# Patient Record
Sex: Female | Born: 1935 | Race: White | Hispanic: No | Smoking: Never smoker
Health system: Southern US, Community
[De-identification: ages and names within clinical notes are randomized; demographics above are authoritative.]

## PROBLEM LIST (undated history)

## (undated) DIAGNOSIS — E785 Hyperlipidemia, unspecified: Secondary | ICD-10-CM

## (undated) DIAGNOSIS — I739 Peripheral vascular disease, unspecified: Secondary | ICD-10-CM

## (undated) DIAGNOSIS — I2510 Atherosclerotic heart disease of native coronary artery without angina pectoris: Secondary | ICD-10-CM

## (undated) DIAGNOSIS — M1990 Unspecified osteoarthritis, unspecified site: Secondary | ICD-10-CM

## (undated) DIAGNOSIS — IMO0002 Reserved for concepts with insufficient information to code with codable children: Secondary | ICD-10-CM

## (undated) DIAGNOSIS — K7689 Other specified diseases of liver: Secondary | ICD-10-CM

## (undated) DIAGNOSIS — M549 Dorsalgia, unspecified: Secondary | ICD-10-CM

## (undated) DIAGNOSIS — I480 Paroxysmal atrial fibrillation: Secondary | ICD-10-CM

## (undated) DIAGNOSIS — A419 Sepsis, unspecified organism: Secondary | ICD-10-CM

## (undated) DIAGNOSIS — L0390 Cellulitis, unspecified: Secondary | ICD-10-CM

## (undated) DIAGNOSIS — N184 Chronic kidney disease, stage 4 (severe): Secondary | ICD-10-CM

## (undated) DIAGNOSIS — K222 Esophageal obstruction: Secondary | ICD-10-CM

## (undated) DIAGNOSIS — I890 Lymphedema, not elsewhere classified: Secondary | ICD-10-CM

## (undated) DIAGNOSIS — G8929 Other chronic pain: Secondary | ICD-10-CM

## (undated) DIAGNOSIS — D649 Anemia, unspecified: Secondary | ICD-10-CM

## (undated) DIAGNOSIS — I5032 Chronic diastolic (congestive) heart failure: Secondary | ICD-10-CM

## (undated) DIAGNOSIS — I10 Essential (primary) hypertension: Secondary | ICD-10-CM

## (undated) DIAGNOSIS — E1140 Type 2 diabetes mellitus with diabetic neuropathy, unspecified: Secondary | ICD-10-CM

## (undated) DIAGNOSIS — M5430 Sciatica, unspecified side: Secondary | ICD-10-CM

## (undated) DIAGNOSIS — C439 Malignant melanoma of skin, unspecified: Secondary | ICD-10-CM

## (undated) HISTORY — PX: ABSCESS DRAINAGE: SHX1119

## (undated) HISTORY — DX: Sciatica, unspecified side: M54.30

## (undated) HISTORY — DX: Essential (primary) hypertension: I10

## (undated) HISTORY — DX: Paroxysmal atrial fibrillation: I48.0

## (undated) HISTORY — DX: Chronic diastolic (congestive) heart failure: I50.32

## (undated) HISTORY — DX: Chronic kidney disease, stage 4 (severe): N18.4

## (undated) HISTORY — DX: Cellulitis, unspecified: L03.90

## (undated) HISTORY — DX: Sepsis, unspecified organism: A41.9

## (undated) HISTORY — DX: Other specified diseases of liver: K76.89

## (undated) HISTORY — DX: Atherosclerotic heart disease of native coronary artery without angina pectoris: I25.10

## (undated) HISTORY — PX: CARDIAC CATHETERIZATION: SHX172

## (undated) HISTORY — DX: Esophageal obstruction: K22.2

## (undated) HISTORY — DX: Type 2 diabetes mellitus with diabetic neuropathy, unspecified: E11.40

## (undated) HISTORY — DX: Hyperlipidemia, unspecified: E78.5

## (undated) HISTORY — PX: OTHER SURGICAL HISTORY: SHX169

## (undated) HISTORY — DX: Unspecified osteoarthritis, unspecified site: M19.90

## (undated) HISTORY — DX: Malignant melanoma of skin, unspecified: C43.9

## (undated) HISTORY — DX: Reserved for concepts with insufficient information to code with codable children: IMO0002

## (undated) SURGERY — CARDIOVERSION
Anesthesia: Monitor Anesthesia Care

---

## 1980-08-06 HISTORY — PX: TUBAL LIGATION: SHX77

## 1987-08-07 HISTORY — PX: OTHER SURGICAL HISTORY: SHX169

## 1992-08-06 HISTORY — PX: ELBOW SURGERY: SHX618

## 1998-06-22 ENCOUNTER — Ambulatory Visit (HOSPITAL_COMMUNITY): Admission: RE | Admit: 1998-06-22 | Discharge: 1998-06-22 | Payer: Self-pay | Admitting: *Deleted

## 1998-07-12 ENCOUNTER — Ambulatory Visit (HOSPITAL_COMMUNITY): Admission: RE | Admit: 1998-07-12 | Discharge: 1998-07-12 | Payer: Self-pay | Admitting: *Deleted

## 1998-07-21 ENCOUNTER — Encounter: Admission: RE | Admit: 1998-07-21 | Discharge: 1998-10-07 | Payer: Self-pay | Admitting: Anesthesiology

## 1998-10-07 ENCOUNTER — Encounter: Admission: RE | Admit: 1998-10-07 | Discharge: 1999-01-05 | Payer: Self-pay | Admitting: Anesthesiology

## 1999-01-05 ENCOUNTER — Encounter: Admission: RE | Admit: 1999-01-05 | Discharge: 1999-03-30 | Payer: Self-pay | Admitting: Anesthesiology

## 1999-03-30 ENCOUNTER — Encounter: Admission: RE | Admit: 1999-03-30 | Discharge: 1999-06-28 | Payer: Self-pay | Admitting: Anesthesiology

## 1999-07-24 ENCOUNTER — Encounter: Admission: RE | Admit: 1999-07-24 | Discharge: 1999-10-22 | Payer: Self-pay | Admitting: Anesthesiology

## 1999-11-13 ENCOUNTER — Encounter: Admission: RE | Admit: 1999-11-13 | Discharge: 2000-01-15 | Payer: Self-pay | Admitting: Anesthesiology

## 2000-01-15 ENCOUNTER — Encounter: Admission: RE | Admit: 2000-01-15 | Discharge: 2000-04-14 | Payer: Self-pay | Admitting: Anesthesiology

## 2000-05-03 ENCOUNTER — Encounter: Admission: RE | Admit: 2000-05-03 | Discharge: 2000-08-01 | Payer: Self-pay | Admitting: Anesthesiology

## 2000-08-23 ENCOUNTER — Encounter: Admission: RE | Admit: 2000-08-23 | Discharge: 2000-11-21 | Payer: Self-pay | Admitting: Anesthesiology

## 2000-12-13 ENCOUNTER — Encounter: Admission: RE | Admit: 2000-12-13 | Discharge: 2001-03-13 | Payer: Self-pay | Admitting: Anesthesiology

## 2001-02-19 ENCOUNTER — Other Ambulatory Visit: Admission: RE | Admit: 2001-02-19 | Discharge: 2001-02-19 | Payer: Self-pay | Admitting: Obstetrics and Gynecology

## 2005-08-06 HISTORY — PX: CORONARY ANGIOPLASTY: SHX604

## 2005-10-19 ENCOUNTER — Ambulatory Visit (HOSPITAL_COMMUNITY): Admission: RE | Admit: 2005-10-19 | Discharge: 2005-10-19 | Payer: Self-pay | Admitting: Anesthesiology

## 2006-02-01 ENCOUNTER — Inpatient Hospital Stay (HOSPITAL_COMMUNITY): Admission: EM | Admit: 2006-02-01 | Discharge: 2006-02-09 | Payer: Self-pay | Admitting: Internal Medicine

## 2006-02-01 ENCOUNTER — Ambulatory Visit: Payer: Self-pay | Admitting: *Deleted

## 2006-02-04 ENCOUNTER — Encounter: Payer: Self-pay | Admitting: Cardiology

## 2006-02-05 ENCOUNTER — Encounter: Payer: Self-pay | Admitting: Cardiology

## 2006-03-05 ENCOUNTER — Ambulatory Visit: Payer: Self-pay | Admitting: Cardiology

## 2006-03-11 ENCOUNTER — Ambulatory Visit: Payer: Self-pay | Admitting: Cardiology

## 2006-03-11 ENCOUNTER — Encounter: Payer: Self-pay | Admitting: Cardiology

## 2006-03-11 ENCOUNTER — Ambulatory Visit: Payer: Self-pay

## 2006-03-19 ENCOUNTER — Ambulatory Visit: Payer: Self-pay

## 2006-05-08 ENCOUNTER — Ambulatory Visit: Payer: Self-pay | Admitting: Cardiology

## 2006-08-19 ENCOUNTER — Ambulatory Visit: Payer: Self-pay | Admitting: Cardiology

## 2006-12-24 ENCOUNTER — Ambulatory Visit: Payer: Self-pay | Admitting: Cardiology

## 2007-06-23 ENCOUNTER — Ambulatory Visit: Payer: Self-pay | Admitting: Cardiology

## 2007-12-31 ENCOUNTER — Ambulatory Visit: Payer: Self-pay | Admitting: Cardiology

## 2009-01-02 DIAGNOSIS — E785 Hyperlipidemia, unspecified: Secondary | ICD-10-CM

## 2009-01-02 DIAGNOSIS — I872 Venous insufficiency (chronic) (peripheral): Secondary | ICD-10-CM

## 2009-01-04 ENCOUNTER — Ambulatory Visit: Payer: Self-pay | Admitting: Cardiology

## 2009-01-10 ENCOUNTER — Encounter: Payer: Self-pay | Admitting: Cardiology

## 2009-01-18 ENCOUNTER — Ambulatory Visit: Payer: Self-pay

## 2009-01-18 ENCOUNTER — Ambulatory Visit: Payer: Self-pay | Admitting: Cardiology

## 2009-01-20 ENCOUNTER — Ambulatory Visit: Payer: Self-pay | Admitting: Cardiology

## 2009-01-21 LAB — CONVERTED CEMR LAB
BUN: 27 mg/dL — ABNORMAL HIGH (ref 6–23)
Calcium: 9.2 mg/dL (ref 8.4–10.5)
Creatinine, Ser: 1.3 mg/dL — ABNORMAL HIGH (ref 0.4–1.2)
GFR calc non Af Amer: 42.62 mL/min (ref >60)
Glucose, Bld: 81 mg/dL (ref 70–99)
Potassium: 3.3 meq/L — ABNORMAL LOW (ref 3.5–5.1)
Sodium: 141 meq/L (ref 135–145)

## 2009-02-04 ENCOUNTER — Ambulatory Visit: Payer: Self-pay | Admitting: Cardiology

## 2009-02-04 LAB — CONVERTED CEMR LAB
BUN: 32 mg/dL — ABNORMAL HIGH (ref 6–23)
Calcium: 9 mg/dL (ref 8.4–10.5)
Chloride: 104 meq/L (ref 96–112)
Glucose, Bld: 106 mg/dL — ABNORMAL HIGH (ref 70–99)
Sodium: 140 meq/L (ref 135–145)

## 2009-05-17 ENCOUNTER — Emergency Department (HOSPITAL_COMMUNITY): Admission: EM | Admit: 2009-05-17 | Discharge: 2009-05-17 | Payer: Self-pay | Admitting: Emergency Medicine

## 2009-05-17 ENCOUNTER — Ambulatory Visit: Payer: Self-pay | Admitting: Surgery

## 2009-05-17 ENCOUNTER — Encounter (INDEPENDENT_AMBULATORY_CARE_PROVIDER_SITE_OTHER): Payer: Self-pay | Admitting: Emergency Medicine

## 2009-06-15 ENCOUNTER — Encounter: Payer: Self-pay | Admitting: Internal Medicine

## 2009-07-05 ENCOUNTER — Ambulatory Visit: Payer: Self-pay | Admitting: Internal Medicine

## 2009-07-05 DIAGNOSIS — K6812 Psoas muscle abscess: Secondary | ICD-10-CM

## 2009-07-05 LAB — CONVERTED CEMR LAB
Bilirubin Urine: NEGATIVE
Hemoglobin, Urine: NEGATIVE
Protein, ur: >300 mg/dL — AB
WBC, UA: NONE SEEN cells/hpf (ref <3)

## 2009-07-07 DIAGNOSIS — K754 Autoimmune hepatitis: Secondary | ICD-10-CM | POA: Insufficient documentation

## 2009-07-07 DIAGNOSIS — K219 Gastro-esophageal reflux disease without esophagitis: Secondary | ICD-10-CM

## 2009-07-07 DIAGNOSIS — E119 Type 2 diabetes mellitus without complications: Secondary | ICD-10-CM | POA: Insufficient documentation

## 2009-07-07 DIAGNOSIS — I10 Essential (primary) hypertension: Secondary | ICD-10-CM | POA: Insufficient documentation

## 2009-07-07 DIAGNOSIS — D638 Anemia in other chronic diseases classified elsewhere: Secondary | ICD-10-CM | POA: Insufficient documentation

## 2009-07-07 DIAGNOSIS — IMO0002 Reserved for concepts with insufficient information to code with codable children: Secondary | ICD-10-CM

## 2009-07-13 ENCOUNTER — Telehealth: Payer: Self-pay | Admitting: Internal Medicine

## 2009-07-13 ENCOUNTER — Encounter: Payer: Self-pay | Admitting: Internal Medicine

## 2009-07-14 ENCOUNTER — Ambulatory Visit: Payer: Self-pay | Admitting: Internal Medicine

## 2009-07-18 ENCOUNTER — Encounter: Payer: Self-pay | Admitting: Internal Medicine

## 2009-07-19 ENCOUNTER — Encounter: Payer: Self-pay | Admitting: Cardiology

## 2009-07-19 ENCOUNTER — Encounter: Payer: Self-pay | Admitting: Internal Medicine

## 2009-07-21 ENCOUNTER — Telehealth: Payer: Self-pay | Admitting: Internal Medicine

## 2009-07-21 ENCOUNTER — Inpatient Hospital Stay (HOSPITAL_COMMUNITY): Admission: AD | Admit: 2009-07-21 | Discharge: 2009-07-23 | Payer: Self-pay | Admitting: Internal Medicine

## 2009-07-21 ENCOUNTER — Ambulatory Visit: Payer: Self-pay | Admitting: Internal Medicine

## 2009-07-21 ENCOUNTER — Encounter: Payer: Self-pay | Admitting: Internal Medicine

## 2009-07-21 DIAGNOSIS — I898 Other specified noninfective disorders of lymphatic vessels and lymph nodes: Secondary | ICD-10-CM | POA: Insufficient documentation

## 2009-07-22 ENCOUNTER — Ambulatory Visit: Payer: Self-pay | Admitting: Internal Medicine

## 2009-07-23 ENCOUNTER — Encounter: Payer: Self-pay | Admitting: Internal Medicine

## 2009-08-04 ENCOUNTER — Encounter: Payer: Self-pay | Admitting: Cardiology

## 2009-08-10 ENCOUNTER — Encounter: Payer: Self-pay | Admitting: Cardiology

## 2009-09-08 ENCOUNTER — Encounter: Payer: Self-pay | Admitting: Cardiology

## 2010-01-11 ENCOUNTER — Ambulatory Visit: Payer: Self-pay | Admitting: Cardiology

## 2010-01-17 ENCOUNTER — Encounter: Payer: Self-pay | Admitting: Cardiology

## 2010-09-03 LAB — CONVERTED CEMR LAB
ALT: 27 units/L (ref 0–35)
Albumin: 3.6 g/dL (ref 3.5–5.2)
Alkaline Phosphatase: 57 units/L (ref 39–117)
BUN: 40 mg/dL
Basophils Absolute: 0 10*3/uL (ref 0.0–0.1)
Basophils Relative: 0.1 % (ref 0.0–3.0)
CO2: 29 meq/L
Calcium: 9.6 mg/dL
Calcium: 9.6 mg/dL (ref 8.4–10.5)
Chloride: 101 meq/L (ref 96–112)
Cholesterol: 311 mg/dL — ABNORMAL HIGH (ref 0–200)
Direct LDL: 187.4 mg/dL
Eosinophils Absolute: 0 10*3/uL (ref 0.0–0.7)
Eosinophils Relative: 0.3 % (ref 0.0–5.0)
Glucose, Bld: 151 mg/dL — ABNORMAL HIGH (ref 70–99)
HCT: 37.3 %
HDL: 75.2 mg/dL (ref >39.00)
LDL Cholesterol: 192 mg/dL
Lymphs Abs: 1.4 10*3/uL (ref 0.7–4.0)
MCV: 94.7 fL (ref 78.0–100.0)
Monocytes Relative: 7.8 % (ref 3.0–12.0)
Platelets: 161 10*3/uL
Potassium: 4.9 meq/L
Potassium: 5.5 meq/L — ABNORMAL HIGH (ref 3.5–5.1)
RBC: 4.13 M/uL
RDW: 13.9 % (ref 11.5–14.6)
Sodium: 140 meq/L
Total Bilirubin: 1 mg/dL (ref 0.3–1.2)
Total CHOL/HDL Ratio: 4
Triglycerides: 313 mg/dL — ABNORMAL HIGH (ref 0.0–149.0)

## 2010-09-07 NOTE — Consult Note (Signed)
Summary: [facility]  [facility]   Imported By: Florinda Marker 09/06/2009 15:04:54  _____________________________________________________________________  External Attachment:    Type:   Image     Comment:   External Document

## 2010-09-07 NOTE — Assessment & Plan Note (Signed)
Summary: yearly  Medications Added * METHADONE HCL 20MG  take two tabs two times a day HUMALOG 100 UNIT/ML SOLN (INSULIN LISPRO (HUMAN)) take 7-10 units three times a day FISH OIL 1000 MG CAPS (OMEGA-3 FATTY ACIDS) take three tabs by mouth once daily CALCIUM 600 1500 MG TABS (CALCIUM CARBONATE) take one tab by mouth two times a day CHROMIUM PICOLINATE 500 MCG TABS (CHROMIUM PICOLINATE) take one tab by mouth once daily WELCHOL 625 MG TABS (COLESEVELAM HCL) take 3 tabs by mouth three times a day FUROSEMIDE 40 MG TABS (FUROSEMIDE) Take one and a half tablets by mouth once daily * VITAMIN C 2000 UNITS take one tab by mouth once daily SELENIUM 200 MCG TABS (SELENIUM) take one tab by mouth once daily * MAGNESIUM take one tab by mouth once daily MULTIVITAMINS  TABS (MULTIPLE VITAMIN) take one tab by mouth once daily      Allergies Added:   Visit Type:  Follow-up Primary Provider:  Dr. Velna Ochs- [place]  CC:  The pt. is without cardiac complaints. She was hospitalized in December 2010 with an abscess and has lower extremity edema. She states her weight is up about 6-7 pounds since December. Her lower extremities are red.Marland Kitchen  History of Present Illness: Tpatient is 77 years old and return for management of CAD. She had a non-ST elevation MI in 2007 and overlapping drug-eluting stents placed to the right coronary artery. It was a complicated procedure attempted by wire perforation in the perineal bleeding but she recovered and did well. She had a negative Myoview scan in 2009.  She has done well the standpoint of her heart has had no recent chest pain shortness of breath or palpitations.  She was hospitalized in December with an abscess in her right femoral region. She also has autoimmune liver disease and is followed at [facility]. She has chronic venous insufficiency of the lower extremities with chronic cellulitis.  Current Medications (verified): 1)  Methadone Hcl 20mg  .... Take Two  Tabs Two Times A Day 2)  Prednisone 10 Mg Tabs (Prednisone) .Marland Kitchen.. 1 Once Daily 3)  Rapamune 1 Mg Tabs (Sirolimus) .... Take One Tablet By Mouth Every Other Day 4)  Zetia 10 Mg Tabs (Ezetimibe) .... Take One Tablet By Mouth Daily. 5)  Bisoprolol Fumarate 5 Mg Tabs (Bisoprolol Fumarate) .... Take One Tablet By Mouth Daily 6)  Aspirin 81 Mg Tbec (Aspirin) .... Take One Tablet By Mouth Daily 7)  Humalog 100 Unit/ml Soln (Insulin Lispro (Human)) .... Take 7-10 Units Three Times A Day 8)  Fish Oil 1000 Mg Caps (Omega-3 Fatty Acids) .... Take Three Tabs By Mouth Once Daily 9)  Calcium 600 1500 Mg Tabs (Calcium Carbonate) .... Take One Tab By Mouth Two Times A Day 10)  Chromium Picolinate 500 Mcg Tabs (Chromium Picolinate) .... Take One Tab By Mouth Once Daily 11)  Welchol 625 Mg Tabs (Colesevelam Hcl) .... Take 3 Tabs By Mouth Three Times A Day 12)  Furosemide 40 Mg Tabs (Furosemide) .... Take One Tablet By Mouth Daily. 13)  Metanx 3-35-2 Mg Tabs (L-Methylfolate-B6-B12) .... Take 1 Tablet By Mouth Two Times A Day 14)  Vitamin C 2000 Units .... Take One Tab By Mouth Once Daily 15)  Selenium 200 Mcg Tabs (Selenium) .... Take One Tab By Mouth Once Daily 16)  Magnesium .... Take One Tab By Mouth Once Daily 17)  Multivitamins  Tabs (Multiple Vitamin) .... Take One Tab By Mouth Once Daily  Allergies (verified): 1)  !  Cephalosporins  Past History:  Past Medical History: Last updated: 07/05/2009 1. Coronary artery disease status post non-ST-elevation myocardial     infarction June 2007 with percutaneous coronary intervention of the     right coronary artery using overlapping drug-eluting stents. 2. Good left ventricular function. 3. Autoimmune liver disease. 4. Hyperlipidemia with inability to take statins due to her liver     disease. 5. Chronic cellulitis of lower extremities. 6. Venous insufficiency of the lower extremities. 7. Cervical lumbar spine disease.  GERD Hypertension Diabetes  mellitus, type II  Vital Signs:  Patient profile:   77 year old female Menstrual status:  postmenopausal Height:      65 inches Weight:      141 pounds Pulse rate:   58 / minute BP sitting:   136 / 62  (left arm) Cuff size:   large  Vitals Entered By: Sherri Rad, RN, BSN (January 11, 2010 2:15 PM)  Physical Exam  Additional Exam:  Gen. Well-nourished, in no distress   Neck: No JVD, thyroid not enlarged, no carotid bruits Lungs: No tachypnea, clear without rales, rhonchi or wheezes Cardiovascular: Rhythm regular, PMI not displaced,  heart sounds  normal, no murmurs or gallops, 1+ bilateral lower extremity peripheral edema with erythema, pulses normal in all 4 extremities. Abdomen: BS normal, abdomen soft and non-tender without masses or organomegaly, no hepatosplenomegaly. MS: No deformities, no cyanosis or clubbing   Neuro:  No focal sns   Skin:  no lesions    Impression & Recommendations:  Problem # 1:  CAD, NATIVE VESSEL (ICD-414.01) She had a remote PCI procedure in 2007. She's had no recent chest pain is probably stable. The following medications were removed from the medication list:    Fosinopril Sodium 10 Mg Tabs (Fosinopril sodium) .Marland Kitchen... Take 1 tablet by mouth two times a day Her updated medication list for this problem includes:    Bisoprolol Fumarate 5 Mg Tabs (Bisoprolol fumarate) .Marland Kitchen... Take one tablet by mouth daily    Aspirin 81 Mg Tbec (Aspirin) .Marland Kitchen... Take one tablet by mouth daily  Orders: EKG w/ Interpretation (93000)  Problem # 2:  HYPERTENSION (ICD-401.9)  This is well controlled on current medications. The following medications were removed from the medication list:    Fosinopril Sodium 10 Mg Tabs (Fosinopril sodium) .Marland Kitchen... Take 1 tablet by mouth two times a day Her updated medication list for this problem includes:    Bisoprolol Fumarate 5 Mg Tabs (Bisoprolol fumarate) .Marland Kitchen... Take one tablet by mouth daily    Aspirin 81 Mg Tbec (Aspirin) .Marland Kitchen... Take one  tablet by mouth daily    Furosemide 40 Mg Tabs (Furosemide) .Marland Kitchen... Take one and a half tablets by mouth once daily  The following medications were removed from the medication list:    Fosinopril Sodium 10 Mg Tabs (Fosinopril sodium) .Marland Kitchen... Take 1 tablet by mouth two times a day Her updated medication list for this problem includes:    Bisoprolol Fumarate 5 Mg Tabs (Bisoprolol fumarate) .Marland Kitchen... Take one tablet by mouth daily    Aspirin 81 Mg Tbec (Aspirin) .Marland Kitchen... Take one tablet by mouth daily    Furosemide 40 Mg Tabs (Furosemide) .Marland Kitchen... Take one tablet by mouth daily.  Problem # 3:  VENOUS INSUFFICIENCY, LEGS (ICD-459.81) She has venous insufficiency and cellulitis of the lower extremities. We will increase her Lasix from 46 mg daily. We will get her BMP results from her primary care physician's office. We will encourage her to use the support  hose.  Patient Instructions: 1)  Your physician has recommended you make the following change in your medication: 1) Increase lasix (furosemide) to 40mg  one and a half tabs by mouth once daily  2)  Your physician wants you to follow-up in: 1 year with Dr. Shirlee Latch.  You will receive a reminder letter in the mail two months in advance. If you don't receive a letter, please call our office to schedule the follow-up appointment. Prescriptions: FUROSEMIDE 40 MG TABS (FUROSEMIDE) Take one and a half tablets by mouth once daily  #45 x 11   Entered by:   Sherri Rad, RN, BSN   Authorized by:   Lenoria Farrier, MD, [facility]   Signed by:   Sherri Rad, RN, BSN on 01/11/2010   Method used:   Electronically to        [facility]. [phone]* (retail)       [address]       [place], [place]  [address]       Ph: [phone] or [phone]       Fax: [phone]   RxID:   [id number] BISOPROLOL FUMARATE 5 MG TABS (BISOPROLOL FUMARATE) Take one tablet by mouth daily  #30 x 11   Entered by:   Sherri Rad, RN, BSN    Authorized by:   Lenoria Farrier, MD, [facility]   Signed by:   Sherri Rad, RN, BSN on 01/11/2010   Method used:   Electronically to        [facility]. [phone]* (retail)       [address]       [place], [place]  [address]       Ph: [phone] or [phone]       Fax: [phone]   RxID:   [id number]

## 2010-09-07 NOTE — Letter (Signed)
Summary: [facility] Note  [facility] Office Note   Imported By: Roderic Ovens 09/23/2009 12:25:29  _____________________________________________________________________  External Attachment:    Type:   Image     Comment:   External Document

## 2010-09-07 NOTE — Miscellaneous (Signed)
Summary: labs  Clinical Lists Changes  Observations: Added new observation of LDL: 192 mg/dL (16/05/9603 54:09) Added new observation of HDL: 71.06 mg/dL (81/19/1478 29:56) Added new observation of TRIGLYC TOT: 781 mg/dL (21/30/8657 84:69) Added new observation of CHOLESTEROL: 378 mg/dL (62/95/2841 32:44) Added new observation of CALCIUM: 9.6 mg/dL (08/08/7251 66:44) Added new observation of SGPT (ALT): 23 units/L (08/04/2009 14:58) Added new observation of SGOT (AST): 25 units/L (08/04/2009 14:58) Added new observation of ALK PHOS: 59 units/L (08/04/2009 14:58) Added new observation of BILI TOTAL: 0.5 mg/dL (03/47/4259 56:38) Added new observation of CREATININE: 1.6 mg/dL (75/64/3329 51:88) Added new observation of BUN: 40 mg/dL (41/66/0630 16:01) Added new observation of BG RANDOM: 105 mg/dL (09/32/3557 32:20) Added new observation of CO2 PLSM/SER: 29 meq/L (08/04/2009 14:58) Added new observation of CL SERUM: 108 meq/L (08/04/2009 14:58) Added new observation of K SERUM: 4.9 meq/L (08/04/2009 14:58) Added new observation of NA: 140 meq/L (08/04/2009 14:58) Added new observation of PLATELETK/UL: 161 K/uL (08/04/2009 14:58) Added new observation of HCT: 37.3 % (08/04/2009 14:58) Added new observation of HGB: 12.3 g/dL (25/42/7062 37:62) Added new observation of RBC M/UL: 4.13 M/uL (08/04/2009 14:58) Added new observation of WBC COUNT: 11.8 10*3/microliter (08/04/2009 14:58)

## 2010-09-07 NOTE — Letter (Signed)
Summary: [facility]  [facility]   Imported By: Roderic Ovens 09/20/2009 14:24:55  _____________________________________________________________________  External Attachment:    Type:   Image     Comment:   External Document

## 2010-09-07 NOTE — Letter (Signed)
Summary: [facility] F/U Visit Note  [facility] F/U Visit Note   Imported By: Roderic Ovens 02/03/2010 09:49:53  _____________________________________________________________________  External Attachment:    Type:   Image     Comment:   External Document

## 2010-11-06 ENCOUNTER — Telehealth: Payer: Self-pay | Admitting: Cardiology

## 2010-11-06 ENCOUNTER — Encounter: Payer: Self-pay | Admitting: Cardiovascular Disease

## 2010-11-06 ENCOUNTER — Ambulatory Visit (INDEPENDENT_AMBULATORY_CARE_PROVIDER_SITE_OTHER): Payer: Medicare Other | Admitting: Cardiovascular Disease

## 2010-11-06 DIAGNOSIS — I2510 Atherosclerotic heart disease of native coronary artery without angina pectoris: Secondary | ICD-10-CM

## 2010-11-06 DIAGNOSIS — R000 Tachycardia, unspecified: Secondary | ICD-10-CM

## 2010-11-06 DIAGNOSIS — I10 Essential (primary) hypertension: Secondary | ICD-10-CM

## 2010-11-06 DIAGNOSIS — R0609 Other forms of dyspnea: Secondary | ICD-10-CM

## 2010-11-06 DIAGNOSIS — R0600 Dyspnea, unspecified: Secondary | ICD-10-CM

## 2010-11-06 DIAGNOSIS — R079 Chest pain, unspecified: Secondary | ICD-10-CM

## 2010-11-06 DIAGNOSIS — R002 Palpitations: Secondary | ICD-10-CM

## 2010-11-06 LAB — CBC
HCT: 30.7 % — ABNORMAL LOW (ref 36.0–46.0)
Hemoglobin: 10.5 g/dL — ABNORMAL LOW (ref 12.0–15.0)
Hemoglobin: 12.5 g/dL (ref 12.0–15.0)
MCV: 90.1 fL (ref 78.0–100.0)
RBC: 3.39 MIL/uL — ABNORMAL LOW (ref 3.87–5.11)
RBC: 4.07 MIL/uL (ref 3.87–5.11)
RDW: 18.5 % — ABNORMAL HIGH (ref 11.5–15.5)
RDW: 18.7 % — ABNORMAL HIGH (ref 11.5–15.5)
WBC: 7.8 10*3/uL (ref 4.0–10.5)

## 2010-11-06 LAB — BASIC METABOLIC PANEL
CO2: 26 mEq/L (ref 19–32)
Calcium: 9.3 mg/dL (ref 8.4–10.5)
Creatinine, Ser: 1.43 mg/dL — ABNORMAL HIGH (ref 0.4–1.2)
GFR calc Af Amer: 45 mL/min — ABNORMAL LOW (ref >60)
GFR calc non Af Amer: 36 mL/min — ABNORMAL LOW (ref >60)
GFR calc non Af Amer: 37 mL/min — ABNORMAL LOW (ref >60)
Glucose, Bld: 135 mg/dL — ABNORMAL HIGH (ref 70–99)
Potassium: 4.1 mEq/L (ref 3.5–5.1)
Potassium: 5.1 mEq/L (ref 3.5–5.1)
Sodium: 139 mEq/L (ref 135–145)

## 2010-11-06 LAB — HEPATIC FUNCTION PANEL
AST: 36 U/L (ref 0–37)
Albumin: 3.1 g/dL — ABNORMAL LOW (ref 3.5–5.2)
Bilirubin, Direct: <0.1 mg/dL (ref 0.0–0.3)
Total Bilirubin: 0.5 mg/dL (ref 0.3–1.2)
Total Protein: 6.9 g/dL (ref 6.0–8.3)

## 2010-11-06 LAB — GLUCOSE, CAPILLARY
Glucose-Capillary: 157 mg/dL — ABNORMAL HIGH (ref 70–99)
Glucose-Capillary: 216 mg/dL — ABNORMAL HIGH (ref 70–99)
Glucose-Capillary: 216 mg/dL — ABNORMAL HIGH (ref 70–99)
Glucose-Capillary: 85 mg/dL (ref 70–99)

## 2010-11-06 NOTE — Assessment & Plan Note (Signed)
with reported tachycardia at home. HR is fine now. Will check routine labs today including CBC, CMP, TSH, D-dimer and TnI. Will also order a 2 D echo to look for other cardiac abnormalities. Will increase Bisoprolol to 5 mg bid for now. She will monitor her BP/HR.

## 2010-11-06 NOTE — Progress Notes (Signed)
HPI  The patient requested to be seen urgently due to tachycardia. She is a patient of Dr. Juanda Chance. She was last seen in June of 2011. She checked her BP today and found that her heart rate was 110. She became very anxious and HR increased more. She felt palpitations but no chest pain or dyspnea. She did have a brief episode of sharp right sided chest pain last night. The palpitations were no associated with dizziness, syncope or presyncope. She did have similar episodes in the past. She has been doing reasonably well until this event. No other associated symptoms. No signs of infection or bleeding. No thyroid disease.   Allergies  Allergen Reactions  . Cephalosporins      No current outpatient prescriptions on file prior to visit.     Past Medical History  Diagnosis Date  . MI (myocardial infarction) 01/2003  . Chest pain 11/05/2010  . Rapid heart rate 11/04/2010  . Hypertension   . Arthritis   . Diabetes mellitus   . Coronary artery disease 2007    NSTEMI  . Autoimmune Liver Disease   . Hyperlipidemia     inability to stake statins due to liver disease  . Cellulitis     chronic cellulitis of LE  . Chronic kidney disease      Past Surgical History  Procedure Date  . Cardiac catheterization     Normal EF  . Coronary angioplasty 2007    2 DES placement to RCA complicated by a wire perforation     Family History  Problem Relation Age of Onset  . Cancer Mother 64  . Heart failure Father 80     History   Social History  . Marital Status: Married    Spouse Name: N/A    Number of Children: N/A  . Years of Education: N/A   Occupational History  . Not on file.   Social History Main Topics  . Smoking status: Never Smoker   . Smokeless tobacco: Not on file  . Alcohol Use: No  . Drug Use: No  . Sexually Active:    Other Topics Concern  . Not on file   Social History Narrative  . No narrative on file     ROS Negative other than what is mentioned in HPI.    PHYSICAL EXAM   BP 150/80  Pulse 92  Resp 16  Wt 136 lb (61.689 kg)  SpO2 96%  Constitutional:Sheis oriented to person, place, and time. She appears well-developed and well-nourished. No distress.  HENT:  Head: Normocephalic and atraumatic.  Eyes: Pupils are equal, round, and reactive to light. Right eye exhibits no discharge. Left eye exhibits no discharge.  Neck: Normal range of motion. Neck supple. No JVD present. No thyromegaly present.  Cardiovascular: Normal rate, regular rhythm, normal heart sounds and intact distal pulses. Exam reveals no gallop and no friction rub.  No murmur heard.  Pulmonary/Chest: Effort normal and breath sounds normal. No stridor. No respiratory distress. He has no wheezes. He has no rales. He exhibits no tenderness.  Abdominal: Soft. Bowel sounds are normal.  no distension. There is no tenderness. There is no rebound and no guarding.  Musculoskeletal: Normal range of motion.  no edema and no tenderness.  Neurological: She is alert and oriented to person, place, and time. Coordination normal.  Skin: Skin is warm and dry. No rash noted. She is not diaphoretic. No erythema. No pallor.  Psychiatric: She has a normal mood and affect. Her behavior  is normal. Judgment and thought content normal.      EKG: NSR (HR 92 bpm), LAFB. No acute changes.   ASSESSMENT AND PLAN

## 2010-11-06 NOTE — Telephone Encounter (Signed)
Spoke with pt's husband who reports pt had increased heart rate in 80-95 range over weekend with one increase up to 112. This occurs with activity. At rest heart rate is in 70's. Blood pressure is OK.  Pt is feeling more tired than usual but no  shortness of breath, fever or other complaints. Heart rate is regular with occasional skips but per husband this is normal for pt and there has been no increase of skipped beats.   Pt taking prescribed ASA and bisoprolol 5 mg daily.  I told pt I would forward note to Dr. Shirlee Latch to review and see if meds need to be adjusted or pt needs appt. Pt due to be seen in June and would like earlier appt if no med changes made. Will forward to [facility] for appt follow up.  Husband agreeable with this plan.

## 2010-11-06 NOTE — Assessment & Plan Note (Signed)
No clear evidence of angina but she did have 1 episode of atypical chest pain last night. Will check TnI. She will likely need a stress test once other work up is complete.

## 2010-11-06 NOTE — Assessment & Plan Note (Signed)
Her BP is elevated today. Could be due to anxiety. Will increase Bisoprolol to 5 mg bid. Will change it to 10 mg daily upon follow up.

## 2010-11-07 ENCOUNTER — Telehealth: Payer: Self-pay | Admitting: *Deleted

## 2010-11-07 ENCOUNTER — Telehealth: Payer: Self-pay | Admitting: Cardiovascular Disease

## 2010-11-07 NOTE — Telephone Encounter (Signed)
Pt was seen by Dr Kirke Corin  in the [facility] office 11/06/10. See office note by Dr Kary Kos.

## 2010-11-07 NOTE — Telephone Encounter (Signed)
Faxed order for VQ scan,chest X-Ray, and bilateral  venous doppler to [phone] [facility].

## 2010-11-07 NOTE — Telephone Encounter (Signed)
T.C. To patient-notified her of appointments for VQ scan of lungs and Bilateral Venous Doppler today at 2pm-to arrive at 1:30pm.  Voiced understanding.

## 2010-11-08 ENCOUNTER — Encounter: Payer: Self-pay | Admitting: Cardiovascular Disease

## 2010-11-09 ENCOUNTER — Encounter (INDEPENDENT_AMBULATORY_CARE_PROVIDER_SITE_OTHER): Payer: Medicare Other | Admitting: Cardiovascular Disease

## 2010-11-09 ENCOUNTER — Encounter: Payer: Self-pay | Admitting: Cardiovascular Disease

## 2010-11-09 DIAGNOSIS — R002 Palpitations: Secondary | ICD-10-CM

## 2010-11-09 LAB — URINALYSIS, ROUTINE W REFLEX MICROSCOPIC
Glucose, UA: NEGATIVE mg/dL
Hgb urine dipstick: NEGATIVE
Protein, ur: NEGATIVE mg/dL
pH: 6 (ref 5.0–8.0)

## 2010-11-09 LAB — URINE MICROSCOPIC-ADD ON

## 2010-11-09 LAB — GLUCOSE, CAPILLARY: Glucose-Capillary: 126 mg/dL — ABNORMAL HIGH (ref 70–99)

## 2010-11-09 LAB — URINE CULTURE: Colony Count: 35000

## 2010-11-13 ENCOUNTER — Encounter: Payer: Self-pay | Admitting: Cardiovascular Disease

## 2010-11-16 ENCOUNTER — Ambulatory Visit (INDEPENDENT_AMBULATORY_CARE_PROVIDER_SITE_OTHER): Payer: Medicare Other | Admitting: Cardiovascular Disease

## 2010-11-16 ENCOUNTER — Encounter: Payer: Self-pay | Admitting: Cardiovascular Disease

## 2010-11-16 DIAGNOSIS — I2510 Atherosclerotic heart disease of native coronary artery without angina pectoris: Secondary | ICD-10-CM

## 2010-11-16 DIAGNOSIS — R002 Palpitations: Secondary | ICD-10-CM

## 2010-11-16 DIAGNOSIS — R0902 Hypoxemia: Secondary | ICD-10-CM

## 2010-11-16 NOTE — Assessment & Plan Note (Signed)
This is mainly exertional and associated with dyspnea and sinus tachycardia. Initially, her O2 saturation was 84%. However, after 10 min of rest, this went up to 96%. I walked with the patient for about 50 feet at a normal pace. Her initial O2 sat was 96% with a HR of 92 bpm. Her saturation gradually decreased with walking and HR increased. At the end her O2 sat was 88% and HR was 117 bpm.  The etiology of this is not clear. She doesn't have any previous pulmonary disease or smoking history. Her only new medication is Calcitonin spray which is unlikely to cause this. She has been on Rapamune for her liver disease which can lead to interstitial lung disease.  Other etiologies include heart failure (unlikely presentation) or right to left shunting.  At this time, I would like to be seen by pulmonary clinic for consultation with complete pulmonary function tests and possible an ABG.  Further cardiac up will depend on their findings. I will consider a TEE given that quality of TTE was really suboptimal. Also a right and left heart catheterization might be ultimately needed. However, the patient is very concerned about any invasive evaluation due to complications during her previous cardiac cath and angioplasty.

## 2010-11-16 NOTE — Progress Notes (Signed)
Patient scheduled for appointment with [facility] on Friday 11/17/10 at 10:15am Dx: dyspnea/hypoxia.  Patient notified.

## 2010-11-16 NOTE — Assessment & Plan Note (Signed)
This seems to be due to exertional sinus tachycardia. No evidence of arrhythmia so far. Holter monitor results are still pending.

## 2010-11-16 NOTE — Progress Notes (Signed)
HPI  Cynthia Hicks is a 77 y/o female who is here for a follow up visit. She was recently seen for symptoms of exertional tachycardia, dyspnea and fatigue. No chest pain. Her heart rate goes up to 120-130 with regular activities. This has been going on for only the last 2 weeks. She was not hypoxic during last visit. I ordered labs on her which overall were unremarkable except for elevated D-Dimer. There was no anemia or elevated cardiac enzymes. Her Creatinine was mildly elevated but close to her baseline as she does have chronic kidney disease. Due to that I elected not to perform a CTA but proceeded with a V-Q scan which was done at [facility] and came back low probability for pulmonary embolus. A bilateral LE venous doppler showed no evidence of DVT. An echocardiogram was performed which overall was sub optimal. This was reviewed by me. It showed normal LVSF, mild diastolic dysfunction with only trace valvular regurgitation. There was no evidence of pulmonary hypertension. However, the right side was not well visualized.  She returns today with no improvement in her symptoms although she does not very terribly bad. She does seem to get dyspnea with minimal activities. That was noted when she entered the exam room and was found to be hypoxic. This improved quickly after a short rest.   Allergies  Allergen Reactions  . Cephalosporins      Current Outpatient Prescriptions on File Prior to Visit  Medication Sig Dispense Refill  . Ascorbic Acid (VITAMIN C) 1000 MG tablet Take 1,000 mg by mouth 2 (two) times daily.        Marland Kitchen aspirin 81 MG tablet Take 81 mg by mouth every other day.       . bisoprolol (ZEBETA) 5 MG tablet Take 5 mg by mouth 2 (two) times daily.       . calcium carbonate 200 MG capsule Take 1,000 mg by mouth 2 (two) times daily with a meal.        . Cholecalciferol (VITAMIN D PO) Take 5,000 Units by mouth daily.        . colesevelam (WELCHOL) 625 MG tablet Take 1,875 mg by mouth 2  (two) times daily with a meal. To take 3 tablets twice a day.       . ezetimibe (ZETIA) 10 MG tablet Take 10 mg by mouth daily.        . fish oil-omega-3 fatty acids 1000 MG capsule Take 2 g by mouth 2 (two) times daily.       . furosemide (LASIX) 40 MG tablet Take 40 mg by mouth daily.        Marland Kitchen L-Methylfolate-B6-B12 (METANX) 3-35-2 MG TABS Take 1 tablet by mouth 2 (two) times daily.        . methadone (DOLOPHINE) 10 MG tablet Take 10 mg by mouth 2 (two) times daily.        . predniSONE (DELTASONE) 10 MG tablet Take 10 mg by mouth daily.        . sirolimus (RAPAMUNE) 1 MG tablet Take 1 mg by mouth every other day.           Past Medical History  Diagnosis Date  . MI (myocardial infarction) 01/2003  . Rapid heart rate 11/04/2010  . Hypertension   . Arthritis   . Diabetes mellitus   . Coronary artery disease 2007    NSTEMI  . Autoimmune Liver Disease   . Hyperlipidemia     inability to stake statins  due to liver disease  . Cellulitis     chronic cellulitis of LE  . Chronic kidney disease      Past Surgical History  Procedure Date  . Cardiac catheterization     Normal EF  . Coronary angioplasty 2007    2 DES placement to RCA complicated by a wire perforation     Family History  Problem Relation Age of Onset  . Cancer Mother 57  . Heart failure Father 54     History   Social History  . Marital Status: Married    Spouse Name: N/A    Number of Children: N/A  . Years of Education: N/A   Occupational History  . Not on file.   Social History Main Topics  . Smoking status: Never Smoker   . Smokeless tobacco: Not on file  . Alcohol Use: No  . Drug Use: No  . Sexually Active:    Other Topics Concern  . Not on file   Social History Narrative  . No narrative on file       PHYSICAL EXAM   BP 163/81  Pulse 92  Wt 135 lb (61.236 kg)  SpO2 84%  Constitutional: She is oriented to person, place, and time. She appears well-developed and well-nourished. No  distress.  HENT: No nasal discharge.  Head: Normocephalic and atraumatic.  Eyes: Pupils are equal, round, and reactive to light. Right eye exhibits no discharge. Left eye exhibits no discharge.  Neck: Normal range of motion. Neck supple. No JVD present. No thyromegaly present.  Cardiovascular: Normal rate, regular rhythm, normal heart sounds and intact distal pulses. Exam reveals no gallop and no friction rub.  No murmur heard.  Pulmonary/Chest: Effort normal and breath sounds normal. No stridor. No respiratory distress. She has no wheezes. She has no rales. She exhibits no tenderness.  Abdominal: Soft. Bowel sounds are normal. She exhibits no distension. There is no tenderness. There is no rebound and no guarding.  Musculoskeletal: Normal range of motion. She exhibits no edema and no tenderness.  Neurological: She is alert and oriented to person, place, and time. Coordination normal.  Skin: Skin is warm and dry. No rash noted. She is not diaphoretic. No erythema. No pallor.  Psychiatric: She has a normal mood and affect. Her behavior is normal. Judgment and thought content normal.      ASSESSMENT AND PLAN

## 2010-11-16 NOTE — Assessment & Plan Note (Signed)
No clear evidence of angina at this time. Will decide of further work up after her pulmonary evaluation.

## 2010-11-17 ENCOUNTER — Encounter: Payer: Self-pay | Admitting: Pulmonary Disease

## 2010-11-17 ENCOUNTER — Other Ambulatory Visit: Payer: Self-pay | Admitting: Cardiovascular Disease

## 2010-11-17 ENCOUNTER — Ambulatory Visit (INDEPENDENT_AMBULATORY_CARE_PROVIDER_SITE_OTHER): Payer: Medicare Other | Admitting: Pulmonary Disease

## 2010-11-17 VITALS — BP 126/64 | HR 85 | Temp 98.1°F | Ht 63.5 in | Wt 133.6 lb

## 2010-11-17 DIAGNOSIS — R0902 Hypoxemia: Secondary | ICD-10-CM

## 2010-11-17 MED ORDER — BISOPROLOL FUMARATE 5 MG PO TABS: 5.0000 mg | ORAL_TABLET | Freq: Two times a day (BID) | ORAL | Status: DC

## 2010-11-17 NOTE — Patient Instructions (Signed)
Please call your cardiologist today, and let them know you are not taking bystolic. Will hold off on any further pulmonary workup until we see how you do back on your usual medications. Would be happy to see you again for further evaluation if you continue to have a problem.

## 2010-11-17 NOTE — Progress Notes (Signed)
  Subjective:    Patient ID: Cynthia Hicks, female    DOB: 04-21-1933, 77 y.o.   MRN: [id number]  HPI The pt is a 77y/o female who I have been asked to see for possible exertional hypoxemia.  She was recently noted to have desats to 88% with ambulation and significant exertional tachy at OV with Dr. Kirke Corin.  She has not noticed worsening sob, and leads a fairly active lifestyle.  She denies sob with ADL's, bringing groceries in from the car.  She does note elevated heart rate at times.  She denies any cough, congestion, mucus, and has never smoked.  She does have issues with mild LE edema.  She has had a cardiac and some pulmonary w/u with nothing of significance being found.  See overview for w/u that I have reviewed personally.   Review of Systems  Constitutional: Negative for fever and unexpected weight change.  HENT: Positive for trouble swallowing and dental problem. Negative for ear pain, nosebleeds, congestion, sore throat, rhinorrhea, sneezing, postnasal drip and sinus pressure.   Eyes: Negative for redness and itching.  Respiratory: Negative for cough, chest tightness, shortness of breath and wheezing.   Cardiovascular: Positive for palpitations and leg swelling.  Gastrointestinal: Negative for nausea and vomiting.  Genitourinary: Negative for dysuria.  Musculoskeletal: Positive for joint swelling.  Skin: Negative for rash.  Neurological: Negative for headaches.  Hematological: Does not bruise/bleed easily.  Psychiatric/Behavioral: Negative for dysphoric mood. The patient is not nervous/anxious.        Objective:   Physical Exam Constitutional:  Well developed, no acute distress  HENT:  Nares patent without discharge  Oropharynx without exudate, palate and uvula are normal  Eyes:  Perrla, eomi, no scleral icterus  Neck:  No JVD, no TMG  Cardiovascular:  Normal rate, regular rhythm, no rubs or gallops.  No murmurs        Intact distal pulses  Pulmonary :  Normal breath  sounds, no stridor or respiratory distress   No rales, rhonchi, or wheezing  Abdominal:  Soft, nondistended, bowel sounds present.  No tenderness noted.   Musculoskeletal: + extremity edema noted, but erythema/warmth RLE.  Lymph Nodes:  No cervical lymphadenopathy noted  Skin:  No cyanosis noted  Neurologic:  Alert, appropriate, moves all 4 extremities without obvious deficit.         Assessment & Plan:

## 2010-11-17 NOTE — Assessment & Plan Note (Addendum)
The pt had oxygen desat yesterday with ambulation, but today maintains great saturations even with very brisk walking.  She did have significant increase in her heart rate though.  I wonder if value yesterday spurious?, since she denies any dyspnea with exertional activity.  At the end of the visit, she tells me she stopped bystolic 3 weeks ago suddenly, and started back on monopril.  I think she needs to get with her cardiologist to let him know, then see how she does.  I would be happy to evaluate further if it is found she has a desaturation problem.  I suspect she does not have a lung issue, but I wonder if she could have a shunt, especially considering her underlying liver disease? If she has continued issues, would do full pfts and consider TCD bubble study to evaluate for shunt.  She or Dr. Kirke Corin is to let me know if we need to proceed with further pulmonary evaluation.

## 2010-11-23 ENCOUNTER — Encounter: Payer: Self-pay | Admitting: Cardiovascular Disease

## 2010-11-23 ENCOUNTER — Ambulatory Visit (INDEPENDENT_AMBULATORY_CARE_PROVIDER_SITE_OTHER): Payer: Medicare Other | Admitting: Cardiovascular Disease

## 2010-11-23 DIAGNOSIS — I2510 Atherosclerotic heart disease of native coronary artery without angina pectoris: Secondary | ICD-10-CM

## 2010-11-23 DIAGNOSIS — R002 Palpitations: Secondary | ICD-10-CM

## 2010-11-23 NOTE — Progress Notes (Signed)
HPI  Cynthia Hicks returns for a follow up visit. Her tachycardiac resolved after resuming Bioprolol. It turned out that she ran out of Bisoprolol. She started taking Prinivil and thought it was the same. No dyspnea or chest pain.   Allergies  Allergen Reactions  . Cephalosporins      Current Outpatient Prescriptions on File Prior to Visit  Medication Sig Dispense Refill  . Ascorbic Acid (VITAMIN C) 1000 MG tablet Take 1,000 mg by mouth 2 (two) times daily.        Marland Kitchen aspirin 81 MG tablet Take 81 mg by mouth every other day.       . bisoprolol (ZEBETA) 5 MG tablet Take 1 tablet (5 mg total) by mouth 2 (two) times daily.  60 tablet  6  . calcitonin, salmon, (MIACALCIN/FORTICAL) 200 UNIT/ACT nasal spray Take 1 spray by mouth every other day. Alternating nostrils      . calcium carbonate 200 MG capsule Take 1,000 mg by mouth 2 (two) times daily with a meal.        . Cholecalciferol (VITAMIN D PO) Take 5,000 Units by mouth daily.        . Chromium 500 MCG TABS Take 1 tablet by mouth daily.        . colesevelam (WELCHOL) 625 MG tablet Take 1,875 mg by mouth 2 (two) times daily with a meal. (take a total of 3 tabs by mouth twice a day)      . ezetimibe (ZETIA) 10 MG tablet Take 10 mg by mouth daily.        . finasteride (PROSCAR) 5 MG tablet Take 1/2 tab by mouth daily      . fish oil-omega-3 fatty acids 1000 MG capsule Take 2 g by mouth 2 (two) times daily.       . furosemide (LASIX) 40 MG tablet Take 40 mg by mouth daily.        Marland Kitchen L-Methylfolate-B6-B12 (METANX) 3-35-2 MG TABS Take 1 tablet by mouth 2 (two) times daily.        . methadone (DOLOPHINE) 10 MG tablet Take 10 mg by mouth 2 (two) times daily.        . Multiple Vitamin (MULTIVITAMIN) capsule Take 1 capsule by mouth daily.        . predniSONE (DELTASONE) 10 MG tablet Take 10 mg by mouth daily.        . Selenium 200 MCG CAPS Take 1 capsule by mouth daily.        . sirolimus (RAPAMUNE) 1 MG tablet Take 1 mg by mouth every other day.            Past Medical History  Diagnosis Date  . MI (myocardial infarction) 01/2003  . Rapid heart rate 11/04/2010  . Hypertension   . Arthritis   . Diabetes mellitus   . Coronary artery disease 2007    NSTEMI  . Autoimmune Liver Disease   . Hyperlipidemia     inability to stake statins due to liver disease  . Cellulitis     chronic cellulitis of LE  . Chronic kidney disease   . Bulging disc   . Sciatica      Past Surgical History  Procedure Date  . Cardiac catheterization     Normal EF  . Coronary angioplasty 2007    2 DES placement to RCA complicated by a wire perforation  . Back surgery 1992  . Tubal ligation 1982     Family History  Problem Relation  Age of Onset  . Cancer Mother 6    lung and pancreatic  . Heart failure Father 64    (m.i.)  . Heart disease Maternal Grandfather      History   Social History  . Marital Status: Married    Spouse Name: John    Number of Children: N/A  . Years of Education: N/A   Occupational History  . real Therapist, occupational     retired   Social History Main Topics  . Smoking status: Never Smoker   . Smokeless tobacco: Not on file  . Alcohol Use: No  . Drug Use: No  . Sexually Active:    Other Topics Concern  . Not on file   Social History Narrative  . No narrative on file     ROS Constitutional: Negative for fever, chills, diaphoresis, activity change, appetite change and fatigue.  HENT: Negative for hearing loss, nosebleeds, congestion, sore throat, facial swelling, drooling, trouble swallowing, neck pain, voice change, sinus pressure and tinnitus.  Eyes: Negative for photophobia, pain, discharge and visual disturbance.  Respiratory: Negative for apnea, cough, chest tightness, shortness of breath and wheezing.  Cardiovascular: Negative for chest pain, palpitations and leg swelling.  Gastrointestinal: Negative for nausea, vomiting, abdominal pain, diarrhea, constipation, blood in stool and abdominal distention.   Genitourinary: Negative for dysuria, urgency, frequency, hematuria and decreased urine volume.  Musculoskeletal: Negative for myalgias, back pain, joint swelling, arthralgias and gait problem.  Skin: Negative for color change, pallor, rash and wound.  Neurological: Negative for dizziness, tremors, seizures, syncope, speech difficulty, weakness, light-headedness, numbness and headaches.  Psychiatric/Behavioral: Negative for suicidal ideas, hallucinations, behavioral problems and agitation. The patient is not nervous/anxious.     PHYSICAL EXAM   BP 145/68  Pulse 66  Ht 5\' 3"  (1.6 m)  Wt 135 lb (61.236 kg)  BMI 23.91 kg/m2  SpO2 95%  Constitutional: She is oriented to person, place, and time. She appears well-developed and well-nourished. No distress.  HENT: No nasal discharge.  Head: Normocephalic and atraumatic.  Eyes: Pupils are equal, round, and reactive to light. Right eye exhibits no discharge. Left eye exhibits no discharge.  Neck: Normal range of motion. Neck supple. No JVD present. No thyromegaly present.  Cardiovascular: Normal rate, regular rhythm, normal heart sounds and intact distal pulses. Exam reveals no gallop and no friction rub.  No murmur heard.  Pulmonary/Chest: Effort normal and breath sounds normal. No stridor. No respiratory distress. She has no wheezes. She has no rales. She exhibits no tenderness.  Abdominal: Soft. Bowel sounds are normal. She exhibits no distension. There is no tenderness. There is no rebound and no guarding.  Musculoskeletal: Normal range of motion. She exhibits no edema and no tenderness.  Neurological: She is alert and oriented to person, place, and time. Coordination normal.  Skin: Skin is warm and dry. No rash noted. She is not diaphoretic. No erythema. No pallor.  Psychiatric: She has a normal mood and affect. Her behavior is normal. Judgment and thought content normal.      ASSESSMENT AND PLAN

## 2010-11-23 NOTE — Assessment & Plan Note (Signed)
She is not having any symptoms suggestive of angina at this time. Will continue medical therapy.

## 2010-11-23 NOTE — Assessment & Plan Note (Signed)
Due to sinus tachycardia and short runs of SVT likely due to withdrawal from a beta blocker. Her symptoms resolved after resuming Bisoprolol. This can be switched to 10 mg daily instead of 5 mg bid.

## 2010-11-27 ENCOUNTER — Encounter: Payer: Self-pay | Admitting: Cardiovascular Disease

## 2010-12-04 ENCOUNTER — Encounter: Payer: Self-pay | Admitting: Pulmonary Disease

## 2010-12-19 NOTE — Assessment & Plan Note (Signed)
[facility]                            CARDIOLOGY OFFICE NOTE   NAME:ELLISTiffaney, Heimann                       MRN:          [id number]  DATE:06/23/2007                            DOB:          August 02, 1933    PRIMARY CARE PHYSICIAN:  Dr. Sharyne Peach in [place].   CLINICAL HISTORY:  Ms. Spearing is 77 years old and returns for management  of her coronary heart disease.  In July 2007, she had tandem overlapping  TAXUS stents placed in the mid right coronary artery.  She had residual  80% stenosis in the LAD which has been managed medically.  Her initial  procedure was complicated by wire perforation and then a retroperitoneal  hematoma but she recovered and she has been stable from the standpoint  of her heart since that time with no recent chest pain, shortness of  breath, or palpitations.  Her biggest problem is related to edema of her  lower extremities felt to be related to venous insufficiency and  associated cellulitis.  Dr. Steva Ready has finished treating her with 3  weeks of Cipro about a week or two ago.   PAST MEDICAL HISTORY:  Significant for:  1. Autoimmune liver disease for which she is followed at [facility] and for      which she takes prednisone and Rapamune.  2. She also has insulin-dependent diabetes.  3. 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  4. Chronic cellulitis of the lower extremities.   CURRENT MEDICATIONS:  Bisoprolol, insulin, Rapamune, Zetia, Welchol,  Metanx for neuropathy, methadone, Lasix, aspirin, prednisone, Lovaza  which is fish oil.   PHYSICAL EXAMINATION:  VITAL SIGNS:  Blood pressure 140/55 and the pulse  70 and regular.  NECK:  There was no venous distention.  The carotid pulses were full  without bruits.  CHEST:  Clear.  CARDIAC:  Rhythm  was regular.  I could hear no murmurs or gallops.  ABDOMEN:  Soft with normal bowel sounds.  EXTREMITIES:  Peripheral pulses were full.  There was 1 plus edema of  the lower extremities.  There was erythema in the pretibial area  bilaterally.   Electrocardiogram showed left axis deviation and lateral ST-T changes  and had not changed.   IMPRESSION:  1. Coronary artery disease, status post non-ST elevation myocardial      infarction, June 2007, with treatment of a right coronary artery      lesion with overlapping TAXUS stents and residual 80% stenosis in      the left anterior descending artery, now stable.  2. Good left ventricular function.  3. Autoimmune liver disease.  4. Cervical spine disease.  5. Hyperlipidemia with inability to take statins.  6. Chronic cellulitis of the lower extremities.  7. Venous insufficiency of the lower extremities.   RECOMMENDATIONS:  I think Ms. Boehlke is doing fairly well from the  standpoint of her heart.  Her edema in the legs is really not too bad  today.  Dr. Steva Ready discussed the possibility of referral for a Unna boot  but her situation is a little bit  better right now.  Unfortunately, we  will not be able to get her anywhere close to target on her cholesterol  values because of her intolerance to Statins.  We will plan to continue  her current medications and I will see her back in 6 months.     Bruce Elvera Lennox Juanda Chance, MD, [facility]  Electronically Signed    BRB/MedQ  DD: 06/23/2007  DT: 06/23/2007  Job #: [id number]

## 2010-12-19 NOTE — Assessment & Plan Note (Signed)
[facility]                            CARDIOLOGY OFFICE NOTE   NAME:ELLISWoodrow, Dulski                       MRN:          [id number]  DATE:12/31/2007                            DOB:          [birth year]    PRIMARY CARE PHYSICIAN:  Sharyne Peach, MD in [facility]   CLINICAL HISTORY:  Ms. Hellums is 77 years old and returned for follow up  management of coronary heart disease.  In July 2007, she had tandem  overlapping Taxus stents placed in the mid right coronary artery with  residual 80% LAD disease.  Her procedure was complicated by wire  perforation and retroperitoneal hemorrhage, but she recovered well and  has done well since then from the standpoint of her heart.   She has been able to lose about 20 pounds and has been feeling much  better.  She has had no chest pain, shortness breath or palpitations.   PAST MEDICAL HISTORY:  1. Significant for autoimmune liver disease which is followed by Dr.      Katrinka Blazing at [facility].  2. She also has independent diabetes.  3. She has cervical spine disease and lumbar spine disease for which      she takes methadone.  4. She also has chronic cellulitis of the lower extremities.   CURRENT MEDICATIONS:  1. Bisoprolol 5 mg daily.  2. Insulin.  3. Zetia.  4. Welchol.  5. Methadone.  6. Lasix 40 mg p.r.n.  7. Aspirin.  8. Prednisone.  9. Fish oil.  10.Rapamune.   PHYSICAL EXAMINATION:  VITAL SIGNS:  Blood pressure 120/61, pulse and 61  regular.  NECK:  There was no venous distension.  The carotid pulses were full  without bruits.  CHEST:  Clear.  CARDIAC:  Rhythm was regular.  I could hear no murmurs or gallops.  ABDOMEN:  Soft without organomegaly.  EXTREMITIES:  Peripheral pulses were full and equal.  There was 1+ edema  in the lower extremity with some erythema.   IMPRESSION:  1. Coronary artery disease status post non-ST-elevation myocardial      infarction June 2007 with percutaneous  coronary intervention of the      right coronary artery using overlapping drug-eluting stents.  2. Good left ventricular function.  3. Autoimmune liver disease.  4. Hyperlipidemia with inability to take statins due to her liver      disease.  5. Chronic cellulitis of lower extremities.  6. Venous insufficiency of the lower extremities.  7. Cervical lumbar spine disease.   RECOMMENDATIONS:  I think Ms. Mohrmann is doing amazingly well.  She has  lost a lot of weight and this seems to have made a big difference.  Her  cholesterols have not been good partially because she cannot take  statins and partially because her drugs for her liver disease interfere  with her cholesterol.  Her heart situation is stable.  I plan to see her  back in followup in a year.     Bruce Elvera Lennox Juanda Chance, MD, [facility]  Electronically Signed    BRB/MedQ  DD: 12/31/2007  DT: 12/31/2007  Job #:  [id number] 

## 2010-12-19 NOTE — Assessment & Plan Note (Signed)
[name]                            CARDIOLOGY OFFICE NOTE   NAME:Cynthia Hicks, Cynthia Hicks                       MRN:          [id number]  DATE:12/24/2006                            DOB:          12-22-1932    PRIMARY CARE PHYSICIAN:  Sharyne Peach, in [place].   CLINICAL HISTORY:  [name] is 77 years old and has coronary heart  disease.  She was hospitalized with unstable angina last June and  underwent placement of tandem overlapping stents in the right coronary  artery.  Procedure was complicated by distal wire perforation requiring  distal embolization.  She also developed retroperitoneal hemorrhage.  Despite these problems she recovered and has done well from her heart.  She does have residual disease in the LAD but she has had a followup  scan since then which showed no evidence of ischemia.  She did have one  hospitalization earlier this year in [facility] with chest pain  and had a low risk Myoview scan at that time.  We did not do any further  evaluation.   She says she has been doing fairly well, has had no chest pain,  shortness of breath or palpitations.   PAST MEDICAL HISTORY:  Significant for:  1. Autoimmune liver disease for which she is followed at [facility] and for      which she takes prednisone and Rapamune.  2. She also has insulin-dependent diabetes.  3. She has cervical spine disease and pain for which she takes      methadone.  4. She has chronic cellulitis of the lower extremities.   CURRENT MEDICATIONS:  Bisoprolol which we instituted because of hair  loss instead of metoprolol.  She is on insulin, she is on Rapamune,  Zetia, WelChol, Metanx, methadone, Lasix, aspirin and prednisone.   EXAMINATION:  The blood pressure is 145/70 and the pulse 73 and regular.  There was no venous distention.  Carotid pulses were full without  bruits.  CHEST:  Clear.  Heart rhythm was regular.  I could hear no murmurs or   gallops.  ABDOMEN:  Soft, normal bowel sounds.  There is no hepatosplenomegaly.  She had trace to 1+ edema in the lower extremities, although the  extremities were somewhat tight; there was erythema on the anterior  aspect.  I had difficulty feeling the pedal pulses.   Electrocardiogram showed sinus rhythm with left axis deviation and  nonspecific ST-T changes.   IMPRESSION:  1. Coronary artery disease status post non-ST elevation myocardial      infarction in June, 2007, with subsequent placement of overlapping      drug-eluting stents in the right coronary artery with residual      disease in left anterior descending artery, complicated by wire      perforation and retroperitoneal hemorrhage, now stable, doing well.  2. Autoimmune liver disease.  3. Cervical spine disease.  4. Hyperlipidemia with intolerance to statins.  5. Chronic cellulitis to the lower extremities.   RECOMMENDATIONS:  [name] appears to be doing fairly well from the  standpoint of her heart.  We will not make any changes in her  medications.  I will plan to see her back in followup in 6 months.     Bruce Elvera Lennox Juanda Chance, MD, [facility]  Electronically Signed    BRB/MedQ  DD: 12/24/2006  DT: 12/24/2006  Job #: [id number]   cc:   Sharyne Peach

## 2010-12-22 NOTE — Procedures (Signed)
[facility]  Patient:    Cynthia Hicks, Cynthia Hicks                       MRN: [id number] Proc. Date: 01/13/01 Adm. Date:  [id number] Attending:  Thyra Breed CC:         Dr. Murtis Sink, M.D.  Dr. Greig Right, [facility]   Procedure Report  PROCEDURE:  Piriformis trigger point injection on the left side.  DIAGNOSES:  Piriformis syndrome, sciatica, and lumbar spondylosis.  INTERVAL HISTORY:  The patient has had a rash break out on her lower extremities.  She thinks it may be a poison ivy type syndrome, but she is not really certain.  It is more on her right lower extremity than on her left. She continues to have pain but overall states that it is down to a level of about 3/10.  She is interested in a piriformis injection into the left side.  PHYSICAL EXAMINATION:  Blood pressure 158/72.  Heart rate is 111.  Respiratory rate 16.  O2 saturations 98%.  Pain level is 3/10.  Deep tendon reflexes are symmetric in the lower extremities.  She is tender over the left piriformis muscle.  DESCRIPTION OF PROCEDURE:  After informed consent was obtained, the patient was placed in the right lateral decubitus position with the right leg extended and her left flexed at the hip and knee.  I identified the greater trochanter and drew a line from the greater trochanter to the posterior superior iliac spine and a second one from the greater trochanter to the sacral hiatus.  At the mid point of the first line, a perpendicular line was drawn down to intersect the second line.  Pressure over this area elicited her pain.  The area was marked and prepped with Betadine x 3.  A 27 gauge needle was used to raise a skin wheal with 1% lidocaine.  A 25 gauge spinal needle was introduced down to approximate the piriformis muscle with negative aspiration for blood. I injected 3 cc of a local anesthetic which consisted of 1% lidocaine mixed with 0.5% levobupivacaine in a 1:1 ratio.   There was 20 mg in 6 mL, so she got a total of 10 mg of Medrol.  The needle was removed intact.  POSTPROCEDURE CONDITION:  The patient noted that her hip pain markedly improved after the injection.  DISPOSITION: 1. Continue on current diet. 2. Limitations of activities per instruction sheet, as outlined by my    assistant today. 3. Continue on current medications.  Prescription written for methadone 5 mg 1    p.o. q.8h. #90 with no refill. 4. Follow up with me in four weeks, as previously scheduled.  She was advised    that she should let us know how she is doing in a couple of weeks so we can    determine if she needs another injection when she comes in to see [place]. DD:  01/13/01 TD:  01/13/01 Job: [id number]

## 2010-12-22 NOTE — H&P (Signed)
[facility]  Patient:    Cynthia Hicks, Cynthia Hicks                       MRN: [id number] Adm. Date:  [id number] Attending:  Thyra Breed CC:         J.S. Steva Ready, M.D., [place], [place]  Anthony Sar, M.D., [facility], [facility]   History and Physical  FOLLOWUP EVALUATION:  The patient comes in for followup evaluation of her sciatica.  Since her last evaluation, she has had a lot of her medications adjusted.  She has had an increase in the Rapamune and her prednisone has remained stable at the current dose.  When they tried to bring it down to 15 mg, apparently some of her liver enzymes went out of whack.  She does feel weaker overall and apparently one of the liver enzymes is up.  I asked her if it could possibly be muscular and she does not know.  She sees Dr. Greig Right at [facility] and Dr. Steva Ready in [place].  She noted that her stamina is not what it once was; she also notes that her pain is worse in her buttock than previously but attributes this to less exercise.  CURRENT MEDICATIONS 1. Prednisone 20 mg per day. 2. Rapamune 3 mg a day. 3. Provera. 4. Premarin. 5. Methadone 5 mg b.i.d. 6. Neurontin 100 mg per day. 7. Insulin.  EXAMINATION  VITAL SIGNS:  Blood pressure 132/67, heart rate 75, respiratory rate is 14, O2 saturation is 97%, pain level is 3/10 and temperature is 97.6.  EXTREMITIES:  Straight leg raise signs are negative.  Deep tendon reflexes were symmetric in the lower extremities.  IMPRESSION 1. Sciatica -- unchanged. 2. Autoimmune hepatitis, per Dr. Greig Right. 3. Diabetes mellitus, per Dr. Steva Ready. 4. Decreased stamina with abnormal liver enzymes, etiology unclear, may be    steroid-induced myopathy versus underlying myopathy from her other    medications versus just exacerbations of her diabetes.  DISPOSITION 1. Continue on the methadone 5 mg 1 p.o. b.i.d., #60 with no refill. 2. Continue other medications per Dr.  Steva Ready. 3. She asked about a handicap sticker and I advised her that I would be happy    to fill this out, or she could have Dr. Steva Ready fill this out for her. 4. I plan to see her back in followup in eight weeks. DD:  07/01/00 TD:  07/01/00 Job: [id number]

## 2010-12-22 NOTE — H&P (Signed)
[facility]  Patient:    Cynthia Hicks, Cynthia Hicks                       MRN: [id number] Adm. Date:  [id number] Attending:  Thyra Breed CC:         Dr. Steva Ready, [place] [facility]  Dr. Greig Right, [facility]   History and Physical  FOLLOW-UP EVALUATION:  Isha comes in for follow-up evaluation of her sciatica and foot pain.  Since her previous evaluation, her sciatica has improved somewhat but she continues to have the foot discomfort over the outer aspect of her foot.  She saw Dr. Greig Right at [facility], and she has been taken off of Neoral and placed on Rapamune, and she plans to have this checked this week to find out whether her blood counts are tolerating this.  She does state that her pain level is 2 out of 10.  She is down to one tablet of Neurontin per day and continues on the methadone twice a day.  She is having problems with sweats periodically, but she has had some difficulties with hypoglycemia also.  MEDICATIONS: Ortho-Est, Provera, prednisone 20 mg, methadone 5 mg twice a day, Neurontin 100 mg a day, Rapamune, and insulin.  PHYSICAL EXAMINATION:  VITAL SIGNS:  Blood pressure 157/70, heart rate 62, respiratory rate 16, O2 saturation 97%, pain level is 2 out of 10, and temperature is 97.2.  GENERAL:  The patient has cushingoid appearance to her today.  NEUROLOGIC:  Her neurologic exam shows negative straight leg raise signs with intact pinprick and vibratory sense.  IMPRESSION: 1. Sciatica, improved on current dose of methadone. 2. Foot pain, which I suspect is a sensory neuropathy. 3. Other medical problems per Dr. Steva Ready in [place] and Dr. Greig Right at [facility].  DISPOSITION: 1. Continue on the methadone at 5 mg one p.o. b.i.d. 2. Stop the Neurontin. 3. Follow up with me in eight weeks. DD:  05/06/00 TD:  05/06/00 Job: [id number]

## 2010-12-22 NOTE — Discharge Summary (Signed)
Cynthia Hicks, Cynthia Hicks                ACCOUNT NO.:  [id number]   MEDICAL RECORD NO.:  [id number]          PATIENT TYPE:  INP   LOCATION:  3733                         FACILITY:  [facility]   PHYSICIAN:  Olga Millers, M.D. LHCDATE OF BIRTH:  Apr 22, 1933   DATE OF ADMISSION:  02/01/2006  DATE OF DISCHARGE:  02/09/2006                                 DISCHARGE SUMMARY   PROCEDURES:  1.  Cardiac catheterization.  2.  Coronary arteriogram.  3.  Left ventriculogram  4.  PTCA and Taxus stent to the RCA.  5.  2-D echocardiogram  6.  Blood transfusions.   PRIMARY DIAGNOSIS:  Non-ST segment elevation myocardial infarction.   SECONDARY DIAGNOSES:  1.  Wire perforation of the distal PLV treated with coil occlusion.  2.  Pericardial effusion.  3.  Blood loss anemia.  4.  Retroperitoneal bleed.  5.  Insulin-dependent diabetes mellitus.  6.  Acute renal insufficiency.  7.  Some degenerative joint disease.  8.  Thrombocytopenia with a platelet count of 87,000, HIT panel negative.  9.  Paroxysmal atrial fibrillation.  10. Hyperlipidemia.  11. Hypothyroidism.  12. Autoimmune hepatitis.   TIME OF DISCHARGE:  42 minutes.   HOSPITAL COURSE:  Cynthia Hicks is a 77 year old female with no previous history  of coronary artery disease.  She developed chest pain over the weekend and  was seen by her [facility] physicians.  She went to [facility] and was  transferred to [facility] for further evaluation and treatment.   Her troponins were elevated, and she was placed on Lovenox and Integrilin.  She was taken to the cath lab on February 04, 2006 for further evaluation.   The culprit lesion was felt to be a 95% RCA, which was treated with PTCA and  Taxus stent.  There was 80-90% LAD for which medical therapy was  recommended, and her EF was 60%.  The procedure was complicated by distal  wire perforation of the PLV.  It caused a small pericardial effusion and was  treated with coil occlusion.   An  echocardiogram was performed, and it showed an EF of 40-50%, but only a  small pericardial effusion.  A CVTS consult was called, and she was seen by  Dr. Laneta Simmers.  Because she was hemodynamically stable, no surgical  intervention was indicated.   On February 05, 2006, she became hypotensive with a systolic blood pressure in  the 70s.  She had had no blood pressure-lowering medications, only pain  medications.  A stat hemoglobin and hematocrit were performed which showed a  hemoglobin of 7.8 with hematocrit of 22.6.  Transfusions were ordered, and a  CT of the abdomen was ordered, as well.   CT of the abdomen and pelvis showed a moderate-sized hematoma in the left  iliac fossa, likely representing a retroperitoneal bleed from heart cath.  Anticoagulation was put on hold.   Her blood counts stabled over the next 48 hours with a hemoglobin of 10.1  and hematocrit of 29.9 at discharge.  After the initial transfusion, she  stabilized and did not require any further units  of blood.  Her platelets  decreased to a low of 83,000, and it was felt that this was partly  delusional.  A HIT panel was performed and was negative.  Her platelet count  slowly improved and at discharge was 119,000.   Cynthia Hicks developed atrial fibrillation with rapid ventricular response.  She was started on a beta blocker and amiodarone.  When she stabilized, the  amiodarone was transitioned from IV to p.o.  She converted spontaneously to  sinus rhythm.  She was to be on aspirin and Plavix, and because she was in  atrial fibrillation for less than 24 hours, did not require Coumadin.  This  was to be avoided anyway with her retroperitoneal bleed.  An iron profile  was performed and showed a total iron of 24 with TIB of 263 and 9%  saturation.  She will be started on iron supplementation and follow up with  her family physician.  A lipid profile was performed that showed a total  cholesterol of 321, triglycerides 548, HDL 53  and LDL not calculated.  She  had been on Welchol and Zetia prior to admission, and this has continued.  Because of her medical problems this admission, no changes in her lipid-  lowering medications were made at this time.  They could be done as an  outpatient.  She had a TSH checked, which was slightly low at 0.310.  Free  T4 was within normal limits at 1.17.  No further workup is indicated at this  time.   Because of her arthritis, Cynthia Hicks has significant problems with joint  problems.  She was seen by cardiac rehab and is to increase ambulation at  home.  If she wishes do outpatient cardiac rehabilitation, she can be  referred as an outpatient when she recovers.   On February 09, 2006, Cynthia Hicks was seen by Dr. Jens Som.  He felt that she  should have a TSH rechecked in 4 weeks by her family physician.  He felt  that she could decrease her amiodarone to 400 mg a day for 2 weeks and then  200 mg a day.  This could most likely be discontinued after 3 months.  She  is to continue on the Welchol and the Zetia, given her history of autoimmune  hepatitis.  She was transitioned from metoprolol to Toprol XL.  She is to  continue aspirin and Plavix.  An iron supplement was added to her  medication, as well.  She had some burning with urination after a Foley  catheter was discontinued.  A urinalysis is pending at the time of  dictation.  Once the results are back and we can determine whether or not  she needs antibiotics, she will be discharged with outpatient follow-up  arranged.   DISCHARGE INSTRUCTIONS:  1.  Her activity level is to be increased very slowly.  She is not to do any      weight lifting for 2 weeks, and no driving for 2 days.  2.  She is to call for any problems with her cath site.  3.  She is to stick to a low-fat diabetic diet.   FOLLOWUP:  1.  She is to follow up with Dr. Gala Romney or Dr. Juanda Chance, and a message has      been left with the office to call her. 2.  She is to  follow up with her family physician, as well.   DISCHARGE MEDICATIONS:  1.  Toprol XL 50  mg a day.  2.  Amiodarone 200 mg 2 tablets q. day for 2 weeks, then 1 tablet daily.  3.  70/30 insulin 15 units q.a.m.  4.  Prednisone 10 mg daily.  5.  Rapamune 2 mg a day.  6.  Zetia 10 mg a day.  7.  Welchol 625 mg 2 tablets b.i.d.  8.  Methadone 10 mg a day.  9.  Plavix 75 mg a day.  10. Nitroglycerin sublingual p.r.n.  11. Coated aspirin 325 mg a day.  12. Protonix 40 mg a day.  13. Nu-Iron 150 mg b.i.d.      Theodore Demark, P.A. [facility]    ______________________________  Olga Millers, M.D. [facility]    RB/MEDQ  D:  02/09/2006  T:  02/09/2006  Job:  [id number]   cc:   Arvilla Meres, M.D. [facility]  [employer]  [address]  [place]  [place] [address]   Sharyne Peach  Fax: [phone]

## 2010-12-22 NOTE — Assessment & Plan Note (Signed)
[facility]                              CARDIOLOGY OFFICE NOTE   NAME:Cynthia Hicks, Cynthia Hicks                       MRN:          [id number]  DATE:03/05/2006                            DOB:          [birth year]    This is a patient of Bruce R. Juanda Chance, MD.  Patient seen in the [facility]  clinic on March 05, 2006.  This is a pleasant 77 year old married white  female patient who presented to the hospital with non-ST segment elevation  MI.  She underwent successful stenting of the lesion in the mid-RCA with two  tandem overlapping Taxus stents with improvement in narrowing from 95% to 0.  She did develop a wire perforation in the PLA of the RCA artery due to  hydrophilic wire in the presence of Integrilin, which was treated with coil  embolization of the PLA branch, causing occlusion of this branch.  She also  has a residual 80-90% LAD after the septal perforator to the diagonal branch  in the mid-LAD, normal circumflex, and normal LV function, ejection fraction  60%.  The patient then developed small pericardial effusion.  An echo showed  EF of 40-50% but only a small effusion.  CVTS was consulted but she was  hemodynamically stable and no surgical intervention was indicated.  On July  3 she became hypotensive and hemoglobin dropped to 7.8.  CT of her abdomen  showed a moderate-sized hematoma of the left iliac fossa likely representing  a retroperitoneal bleed from her cardiac catheterization.  Anticoagulation  was put on hold and she was transfused one unit of blood.  She then  developed atrial fibrillation with a rapid ventricular response.  She was  treated with beta blocker and amiodarone and converted spontaneously to  sinus rhythm.  When she was discharged, her pharmacy would not give her  amiodarone because of methadone interaction and she has not had any  recurrence of the atrial fibrillation.  Because she was in it for less than  24 hours they did not  feel a need to anticoagulate her, especially with her  recent retroperitoneal bleed.   Since the patient went home, she is doing quite well.  She is still a bit  weak from her anemia but denies any chest pain, shortness of breath or  palpitations.  Her biggest complaint is constipation from the methadone and  iron combination.   CURRENT MEDICATIONS:  1.  Toprol XL 50 mg daily.  2.  Insulin 70/30 10 units in the morning.  3.  Prednisone 10 mg daily.  4.  Rapamune 2 mg daily.  5.  Zetia 10 mg daily.  6.  Welchol 625 mg two b.i.d.  7.  Methadone 10 mg daily.  8.  Plavix 75 mg daily.  9.  Aspirin 325 mg daily.  10. Protonix 40 mg daily.  11. Nu-Iron 150 mg b.i.d.   PHYSICAL EXAMINATION:  GENERAL:  This is a pleasant 77 year old white female  in no acute distress.  VITAL SIGNS:  Blood pressure 128/66, pulse 88, weight 155.08.  NECK:  Without JVD, __________, bruit,  or thyroid enlargement.  LUNGS:  Clear to anterior and posterolateral.  CARDIAC:  Regular rate and rhythm at 88 beats per minute.  Normal S1 and S2.  No significant murmur heard.  ABDOMEN:  Soft without organomegaly, masses, lesions or abnormal tenderness.  There is a small knot in her left abdominal area.  Groins are stable without  hematoma or hemorrhage.  EXTREMITIES:  She has +1-2 chronic lower extremity edema.  Positive distal  pulses.   EKG:  Normal sinus rhythm except poor R wave progression, no acute change.   IMPRESSION:  1.  Coronary artery disease, status post non-ST segment elevation myocardial      infarction, treated with two tandem overlapping Taxus stents to the mid-      right coronary artery with residual 80-90% mid-left anterior descending      artery after the septal perforator and diagonal branch, normal left      ventricular function, complicated by wire perforation in the      posterolateral artery branch of the right coronary artery due to      hydrophilic wire in the presence of Integrilin,  treated with coil      embolization of the posterolateral artery branch, causing occlusion of      this branch.  2.  Pericardial effusion secondary to wire perforation.  3.  Retroperitoneal bleed requiring transfusion.  4.  Paroxysmal atrial fibrillation converted to normal sinus rhythm, cannot      take amiodarone.  5.  Autoimmune hepatitis.  6.  Hyperlipidemia.  7.  Hypothyroidism.  8.  Thrombocytopenia with a platelet count of 87,000, HIT panel negative.  9.  Acute renal insufficiency, resolving.   PLAN:  The patient is stable off the amiodarone, maintaining a normal sinus  rhythm.  She just had some blood work on July 23, and her hemoglobin was  10.6, hematocrit 31, platelets were normalized to 204 and creatinine  normalized to 1.2.  We will have her have a repeat 2-D echo and see Dr.  Juanda Chance back in 2 weeks, and she should have repeat blood work in 2 weeks as  well.                                  Jacolyn Reedy, PA-C    ML/MedQ  DD:  03/05/2006  DT:  03/05/2006  Job #:  [id number]

## 2010-12-22 NOTE — H&P (Signed)
[facility]  Patient:    Cynthia Hicks, Cynthia Hicks                       MRN: [id number] Adm. Date:  [id number] Attending:  Thyra Breed CC:         J.S. Steva Ready, M.D., [place], [place]  Anthony Sar, M.D., [facility]   History and Physical  FOLLOWUP EVALUATION:  The patient comes in for followup evaluation today. Since her last evaluation, she has gone off the Neurontin.  For the past couple of months, she has felt as though her pain is getting worse and she feels as though it has doubled in intensity.  She rates her pain at a level of 2-3/10, which is identical to her last visit, but she feels much more limited at times.  Most of her pain is in her left buttock, down to the left calf, and radiates out.  It is made worse by walking and standing and improved by lying or sitting.  She continues on the methadone at 5 mg twice a day.  She is also on Rapamune, prednisone, Premarin, Provera and insulin.  EXAMINATION  VITAL SIGNS:  Blood pressure 171/74, heart rate is 91, respiratory rate is 18, O2 saturation is 99%, pain level is 2/10 and temperature is 99 degrees.  NEUROLOGIC:  She exhibits negative straight leg raise signs.  Deep tendon reflexes were symmetric in the lower extremities.  EXTREMITIES:  Additional physical findings reveal that she does have pitting edema which is at least 0.5 cm in depth.  IMPRESSION 1. Sciatica with increased level of pain. 2. Autoimmune hepatitis per Dr. Anthony Sar at [facility]. 3. Diabetes mellitus per Dr. Ronnette Hila. Sandhu. 4. Decreased stamina with history of low potassium.  DISPOSITION 1. The patient was encouraged to follow up with Dr. Steva Ready about the    possibility of proteinuria from her diabetes and with the peripheral edema. 2. Increase methadone to 5 mg 1 p.o. q.8h., #90 with no refill. 3. Follow up with me in eight weeks. 4. She is to let us know if she is not getting a good response to her    increased dose of methadone.   She feels as though the sedation she suffered    from previously was related to her fluctuations in blood sugar levels. DD:  08/26/00 TD:  08/26/00 Job: [id number]

## 2010-12-22 NOTE — H&P (Signed)
[facility]  Patient:    Cynthia Hicks, Cynthia Hicks                       MRN: [id number] Adm. Date:  [id number] Attending:  Thyra Breed CC:         Roe Rutherford, M.D., [place], [place]  Anthony Sar, M.D., [facility]   History and Physical  FOLLOWUP EVALUATION:  The patient comes in for followup evaluation of her chronic sciatica.  Since her last evaluation, she notes that increasing the methadone has helped her pain to an extent; unfortunately, she still has significant discomfort if she overexerts herself with prolonged standing or walking, which is improved by sitting or lying.  She has a discomfort that radiates out into the leg along the posterior aspect of her buttock to her calf.  She continues on prednisone at 12.5 mg a day as well as Rapamune, Premarin, Provera and insulin.  She has been started on Zestril and Lipitor since her last visit.  PHYSICAL EXAMINATION:  VITAL SIGNS:  Blood pressure 127/66, heart rate is 95, respiratory rate is 20, O2 saturation is 97% and pain level is 3/10.  NEUROLOGIC:  Straight leg raise signs are negative.  Deep tendon reflexes are symmetric in the lower extremities.  Motor was 5/5.  EXTREMITIES:  She has pitting edema of the lower extremities with shiny skin bilaterally.  IMPRESSION: 1. Sciatica -- mildly improved with increased dose of methadone. 2. Autoimmune hepatitis per Dr. Anthony Sar at [facility]. 3. Diabetes mellitus per Dr. Steva Ready.  DISPOSITION: 1. Continue on current dose of methadone at 5 mg q.8h., #90 with no refill. 2. Follow up with me in eight weeks. 3. I encouraged her to follow up closely with Dr. Greig Right and    Dr. Ronnette Hila. Sandhu with regard to her edema.  She is apparently scheduled to    have some ultrasounds of her lower extremities to assess this in more    detail. DD:  10/21/00 TD:  10/22/00 Job: [id number] EP/[id number]

## 2010-12-22 NOTE — Consult Note (Signed)
[facility]  Patient:    Cynthia Hicks, Cynthia Hicks                       MRN: [id number] Proc. Date: 01/15/00 Adm. Date:  [id number] Attending:  Thyra Breed CC:         Dr. Bobette Mo, [place]             Alanson Aly. Roxan Hockey, M.D.                          Consultation Report  FOLLOW-UP EVALUATION  HISTORY OF PRESENT ILLNESS:  Cynthia Hicks comes in for follow-up evaluation of her sciatica into the left side. Since her previous evaluation, the patient has been back to see Dr. Steva Ready and was noted to have an elevated creatinine of 2. Her urine apparently showed that she had an infection and she is currently being treated for this. She plans to see Dr. Margie Billet in about 3-4 weeks with regard to elevations in her creatinine. There is a concern that this may be reflective of a Neoral toxicity.  She has had a bone scan performed which apparently was very positive.  Her biggest complaints are fatigue and sedation. She will go to sleep very easily. She complains of achiness in the calves of both lower extremities as well as in the hamstrings. She is limited in her exercise due to the discomfort in her calves and thighs.  She has seen Dr. Hal Neer with regard to her feet and he does not feel as though she is suffering from plantar fasciitis but he has prescribed orthotics.  CURRENT MEDICATIONS:  Premarin, Provera, Neurontin 100 mg in the evening and 200 in the morning. Methadone 5 mg b.i.d., Zestril, prednisone and Neoral.  PHYSICAL EXAMINATION:  VITAL SIGNS:  Blood pressure 154/91, heart rate 77, respiratory rate 12, O2 saturations 97% and pain level is 1/10 and temperature is 97.2.  EXTREMITIES:  The patient demonstrated no tenderness to palpation over the Achilles tendon as it inserts into the plantar aponeurosis nor any tenderness over the plantar aponeurosis of the foot. Compression of the MTPs did not elicit discomfort. Sensation was intact to vibratory sense  and pinprick as well as light touch. Dorsalis pedis pulse were 2+ and symmetric. Deep tendon reflexes were unchanged from previously.  IMPRESSION: 1. Sciatic predominantly on the left. 2. Suspect component of sensory neuropathy into the lower extremities    although this is minimal on exam today. 3. Other medical problems per Dr. Steva Ready in [place].  DISPOSITION: 1. Continue on methadone 5 mg 1 p.o. b.i.d. 2. Reduce Neurontin to 100 mg twice a day. 3. Followup with me in 8 weeks. 4. The patient plans to go ahead and see Dr. Margie Billet. The patient was    advised that I would like to go ahead and proceed with another series    of epidurals to see if we can get her pain under better control but I    wanted to do these after she has seen Dr. Margie Billet. DD:  01/15/00 TD:  01/15/00 Job: [id number]

## 2010-12-22 NOTE — Assessment & Plan Note (Signed)
[facility] HEALTHCARE                              CARDIOLOGY OFFICE NOTE   NAME:Cynthia Hicks, Cynthia Hicks                       MRN:          [id number]  DATE:03/11/2006                            DOB:          [birth year]    PRIMARY CARE PHYSICIAN:  Dr. Sharyne Peach.   CLINICAL HISTORY:  Cynthia Hicks is 77 years old and was admitted to the  hospital on July 2nd with chest pains and enzyme changes consistent with non-  ST infarction.  She was transferred from [facility] emergency room to [place] and  underwent angiography and was found to have tight lesions in the mid-right  coronary artery in the 80-90% stenosis in the LAD.  Her catheterization was  complicated by a wire perforation with a hydrophilic wire while she was on  Integrilin requiring embolization of the small posterolateral branch to seal  the leak.  This appeared to take care of the problem, but the next day she  developed hypotension and a dropping hemoglobin, was found to have a  retroperitoneal hemorrhage.  This appeared to take care of the problem, but  the next day she developed hypotension and a dropping hemoglobin, was found  to have a retroperitoneal hemorrhage.  This resolved with conservative  therapy, and she was finally able to be discharged.   She has done quite well since that time.  She has gradually re-gained her  strength and gotten back into her activities.  She had no symptoms of chest  pain, shortness of breath or palpitations.   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   CURRENT MEDICATIONS:  Toprol, insulin, prednisone, Rapamune, Zetia, WelChol,  methadone, Plavix, aspirin, Protonix, iron.   ALLERGIES:  SHE IS INTOLERANCE TO STATINS DUE TO LEG PAIN.   EXAMINATION:  GENERAL:  The blood pressure is 148/71, the pulse 75 and  regular.  Venous pulsation visible 1 cm above the neck.  The carotid pulses  were full without bruits.  CHEST:  Clear.  CARDIAC:  Regular.  I could hear no murmurs or gallops.  ABDOMEN:  Soft with normal bowel sounds.  There is no hepatosplenomegaly.  Peripheral pulses were full.  There was 2+ peripheral edema with some slight  stasis changes.   IMPRESSION:  1.  Coronary artery disease status post recent non-ST elevation myocardial      infarction, treated with two drug-eluting stents of the right coronary      artery with residual 80% narrowing in the LAD.  2.  Wire perforation of the posterolateral branches of the circumflex artery      complicating PCI procedure, treated with distal embolization of the      small posterolateral branch.  3.  Retro-peritoneal hemorrhage complicating the PCI procedure in the      presence of Integrilin.  4.  Auto-immune liver disease.  5.  Cervical spine disease.  6.  Hyperlipidemia with intolerance to Statins.   RECOMMENDATIONS:  I think Cynthia Hicks is doing much better.  She does appear  to have some evidence of volume overload, and we will start her on Lasix 40  mg a day and then 20 mg a day and get a BNP when she comes in the next  couple of weeks.  She had an echocardiogram today which does not mention any  pericardial effusion and shows good LV function.  I need specifically to get  an assessment of the mitral valve and the pericardial effusion from the  reader today.  We will arrange for her to come back in two to three weeks  for a exercise rest stress Myoview scan to evaluate her residual LAD  disease.  I will see her back in two months or sooner if her skin is  abnormal.                               Bruce R. Juanda Chance, MD, [facility]    BRB/MedQ  DD:  03/11/2006  DT:  03/11/2006  Job #:  [id number]   cc:   Sharyne Peach

## 2010-12-22 NOTE — Assessment & Plan Note (Signed)
[facility] HEALTHCARE                            CARDIOLOGY OFFICE NOTE   NAME:Hackworth, LEISEL PINETTE                       MRN:          [id number]  DATE:08/19/2006                            DOB:          1933/06/08    PRIMARY CARE PHYSICIAN:  Jagjit Sandhu.   CLINICAL HISTORY:  Ms. Fanton is 77 years old and returned for a followup  visit after her recent hospitalization at [facility] for chest  pain.  She had some right sided chest pain, hospitalized over night and  ruled out for a myocardial infarction.  She had a Myoview scan that was  interpreted as showing a scar in the lateral wall of the left ventricle  with some mild periinfarction ischemia.  There also was a fixed defect  in the inferior wall.  Her ejection fraction was 49%.   Ms. Bourget has had a previous percutaneous coronary intervention with a  drug-eluting stent in the right coronary artery.  Her procedure was  complicated by wire perforation of a posterolateral branch treated with  coil embolization, and the procedure was subsequently complicated by a  retroperitoneal hemorrhage.  The patient had been treated with  Integrilin at the time of her intervention.  Despite these complications  she recovered and has done fairly well since that time.  She had  residual 80% narrowing in the LAD and we had done a Myoview scan which  showed no ischemia in this distribution.  The ischemia in the  distribution of the scan from [place] does not appear to be an LAD  distribution.   Ms. Simkin also indicates she has had trouble with her fair falling out  and wonders if this could be related to Toprol.  She has also had a  great difficulty swallowing the WelChol tablets.   PAST MEDICAL HISTORY:  Significant for:  1. 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  2. She also has auto-immune liver disease and is followed at [facility] and      takes prednisone and Rapamune for this.  3. History of insulin  dependent diabetes.   CURRENT MEDICATIONS:  Toprol, insulin, Rapamune, Zetia, WelChol,  methadone, Lasix, aspirin and prednisone.   EXAMINATION:  The blood pressure is 143/66 and the pulse 75 and regular.  (The blood pressure normally runs in the range of 135 systolic).  There  was no venous distention.  The carotid pulses were full and I could hear  no bruits.  CHEST:  Clear.  The cardiac rhythm was regular.  I could hear no murmurs  or gallops.  ABDOMEN:  Soft with normal bowel sounds.  There is no  hepatosplenomegaly.  There was trace peripheral edema.  The pedal pulses  were equal.   An electrocardiogram today showed left axis deviation and nonspecific ST-  T changes.  There was some artifact in the baseline and I could not tell  for sure  if the rhythm was sinus rhythm or not.   IMPRESSION:  1. Coronary artery disease status post non ST elevation myocardial      infarction in June, 2007,  treated with a drug eluting stent to the      right coronary artery with residual disease in the left anterior      descending.  2. Recent episode of chest pain with borderline abnormal Myoview scan.  3. Auto-immune liver disease.  4. Cervical spine disease.  5. Hyperlipidemia intolerant to Statins.  6. Hair loss possibly related to Toprol.   RECOMMENDATIONS:  Ms. Pickeral' recent symptoms sound somewhat atypical for  ischemia.  Her scan is low risk but is still abnormal.  I told her that  I did not think we needed to do any further evaluation at the present  time but that if she had increased symptoms of chest pain that she  should let us or Dr. Steva Ready know.  I told her that symptoms of ischemia  were sometimes different in women than in men.  She is having a great  deal of difficulty swallowing the WelChol and I asked her to ask Dr.  Steva Ready if he thinks cholestyramine might be considered, although this  sometimes is not as well tolerated.  Because of her hair loss we stopped  the Toprol and  we have substituted bisoprolol 5 mg daily.  This is a  more specific beta-blocker and hope it will not have the same side  effects as Toprol.  I will plan to see her back in 4 months, but I will  see her back sooner if she develops recurrent symptoms of chest pain.  I  recognize that the rhythm on the ECG was possibly abnormal, though I  cannot tell for certain.  However, I do not think this will change our  therapy since her rate is well controlled and I think she is a high risk  Coumadin candidate given her prednisone and previous history of  bleeding.     Bruce Elvera Lennox Juanda Chance, MD, [facility]  Electronically Signed    BRB/MedQ  DD: 08/19/2006  DT: 08/20/2006  Job #: [id number]

## 2010-12-22 NOTE — Assessment & Plan Note (Signed)
[facility] HEALTHCARE                              CARDIOLOGY OFFICE NOTE   NAME:Cynthia Hicks, Cynthia Hicks                       MRN:          [id number]  DATE:05/08/2006                            DOB:          Sep 16, 1932    PRIMARY CARE PHYSICIAN:  Sharyne Peach, M.D.   CLINICAL HISTORY:  Ms. Bove is 77 years old and on June 28 was hospitalized  with a non-ST elevation infarction.  She was transferred from [facility]  Emergency Room to [place] and was found to have severe disease in the right  coronary artery for which she underwent placement of a 2.75 by 32 Taxus  stent.  The procedure was complicated initially by no flow which responded  to Verapamil and then by a wire perforation due to a hydrophilic on  Integrilin.  We treated this with coil embolization in the posterolateral  branch.  This appeared to take care of the problem and the occlusion of the  posterolateral branch and did not seem to have any major consequences since  it was a very small vessel.  However, she subsequently developed a  retroperitoneal hematoma which was treated conservatively.  I saw her back  on August 6 and she was doing much better.  She had residual 80-90% stenosis  in the LAD and we did a Myoview scan and surprisingly to me, this showed no  evidence of ischemia with good LV function.   She says she has done well since that time, has had no chest pain, shortness  of breath, or palpitations.  However, she has had severe bruising on the  combination of Plavix and aspirin.  She stopped the aspirin a few days ago  because of this.  She also developed a pain in her left medial thigh just  above the knee with some ecchymoses.   PAST MEDICAL HISTORY:  Significant for cervical spine disease for which she  takes Methadone.  She also has auto-immune liver disease which is followed  at [facility] and for which she takes prednisone and Rapamine.   CURRENT MEDICATIONS:  Toprol, insulin, prednisone,  Rapamine, Zetia, Welchol,  Plavix, aspirin, Methadone, and Lasix.   PHYSICAL EXAMINATION:  VITAL SIGNS:  Blood pressure 161/77, pulse 67 and regular.  NECK:  There was no venous distention.  The carotid pulses were full without  bruits.  CHEST:  Clear.  HEART:  Rhythm is regular.  No murmurs or gallops.  ABDOMEN:  Soft, normal bowel sounds.  EXTREMITIES:  There is no peripheral edema.  There were marked ecchymoses  over both upper and lower extremities.  There was an indurated area in the  medial left thigh with some early ecchymoses which appeared to represent a  hematoma.   IMPRESSION:  1. Coronary artery disease status post non-ST elevation myocardial      infarction June 2007 treated with a drug eluting stent to the right      coronary artery with residual 80% narrowing in the left anterior      descending with a non-ischemic Myoview scan, now stable.  2. Wire perforation in the posterolateral branch  of the circumflex artery      complicating the PCI procedure treated with distal embolization.  3. Retroperitoneal hemorrhage complicating the PCI procedure.  4. Auto-immune liver disease.  5. Cervical spine disease.  6. Hyperlipidemia intolerant to statins.   RECOMMENDATIONS:  I think Ms. Yeater is stable from a cardiac standpoint.  She indicated that she had to increase her steroids temporarily because of  increased liver function tests and this was done by Dr. Katrinka Blazing at [facility].  She  has a tremendous amount of ecchymoses which is quite disabling to her and  she also appears to have a hematoma in her left thigh.  She has been on  three months of Plavix and I think, at this point, we will be forced to  switch her from Plavix to aspirin.  She has actually been off the aspirin  now, as well.  I told her to start 325 mg of aspirin daily and after one  week, cut back to 81 mg.  I told her she could go ahead and stop the Plavix  tomorrow.  I will plan to see her back in follow up in three  months.            ______________________________  Everardo Beals. Juanda Chance, MD, [facility]     BRB/MedQ  DD:  05/08/2006  DT:  05/09/2006  Job #:  [id number]

## 2010-12-22 NOTE — H&P (Signed)
[facility]  Patient:    Cynthia Hicks, Cynthia Hicks                       MRN: [id number] Adm. Date:  [id number] Attending:  Thyra Breed                         History and Physical  Shahad comes in for followup evaluation today.  She continues to have pain whenever she tries to exercise, predominantly in the sciatic distribution of the left lower extremity.  She describes it as a level 3/10.  She continues to have recurrent swelling of her lower extremities and has had blood studies which show normal BUN of 25, creatinine 1.7, glucose 58, cholesterol 347, triglycerides 566.  Dr. ______ feels like it is probably the Rapamune is causing some of the edema.  Patient notes her pain is made worse by walking and standing, improved by sitting or laying.  CURRENT MEDICATIONS: 1. Premarin. 2. Provera. 3. Prednisone 10 mg q.d. 4. Rapamune. 5. Prinivil. 6. Lipitor. 7. Insulin. 8. Methadone.  PHYSICAL EXAMINATION  VITAL SIGNS:  Blood pressure 110/43, heart rate 83, respiratory rate 16, O2 saturation 96%, pain level 4/10.  EXTREMITIES:  Patient demonstrates positive straight leg raise sign on the left side.  Deep tendon reflexes were symmetric.  IMPRESSION: 1. Sciatica with possible underlying piriformis syndrome with previous history    of lumbar spondylosis with neuroforaminal stenosis. 2. Other medical problems per Dr. ______.  DISPOSITION: 1. Continue on current dose of methadone 5 mg one p.o. q.8h. #90 with no    refill. 2. Follow-up for a piriformis injection.  We discussed the possibility of    trying a piriformis injection again and the patient is quite open to this    at this time.  Previously we had tried it when she was in severe pain and    she has somewhat settled down over the past couple of years.  She is quite    open to it and we will go ahead and set her up for this. 3. She is to continue on her other medications. DD:  12/16/00 TD:   12/16/00 Job: [id number]

## 2010-12-22 NOTE — H&P (Signed)
[facility]  Patient:    Cynthia Hicks, Cynthia Hicks                       MRN: [id number] Adm. Date:  [id number] Attending:  Thyra Breed CC:         Dr. Steva Ready, [place]             Dr. Denzil Hughes, [facility]                         History and Physical  FOLLOW-UP EVALUATION:  [name] comes in for follow-up evaluation of her sciatica into the left lower extremity with associated bilateral foot pain. Her foot pain seems to be increasing rather than decreasing.  It affects the dorsum and soles of her foot and is not localized to the joints.  It is more pronounced when she weightbears.  Her sciatica, as she states it, seems to be very tolerable and well-controlled.  She has no desire to change the medications with regard to this.  She has been back to see Dr. Denzil Hughes, who has recommended that she see Dr. _____.  She was taken off of her Neoral in the event that it is causing some of her problems.  Her creatinine is down to 1.8.  MEDICATIONS:  Current medications include prednisone 10 mg per day, Neoral, Premarin, Provera, methadone 5 mg b.i.d., Neurontin 100 mg b.i.d., and Prinivil, as well as her insulin.  PHYSICAL EXAMINATION:  VITAL SIGNS:  Blood pressure 139/66, heart rate 67, respiratory rate 20, O2 saturation is 99%, pain level is 3 out of 10.  EXTREMITIES/NEUROLOGIC:  She demonstrates intact pinprick and vibratory sense over the feet with dorsalis pedis pulses 2+ and symmetric.  She has bony enlargement of the first MTPs but no tenderness of the plantar aponeurosis. Straight leg raise signs are negative.  IMPRESSION: 1. Sciatica, stable. 2. Foot pain, which I suspect is a sensory neuropathy, although she has an    intact pinprick and vibratory sense. 3. Other medical problems per Dr. Steva Ready.  DISPOSITION: 1. Continue on methadone at current dose. 2. Reduce Neurontin to 100 mg at night. 3. Trial of Capsaicin to her feet. 4.  Follow up with me in eight weeks. DD:  03/11/00 TD:  03/11/00 Job: [id number]

## 2010-12-22 NOTE — Cardiovascular Report (Signed)
NAMEALANTIS, Cynthia Hicks NO.:  [id number]   MEDICAL RECORD NO.:  [id number]          PATIENT TYPE:  INP   LOCATION:  2910                         FACILITY:  [facility]   PHYSICIAN:  Charlies Constable, M.D. [facility] DATE OF BIRTH:  06-13-1933   DATE OF PROCEDURE:  02/04/2006  DATE OF DISCHARGE:                              CARDIAC CATHETERIZATION   PROCEDURE:  Cardiac catheterization and percutaneous intervention   CLINICAL HISTORY:  Ms. Chretien is a 77 years old and has no known prior heart  disease.  She was admitted to the hospital.  She developed chest pain this  weekend and was seen in the physician office in [place] and then  transferred to [place] for further evaluation.  Her enzymes were positive  consistent with a non-ST-elevation infarction and she was treated with  Lovenox and Integrilin.  She is brought to the lab today for angiography and  possible intervention.   PROCEDURE:  The procedure was initially attempted via right femoral artery,  but we had difficulty with access and had to access the left femoral artery.  After completion of the diagnostic study we decided to proceed with  intervention on the right coronary artery.  The patient was given Angiomax  bolus and infusion, but the IV in the left arm infiltrated and she did not  get the drug and had a great deal of pain in the left arm with the infusion.  We had to start a femoral artery line for access since it was very difficult  to get a venous access.  We then gave her a second bolus of Angiomax plus  infusion.  She was also given 300 mg of Plavix and Pepcid.  We then  proceeded with intervention on the right coronary artery.   We used a 6-French JR-4 guiding catheter with side holes.  We decide to use  a PT2 light support wire because the lesion there is long, tight, and  complex and I thought it would be very difficult to cross.  We did this  despite the use of Integrilin.  We crossed the lesion without too  much  difficulty.  We went in with a 2.5 x 20-mm balloon and after performing two  inflations up to 8 atmospheres for 30 seconds we obtained no flow.  I could  not tell whether this was a spiral dissection or no flow due to distal  vascular problems.  We elected to stent the vessel and we placed a 2.75 x 32-  mm Taxus and deployed this with one inflation of 12 atmospheres for 30  seconds.  We still had no flow down the vessel.  We then passed an Export  and subsequently an  infusion catheter distal to the lesion and gave distal  verapamil which reestablished flow.  We then postdilated the stent with a  3.0 x 20-mm Quantum Maverick performing two inflations up to 15 atmospheres  for 30 seconds.  We thought we were done, then we recognized contrast in the  pericardium, and on the RAO injection identified a small wire perforation on  the posterolateral  branch communicating with the pericardium.  We passed a  Prowater wire down the posterolateral branch and a 1.5 x 12-mm balloon.  We  positioned this just past the posterior descending branch and posterolateral  branch and inflated the balloon to interrupt flow to this area.  We also  passed a Swan-Ganz catheter via the left femoral vein to the pulmonary  artery and documented a right atrial pressure of proximally 10.  We stopped  the Integrilin and the Angiomax immediately when we recognized the wire  perforation.  We obtained a bedside echo which showed a moderate pericardial  effusion but no signs of tamponade.  Her blood pressure remained stable  throughout.  We then let the balloon down, but this did not result in  occlusion of the wire perforation and fistula.  Dr. Samule Ohm came in to assist  me and we decided to place a coil to try and occlude the vessel.  We passed  a Renegade micro catheter over the wire, passed the posterior descending  branch into the posterolateral branch, and then up placed a 2.0 x 10-mm  fiber coil through the micro  catheter into the posterolateral branch.  We  had some difficulty advancing the coil into position, but finally were able  to accomplish this.  However, this did not occlude the vessel.  We did  prolong the balloon inflation's proximal coil with the hope that this would  help occlude the vessel, but this still did not result in occlusion.  We  then passed a small amount of fat down the catheter, first with a syringe,  and then with a wire, and we think we positioned this distal to the balloon.  We then did another prolonged balloon inflation of 15 minutes and this  resulted in occlusion of the posterolateral branch and occlusion of the  fistula.  Final diagnostic studies were then performed through the guiding  catheter   The procedure was extremely long and complicated, and lasted about 4 hours.  The patient was stable of when she left the laboratory.   RESULTS:  The left main coronary artery is free of significant disease.   The left anterior descending artery gave rise to a large septal perforator  and a moderate sized diagonal branch.  There was 80% to 90% stenosis after  the septal perforator and diagonal branch in the mid LAD.   The circumflex artery gave rise to a small marginal branch, a large marginal  branch, and a small posterolateral branch.  These vessels were free of  disease.   The right coronary artery is a moderately large vessel, gave rise to a right  ventricular branch, posterior descending branch, and small posterolateral  branch.  There was a long 95% stenosis in the midportion of the vessel.   The left ventriculogram performed in the RAO projection showed good wall  motion, with no areas of hypokinesis. The estimated fraction of 60%.   Following stenting of the lesion in the mid right coronary, stenosis  improved from 95% to 0%.     The wire perforation and fistula from the posterolateral branch into the pericardium was sealed with a coil and at the end of  the procedure the  posterolateral branch was completely occluded about halfway down at the site  of the coil.   CONCLUSION:  1.  Coronary artery disease with recent non-ST of elevation myocardial      infarction with 80% to 90% stenosis in the mid LAD, no significant  obstruction of the circumflex artery, and a long 95% stenosis in the mid      right coronary with normal left ventricular function.  2.  Successful stenting of the lesion in the mid-right coronary artery with      two tandem overlapping Taxus stents, with improvement in sentinel      narrowing from 95% to 0% (the stenting procedure was complicated by      transient no flow).  3.  Wire perforation in the posterolateral branch of the right coronary      artery due to a hydrophilic wire in the presence of Integrilin, treated      with coil embolization of the posterolateral branch causing occlusion of      this branch.   DISPOSITION AND PLAN:  The patient is for further observation.  Will keep  her under close observation.  Repeat echo in two hours and obtain surgical  consultation for standby.   ADDENDUM:  The anticoagulation regimen used was a bivalirudin and the  patient was on Integrilin when she came to the laboratory.  She had not  received Lovenox since yesterday.           ______________________________  Charlies Constable, M.D. [facility]     BB/MEDQ  D:  02/04/2006  T:  02/04/2006  Job:  [id number]   cc:   Harl Bowie, M.D.  Fax: [phone]   Sharyne Peach  Fax: [phone]

## 2010-12-22 NOTE — H&P (Signed)
NAMEARIYANNAH, PAULING NO.:  [id number]   MEDICAL RECORD NO.:  [id number]          PATIENT TYPE:  INP   LOCATION:  3702                         FACILITY:  [facility]   PHYSICIAN:  Arvilla Meres, M.D. LHCDATE OF BIRTH:  1932-08-27   DATE OF ADMISSION:  02/01/2006  DATE OF DISCHARGE:                                HISTORY & PHYSICAL   CHIEF COMPLAINT:  Chest pain.   HISTORY OF PRESENT ILLNESS:  Ms. Markert is a 77 year old female with history  of diabetes mellitus, hypertension, hyperlipidemia, who presented from an  outside hospital emergency department.  Approximately at 11 a.m. today,  while walking, the patient had the onset of substernal chest discomfort and  pressure, radiation to the jaw, relieved by sublingual nitroglycerin at the  outside emergency department.  She had associated nausea and diaphoresis,  emesis, no shortness of breath.  The patient had no episodes of syncope or  presyncope, no palpitations.  Currently, the patient is chest-pain-free.  At  the outside hospital, the patient was given Lovenox, Integrilin, Lopressor,  aspirin and nitroglycerin.   PAST MEDICAL HISTORY:  1.  Diabetes mellitus.  2.  Autoimmune hepatitis.  3.  Hyperlipidemia.  4.  Hypertension.  5.  Osteoporosis.  6.  Cellulitis.  7.  Sciatica.   MEDICINES:  1.  Prednisone 10 mg p.o. daily.  2.  Welchol two tabs p.o. b.i.d.  3.  Zetia 10 mg p.o. daily.  4.  Insulin 70/30 -- 15 units subcu q.a.m.  5.  Methadone 10 mg p.o. b.i.d.  6.  Rapamune 2 mg p.o. daily.  7.  Lisinopril 5 mg p.o. daily.  8.  Lasix 40 mg p.o. p.r.n.   ALLERGIES:  CYCLOSPORIN.   SOCIAL HISTORY:  The patient lives in [place], works as a Veterinary surgeon, does not  smoke.   FAMILY HISTORY:  Father, with history of CHF, died at age 63.   REVIEW OF SYSTEMS:  All systems reviewed in detail and are negative except  as noted in the history of present illness.   PHYSICAL EXAMINATION:  VITAL SIGNS:  Blood pressure  110/70, heart rate 70,  oxygen saturation 97% on room air.  GENERAL:  A well-developed, well-nourished female, alert and oriented x3, in  no apparent distress.  HEENT:  Atraumatic, normocephalic.  Pupils equal, round and reactive to  light.  Extraocular movements intact.  Oropharynx clear.  Sclerae anicteric.  NECK:  Supple with no adenopathy, no JVD, no carotid bruit, no thyromegaly.  CHEST:  Lungs clear to auscultation bilaterally with equal bilateral breath  sounds.  CORONARY:  Regular rhythm, normal rate, normal S1 and S2, no murmurs, rubs,  or gallops, 2+ peripheral pulses, no femoral bruits.  ABDOMEN:  Soft, nontender and non-distended.  Active bowel sounds.  No  hepatosplenomegaly.  EXTREMITIES:  No clubbing, cyanosis, or edema.  NEUROLOGIC:  No focal deficits.  SKIN:  No rashes.  PSYCHIATRIC:  Normal affect.   LABORATORY DATA:  Labs from the outside hospital:  Hemoglobin A1c 6.8.  CK  122, CK-MB is 6.4; troponin 0.01, next set 0.33.  Sodium 140, potassium  3.7,  chloride 104, bicarb 22, BUN 29, creatinine 1.4, glucose 165.  BNP of 67.  INR of 1.  White blood cell count 13.2, hematocrit 36.4, platelet count  194,000.   IMPRESSION AND PLAN:  This 77 year old female with multiple cardiovascular  risk factors, presented with chest pain concerning for acute coronary  syndrome, troponin mildly elevated at the outside hospital.   PLAN:  1.  Cardiovascular:  Get an EKG, aspirin daily, Zetia, Welchol, ACE      inhibitor and beta blocker.  We will continue her on Lovenox and      Integrilin.  Serial cardiac enzymes q.8 h. x3 total.  Admit the patient      to a telemetry bed.  Lipid panel.  2.  Pulmonary:  Get a PA and lateral chest x-ray.  3.  Endocrine:  Hemoglobin A1c, home insulin dose, sliding-scale insulin,      thyroid function tests.  4.  Neurologic:  Continue the patient on her home dose of methadone.  5.  Renal:  Check a creatinine.  6.  Fluids, electrolytes and nutrition:   CMP, diabetic diet.  7.  GI:  Continue the patient on her home dose of predinsone and Rapamune.  8.  Hematologic:  Check CBC, coagulation panel.      Rod Holler, MD   Electronically Signed     ______________________________  Arvilla Meres, M.D. [facility]    TRK/MEDQ  D:  02/01/2006  T:  02/02/2006  Job:  [id number]

## 2011-01-25 ENCOUNTER — Other Ambulatory Visit: Payer: Self-pay | Admitting: *Deleted

## 2011-01-25 MED ORDER — FUROSEMIDE 40 MG PO TABS: ORAL_TABLET | ORAL | Status: DC

## 2011-01-29 ENCOUNTER — Other Ambulatory Visit: Payer: Self-pay | Admitting: Neurosurgery

## 2011-01-29 DIAGNOSIS — M5410 Radiculopathy, site unspecified: Secondary | ICD-10-CM

## 2011-01-29 DIAGNOSIS — M549 Dorsalgia, unspecified: Secondary | ICD-10-CM

## 2011-02-01 ENCOUNTER — Ambulatory Visit
Admission: RE | Admit: 2011-02-01 | Discharge: 2011-02-01 | Disposition: A | Discharge status: Home / Self Care | Payer: Medicare Other | Source: Ambulatory Visit | Attending: Neurosurgery | Admitting: Neurosurgery

## 2011-02-01 ENCOUNTER — Other Ambulatory Visit: Payer: Self-pay | Admitting: Neurosurgery

## 2011-02-01 DIAGNOSIS — M549 Dorsalgia, unspecified: Secondary | ICD-10-CM

## 2011-02-01 DIAGNOSIS — M5410 Radiculopathy, site unspecified: Secondary | ICD-10-CM

## 2011-07-02 ENCOUNTER — Encounter: Payer: Self-pay | Admitting: Cardiology

## 2011-07-02 ENCOUNTER — Ambulatory Visit (INDEPENDENT_AMBULATORY_CARE_PROVIDER_SITE_OTHER): Payer: Medicare Other | Admitting: Cardiology

## 2011-07-02 DIAGNOSIS — R0989 Other specified symptoms and signs involving the circulatory and respiratory systems: Secondary | ICD-10-CM | POA: Insufficient documentation

## 2011-07-02 DIAGNOSIS — R002 Palpitations: Secondary | ICD-10-CM

## 2011-07-02 DIAGNOSIS — E785 Hyperlipidemia, unspecified: Secondary | ICD-10-CM

## 2011-07-02 DIAGNOSIS — I2510 Atherosclerotic heart disease of native coronary artery without angina pectoris: Secondary | ICD-10-CM

## 2011-07-02 NOTE — Assessment & Plan Note (Signed)
No ischemic symptoms though not very active due to back pain.  Continue ASA 81 and bisoprolol.  Has not been able to tolerate statins due to leg weakness and liver disease.  I would like her to look into joining the [facility] to try to get some exercise.  She could try pool walking as this should be easy on her back. Would also try riding a stationary bike.

## 2011-07-02 NOTE — Progress Notes (Signed)
PCP: Dr. Donnel Saxon  77 yo with history of CAD s/p NSTEMI in 1/07 and autoimmune hepatitis presents for cardiology followup.  She has been followed by Drs. Juanda Chance and Kirke Corin in the past and is seen by me for the first time today.  She has been doing well lately.  She was having problems with palpitations earlier this year but had been off her bisoprolol.  She is back on bisoprolol now and is having no further palpitations.  Her main problem currently is back pain.  She has lumbar disc disease and sciatica.  The pain has significantly limited her walking, and she is now on methadone.  No chest pain, no exertional dyspnea (though she is not very active).  She gained some weight at Thanksgiving but has lost most of it already.  She has chronic right > left lower leg swelling due to venous insufficiency.  The lower legs are chronically discolored, probably from venous stasis dermatitis.   ECG: NSR with PACs, LAFB, poor anterior R wave progression.   PMH: 1. CAD: NSTEMI 1/07.  LHC showed 80-9i0% mid LAD stenosis and 95% mid RCA stenosis.  She had Taxus DES x 2 (overlapping) to the RCA.  Procedure was complicated by wire perforation of PLV, PLV was coil embolized.  Myoview 2009 showed no ischemia or infarction.   2. Hyperlipidemia: Unable to tolerate statins due to leg weakness and liver disease.   3. Autoimmune hepatitis: Followed at [facility].  On prednisone and sirolimus.   4. Lumbar disc disease/sciatica.  5. CKD 6. Chronic venous insufficiency with chronic R>L lower leg swelling.  7. Paroxysmal atrial fibrillation: Only documented episode was in 7/07.  8. SVT: suppressed by beta blocker.  9. Diastolic CHF: Echo (8/07) with EF 55%, mild MR  SH: Lives in [place], married, retired Scientist, research (physical sciences).  Never smoked.    FH: Noncontributory.   ROS: All systems reviewed and negative except as per HPI.   Current Outpatient Prescriptions  Medication Sig Dispense Refill  . Ascorbic Acid (VITAMIN C) 1000 MG  tablet Take 1,000 mg by mouth 2 (two) times daily.        Marland Kitchen aspirin 81 MG tablet Take 81 mg by mouth every other day.       . bisoprolol (ZEBETA) 5 MG tablet Take 5 mg by mouth daily.        . calcitonin, salmon, (MIACALCIN/FORTICAL) 200 UNIT/ACT nasal spray Take 1 spray by mouth every other day. Alternating nostrils      . calcium carbonate 200 MG capsule Take 1,000 mg by mouth 2 (two) times daily with a meal.        . Cholecalciferol (VITAMIN D PO) Take 5,000 Units by mouth daily.        . Chromium 500 MCG TABS Take 1 tablet by mouth daily.        . colesevelam (WELCHOL) 625 MG tablet Take 1,875 mg by mouth 2 (two) times daily with a meal. (take a total of 3 tabs by mouth twice a day)      . ezetimibe (ZETIA) 10 MG tablet Take 10 mg by mouth daily.        . finasteride (PROSCAR) 5 MG tablet Take 1/2 tab by mouth daily      . fish oil-omega-3 fatty acids 1000 MG capsule Take 2 g by mouth 2 (two) times daily.       . furosemide (LASIX) 40 MG tablet Take 1 1/2 tablet by mouth daily  45 tablet  6  . L-Methylfolate-B6-B12 (METANX) 3-35-2 MG TABS Take 1 tablet by mouth 2 (two) times daily.        . methadone (DOLOPHINE) 10 MG tablet Take 10 mg by mouth 2 (two) times daily.        . Multiple Vitamin (MULTIVITAMIN) capsule Take 1 capsule by mouth daily.        . predniSONE (DELTASONE) 10 MG tablet Take 10 mg by mouth daily.        . Selenium 200 MCG CAPS Take 1 capsule by mouth daily.        . sirolimus (RAPAMUNE) 1 MG tablet Take 1 mg by mouth every other day.         BP 130/70  Pulse 75  Ht 5\' 4"  (1.626 m)  Wt 62.143 kg (137 lb)  BMI 23.52 kg/m2 General: NAD Neck: No JVD, no thyromegaly or thyroid nodule.  Lungs: Clear to auscultation bilaterally with normal respiratory effort. CV: Nondisplaced PMI.  Heart regular S1/S2, no S3/S4, no murmur.  1+ edema to knees, right > left.  Soft left carotid bruit.  Normal pedal pulses.  Abdomen: Soft, nontender, no hepatosplenomegaly, no distention.    Skin: Erythema bilateral lower legs, chronic. Possible venous stasis dermatitis.   Neurologic: Alert and oriented x 3.  Psych: Normal affect. Extremities: No clubbing or cyanosis.

## 2011-07-02 NOTE — Assessment & Plan Note (Signed)
Resolved on bisoprolol.  She has been found to have PACs and short runs of SVT.  She has not had documented atrial fibrillation since 2007.

## 2011-07-02 NOTE — Assessment & Plan Note (Signed)
Unable to tolerate statins due to leg weakness and liver disease.  She is on Zetia and [employer].  Goal LDL < 70.  Will call PCP's office to get results from most recent lipids draw.

## 2011-07-02 NOTE — Patient Instructions (Signed)
Your physician has requested that you have a carotid duplex. This test is an ultrasound of the carotid arteries in your neck. It looks at blood flow through these arteries that supply the brain with blood. Allow one hour for this exam. There are no restrictions or special instructions.  Your physician wants you to follow-up in: 1 year with Dr Shirlee Latch. (November 2013). You will receive a reminder letter in the mail two months in advance. If you don't receive a letter, please call our office to schedule the follow-up appointment.

## 2011-07-02 NOTE — Assessment & Plan Note (Signed)
Soft left carotid bruit.  She hears "whooshing" in her left ear.  I will get carotid dopplers to evaluate for significant stenosis.

## 2011-07-03 ENCOUNTER — Encounter: Payer: Self-pay | Admitting: Cardiology

## 2011-07-11 ENCOUNTER — Encounter: Payer: Self-pay | Admitting: Cardiology

## 2011-07-17 NOTE — Progress Notes (Signed)
Patient ID: Cynthia Hicks, female   DOB: 10-Jul-1933, 77 y.o.   MRN: [id number] Holter monitor hook up

## 2011-07-23 ENCOUNTER — Encounter (INDEPENDENT_AMBULATORY_CARE_PROVIDER_SITE_OTHER): Payer: Medicare Other | Admitting: *Deleted

## 2011-07-23 DIAGNOSIS — R0989 Other specified symptoms and signs involving the circulatory and respiratory systems: Secondary | ICD-10-CM

## 2011-07-23 DIAGNOSIS — I6529 Occlusion and stenosis of unspecified carotid artery: Secondary | ICD-10-CM

## 2011-08-29 ENCOUNTER — Inpatient Hospital Stay (HOSPITAL_COMMUNITY)
Admission: RE | Admit: 2011-08-29 | Discharge: 2011-09-01 | DRG: 246 | Disposition: A | Discharge status: Home / Self Care | Payer: Medicare Other | Source: Other Acute Inpatient Hospital | Attending: Internal Medicine | Admitting: Internal Medicine

## 2011-08-29 ENCOUNTER — Encounter (HOSPITAL_COMMUNITY): Payer: Self-pay | Admitting: Nurse Practitioner

## 2011-08-29 ENCOUNTER — Encounter (HOSPITAL_COMMUNITY)
Admission: RE | Disposition: A | Discharge status: Home / Self Care | Payer: Self-pay | Source: Other Acute Inpatient Hospital | Attending: Internal Medicine

## 2011-08-29 ENCOUNTER — Telehealth: Payer: Self-pay | Admitting: Cardiology

## 2011-08-29 DIAGNOSIS — E119 Type 2 diabetes mellitus without complications: Secondary | ICD-10-CM | POA: Insufficient documentation

## 2011-08-29 DIAGNOSIS — I10 Essential (primary) hypertension: Secondary | ICD-10-CM | POA: Insufficient documentation

## 2011-08-29 DIAGNOSIS — IMO0002 Reserved for concepts with insufficient information to code with codable children: Secondary | ICD-10-CM

## 2011-08-29 DIAGNOSIS — I5021 Acute systolic (congestive) heart failure: Secondary | ICD-10-CM

## 2011-08-29 DIAGNOSIS — I2119 ST elevation (STEMI) myocardial infarction involving other coronary artery of inferior wall: Secondary | ICD-10-CM

## 2011-08-29 DIAGNOSIS — E782 Mixed hyperlipidemia: Secondary | ICD-10-CM | POA: Diagnosis present

## 2011-08-29 DIAGNOSIS — K7689 Other specified diseases of liver: Secondary | ICD-10-CM | POA: Insufficient documentation

## 2011-08-29 DIAGNOSIS — G8929 Other chronic pain: Secondary | ICD-10-CM | POA: Insufficient documentation

## 2011-08-29 DIAGNOSIS — I214 Non-ST elevation (NSTEMI) myocardial infarction: Secondary | ICD-10-CM

## 2011-08-29 DIAGNOSIS — I252 Old myocardial infarction: Secondary | ICD-10-CM

## 2011-08-29 DIAGNOSIS — K219 Gastro-esophageal reflux disease without esophagitis: Secondary | ICD-10-CM | POA: Insufficient documentation

## 2011-08-29 DIAGNOSIS — Z79899 Other long term (current) drug therapy: Secondary | ICD-10-CM

## 2011-08-29 DIAGNOSIS — M549 Dorsalgia, unspecified: Secondary | ICD-10-CM | POA: Diagnosis present

## 2011-08-29 DIAGNOSIS — I249 Acute ischemic heart disease, unspecified: Secondary | ICD-10-CM

## 2011-08-29 DIAGNOSIS — I509 Heart failure, unspecified: Secondary | ICD-10-CM | POA: Diagnosis present

## 2011-08-29 DIAGNOSIS — I129 Hypertensive chronic kidney disease with stage 1 through stage 4 chronic kidney disease, or unspecified chronic kidney disease: Secondary | ICD-10-CM | POA: Diagnosis present

## 2011-08-29 DIAGNOSIS — I2510 Atherosclerotic heart disease of native coronary artery without angina pectoris: Secondary | ICD-10-CM

## 2011-08-29 DIAGNOSIS — D638 Anemia in other chronic diseases classified elsewhere: Secondary | ICD-10-CM | POA: Insufficient documentation

## 2011-08-29 DIAGNOSIS — Z7902 Long term (current) use of antithrombotics/antiplatelets: Secondary | ICD-10-CM

## 2011-08-29 DIAGNOSIS — K754 Autoimmune hepatitis: Secondary | ICD-10-CM | POA: Diagnosis present

## 2011-08-29 DIAGNOSIS — I200 Unstable angina: Secondary | ICD-10-CM

## 2011-08-29 DIAGNOSIS — N189 Chronic kidney disease, unspecified: Secondary | ICD-10-CM | POA: Insufficient documentation

## 2011-08-29 DIAGNOSIS — E785 Hyperlipidemia, unspecified: Secondary | ICD-10-CM | POA: Insufficient documentation

## 2011-08-29 DIAGNOSIS — Z7982 Long term (current) use of aspirin: Secondary | ICD-10-CM

## 2011-08-29 HISTORY — PX: PERCUTANEOUS CORONARY STENT INTERVENTION (PCI-S): SHX5485

## 2011-08-29 HISTORY — PX: LEFT HEART CATHETERIZATION WITH CORONARY ANGIOGRAM: SHX5451

## 2011-08-29 HISTORY — DX: Dorsalgia, unspecified: M54.9

## 2011-08-29 HISTORY — DX: Other chronic pain: G89.29

## 2011-08-29 LAB — CARDIAC PANEL(CRET KIN+CKTOT+MB+TROPI)
CK, MB: 4.5 ng/mL — ABNORMAL HIGH (ref 0.3–4.0)
Relative Index: INVALID (ref 0.0–2.5)
Total CK: 47 U/L (ref 7–177)
Troponin I: 0.41 ng/mL (ref <0.30)

## 2011-08-29 LAB — MRSA PCR SCREENING: MRSA by PCR: NEGATIVE

## 2011-08-29 LAB — GLUCOSE, CAPILLARY: Glucose-Capillary: 155 mg/dL — ABNORMAL HIGH (ref 70–99)

## 2011-08-29 SURGERY — LEFT HEART CATHETERIZATION WITH CORONARY ANGIOGRAM
Anesthesia: LOCAL

## 2011-08-29 MED ORDER — NITROGLYCERIN IN D5W 200-5 MCG/ML-% IV SOLN
INTRAVENOUS | Status: AC
  Administered 2011-08-29: 14:00:00
  Filled 2011-08-29: qty 250

## 2011-08-29 MED ORDER — EZETIMIBE 10 MG PO TABS
10.0000 mg | ORAL_TABLET | Freq: Every day | ORAL | Status: DC
  Administered 2011-08-30 – 2011-09-01 (×3): 10 mg via ORAL
  Filled 2011-08-29 (×4): qty 1

## 2011-08-29 MED ORDER — ASPIRIN EC 81 MG PO TBEC
81.0000 mg | DELAYED_RELEASE_TABLET | Freq: Every day | ORAL | Status: DC
  Administered 2011-08-30 – 2011-09-01 (×3): 81 mg via ORAL
  Filled 2011-08-29 (×4): qty 1

## 2011-08-29 MED ORDER — SODIUM CHLORIDE 0.9 % IV SOLN
INTRAVENOUS | Status: AC
  Administered 2011-08-29: 17:00:00 via INTRAVENOUS

## 2011-08-29 MED ORDER — ONDANSETRON HCL 4 MG/2ML IJ SOLN: 4.0000 mg | Freq: Four times a day (QID) | INTRAMUSCULAR | Status: DC | PRN

## 2011-08-29 MED ORDER — FUROSEMIDE 40 MG PO TABS
60.0000 mg | ORAL_TABLET | Freq: Every day | ORAL | Status: DC
  Filled 2011-08-29: qty 1

## 2011-08-29 MED ORDER — MULTIVITAMINS PO CAPS
1.0000 | ORAL_CAPSULE | Freq: Every day | ORAL | Status: DC
Start: 2011-08-29 — End: 2011-08-29

## 2011-08-29 MED ORDER — HEPARIN (PORCINE) IN NACL 2-0.9 UNIT/ML-% IJ SOLN
INTRAMUSCULAR | Status: AC
  Filled 2011-08-29: qty 2000

## 2011-08-29 MED ORDER — PANTOPRAZOLE SODIUM 40 MG PO TBEC
40.0000 mg | DELAYED_RELEASE_TABLET | Freq: Every day | ORAL | Status: DC
  Administered 2011-08-30 – 2011-09-01 (×3): 40 mg via ORAL
  Filled 2011-08-29 (×3): qty 1

## 2011-08-29 MED ORDER — ADULT MULTIVITAMIN W/MINERALS CH
1.0000 | ORAL_TABLET | Freq: Every day | ORAL | Status: DC
  Administered 2011-08-30 – 2011-09-01 (×3): 1 via ORAL
  Filled 2011-08-29 (×3): qty 1

## 2011-08-29 MED ORDER — HEPARIN SOD (PORCINE) IN D5W 100 UNIT/ML IV SOLN
950.0000 [IU]/h | INTRAVENOUS | Status: DC
  Administered 2011-08-29: 950 [IU]/h via INTRAVENOUS
  Filled 2011-08-29: qty 250

## 2011-08-29 MED ORDER — CLOPIDOGREL BISULFATE 300 MG PO TABS
ORAL_TABLET | ORAL | Status: AC
  Administered 2011-08-30: 75 mg via ORAL
  Filled 2011-08-29: qty 2

## 2011-08-29 MED ORDER — MIDAZOLAM HCL 2 MG/2ML IJ SOLN
INTRAMUSCULAR | Status: AC
  Filled 2011-08-29: qty 2

## 2011-08-29 MED ORDER — INSULIN ASPART 100 UNIT/ML ~~LOC~~ SOLN
0.0000 [IU] | Freq: Three times a day (TID) | SUBCUTANEOUS | Status: DC
  Administered 2011-08-29: 8 [IU] via SUBCUTANEOUS
  Administered 2011-08-30: 5 [IU] via SUBCUTANEOUS
  Administered 2011-08-30: 8 [IU] via SUBCUTANEOUS
  Administered 2011-08-30: 3 [IU] via SUBCUTANEOUS
  Administered 2011-08-31: 5 [IU] via SUBCUTANEOUS
  Filled 2011-08-29: qty 3

## 2011-08-29 MED ORDER — VITAMIN C 500 MG PO TABS
1000.0000 mg | ORAL_TABLET | Freq: Two times a day (BID) | ORAL | Status: DC
  Administered 2011-08-29 – 2011-09-01 (×6): 1000 mg via ORAL
  Filled 2011-08-29 (×7): qty 2

## 2011-08-29 MED ORDER — METHADONE HCL 10 MG PO TABS
10.0000 mg | ORAL_TABLET | Freq: Two times a day (BID) | ORAL | Status: DC
  Administered 2011-08-29 – 2011-09-01 (×7): 10 mg via ORAL
  Filled 2011-08-29 (×7): qty 1

## 2011-08-29 MED ORDER — COLESEVELAM HCL 625 MG PO TABS
1875.0000 mg | ORAL_TABLET | Freq: Two times a day (BID) | ORAL | Status: DC
  Administered 2011-08-29 – 2011-09-01 (×7): 1875 mg via ORAL
  Filled 2011-08-29 (×8): qty 3

## 2011-08-29 MED ORDER — NITROGLYCERIN 0.4 MG SL SUBL: 0.4000 mg | SUBLINGUAL_TABLET | SUBLINGUAL | Status: DC | PRN

## 2011-08-29 MED ORDER — SODIUM CHLORIDE 0.9 % IV SOLN: 250.0000 mL | INTRAVENOUS | Status: DC | PRN

## 2011-08-29 MED ORDER — OMEGA-3-ACID ETHYL ESTERS 1 G PO CAPS
2.0000 g | ORAL_CAPSULE | Freq: Two times a day (BID) | ORAL | Status: DC
  Administered 2011-08-29 – 2011-09-01 (×6): 2 g via ORAL
  Filled 2011-08-29 (×9): qty 2

## 2011-08-29 MED ORDER — SIROLIMUS 1 MG PO TABS
1.0000 mg | ORAL_TABLET | ORAL | Status: DC
  Filled 2011-08-29: qty 1

## 2011-08-29 MED ORDER — ZOLPIDEM TARTRATE 5 MG PO TABS: 5.0000 mg | ORAL_TABLET | Freq: Every evening | ORAL | Status: DC | PRN

## 2011-08-29 MED ORDER — HYDROMORPHONE HCL PF 2 MG/ML IJ SOLN
INTRAMUSCULAR | Status: AC
  Filled 2011-08-29: qty 1

## 2011-08-29 MED ORDER — ASPIRIN 81 MG PO TABS: 81.0000 mg | ORAL_TABLET | ORAL | Status: DC

## 2011-08-29 MED ORDER — GI COCKTAIL ~~LOC~~
30.0000 mL | Freq: Once | ORAL | Status: DC
  Filled 2011-08-29: qty 30

## 2011-08-29 MED ORDER — SODIUM CHLORIDE 0.9 % IV SOLN
INTRAVENOUS | Status: DC
  Administered 2011-08-29: 15:00:00 via INTRAVENOUS

## 2011-08-29 MED ORDER — NITROGLYCERIN 0.2 MG/ML ON CALL CATH LAB
INTRAVENOUS | Status: AC
  Filled 2011-08-29: qty 1

## 2011-08-29 MED ORDER — CALCITONIN (SALMON) 200 UNIT/ACT NA SOLN
1.0000 | NASAL | Status: DC
  Administered 2011-08-31: 1 via NASAL
  Filled 2011-08-29: qty 3.7

## 2011-08-29 MED ORDER — ACETAMINOPHEN 325 MG PO TABS
650.0000 mg | ORAL_TABLET | ORAL | Status: DC | PRN
  Administered 2011-08-30: 650 mg via ORAL
  Filled 2011-08-29: qty 2

## 2011-08-29 MED ORDER — OMEGA-3 FATTY ACIDS 1000 MG PO CAPS: 2.0000 g | ORAL_CAPSULE | Freq: Two times a day (BID) | ORAL | Status: DC

## 2011-08-29 MED ORDER — FAMOTIDINE IN NACL 20-0.9 MG/50ML-% IV SOLN
INTRAVENOUS | Status: AC
  Filled 2011-08-29: qty 50

## 2011-08-29 MED ORDER — ALPRAZOLAM 0.25 MG PO TABS
0.2500 mg | ORAL_TABLET | Freq: Two times a day (BID) | ORAL | Status: DC | PRN
  Administered 2011-08-29 – 2011-08-30 (×2): 0.25 mg via ORAL
  Filled 2011-08-29 (×2): qty 1

## 2011-08-29 MED ORDER — CALCIUM CARBONATE ANTACID 500 MG PO CHEW
400.0000 mg | CHEWABLE_TABLET | Freq: Two times a day (BID) | ORAL | Status: DC
  Administered 2011-08-29 – 2011-09-01 (×7): 400 mg via ORAL
  Filled 2011-08-29 (×8): qty 2

## 2011-08-29 MED ORDER — METANX 3-35-2 MG PO TABS
1.0000 | ORAL_TABLET | Freq: Two times a day (BID) | ORAL | Status: DC
  Administered 2011-08-29 – 2011-09-01 (×6): 1 via ORAL
  Filled 2011-08-29 (×8): qty 1

## 2011-08-29 MED ORDER — LABETALOL HCL 5 MG/ML IV SOLN
INTRAVENOUS | Status: AC
  Filled 2011-08-29: qty 4

## 2011-08-29 MED ORDER — PREDNISONE 10 MG PO TABS
10.0000 mg | ORAL_TABLET | Freq: Every day | ORAL | Status: DC
  Administered 2011-08-30 – 2011-09-01 (×3): 10 mg via ORAL
  Filled 2011-08-29 (×5): qty 1

## 2011-08-29 MED ORDER — BIVALIRUDIN 250 MG IV SOLR
INTRAVENOUS | Status: AC
  Filled 2011-08-29: qty 250

## 2011-08-29 MED ORDER — NITROGLYCERIN IN D5W 200-5 MCG/ML-% IV SOLN
20.0000 ug/min | INTRAVENOUS | Status: DC
  Administered 2011-08-29: 20 ug/min via INTRAVENOUS

## 2011-08-29 MED ORDER — SODIUM CHLORIDE 0.9 % IJ SOLN: 3.0000 mL | Freq: Two times a day (BID) | INTRAMUSCULAR | Status: DC

## 2011-08-29 MED ORDER — ASPIRIN 81 MG PO CHEW: 324.0000 mg | CHEWABLE_TABLET | ORAL | Status: DC

## 2011-08-29 MED ORDER — INSULIN ASPART 100 UNIT/ML ~~LOC~~ SOLN: 0.0000 [IU] | Freq: Every day | SUBCUTANEOUS | Status: DC

## 2011-08-29 MED ORDER — CALCIUM CARBONATE 200 MG PO CAPS: 1000.0000 mg | ORAL_CAPSULE | Freq: Two times a day (BID) | ORAL | Status: DC

## 2011-08-29 MED ORDER — SODIUM CHLORIDE 0.9 % IV SOLN: INTRAVENOUS | Status: DC

## 2011-08-29 MED ORDER — ONDANSETRON HCL 4 MG/2ML IJ SOLN
INTRAMUSCULAR | Status: AC
  Filled 2011-08-29: qty 2

## 2011-08-29 MED ORDER — BISOPROLOL FUMARATE 5 MG PO TABS
5.0000 mg | ORAL_TABLET | Freq: Every day | ORAL | Status: DC
  Administered 2011-08-30 – 2011-09-01 (×3): 5 mg via ORAL
  Filled 2011-08-29 (×3): qty 1

## 2011-08-29 MED ORDER — LIDOCAINE HCL (PF) 1 % IJ SOLN
INTRAMUSCULAR | Status: AC
  Filled 2011-08-29: qty 30

## 2011-08-29 MED ORDER — SODIUM CHLORIDE 0.9 % IJ SOLN: 3.0000 mL | INTRAMUSCULAR | Status: DC | PRN

## 2011-08-29 MED ORDER — CLOPIDOGREL BISULFATE 75 MG PO TABS
75.0000 mg | ORAL_TABLET | Freq: Every day | ORAL | Status: DC
  Administered 2011-08-30 – 2011-08-31 (×2): 75 mg via ORAL
  Filled 2011-08-29 (×2): qty 1

## 2011-08-29 MED FILL — Heparin Sodium (Porcine) 100 Unt/ML in Sodium Chloride 0.45%: INTRAMUSCULAR | Qty: 250 | Status: AC

## 2011-08-29 NOTE — H&P (Signed)
Patient ID: Cynthia Hicks MRN: [id number], DOB/AGE: [birth year]   Admit date: 08/29/2011   Primary Physician: Galvin Proffer, MD, MD Primary Cardiologist: [facility]  Pt. Profile:   78 y/o female with prior h/o cad presents on transfer from [facility] 2/2 on going c/p and abnl stress test.  Problem List: Past Medical History  Diagnosis Date  . Rapid heart rate 11/04/2010  . Hypertension   . Arthritis   . Diabetes mellitus   . Coronary artery disease 2007    A.  MI 01/2003;  B.  02/2006 NSTEMI - LAD 80-65m, LCX nl, RCA 36m - RCA stented w/ Taxus DES;   C.  2009 Neg MV;   D. 11/2010 Echo - EF 65%;  E. 08/2011 - nstemi, MV @ [facility] showed inflat isch and EF 37%  . Autoimmune Liver Disease     A.  Followed @ [facility] - on chronic prednisone and  sirolimus  . Hyperlipidemia     inability to stake statins due to liver disease  . Cellulitis     chronic cellulitis of LE  . Chronic kidney disease   . Bulging disc   . Sciatica   . Chronic back pain     A.  On Methadone    Past Surgical History  Procedure Date  . Cardiac catheterization     Normal EF  . Coronary angioplasty 2007    2 DES placement to RCA complicated by a wire perforation  . Back surgery 1992  . Tubal ligation 1982     Allergies:  Allergies  Allergen Reactions  . Cephalosporins     Blasted bone marrow    HPI:   78 y/o female w/ prior h/o cad s/p 2 des to RCA in 2007.  Pt had residual LAD dzs which has been medically managed.  Her PCI in 2007 was complicated by perf and subsequently RP bleed.  She had a non-ischemic myoview in 2009.  On Sunday 1/20, while resting @ home, pt had sudden onset of sscp radiating up into her teeth. This persisted about and resolved spont.  She had recurrent chest pain on 1/21, prompting her to present to [facility].  There, her ECG was non-acute and her troponin's remained mildly elevated with a flat trend in the "gray zone" (defined as being between 0.04 and  0.40).  She was seen by cardiology and decision was made to pursue myoview stress testing.  This was performed yesterday and showed inflat ischemia with and EF of 37%.  She has cont to have intermittent chest discomfort and decision was made to transfer her to [facility] for further eval and cath.  Currently she reports mild right sided chest discomfort.    Home Medications  Medications Prior to Admission  Medication Sig Dispense Refill  . Ascorbic Acid (VITAMIN C) 1000 MG tablet Take 1,000 mg by mouth 2 (two) times daily.       Marland Kitchen aspirin 81 MG tablet Take 81 mg by mouth every other day.       . bisoprolol (ZEBETA) 5 MG tablet Take 5 mg by mouth daily.        . calcitonin, salmon, (MIACALCIN/FORTICAL) 200 UNIT/ACT nasal spray Take 1 spray by mouth every other day. Alternating nostrils      . calcium carbonate 200 MG capsule Take 1,000 mg by mouth 2 (two) times daily with a meal.       . Cholecalciferol (VITAMIN D PO) Take 5,000 Units by  mouth daily.        . Chromium 500 MCG TABS Take 1 tablet by mouth daily.        . colesevelam (WELCHOL) 625 MG tablet Take 1,875 mg by mouth 2 (two) times daily with a meal. (take a total of 3 tabs by mouth twice a day)      . ezetimibe (ZETIA) 10 MG tablet Take 10 mg by mouth daily.        . finasteride (PROSCAR) 5 MG tablet Take 1/2 tab by mouth daily      . fish oil-omega-3 fatty acids 1000 MG capsule Take 2 g by mouth 2 (two) times daily.       . furosemide (LASIX) 40 MG tablet Take 1 1/2 tablet by mouth daily  45 tablet  6  . L-Methylfolate-B6-B12 (METANX) 3-35-2 MG TABS Take 1 tablet by mouth 2 (two) times daily.        . methadone (DOLOPHINE) 10 MG tablet Take 10 mg by mouth 2 (two) times daily.        . Multiple Vitamin (MULTIVITAMIN) capsule Take 1 capsule by mouth daily.        . predniSONE (DELTASONE) 10 MG tablet Take 10 mg by mouth daily.        . Selenium 200 MCG CAPS Take 1 capsule by mouth daily.        . sirolimus (RAPAMUNE) 1 MG tablet Take 1 mg  by mouth every other day.           Family History  Problem Relation Age of Onset  . Cancer Mother 66    lung and pancreatic  . Heart failure Father 57    (m.i.)  . Heart disease Maternal Grandfather      History   Social History  . Marital Status: Married    Spouse Name: John    Number of Children: N/A  . Years of Education: N/A   Occupational History  . real Therapist, occupational     retired   Social History Main Topics  . Smoking status: Never Smoker   . Smokeless tobacco: Not on file  . Alcohol Use: No  . Drug Use: No  . Sexually Active:    Other Topics Concern  . Not on file   Social History Narrative   Pt lives in [place] with husband.  She is retired.  She is not very active @ home.     Review of Systems: General: occas chills.  No fever, night sweats or weight changes.  Cardiovascular: ++ for chest pain.  No dyspnea on exertion, edema, orthopnea, palpitations, paroxysmal nocturnal dyspnea. Dermatological: negative for rash Respiratory: negative for cough or wheezing Urologic: negative for hematuria Abdominal: negative for nausea, vomiting, diarrhea, bright red blood per rectum, melena, or hematemesis Neurologic: negative for visual changes, syncope, or dizziness MSK:  Chronic back pain All other systems reviewed and are otherwise negative except as noted above.  Physical Exam: Blood pressure 93/56, pulse 72, temperature 98.2 F (36.8 C), temperature source Oral, resp. rate 16, height 5\' 3"  (1.6 m), weight 140 lb 6.9 oz (63.7 kg), SpO2 94.00%.  General: Well developed, well nourished, in no acute distress. Head: Normocephalic, atraumatic, sclera non-icteric, no xanthomas, nares are without discharge.  Neck: JVP 8-9 cm Lungs:  Resp regular and unlabored, CTA. Heart: RRR no s3, s4, or murmurs. Abdomen: Soft, non-tender, non-distended, BS + x 4.  Msk:  Strength and tone appears normal for age. Extremities: No clubbing, cyanosis or  edema. DP/PT/Radials 2+ and  equal bilaterally. Neuro: Alert and oriented X 3. Moves all extremities spontaneously. Psych: Normal affect.   Labs:   Results for orders placed during the hospital encounter of 08/29/11 (from the past 72 hour(s))  GLUCOSE, CAPILLARY     Status: Abnormal   Collection Time   08/29/11  1:54 PM      Component Value Range Comment   Glucose-Capillary 254 (*) 70 - 99 (mg/dL)    Comment 1 Documented in Chart      Comment 2 Notify RN        Radiology/Studies: No results found.  EKG:  RSR poor r prog, twi I, aVL.  ASSESSMENT AND PLAN:   1.  ACS/CAD:  Pt presents on transfer from [place] with ongoing intermittent chest discomfort.  Troponins have been borderline and flat.  We will plan to perform diagnostic cath today.  She has known residual LAD dzs and is s/p 2 des to RCA in 2007.  Cont asa, bb, heparin, ntg.  Pt is intolerant to statins - cont welchol/zetia.  2.  HTN:  Stable.  3.  HL:  Intol to statins.  Cont home meds.  4.  Autoimmune Hepatitis:  Cont home meds.  5.  ? ICM:  Ef down on myoview.  Check echo.  Volume looks good.  Cont home dose of lasix.  6.  Gerd/esophagitis:  Pt has had some indigestion Ss recently.  Will give GI cocktail and add PPI.   Signed, Nicolasa Ducking, NP 08/29/2011, 2:48 PM   Patient seen with NP, agree with note. Patient has had on and off chest pain since Sunday.  She has chest pain 5/10 currently.  Pain is similar to prior ischemic pain.  Troponin level at [facility] falls into the "gray zone" for their hospital.  She had a Lexiscan myoview yesterday with inferolateral ischemia and EF 37%.  Prior EF by echo here was 65%.  Neck veins mildly elevated on exam.  Of note, on last cath in 2007, unable to access right femoral and had RP hematoma after left femoral access.  Also had PLV perforation.  At that time, she had 99% RCA stenosis and 80-90% LAD stenosis.  The RCA was treated but the LAD was medically managed.  2009 stress test showed no  ischemia or infarction.   1. Chest pain: ACS (?NSTEMI, need to check enzymes here) most likely, less likely GI-related (has been having dysphagia/food sticking and GERD).  - Given ongoing CP, will need cath today.  Would use radial access given difficulty with femoral access on prior cath.  - Continue heparin gtt, add NTG gtt, continue ASA and bisoprolol.  - She has not been able to take statin due to myalgias and liver dysfunction (autoimmune hepatitis).  Continue Zetia and Welchol.  2. CHF: EF 37% by myoview, neck veins may be mildly elevated.  Will get echocardiogram.   3. Autoimmune hepatitis: Continue prednisone and sirolimus at home doses.  4. Back pain: Methadone at home dose.  5. GI: Reports food sticking in throat, some GERD symptoms.  Will try GI cocktail for pain.  Will start Protonix and will eventually need GI evaluation with EGD.   [name] 08/29/2011 3:09 PM

## 2011-08-29 NOTE — Op Note (Signed)
Cardiac Catheterization Procedure Note  Name: Cynthia Hicks MRN: [id number] DOB: 26-Feb-1933  Procedure: Left Heart Cath, Selective Coronary Angiography, LV angiography,  PTCA/Stent of mid  left circumflex with a drug-eluting stent. Perclose closure device.  Indication: This is a 78 year old female with known history of coronary artery disease status post angioplasty in 2 drug-eluting stent placement to the right coronary artery in 2007. That procedure was complicated by wire dissection and perforation. She also had retroperitoneal bleed. She presented with severe chest pain and went to [facility]. Her cardiac enzymes were borderline elevated. She underwent a nuclear stress test which showed a large inferolateral defect with reduced LV systolic function. The patient was transferred for cardiac catheterization.   Medications:  Sedation:  1 mg IV Versed, 2 mg of Dilaudid IV  Contrast:  155 ML Omnipaque  Diagnostic Procedure Details: The right groin was prepped, draped, and anesthetized with 1% lidocaine. Using the modified Seldinger technique, a 5 French sheath was introduced into the right femoral artery. Standard Judkins catheters were used for selective coronary angiography and left ventriculography. Catheter exchanges were performed over a wire.  The diagnostic procedure was well-tolerated without immediate complications.  Procedural Findings:  Hemodynamics: AO:  180/92   mmHg LV:  175/25    mmHg LVEDP: 29  mmHg  Coronary angiography: Coronary dominance: Codominant   Left Main:  Normal in size and free of significant disease.  Left Anterior Descending (LAD):  Normal in size. There is a 20% ostial stenosis. After the second diagonal there is diffuse 80% stenosis in the LAD. The distal LAD has 30% tubular stenosis as well. The vessel wraps around the apex.  1st diagonal (D1):  Medium in size with 30% ostial stenosis.  2nd diagonal (D2):  Large in size with 40% ostial stenosis.  There are 2 septal perforator branches at the second diagonal which are medium in size with mild ostial disease.  3rd diagonal (D3):  Very small in size.  Circumflex (LCx):  The vessel is normal in size. There is minor irregularities proximally. In the midsegment after OM1, there is a 99% hazy stenosis which appears to be the culprit for recent anginal symptoms. The rest of the vessel has minor irregularities. The AV groove artery originate just distal to the stenosis and has 50% ostial disease.  1st obtuse marginal:  Small in size with 70% ostial stenosis.  2nd obtuse marginal:  Normal in size with minor irregularities.  3rd obtuse marginal:  Normal in size with minor irregularities.   Right Coronary Artery: The vessel is normal in size. There is diffuse 50% disease proximally. 2 overlapped stents are noted in the mid segment with diffuse 40-50% stenosis. Distally, the vessel has diffuse 70% disease before giving the PDA. There is what seems to be a coil likely from previous intervention on a perforation.  posterior descending artery: Normal in size with mild atherosclerosis but no obstructive disease.  Left ventriculography: Left ventricular systolic function is mildly reduced , LVEF is estimated at 40 %, there is no significant mitral regurgitation   PCI Procedure Note:  Following the diagnostic procedure, the decision was made to proceed with PCI. The sheath was upsized to a 6 [place]. Weight-based bivalirudin was given for anticoagulation. 600 mg Plavix was given. Once a therapeutic ACT was achieved, a 6 [place] XB 3.5 guide catheter was inserted.  A intuition coronary guidewire was used to cross the lesion.  The lesion was predilated with a 2.5 x 12 mm balloon.  The  lesion was then stented with a 2.5 x 16 mm Promus element drug-eluting stent stent.  The stent was postdilated with a 2.5 noncompliant balloon.  Following PCI, there was 0% residual stenosis and TIMI-3 flow. The AV groove artery was  sealed with a stent in the stenosis ostially worsened to 80% but with TIMI 3 flow. Thus, no intervention on the branch was performed. Final angiography confirmed an excellent result. Femoral hemostasis was achieved with Perclose device.  The patient tolerated the PCI procedure well. There were no immediate procedural complications.  The patient was transferred to the post catheterization recovery area for further monitoring.  PCI Data: Vessel - LCx/Segment - mid Percent Stenosis (pre)  99 TIMI-flow 3 Stent 2.5 x 16 Promus drug-eluting stent Percent Stenosis (post) 0% TIMI-flow (post) 3  Final Conclusions:  1. Significant three-vessel coronary artery disease. The culprit is a 99% stenosis in the mid left circumflex. 2. Mildly reduced LV systolic function. 3. Severe systemic hypertension and moderately elevated left ventricular end-diastolic pressure. 4. Successful angioplasty and drug-eluting stent placement to the mid left circumflex.   Recommendations:  This was a very difficult case as the patient was having significant chest and back discomfort even before starting the case. Her pain was not controlled with nitroglycerin or Dilaudid. She had severe chest tightness. Initially, I considered referring the patient for coronary artery bypass graft surgery given that she had three-vessel coronary artery disease. However, there was urgency and intervening on the left circumflex due to her symptoms and also the patient has autoimmune liver disease and has been on immunosuppression and prednisone for a long time. Thus, I felt that she would be a poor candidate for surgery. The plan was to proceed with PCI on both left circumflex and LAD. However, the patient continued to have severe back pain which worsened and this is a chronic problem for her. Thus, I decided to intervene on the left circumflex and bring her back for an LAD PCI based on her symptoms. In the meantime, I recommend continuing  nitroglycerin drip to control her blood pressure. Continue dual antiplatelet therapy for at least 12 months.  Lorine Bears MD, [facility] 08/29/2011, 4:41 PM

## 2011-08-29 NOTE — Telephone Encounter (Signed)
Spoke with Dr. Shirlee Latch and he will cal  Pt's husband.

## 2011-08-29 NOTE — Progress Notes (Signed)
ANTICOAGULATION CONSULT NOTE - Initial Consult  Pharmacy Consult for heparin Indication: chest pain/ACS  Allergies  Allergen Reactions  . Cephalosporins     Blasted bone marrow    Patient Measurements: Height: 5\' 3"  (160 cm) Weight: 140 lb 6.9 oz (63.7 kg) IBW/kg (Calculated) : 52.4    Vital Signs: Temp: 98.2 F (36.8 C) (01/23 1400) Temp src: Oral (01/23 1400) BP: 93/56 mmHg (01/23 1430) Pulse Rate: 72  (01/23 1430)  Labs: No results found for this basename: HGB:2,HCT:3,PLT:3,APTT:3,LABPROT:3,INR:3,HEPARINUNFRC:3,CREATININE:3,CKTOTAL:3,CKMB:3,TROPONINI:3 in the last 72 hours Estimated Creatinine Clearance: 26.9 ml/min (by C-G formula based on Cr of 1.6).  Medical History: Past Medical History  Diagnosis Date  . Rapid heart rate 11/04/2010  . Hypertension   . Arthritis   . Diabetes mellitus   . Coronary artery disease 2007    A.  MI 01/2003;  B.  02/2006 NSTEMI - LAD 80-10m, LCX nl, RCA 73m - RCA stented w/ Taxus DES;   C.  2009 Neg MV;   D. 11/2010 Echo - EF 65%;  E. 08/2011 - nstemi, MV @ [facility] showed inflat isch and EF 37%  . Autoimmune Liver Disease     A.  Followed @ [facility] - on chronic prednisone and  sirolimus  . Hyperlipidemia     inability to stake statins due to liver disease  . Cellulitis     chronic cellulitis of LE  . Chronic kidney disease   . Bulging disc   . Sciatica   . Chronic back pain     A.  On Methadone    Medications:  Infusions:    . sodium chloride 62 mL/hr at 08/29/11 1444  . heparin 950 Units/hr (08/29/11 1444)  . nitroGLYCERIN 20 mcg/min (08/29/11 1445)  . DISCONTD: sodium chloride      Assessment: 78 yof being transferred from [facility] for cardiac cath procedure. Patient transferred to [facility] on IV heparin at 950 units/hr. It appears aptt was drawn this am with a result of 53. Cath lab has called for patient so no levels are indicated. No abnormalities were noted in cbc.  Goal of Therapy:  Heparin level 0.3-0.7 units/ml     Plan:  Continue IV heparin at 950/hr Heparin to be turned off on call to cath. F/u after cath.  Severiano Gilbert 08/29/2011,2:52 PM

## 2011-08-29 NOTE — Telephone Encounter (Signed)
New Problem   Patient Husband Cynthia Hicks called to report patient is [facility] in [place] and requires Cardiac Cath.  Please return call ASAP on hm#

## 2011-08-29 NOTE — Interval H&P Note (Signed)
History and Physical Interval Note:  08/29/2011 3:24 PM  Cynthia Hicks  has presented today for surgery, with the diagnosis of Chest pain  The various methods of treatment have been discussed with the patient. After consideration of risks, benefits and other options for treatment, the patient has consented to  Procedure(s): LEFT HEART CATHETERIZATION WITH CORONARY ANGIOGRAM as a surgical intervention .  The patients' history has been reviewed, patient examined, no change in status, stable for surgery.  I have reviewed the patients' chart and labs.  Questions were answered to the patient's satisfaction.     Lorine Bears

## 2011-08-30 ENCOUNTER — Inpatient Hospital Stay (HOSPITAL_COMMUNITY): Payer: Medicare Other

## 2011-08-30 DIAGNOSIS — I214 Non-ST elevation (NSTEMI) myocardial infarction: Secondary | ICD-10-CM

## 2011-08-30 DIAGNOSIS — I517 Cardiomegaly: Secondary | ICD-10-CM

## 2011-08-30 DIAGNOSIS — I509 Heart failure, unspecified: Secondary | ICD-10-CM

## 2011-08-30 DIAGNOSIS — I5021 Acute systolic (congestive) heart failure: Secondary | ICD-10-CM

## 2011-08-30 LAB — BASIC METABOLIC PANEL
BUN: 14 mg/dL (ref 6–23)
Calcium: 8.9 mg/dL (ref 8.4–10.5)
Creatinine, Ser: 1.08 mg/dL (ref 0.50–1.10)
GFR calc Af Amer: 56 mL/min — ABNORMAL LOW (ref >90)
GFR calc non Af Amer: 49 mL/min — ABNORMAL LOW (ref >90)
Potassium: 4.2 mEq/L (ref 3.5–5.1)

## 2011-08-30 LAB — URINALYSIS, ROUTINE W REFLEX MICROSCOPIC
Bilirubin Urine: NEGATIVE
Hgb urine dipstick: NEGATIVE
Protein, ur: NEGATIVE mg/dL
Specific Gravity, Urine: 1.014 (ref 1.005–1.030)
Urobilinogen, UA: 0.2 mg/dL (ref 0.0–1.0)

## 2011-08-30 LAB — URINE MICROSCOPIC-ADD ON

## 2011-08-30 LAB — CBC
HCT: 33.7 % — ABNORMAL LOW (ref 36.0–46.0)
MCHC: 32.3 g/dL (ref 30.0–36.0)
MCV: 97.4 fL (ref 78.0–100.0)
RDW: 15.9 % — ABNORMAL HIGH (ref 11.5–15.5)

## 2011-08-30 LAB — GLUCOSE, CAPILLARY: Glucose-Capillary: 146 mg/dL — ABNORMAL HIGH (ref 70–99)

## 2011-08-30 MED ORDER — OXYCODONE-ACETAMINOPHEN 5-325 MG PO TABS
2.0000 | ORAL_TABLET | ORAL | Status: DC | PRN
  Administered 2011-08-30 – 2011-08-31 (×2): 2 via ORAL
  Filled 2011-08-30 (×2): qty 2

## 2011-08-30 MED ORDER — POTASSIUM CHLORIDE CRYS ER 20 MEQ PO TBCR
20.0000 meq | EXTENDED_RELEASE_TABLET | Freq: Every day | ORAL | Status: DC
  Administered 2011-08-30 – 2011-09-01 (×3): 20 meq via ORAL
  Filled 2011-08-30 (×3): qty 1

## 2011-08-30 MED ORDER — FUROSEMIDE 10 MG/ML IJ SOLN
40.0000 mg | Freq: Two times a day (BID) | INTRAMUSCULAR | Status: DC
  Administered 2011-08-30 (×2): 40 mg via INTRAVENOUS
  Filled 2011-08-30 (×5): qty 4

## 2011-08-30 MED ORDER — LISINOPRIL 5 MG PO TABS
5.0000 mg | ORAL_TABLET | Freq: Every day | ORAL | Status: DC
  Administered 2011-08-30: 5 mg via ORAL
  Filled 2011-08-30 (×2): qty 1

## 2011-08-30 MED ORDER — SODIUM CHLORIDE 0.9 % IV SOLN: INTRAVENOUS | Status: DC

## 2011-08-30 MED ORDER — SIROLIMUS 1 MG PO TABS
1.0000 mg | ORAL_TABLET | ORAL | Status: DC
  Administered 2011-08-31: 1 mg via ORAL
  Filled 2011-08-30: qty 1

## 2011-08-30 MED ORDER — SIMVASTATIN 5 MG PO TABS
5.0000 mg | ORAL_TABLET | Freq: Every day | ORAL | Status: DC
  Administered 2011-08-30 – 2011-09-01 (×3): 5 mg via ORAL
  Filled 2011-08-30 (×3): qty 1

## 2011-08-30 MED FILL — Dextrose Inj 5%: INTRAVENOUS | Qty: 50 | Status: AC

## 2011-08-30 NOTE — Progress Notes (Signed)
CARDIAC REHAB PHASE I   PRE:  Rate/Rhythm: 77SR  BP:  Supine:   Sitting: 97/48  Standing:    SaO2: 91%RA  MODE:  Ambulation: 170 ft   POST:  Rate/Rhythem: 106ST  BP:  Supine:   Sitting:   Standing: 128/47   SaO2: 90%RA  Put on o2 at 2L for chest pressure 1340- 1430 Began MI ed with pt. Walked 170 ft on RA with rolling walker and asst x1. Tired by end of walk. To recliner with call bell.Pt.c/o chest pressure after walking but denied any pain. Put pt on O2 at 2L and it was relieved. Talking with visitor when I left.  Duanne Limerick

## 2011-08-30 NOTE — Progress Notes (Signed)
  Echocardiogram 2D Echocardiogram has been performed.  Cynthia Hicks [name] 08/30/2011, 10:17 AM

## 2011-08-30 NOTE — Progress Notes (Signed)
Patient ID: Cynthia Hicks, female   DOB: 09/01/1932, 78 y.o.   MRN: [id number]    SUBJECTIVE:  No further chest pain.  Still having back and shoulder pain.  Denies dyspnea at rest though sats a bit low on room air (90%).      Marland Kitchen aspirin EC  81 mg Oral Daily  . bisoprolol  5 mg Oral Daily  . bivalirudin      . calcitonin (salmon)  1 spray Alternating Nares QODAY  . calcium carbonate  400 mg of elemental calcium Oral BID WC  . clopidogrel      . clopidogrel  75 mg Oral Q breakfast  . colesevelam  1,875 mg Oral BID WC  . ezetimibe  10 mg Oral Daily  . famotidine      . furosemide  40 mg Intravenous BID  . gi cocktail  30 mL Oral Once  . heparin      . HYDROmorphone      . insulin aspart  0-15 Units Subcutaneous TID WC  . insulin aspart  0-5 Units Subcutaneous QHS  . lidocaine      . lisinopril  5 mg Oral Daily  . METANX  1 tablet Oral BID  . methadone  10 mg Oral BID  . midazolam      . mulitivitamin with minerals  1 tablet Oral Daily  . nitroGLYCERIN      . nitroGLYCERIN      . omega-3 acid ethyl esters  2 g Oral BID  . ondansetron      . pantoprazole  40 mg Oral Q0600  . potassium chloride  20 mEq Oral Daily  . predniSONE  10 mg Oral Daily  . simvastatin  5 mg Oral q1800  . sirolimus  1 mg Oral QODAY  . vitamin C  1,000 mg Oral BID  . DISCONTD: aspirin  324 mg Oral Pre-Cath  . DISCONTD: aspirin  81 mg Oral QODAY  . DISCONTD: calcium carbonate  1,000 mg Oral BID WC  . DISCONTD: fish oil-omega-3 fatty acids  2 g Oral BID  . DISCONTD: furosemide  60 mg Oral Daily  . DISCONTD: multivitamin  1 capsule Oral Daily  . DISCONTD: sodium chloride  3 mL Intravenous Q12H     Filed Vitals:   08/30/11 0403 08/30/11 0500 08/30/11 0725 08/30/11 0735  BP: 138/59  111/47   Pulse:   79 83  Temp: 100.8 F (38.2 C)   98.3 F (36.8 C)  TempSrc: Oral   Oral  Resp: 18  20 17   Height:      Weight:  62.1 kg (136 lb 14.5 oz)    SpO2: 97%  93% 90%    Intake/Output Summary (Last 24  hours) at 08/30/11 0813 Last data filed at 08/30/11 0700  Gross per 24 hour  Intake 1496.12 ml  Output   2400 ml  Net -903.88 ml    LABS: Basic Metabolic Panel:  Basename 08/30/11 0525  NA 137  K 4.2  CL 105  CO2 25  GLUCOSE 165*  BUN 14  CREATININE 1.08  CALCIUM 8.9  MG --  PHOS --   Liver Function Tests: No results found for this basename: AST:2,ALT:2,ALKPHOS:2,BILITOT:2,PROT:2,ALBUMIN:2 in the last 72 hours No results found for this basename: LIPASE:2,AMYLASE:2 in the last 72 hours CBC:  Basename 08/30/11 0525  WBC 10.8*  NEUTROABS --  HGB 10.9*  HCT 33.7*  MCV 97.4  PLT 166   Cardiac Enzymes:  Basename 08/29/11 2355 08/29/11  1744  CKTOTAL 57 47  CKMB 6.2* 4.5*  CKMBINDEX -- --  TROPONINI 2.07* 0.41*   RADIOLOGY: No results found.  PHYSICAL EXAM General: NAD Neck: JVP 8-9 cm, no thyromegaly or thyroid nodule.  Lungs: Slight crackles at bases bilaterally.  CV: Nondisplaced PMI.  Heart regular S1/S2, no S3/S4, no murmur.  1+ chronic edema lower legs bilaterally.   Abdomen: Soft, nontender, no hepatosplenomegaly, no distention.  Neurologic: Alert and oriented x 3.  Psych: Normal affect. Extremities: No clubbing or cyanosis.   TELEMETRY: Reviewed telemetry pt in NSR  ASSESSMENT AND PLAN: 78 yo with history of CAD, chronic back pain, and autoimmune hepatitis presented with NSTEMI.  1. CAD: Status post PCI to 99% mid CFX (culprit) with DES.  Chest pain resolved.  - Continue ASA and Plavix, bisoprolol, add ACEI.   - Will try her on low dose statin (myalgias with Lipitor and Crestor).  Will plan pravastatin 20 with coenzyme Q10 at home (Zocor 5 here).   - Has residual chronic 80% mid LAD stenosis (was present on 2007 cath).  Will follow this symptomatically.  - Out of bed with rehab.  2. CHF: Acute systolic CHF.  EF 40% by LV-gram.  She is mildly volume overloaded on exam with decreased oxygen saturation.  - Gentle diuresis with Lasix 40 mg IV bid.  -  Continue bisoprolol, add lisinopril 5 mg daily.  - Echo 3. Autoimmune hepatitis: Check LFTs, continue home meds for this.  4. Fever: Low grade.  CXR and UA.    [name] 08/30/2011 8:18 AM

## 2011-08-31 LAB — CBC
HCT: 31.5 % — ABNORMAL LOW (ref 36.0–46.0)
MCH: 31.4 pg (ref 26.0–34.0)
MCHC: 32.4 g/dL (ref 30.0–36.0)
MCV: 96.9 fL (ref 78.0–100.0)
RDW: 16 % — ABNORMAL HIGH (ref 11.5–15.5)

## 2011-08-31 LAB — COMPREHENSIVE METABOLIC PANEL
ALT: 23 U/L (ref 0–35)
AST: 22 U/L (ref 0–37)
Alkaline Phosphatase: 49 U/L (ref 39–117)
GFR calc Af Amer: 32 mL/min — ABNORMAL LOW (ref >90)
Glucose, Bld: 121 mg/dL — ABNORMAL HIGH (ref 70–99)
Potassium: 4.3 mEq/L (ref 3.5–5.1)
Sodium: 137 mEq/L (ref 135–145)
Total Protein: 6.5 g/dL (ref 6.0–8.3)

## 2011-08-31 LAB — GLUCOSE, CAPILLARY
Glucose-Capillary: 107 mg/dL — ABNORMAL HIGH (ref 70–99)
Glucose-Capillary: 166 mg/dL — ABNORMAL HIGH (ref 70–99)

## 2011-08-31 MED ORDER — LISINOPRIL 2.5 MG PO TABS
2.5000 mg | ORAL_TABLET | Freq: Every day | ORAL | Status: DC
  Administered 2011-08-31: 10:00:00 via ORAL
  Administered 2011-09-01: 2.5 mg via ORAL
  Filled 2011-08-31 (×2): qty 1

## 2011-08-31 MED ORDER — BISACODYL 10 MG RE SUPP
10.0000 mg | Freq: Every day | RECTAL | Status: DC | PRN
Start: 2011-08-31 — End: 2011-09-01

## 2011-08-31 MED ORDER — DOCUSATE SODIUM 100 MG PO CAPS
100.0000 mg | ORAL_CAPSULE | Freq: Two times a day (BID) | ORAL | Status: DC
  Administered 2011-08-31 – 2011-09-01 (×3): 100 mg via ORAL
  Filled 2011-08-31 (×4): qty 1

## 2011-08-31 NOTE — Progress Notes (Addendum)
[id number] Cardiac Rehab Completed Mi education with pt. She has many limitation with exercise  due to her back problems. Pt is interested in trying Rolator to see if it makes her walking easier.We will try this with her tomorrow. She is willing to try Outpt. [facility] in [place], will send referral. We did discuss other options for a cardiovascular workout such as water exercising.

## 2011-08-31 NOTE — Progress Notes (Signed)
.  CARDIAC REHAB PHASE I   PRE:  Rate/Rhythm: 78 SR with PVC  BP:  Supine:   Sitting: 116/52  Standing:    SaO2: 91 RA  MODE:  Ambulation: 100 ft   POST:  Rate/Rhythem: 103 ST with PVC  BP:  Supine:   Sitting: 133/48  Standing:    SaO2: 93% RA  Patient ambulated 100 ft x1 assist. Patient complaints of back pain and shoulder pain. Vital signs stable with ambulation. Patient being transferred to another unit. Will follow up with patient education tomorrow 08/31/2010. Thank you for the referral.   BROWN, Liridona Mashaw L

## 2011-08-31 NOTE — Progress Notes (Signed)
Patient ID: Cynthia Hicks, female   DOB: [birth year], 78 y.o.   MRN: [id number]     SUBJECTIVE:  No further chest pain.  Still having back and shoulder pain.  No shortness of breath but got tired with walk.  Oxygen saturation within normal limits now on room air after diuresis yesterday but creatinine is up.      Marland Kitchen aspirin EC  81 mg Oral Daily  . bisoprolol  5 mg Oral Daily  . calcitonin (salmon)  1 spray Alternating Nares QODAY  . calcium carbonate  400 mg of elemental calcium Oral BID WC  . clopidogrel  75 mg Oral Q breakfast  . colesevelam  1,875 mg Oral BID WC  . ezetimibe  10 mg Oral Daily  . gi cocktail  30 mL Oral Once  . insulin aspart  0-15 Units Subcutaneous TID WC  . insulin aspart  0-5 Units Subcutaneous QHS  . lisinopril  2.5 mg Oral Daily  . multivitamin  1 tablet Oral BID  . methadone  10 mg Oral BID  . mulitivitamin with minerals  1 tablet Oral Daily  . omega-3 acid ethyl esters  2 g Oral BID  . pantoprazole  40 mg Oral Q0600  . potassium chloride  20 mEq Oral Daily  . predniSONE  10 mg Oral Daily  . simvastatin  5 mg Oral q1800  . sirolimus  1 mg Oral QODAY  . vitamin C  1,000 mg Oral BID  . DISCONTD: furosemide  40 mg Intravenous BID  . DISCONTD: furosemide  60 mg Oral Daily  . DISCONTD: lisinopril  5 mg Oral Daily  . DISCONTD: sirolimus  1 mg Oral QODAY     Filed Vitals:   08/30/11 2000 08/31/11 0000 08/31/11 0400 08/31/11 0500  BP: 93/44 93/42 106/45   Pulse: 64 66 67   Temp: 97.3 F (36.3 C) 97.9 F (36.6 C) 98.2 F (36.8 C)   TempSrc: Oral Oral Oral   Resp: 12  14   Height:      Weight:   62 kg (136 lb 11 oz) 62.6 kg (138 lb 0.1 oz)  SpO2: 94% 93% 97%     Intake/Output Summary (Last 24 hours) at 08/31/11 0754 Last data filed at 08/31/11 0100  Gross per 24 hour  Intake   1040 ml  Output   1275 ml  Net   -235 ml    LABS: Basic Metabolic Panel:  Basename 08/31/11 0515 08/30/11 0525  NA 137 137  K 4.3 4.2  CL 104 105  CO2 26 25    GLUCOSE 121* 165*  BUN 25* 14  CREATININE 1.74* 1.08  CALCIUM 9.1 8.9  MG -- --  PHOS -- --   Liver Function Tests:  [facility] 08/31/11 0515  AST 22  ALT 23  ALKPHOS 49  BILITOT 0.4  PROT 6.5  ALBUMIN 2.3*   No results found for this basename: LIPASE:2,AMYLASE:2 in the last 72 hours CBC:  Basename 08/31/11 0515 08/30/11 0525  WBC 10.9* 10.8*  NEUTROABS -- --  HGB 10.2* 10.9*  HCT 31.5* 33.7*  MCV 96.9 97.4  PLT 149* 166   Cardiac Enzymes:  Basename 08/29/11 2355 08/29/11 1744  CKTOTAL 57 47  CKMB 6.2* 4.5*  CKMBINDEX -- --  TROPONINI 2.07* 0.41*   RADIOLOGY: No results found.  PHYSICAL EXAM General: NAD Neck: JVP 8 cm, no thyromegaly or thyroid nodule.  Lungs: Slight crackles at bases bilaterally.  CV: Nondisplaced PMI.  Heart regular S1/S2,  no S3/S4, no murmur.  1+ ankle edema (improved)   Abdomen: Soft, nontender, no hepatosplenomegaly, no distention.  Neurologic: Alert and oriented x 3.  Psych: Normal affect. Extremities: No clubbing or cyanosis.   TELEMETRY: Reviewed telemetry pt in NSR  ASSESSMENT AND PLAN: 78 yo with history of CAD, chronic back pain, and autoimmune hepatitis presented with NSTEMI.  1. CAD: Status post PCI to 99% mid CFX (culprit) with DES.  Chest pain resolved.  - Continue ASA and Plavix, bisoprolol, ACEI (but will decrease lisinopril to 2.5 with increased creatinine.   - Will try her on low dose statin (myalgias with Lipitor and Crestor).  Will plan pravastatin 20 with coenzyme Q10 at home (Zocor 5 here).   - Has residual chronic 80% mid LAD stenosis (was present on 2007 cath).  Will follow this symptomatically.  - Out of bed with rehab.  2. CHF: Acute systolic CHF.  EF 40% by LV-gram, 40-45% by echo.  She diuresed well on IV Lasix yesterday with improved oxygen saturation and breathing. - Hold Lasix today with increased creatinine, resume po Lasix tomorrow if creatinine stable or improved. - Continue bisoprolol, decrease  lisinopril to 2.5 mg daily with increased creatinine.  3. Autoimmune hepatitis: LFTs normal, continue home meds for this.  4. Fever: Resolved.  CXR with no PNA and UA negative.  5. To telemetry today.  If doing well tomorrow can go home.    [name] 08/31/2011 7:54 AM

## 2011-09-01 LAB — GLUCOSE, CAPILLARY
Glucose-Capillary: 113 mg/dL — ABNORMAL HIGH (ref 70–99)
Glucose-Capillary: 191 mg/dL — ABNORMAL HIGH (ref 70–99)

## 2011-09-01 LAB — BASIC METABOLIC PANEL
BUN: 41 mg/dL — ABNORMAL HIGH (ref 6–23)
CO2: 23 mEq/L (ref 19–32)
Chloride: 105 mEq/L (ref 96–112)
Creatinine, Ser: 1.95 mg/dL — ABNORMAL HIGH (ref 0.50–1.10)
Glucose, Bld: 111 mg/dL — ABNORMAL HIGH (ref 70–99)

## 2011-09-01 MED ORDER — CLOPIDOGREL BISULFATE 75 MG PO TABS
75.0000 mg | ORAL_TABLET | Freq: Every day | ORAL | Status: DC
  Administered 2011-09-01: 75 mg via ORAL
  Filled 2011-09-01 (×2): qty 1

## 2011-09-01 MED ORDER — INSULIN ASPART 100 UNIT/ML ~~LOC~~ SOLN
0.0000 [IU] | Freq: Three times a day (TID) | SUBCUTANEOUS | Status: DC
  Administered 2011-09-01: 8 [IU] via SUBCUTANEOUS
  Administered 2011-09-01: 3 [IU] via SUBCUTANEOUS

## 2011-09-01 MED ORDER — NITROGLYCERIN 0.4 MG SL SUBL: 0.4000 mg | SUBLINGUAL_TABLET | SUBLINGUAL | Status: DC | PRN

## 2011-09-01 MED ORDER — CLOPIDOGREL BISULFATE 75 MG PO TABS: 75.0000 mg | ORAL_TABLET | Freq: Every day | ORAL | Status: DC

## 2011-09-01 MED ORDER — PRAVASTATIN SODIUM 20 MG PO TABS: 20.0000 mg | ORAL_TABLET | Freq: Every day | ORAL | Status: DC

## 2011-09-01 MED ORDER — COENZYME Q-10 100 MG PO CAPS: 200.0000 mg | ORAL_CAPSULE | Freq: Every day | ORAL | Status: AC

## 2011-09-01 NOTE — Progress Notes (Signed)
Patient ID: Cynthia Hicks, female   DOB: November 08, 1932, 78 y.o.   MRN: [id number]     SUBJECTIVE:  No further chest pain.  Still having chronic back and shoulder pain.  Walked today, less fatigued.  Creatinine up more, she is now off Lasix and lisinopril.     Marland Kitchen aspirin EC  81 mg Oral Daily  . bisoprolol  5 mg Oral Daily  . calcitonin (salmon)  1 spray Alternating Nares QODAY  . calcium carbonate  400 mg of elemental calcium Oral BID WC  . colesevelam  1,875 mg Oral BID WC  . docusate sodium  100 mg Oral BID  . ezetimibe  10 mg Oral Daily  . gi cocktail  30 mL Oral Once  . insulin aspart  0-15 Units Subcutaneous TID WC  . insulin aspart  0-5 Units Subcutaneous QHS  . multivitamin  1 tablet Oral BID  . methadone  10 mg Oral BID  . mulitivitamin with minerals  1 tablet Oral Daily  . omega-3 acid ethyl esters  2 g Oral BID  . pantoprazole  40 mg Oral Q0600  . potassium chloride  20 mEq Oral Daily  . predniSONE  10 mg Oral Daily  . simvastatin  5 mg Oral q1800  . sirolimus  1 mg Oral QODAY  . vitamin C  1,000 mg Oral BID  . DISCONTD: insulin aspart  0-15 Units Subcutaneous TID WC  . DISCONTD: lisinopril  2.5 mg Oral Daily  clopidogrel 75 mg daily   Filed Vitals:   08/31/11 1400 08/31/11 2208 09/01/11 0603 09/01/11 1006  BP: 114/58 111/51 93/57 108/52  Pulse: 97 58 59   Temp: 98.2 F (36.8 C) 97.7 F (36.5 C) 98.1 F (36.7 C)   TempSrc: Oral Oral Oral   Resp: 20 20 18    Height:      Weight:   62.4 kg (137 lb 9.1 oz)   SpO2: 94% 93% 92%     Intake/Output Summary (Last 24 hours) at 09/01/11 1348 Last data filed at 09/01/11 1000  Gross per 24 hour  Intake    480 ml  Output   1250 ml  Net   -770 ml    LABS: Basic Metabolic Panel:  Basename 09/01/11 0630 08/31/11 0515  NA 136 137  K 4.4 4.3  CL 105 104  CO2 23 26  GLUCOSE 111* 121*  BUN 41* 25*  CREATININE 1.95* 1.74*  CALCIUM 9.7 9.1  MG -- --  PHOS -- --   Liver Function Tests:  [facility] 08/31/11 0515    AST 22  ALT 23  ALKPHOS 49  BILITOT 0.4  PROT 6.5  ALBUMIN 2.3*   No results found for this basename: LIPASE:2,AMYLASE:2 in the last 72 hours CBC:  Basename 08/31/11 0515 08/30/11 0525  WBC 10.9* 10.8*  NEUTROABS -- --  HGB 10.2* 10.9*  HCT 31.5* 33.7*  MCV 96.9 97.4  PLT 149* 166   Cardiac Enzymes:  Basename 08/29/11 2355 08/29/11 1744  CKTOTAL 57 47  CKMB 6.2* 4.5*  CKMBINDEX -- --  TROPONINI 2.07* 0.41*   RADIOLOGY: No results found.  PHYSICAL EXAM General: NAD Neck: JVP 7 cm, no thyromegaly or thyroid nodule.  Lungs: Slight crackles at bases bilaterally.  CV: Nondisplaced PMI.  Heart regular S1/S2, no S3/S4, no murmur.  1+ ankle edema (improved)   Abdomen: Soft, nontender, no hepatosplenomegaly, no distention.  Neurologic: Alert and oriented x 3.  Psych: Normal affect. Extremities: No clubbing or cyanosis.  TELEMETRY: Reviewed telemetry pt in NSR  ASSESSMENT AND PLAN: 78 yo with history of CAD, chronic back pain, and autoimmune hepatitis presented with NSTEMI.  1. CAD: Status post PCI to 99% mid CFX (culprit) with DES.  Chest pain resolved.  - Continue ASA and Plavix, bisoprolol - Off ACEI with increased creatinine - Will try her on low dose statin (myalgias with Lipitor and Crestor).  Will plan pravastatin 20 with coenzyme Q10 at home (Zocor 5 here).   - Has residual chronic 80% mid LAD stenosis (was present on 2007 cath).  Will follow this symptomatically.  - cardiac rehab 2. CHF: Acute systolic CHF.  EF 40% by LV-gram, 40-45% by echo.  She diuresed well on IV Lasix  with improved oxygen saturation and breathing. - Lasix and lisinopril on hold with increased creatinine. - Continue bisoprolol 3. Autoimmune hepatitis: LFTs normal, continue home meds for this.  4. Fever: Resolved.  CXR with no PNA and UA negative.  5. Dispo: Home today.  Needs BMET at PCP's office in [place] next week to be faxed to me. Followup with me in about 1 week.  Cardiac meds:  bisoprolol 5 mg daily, ASA 81, Plavix 75, pravastatin 20 daily with coenzyme Q10 200 mg daily.   [name] 09/01/2011 1:48 PM

## 2011-09-01 NOTE — Discharge Summary (Signed)
Patient ID: Cynthia Hicks,  MRN: [id number], DOB/AGE: 1932-11-20 78 y.o.  Admit date: 08/29/2011 Discharge date: 09/01/2011  Primary Care Provider: Carlisle Beers Primary Cardiologist: [facility]  Discharge Diagnoses Principal Problem:  *ACS (acute coronary syndrome) Active Problems:  DIABETES MELLITUS, TYPE II  HYPERLIPIDEMIA-MIXED  ANEMIA OF CHRONIC DISEASE  HYPERTENSION  GERD  Coronary artery disease  Chronic back pain  Chronic kidney disease  Autoimmune Liver Disease  NSTEMI (non-ST elevated myocardial infarction)  Systolic CHF, acute   Allergies Allergies  Allergen Reactions  . Cephalosporins     Blasted bone marrow    Procedures  08/29/2011 Cardiac Cath and PCI/DES to LCX Coronary dominance: Codominant     Left Main:  Normal in size and free of significant disease.   Left Anterior Descending (LAD):  Normal in size. There is a 20% ostial stenosis. After the second diagonal there is diffuse 80% stenosis in the LAD. The distal LAD has 30% tubular stenosis as well. The vessel wraps around the apex.   1st diagonal (D1):  Medium in size with 30% ostial stenosis.   2nd diagonal (D2):  Large in size with 40% ostial stenosis. There are 2 septal perforator branches at the second diagonal which are medium in size with mild ostial disease.   3rd diagonal (D3):  Very small in size.   Circumflex (LCx):  The vessel is normal in size. There is minor irregularities proximally. In the midsegment after OM1, there is a 99% hazy stenosis which appears to be the culprit for recent anginal symptoms. The rest of the vessel has minor irregularities. The AV groove artery originate just distal to the stenosis and has 50% ostial disease.   1st obtuse marginal:  Small in size with 70% ostial stenosis.   2nd obtuse marginal:  Normal in size with minor irregularities.  3rd obtuse marginal:  Normal in size with minor irregularities.       Right Coronary Artery: The vessel is normal in size. There  is diffuse 50% disease proximally. 2 overlapped stents are noted in the mid segment with diffuse 40-50% stenosis. Distally, the vessel has diffuse 70% disease before giving the PDA. There is what seems to be a coil likely from previous intervention on a perforation.   posterior descending artery: Normal in size with mild atherosclerosis but no obstructive disease.  Left ventriculography: Left ventricular systolic function is mildly reduced , LVEF is estimated at 40 %, there is no significant mitral regurgitation        ** The LCX was successfully stented using a 2.5 x 16 mm Promus element drug-eluting stent ** ___________________________________________________________________________________________  08/30/2011 2D Echo Study Conclusions  - Left ventricle: Technically limitted study. There is severe hypokinesis of the inferolateral wall. There is some hypokinesis of the lateral wall. The EF is in the 40-45% range, but difficult to be sure. The cavity size was normal. Wall thickness was increased in a pattern of mild LVH. - Aortic valve: Sclerosis without stenosis. - Right atrium: The atrium was mildly dilated. ____________________________________________________________________________________________  History of Present Illness  78 year old female with the above complex problem list who was in her usual state of health January 20 she began to experience substernal chest discomfort with radiation to her teeth. This initially lasted 10 minutes and resolve spontaneously. On January 21st she had recurrent chest pain prompting her present [facility] where she was subsequently admitted and noted to have mild elevation of prominence in the 0.3-0.4 range which is considered in the gray zone  I [facility] standards. Patient was seen by cardiology underwent Myoview stress testing which showed inferolateral ischemia with an EF of 37%. She was transferred to [facility] for further  evaluation.  Hospital Course   Upon arrival to [facility] patient continued to complain of intermittent chest discomfort. In light of her abnormal stress test it was felt that she would require urgent catheterization. This took place on January 23 revealing a new 99% stenosis in the mid left circumflex with otherwise moderate nonobstructive coronary artery disease. It should be noted that patient has diffuse 80% stenosis in the LAD which was unchanged from prior angiography. The left circumflex was felt to be the culprit vessel and this was successfully stented using a 2.5 x 16 mm Promus element drug-eluting stent. Patient tolerated procedure well however post procedure did complain of dyspnea and was noted to have room air saturations in the low 90s. She was felt to have mild volume overload on exam and she was placed on intravenous Lasix and also low-dose lisinopril therapy. Patient diuresed well however she bumped her creatinine 1.74 resulting in discontinuation of Lasix and reduction of dose of lisinopril. Unfortunately her creatinine has bumped further this morning to 1.95 and her lisinopril has been discontinued also.  Following diuresis, patient's dyspnea improved. She has not had any recurrence of chest pain but she does have chronic back and shoulder pain for which he is managed with methadone. She has been seen by physical therapy as well as cardiac rehabilitation and they have recommended a rolling walker at the time of discharge. Plan to discharge patient home today in good condition. Because of her elevated creatinine, she will require a followup basic metabolic panel primary care provider mid week. We will arrange followup with Dr. Shirlee Latch in approximately one week.   Discharge Vitals:  Blood pressure 108/52, pulse 59, temperature 98.1 F (36.7 C), temperature source Oral, resp. rate 18, height 5\' 3"  (1.6 m), weight 137 lb 9.1 oz (62.4 kg), SpO2 92.00%.   Labs: CBC:  Basename 08/31/11  0515 08/30/11 0525  WBC 10.9* 10.8*  NEUTROABS -- --  HGB 10.2* 10.9*  HCT 31.5* 33.7*  MCV 96.9 97.4  PLT 149* 166   Basic Metabolic Panel:  Basename 09/01/11 0630 08/31/11 0515  NA 136 137  K 4.4 4.3  CL 105 104  CO2 23 26  GLUCOSE 111* 121*  BUN 41* 25*  CREATININE 1.95* 1.74*  CALCIUM 9.7 9.1  MG -- --  PHOS -- --   Liver Function Tests:  [facility] 08/31/11 0515  AST 22  ALT 23  ALKPHOS 49  BILITOT 0.4  PROT 6.5  ALBUMIN 2.3*   Cardiac Enzymes:  Basename 08/29/11 2355 08/29/11 1744  CKTOTAL 57 47  CKMB 6.2* 4.5*  CKMBINDEX -- --  TROPONINI 2.07* 0.41*   Disposition:  Follow-up Information    Follow up with HAGUE, Myrene Galas, MD. (need blood chemistry in 1 week)    Contact information:   [address] [place] [address] [phone]       Follow up with [name], MD. (approx 1 week, we will arrange)    Contact information:   [address] [place] [address] [phone]          Discharge Medications:  Medication List  As of 09/01/2011  2:48 PM   STOP taking these medications         colesevelam 625 MG tablet  furosemide 40 MG tablet         TAKE these medications         aspirin 81 MG tablet   Take 81 mg by mouth every other day.      bisoprolol 5 MG tablet   Commonly known as: ZEBETA   Take 5 mg by mouth daily.      calcitonin (salmon) 200 UNIT/ACT nasal spray   Commonly known as: MIACALCIN/FORTICAL   Take 1 spray by mouth every other day. Alternating nostrils      calcium carbonate 200 MG capsule   Take 1,000 mg by mouth 2 (two) times daily with a meal.      Chromium 500 MCG Tabs   Take 1 tablet by mouth daily.      clopidogrel 75 MG tablet   Commonly known as: PLAVIX   Take 1 tablet (75 mg total) by mouth daily with breakfast.      Coenzyme Q-10 100 MG capsule   Take 2 capsules (200 mg total) by mouth daily.      ezetimibe 10 MG tablet     Commonly known as: ZETIA   Take 10 mg by mouth daily.      finasteride 5 MG tablet   Commonly known as: PROSCAR   Take 1/2 tab by mouth daily      fish oil-omega-3 fatty acids 1000 MG capsule   Take 2 g by mouth 2 (two) times daily.      insulin lispro 100 UNIT/ML injection   Commonly known as: HUMALOG   Inject 10 Units into the skin 3 (three) times daily before meals.      methadone 10 MG tablet   Commonly known as: DOLOPHINE   Take 10 mg by mouth 2 (two) times daily.      multivitamin 3-35-2 MG Tabs tablet   Take 1 tablet by mouth 2 (two) times daily.      multivitamin capsule   Take 1 capsule by mouth daily.      nitroGLYCERIN 0.4 MG SL tablet   Commonly known as: NITROSTAT   Place 1 tablet (0.4 mg total) under the tongue every 5 (five) minutes as needed for chest pain.      pravastatin 20 MG tablet   Commonly known as: PRAVACHOL   Take 1 tablet (20 mg total) by mouth daily.      predniSONE 10 MG tablet   Commonly known as: DELTASONE   Take 10 mg by mouth daily.      RAPAMUNE 1 MG tablet   Generic drug: sirolimus   Take 1 mg by mouth every other day.      Selenium 200 MCG Caps   Take 1 capsule by mouth daily.      vitamin C 1000 MG tablet   Take 1,000 mg by mouth 2 (two) times daily.      VITAMIN D PO   Take 5,000 Units by mouth daily.            Outstanding Labs/Studies  F/u bmet on 1/29.  Duration of Discharge Encounter: Greater than 30 minutes including physician time.  Signed, Nicolasa Ducking NP 09/01/2011, 2:48 PM

## 2011-09-01 NOTE — Progress Notes (Signed)
CARDIAC REHAB PHASE I   PRE:  Rate/Rhythm: 80 SR (in BR)    BP: sitting in BR    SaO2: 94 RA  MODE:  Ambulation: 300 ft   POST:  Rate/Rhythm: 76    BP: sitting 119/62     SaO2: 94 RA  Pt used rollator RW and ambulated a long distance for her (per pt). Sts she felt stronger with it and is interested in receiving a rollator RW for home use. Please order if agree. No CP/SOB.  [id number]  Cynthia Hicks CES, ACSM

## 2011-09-01 NOTE — Progress Notes (Signed)
   CARE MANAGEMENT NOTE 09/01/2011  Patient:  Cynthia Hicks, Cynthia Hicks   Account Number:  [id number]  Date Initiated:  09/01/2011  Documentation initiated by:  [facility]  Subjective/Objective Assessment:   NSTEMI, chest pain, Hepatitis     Action/Plan:   Anticipated DC Date:  09/01/2011   Anticipated DC Plan:  HOME/SELF CARE      DC Planning Services  CM consult      Choice offered to / List presented to:             Status of service:  In process, will continue to follow Medicare Important Message given?   (If response is "NO", the following Medicare IM given date fields will be blank) Date Medicare IM given:   Date Additional Medicare IM given:    Discharge Disposition:    Per UR Regulation:    Comments:  09/01/2011 1500 Contacted [facility] for Rollator for scheduled d/c today. Isidoro Donning RN CCM Case Mmgt phone [phone]

## 2011-09-06 ENCOUNTER — Encounter: Payer: Self-pay | Admitting: Cardiology

## 2011-09-13 ENCOUNTER — Ambulatory Visit (INDEPENDENT_AMBULATORY_CARE_PROVIDER_SITE_OTHER): Payer: Medicare Other | Admitting: Cardiology

## 2011-09-13 ENCOUNTER — Encounter: Payer: Self-pay | Admitting: Cardiology

## 2011-09-13 DIAGNOSIS — I509 Heart failure, unspecified: Secondary | ICD-10-CM

## 2011-09-13 DIAGNOSIS — I2510 Atherosclerotic heart disease of native coronary artery without angina pectoris: Secondary | ICD-10-CM

## 2011-09-13 DIAGNOSIS — E785 Hyperlipidemia, unspecified: Secondary | ICD-10-CM

## 2011-09-13 DIAGNOSIS — I5022 Chronic systolic (congestive) heart failure: Secondary | ICD-10-CM

## 2011-09-13 MED ORDER — CLOPIDOGREL BISULFATE 75 MG PO TABS: 75.0000 mg | ORAL_TABLET | Freq: Every day | ORAL | Status: DC

## 2011-09-13 MED ORDER — PRAVASTATIN SODIUM 40 MG PO TABS: 40.0000 mg | ORAL_TABLET | Freq: Every evening | ORAL | Status: DC

## 2011-09-13 MED ORDER — LOSARTAN POTASSIUM 25 MG PO TABS: ORAL_TABLET | ORAL | Status: DC

## 2011-09-13 NOTE — Patient Instructions (Signed)
Start losartan 12.5mg  daily--this will be one-half 25mg  tablet daily.  Increase pravachol to 40mg  daily in the evening.  Call Cardiac Rehab at [facility] to begin Cardiac Rehab. [phone]  Follow a low potassium diet.  Your physician has requested that you have a lexiscan myoview. For further information please visit [email or web address] Please follow instruction sheet, as given. IN 1 WEEK  Your physician recommends that you return for lab work in: 1 week--BMET/Liver profile  Ask Dr Ardelle Park to fax your cholesterol results to Dr Shirlee Latch when you have this done in March 2013.  Your physician recommends that you schedule a follow-up appointment in: 2 months with Dr Shirlee Latch.   Your physician has requested that you have an echocardiogram. Echocardiography is a painless test that uses sound waves to create images of your heart. It provides your doctor with information about the size and shape of your heart and how well your heart's chambers and valves are working. This procedure takes approximately one hour. There are no restrictions for this procedure. END OF April 2013

## 2011-09-15 DIAGNOSIS — I5022 Chronic systolic (congestive) heart failure: Secondary | ICD-10-CM | POA: Insufficient documentation

## 2011-09-15 NOTE — Assessment & Plan Note (Signed)
As he has tolerated pravastatin 20, I will increase it to 40 mg daily.  Lipids/LFTs with PCP in 3/13.

## 2011-09-15 NOTE — Assessment & Plan Note (Signed)
EF 40-45% post-MI. No significant dyspnea though she is not very active.  Continue bisoprolol at 5 mg daily.  Creatinine down to 1.6.  I will start losartan 12.5 mg daily.  BMET in 1 week.  Will repeat echo about 3 months post-revascularization.

## 2011-09-15 NOTE — Assessment & Plan Note (Addendum)
NSTEMI with recent PROMUS DES to CFX.  Has residual 80% mid LAD stenosis.  This was also seen on 2007 cath.   Cynthia Hicks to assess functional significance of LAD stenosis.  - ASA, Plavix, bisoprolol.  Tolerating pravastatin 20 mg daily.  - Start cardiac rehab at [facility].

## 2011-09-15 NOTE — Progress Notes (Signed)
PCP: Dr. Donnel Saxon  78 yo with history of CAD and autoimmune hepatitis presents for cardiology followup.  She was admitted in 1/13 with NSTEMI.  Left heart cath showed 80% mid LAD stenosis and 99% mid CFX stenosis (likely culprit).  EF was 40-45% by echo and LV-gram.  She had a PROMUS DES to the CFX.  No intervention to the LAD.    Since discharge, she has been stable.  She continues to have back pain from lumbar disc disease and sciatica.  The pain has significantly limited her walking, and she is now on methadone.   She walks for 10 minutes 3 times a day.  She is using a walker because of the back pain.  No chest pain, no dyspnea.  She has some mild ankle edema.    ECG: NSR, LAFB, poor anterior R wave progression, LVH, anterolateral T wave inversions.   PMH: 1. CAD: NSTEMI 1/07.  LHC showed 80-90% mid LAD stenosis and 95% mid RCA stenosis.  She had Taxus DES x 2 (overlapping) to the RCA.  Procedure was complicated by wire perforation of PLV, PLV was coil embolized.  Myoview 2009 showed no ischemia or infarction.  NSTEMI 1/13.  LHC with 80% mid LAD (same as 2007), 99% mid CFX, 70% distal RCA.  PROMUS DES to CFX.   2. Hyperlipidemia: Unable to tolerate statins (Crestor, Lipitor) due to leg weakness and liver disease.   3. Autoimmune hepatitis: Followed at [facility].  On prednisone and sirolimus.   4. Lumbar disc disease/sciatica.  5. CKD 6. Chronic venous insufficiency with chronic R>L lower leg swelling.  7. Paroxysmal atrial fibrillation: Only documented episode was in 7/07.  8. SVT: suppressed by beta blocker.  9. Ischemic cardiomyopathy: Echo (1/13) with EF 40-45%.  EF 40% by LV-gram (1/13).   SH: Lives in [place], married, retired Scientist, research (physical sciences).  Never smoked.    FH: Noncontributory.   ROS: All systems reviewed and negative except as per HPI.   Current Outpatient Prescriptions  Medication Sig Dispense Refill  . Ascorbic Acid (VITAMIN C) 1000 MG tablet Take 1,000 mg by mouth 2 (two)  times daily.       Marland Kitchen aspirin 81 MG tablet Take 81 mg by mouth every other day.       . bisoprolol (ZEBETA) 5 MG tablet Take 5 mg by mouth daily.        . calcitonin, salmon, (MIACALCIN/FORTICAL) 200 UNIT/ACT nasal spray Take 1 spray by mouth every other day. Alternating nostrils      . calcium carbonate 200 MG capsule Take 1,000 mg by mouth 2 (two) times daily with a meal.       . Cholecalciferol (VITAMIN D PO) Take 5,000 Units by mouth daily.        . Chromium 500 MCG TABS Take 1 tablet by mouth daily.        . Coenzyme Q-10 100 MG capsule Take 2 capsules (200 mg total) by mouth daily.  60 capsule  6  . ezetimibe (ZETIA) 10 MG tablet Take 10 mg by mouth daily.        . fish oil-omega-3 fatty acids 1000 MG capsule Take 2 g by mouth 2 (two) times daily.       . insulin lispro (HUMALOG) 100 UNIT/ML injection Inject 7-10 Units into the skin 3 (three) times daily before meals.       Marland Kitchen L-Methylfolate-B6-B12 (METANX) 3-35-2 MG TABS Take 1 tablet by mouth 2 (two) times daily.        Marland Kitchen  methadone (DOLOPHINE) 10 MG tablet Take 10 mg by mouth 2 (two) times daily.        . Multiple Vitamin (MULTIVITAMIN) capsule Take 1 capsule by mouth daily.        . nitroGLYCERIN (NITROSTAT) 0.4 MG SL tablet Place 1 tablet (0.4 mg total) under the tongue every 5 (five) minutes as needed for chest pain.  25 tablet  3  . predniSONE (DELTASONE) 10 MG tablet Take 10 mg by mouth daily.        . Selenium 200 MCG CAPS Take 1 capsule by mouth daily.        . sirolimus (RAPAMUNE) 1 MG tablet Take 1 mg by mouth every other day.        . clopidogrel (PLAVIX) 75 MG tablet Take 1 tablet (75 mg total) by mouth daily.  90 tablet  3  . losartan (COZAAR) 25 MG tablet 1/2 tablet daily  30 tablet  6  . pravastatin (PRAVACHOL) 40 MG tablet Take 1 tablet (40 mg total) by mouth every evening.  90 tablet  3   BP 130/82  Pulse 56  Ht 5' 3.5" (1.613 m)  Wt 60.056 kg (132 lb 6.4 oz)  BMI 23.09 kg/m2 General: NAD Neck: No JVD, no thyromegaly  or thyroid nodule.  Lungs: Clear to auscultation bilaterally with normal respiratory effort. CV: Nondisplaced PMI.  Heart regular S1/S2, no S3/S4, no murmur.  1+ ankle edema, right > left.  Soft left carotid bruit.  Normal pedal pulses.  Abdomen: Soft, nontender, no hepatosplenomegaly, no distention.  Skin: Erythema bilateral lower legs, chronic. Possible venous stasis dermatitis.   Neurologic: Alert and oriented x 3.  Psych: Normal affect. Extremities: No clubbing or cyanosis.

## 2011-09-20 ENCOUNTER — Other Ambulatory Visit (INDEPENDENT_AMBULATORY_CARE_PROVIDER_SITE_OTHER): Payer: Medicare Other | Admitting: *Deleted

## 2011-09-20 ENCOUNTER — Ambulatory Visit (HOSPITAL_COMMUNITY): Payer: Medicare Other | Attending: Cardiology | Admitting: Radiology

## 2011-09-20 DIAGNOSIS — I779 Disorder of arteries and arterioles, unspecified: Secondary | ICD-10-CM | POA: Insufficient documentation

## 2011-09-20 DIAGNOSIS — I5022 Chronic systolic (congestive) heart failure: Secondary | ICD-10-CM

## 2011-09-20 DIAGNOSIS — I2510 Atherosclerotic heart disease of native coronary artery without angina pectoris: Secondary | ICD-10-CM | POA: Insufficient documentation

## 2011-09-20 DIAGNOSIS — I509 Heart failure, unspecified: Secondary | ICD-10-CM | POA: Insufficient documentation

## 2011-09-20 DIAGNOSIS — R002 Palpitations: Secondary | ICD-10-CM | POA: Insufficient documentation

## 2011-09-20 DIAGNOSIS — E785 Hyperlipidemia, unspecified: Secondary | ICD-10-CM | POA: Insufficient documentation

## 2011-09-20 DIAGNOSIS — R079 Chest pain, unspecified: Secondary | ICD-10-CM

## 2011-09-20 DIAGNOSIS — I10 Essential (primary) hypertension: Secondary | ICD-10-CM | POA: Insufficient documentation

## 2011-09-20 DIAGNOSIS — E119 Type 2 diabetes mellitus without complications: Secondary | ICD-10-CM | POA: Insufficient documentation

## 2011-09-20 DIAGNOSIS — Z794 Long term (current) use of insulin: Secondary | ICD-10-CM | POA: Insufficient documentation

## 2011-09-20 DIAGNOSIS — Z8249 Family history of ischemic heart disease and other diseases of the circulatory system: Secondary | ICD-10-CM | POA: Insufficient documentation

## 2011-09-20 LAB — BASIC METABOLIC PANEL
BUN: 27 mg/dL — ABNORMAL HIGH (ref 6–23)
Chloride: 103 mEq/L (ref 96–112)
Glucose, Bld: 198 mg/dL — ABNORMAL HIGH (ref 70–99)
Potassium: 4.5 mEq/L (ref 3.5–5.1)

## 2011-09-20 MED ORDER — REGADENOSON 0.4 MG/5ML IV SOLN
0.4000 mg | Freq: Once | INTRAVENOUS | Status: AC
  Administered 2011-09-20: 0.4 mg via INTRAVENOUS

## 2011-09-20 MED ORDER — TECHNETIUM TC 99M TETROFOSMIN IV KIT
11.0000 | PACK | Freq: Once | INTRAVENOUS | Status: AC | PRN
  Administered 2011-09-20: 11 via INTRAVENOUS

## 2011-09-20 MED ORDER — TECHNETIUM TC 99M TETROFOSMIN IV KIT
33.0000 | PACK | Freq: Once | INTRAVENOUS | Status: AC | PRN
  Administered 2011-09-20: 33 via INTRAVENOUS

## 2011-09-20 NOTE — Progress Notes (Addendum)
[facility] SITE 3 NUCLEAR MED [address] [place] [address] [phone]  Cardiology Nuclear Med Study  Cynthia Hicks is a 78 y.o. female [id number] 08/14/1932   Nuclear Med Background Indication for Stress Test:  Evaluation for Ischemia, Stent Patency and 08/29/11 Post Hospital: NSTEMI > Stent History: 7/07 Stents:RCA, 8/07 MPS , CHF, 01/13 ECHO:EF; 40-45% , mild LVH, 01/13 MI, 01/13 Heart Cath: EF: 40%, 01/13 LCX  Cardiac Risk Factors: Carotid Disease, Family History - CAD, Hypertension, IDDM Type 2 and Lipids  Symptoms:  Chest Pain and Palpitations   Nuclear Pre-Procedure Caffeine/Decaff Intake:  None NPO After: 7:30am   Lungs:  clear IV 0.9% NS with Angio Cath:  20g  IV Site: R Antecubital  IV Started by:  Stanton Kidney, EMT-P  Chest Size (in):  36 Cup Size: C  Height: 5\' 3"  (1.6 m)  Weight:  131 lb (59.421 kg)  BMI:  Body mass index is 23.21 kg/(m^2). Tech Comments:  Bisoprolol held > 24 hours, per patient. CBG= 188 @ 10:30 am, per patient.    Nuclear Med Study 1 or 2 day study: 1 day  Stress Test Type:  Lexiscan  Reading MD: Willa Rough, MD  Order Authorizing Provider:  [facility]  Resting Radionuclide: Technetium 9m Tetrofosmin  Resting Radionuclide Dose: 11.0 mCi   Stress Radionuclide:  Technetium 15m Tetrofosmin  Stress Radionuclide Dose: 33.0 mCi           Stress Protocol Rest HR: 81 Stress HR: 101  Rest BP: 156/79 Stress BP: 174/75  Exercise Time (min): n/a METS: n/a   Predicted Max HR: 144 bpm % Max HR: 70.14 bpm Rate Pressure Product: 21308   Dose of Adenosine (mg):  n/a Dose of Lexiscan: n/a mg  Dose of Atropine (mg): n/a Dose of Dobutamine: n/a mcg/kg/min (at max HR)  Stress Test Technologist: Milana Na, EMT-P  Nuclear Technologist:  Doyne Keel, CNMT     Rest Procedure:  Myocardial perfusion imaging was performed at rest 45 minutes following the intravenous administration of Technetium 60m Tetrofosmin. Rest ECG:  NSR  Stress Procedure:  The patient received IV Lexiscan 0.4 mg over 15-seconds.  Technetium 90m Tetrofosmin injected at 30-seconds.  There were non specific changes, sob, and chest pressure with Lexiscan.  Quantitative spect images were obtained after a 45 minute delay. Stress ECG: No significant change from baseline ECG  QPS Raw Data Images:  Patient motion noted; appropriate software correction applied. Stress Images:  There is interference from nuclear activity from structures below the diaphragm. This affects the ability to see the inferior wall with stress. No marked areas of decreased activity are seen. Rest Images:  No significant abnormalities are noted. Subtraction (SDS):  No evidence of ischemia. Transient Ischemic Dilatation (Normal <1.22):  0.73 Lung/Heart Ratio (Normal <0.45):  0.32  Quantitative Gated Spect Images QGS EDV:  83 ml QGS ESV:  43 ml QGS cine images:  The data available for the wall motion assessment may not be reliable. There is question of decreased motion at the base of the inferior wall. This is probably real. However there is an unusual motion at the apex which is probably an artifact. QGS EF: 49%  Impression Exercise Capacity:  Lexisscan with no exercise BP Response:  Normal blood pressure response. Clinical Symptoms:  Shortness of breath ECG Impression:  No significant ST segment change suggestive of ischemia. Comparison with Prior Nuclear Study: No images to compare  Overall Impression:  There is interference from nuclear  activity from structures below the diaphragm. This affects the ability to fully assess the inferior wall. However I am not convinced of any significant ischemia in any distribution. There may be slight decreased motion at the base of the inferior wall. This seems similar to the prior report of 2009. The current overall wall motion assessment may not be accurate. Overall I believe there is no significant ischemia. I doubt there is any  significant change from 2009.   Jerral Bonito, MD   No finding to suggest ischemia in the LAD distribution.  [employer]

## 2011-09-24 LAB — HEPATIC FUNCTION PANEL
ALT: 35 U/L (ref 0–35)
AST: 34 U/L (ref 0–37)
Total Bilirubin: 0.5 mg/dL (ref 0.3–1.2)

## 2011-09-24 NOTE — Progress Notes (Signed)
Pt.notified

## 2011-09-24 NOTE — Progress Notes (Signed)
LMTCB

## 2011-11-09 ENCOUNTER — Encounter: Payer: Self-pay | Admitting: Cardiology

## 2011-12-03 ENCOUNTER — Ambulatory Visit (INDEPENDENT_AMBULATORY_CARE_PROVIDER_SITE_OTHER): Payer: Medicare Other | Admitting: Cardiology

## 2011-12-03 ENCOUNTER — Ambulatory Visit (HOSPITAL_COMMUNITY): Payer: Medicare Other | Attending: Cardiology

## 2011-12-03 ENCOUNTER — Encounter: Payer: Self-pay | Admitting: Cardiology

## 2011-12-03 ENCOUNTER — Other Ambulatory Visit: Payer: Self-pay

## 2011-12-03 VITALS — BP 148/72 | HR 57 | Ht 63.5 in | Wt 135.4 lb

## 2011-12-03 DIAGNOSIS — I252 Old myocardial infarction: Secondary | ICD-10-CM | POA: Insufficient documentation

## 2011-12-03 DIAGNOSIS — I509 Heart failure, unspecified: Secondary | ICD-10-CM

## 2011-12-03 DIAGNOSIS — E785 Hyperlipidemia, unspecified: Secondary | ICD-10-CM | POA: Insufficient documentation

## 2011-12-03 DIAGNOSIS — I5022 Chronic systolic (congestive) heart failure: Secondary | ICD-10-CM

## 2011-12-03 DIAGNOSIS — I498 Other specified cardiac arrhythmias: Secondary | ICD-10-CM | POA: Insufficient documentation

## 2011-12-03 DIAGNOSIS — I2510 Atherosclerotic heart disease of native coronary artery without angina pectoris: Secondary | ICD-10-CM | POA: Insufficient documentation

## 2011-12-03 DIAGNOSIS — I2589 Other forms of chronic ischemic heart disease: Secondary | ICD-10-CM | POA: Insufficient documentation

## 2011-12-03 DIAGNOSIS — N289 Disorder of kidney and ureter, unspecified: Secondary | ICD-10-CM | POA: Insufficient documentation

## 2011-12-03 DIAGNOSIS — I4891 Unspecified atrial fibrillation: Secondary | ICD-10-CM | POA: Insufficient documentation

## 2011-12-03 DIAGNOSIS — I059 Rheumatic mitral valve disease, unspecified: Secondary | ICD-10-CM | POA: Insufficient documentation

## 2011-12-03 DIAGNOSIS — R0989 Other specified symptoms and signs involving the circulatory and respiratory systems: Secondary | ICD-10-CM

## 2011-12-03 DIAGNOSIS — R109 Unspecified abdominal pain: Secondary | ICD-10-CM

## 2011-12-03 MED ORDER — LOSARTAN POTASSIUM 25 MG PO TABS: 25.0000 mg | ORAL_TABLET | Freq: Every day | ORAL | Status: DC

## 2011-12-03 NOTE — Patient Instructions (Signed)
Increase losartan to 25mg  daily. This will be one 25mg  tablet daily.  Your physician recommends that you return for lab work in: 2 weeks--BMET you have the order. Please fax the results to Dr Shirlee Latch [phone].   Dr Shirlee Latch has referred you to Dr Rhea Belton in the [facility] GI department.  Your physician recommends that you schedule a follow-up appointment in: 4 months with Dr Shirlee Latch.

## 2011-12-04 DIAGNOSIS — R109 Unspecified abdominal pain: Secondary | ICD-10-CM | POA: Insufficient documentation

## 2011-12-04 NOTE — Progress Notes (Signed)
PCP: Dr. Donnel Saxon  78 yo with history of CAD and autoimmune hepatitis presents for cardiology followup.  She was admitted in 1/13 with NSTEMI.  Left heart cath showed 80% mid LAD stenosis and 99% mid CFX stenosis (likely culprit).  EF was 40-45% by echo and LV-gram.  She had a PROMUS DES to the CFX.  No intervention to the LAD.   Lexiscan myoview done subsequently in 2/13 showed EF 49% with no ischemia.  Echo was done today, which I reviewed and shows EF around 45-50% with basal to mid inferior and mid-apical posterior akinesis along with mild MR.   She continues to have back pain from lumbar disc disease and sciatica.  The pain has significantly limited her walking, and she is now on methadone.   She walks for about 1/2 hour twice a day, adding up to about a mile.  She is using a walker because of the back pain.  No chest pain, no dyspnea.  Main complaint besides back pain has been abdominal pain.  She has had LLQ and RLQ pain for about a month.  It has been fairly constant.  She has noted some rectal bleeding (on Plavix and ASA).  She wants to see GI.      ECG: NSR, LAFB, lateral T wave inversions  Labs (4/13): LDL 65, HDL 101, K 3.7, creatinine 1.46, AST 26, ALT 38  PMH: 1. CAD: NSTEMI 1/07.  LHC showed 80-90% mid LAD stenosis and 95% mid RCA stenosis.  She had Taxus DES x 2 (overlapping) to the RCA.  Procedure was complicated by wire perforation of PLV, PLV was coil embolized.  Myoview 2009 showed no ischemia or infarction.  NSTEMI 1/13.  LHC with 80% mid LAD (same as 2007), 99% mid CFX, 70% distal RCA.  PROMUS DES to CFX.  Lexiscan myoview (2/13, to assess LAD territory): EF 49%, no ischemia.  2. Hyperlipidemia: Unable to tolerate statins (Crestor, Lipitor) due to leg weakness and liver disease.   3. Autoimmune hepatitis: Followed at [facility].  On prednisone and sirolimus.   4. Lumbar disc disease/sciatica.  5. CKD 6. Chronic venous insufficiency with chronic R>L lower leg swelling.  7.  Paroxysmal atrial fibrillation: Only documented episode was in 7/07.  8. SVT: suppressed by beta blocker.  9. Ischemic cardiomyopathy: Echo (1/13) with EF 40-45%.  EF 40% by LV-gram (1/13).  Echo (4/13): EF 45-50% with basal to mid inferior and mid to apical posterior akinesis and moderate MR.  10. Carotid dopplers (12/12) with minimal disease.   SH: Lives in [place], married, retired Scientist, research (physical sciences).  Never smoked.    FH: Noncontributory.   ROS: All systems reviewed and negative except as per HPI.   Current Outpatient Prescriptions  Medication Sig Dispense Refill  . Ascorbic Acid (VITAMIN C) 1000 MG tablet Take 1,000 mg by mouth 2 (two) times daily.       Marland Kitchen aspirin 81 MG tablet Take 81 mg by mouth every other day.       . bisoprolol (ZEBETA) 5 MG tablet Take 5 mg by mouth daily.        . calcitonin, salmon, (MIACALCIN/FORTICAL) 200 UNIT/ACT nasal spray Take 1 spray by mouth every other day. Alternating nostrils      . calcium carbonate 200 MG capsule Take 1,000 mg by mouth 2 (two) times daily with a meal.       . Cholecalciferol (VITAMIN D PO) Take 5,000 Units by mouth daily.        Marland Kitchen  Chromium 500 MCG TABS Take 1 tablet by mouth daily.        . clopidogrel (PLAVIX) 75 MG tablet Take 1 tablet (75 mg total) by mouth daily.  90 tablet  3  . Coenzyme Q-10 100 MG capsule Take 2 capsules (200 mg total) by mouth daily.  60 capsule  6  . ezetimibe (ZETIA) 10 MG tablet Take 10 mg by mouth daily.        . fish oil-omega-3 fatty acids 1000 MG capsule Take 2 g by mouth 2 (two) times daily.       . insulin lispro (HUMALOG) 100 UNIT/ML injection Inject 7-10 Units into the skin 3 (three) times daily before meals.       Marland Kitchen L-Methylfolate-B6-B12 (METANX) 3-35-2 MG TABS Take 1 tablet by mouth 2 (two) times daily.        . methadone (DOLOPHINE) 10 MG tablet Take 10 mg by mouth 2 (two) times daily.        . Multiple Vitamin (MULTIVITAMIN) capsule Take 1 capsule by mouth daily.        . nitroGLYCERIN  (NITROSTAT) 0.4 MG SL tablet Place 1 tablet (0.4 mg total) under the tongue every 5 (five) minutes as needed for chest pain.  25 tablet  3  . pravastatin (PRAVACHOL) 40 MG tablet Take 1 tablet (40 mg total) by mouth every evening.  90 tablet  3  . predniSONE (DELTASONE) 10 MG tablet Take 10 mg by mouth daily.        . Selenium 200 MCG CAPS Take 1 capsule by mouth daily.        . sirolimus (RAPAMUNE) 1 MG tablet Take 1 mg by mouth every other day.        . losartan (COZAAR) 25 MG tablet Take 1 tablet (25 mg total) by mouth daily.  90 tablet  3   BP 148/72  Pulse 57  Ht 5' 3.5" (1.613 m)  Wt 135 lb 6.4 oz (61.417 kg)  BMI 23.61 kg/m2 General: NAD Neck: No JVD, no thyromegaly or thyroid nodule.  Lungs: Clear to auscultation bilaterally with normal respiratory effort. CV: Nondisplaced PMI.  Heart regular S1/S2, no S3/S4, no murmur.  1+ edema to knee on right, 1+ edema 1/2 to knee on left.  She has chronic R>L lower extremity edema.  Soft left carotid bruit.  Normal pedal pulses.  Abdomen: Soft, nontender, no hepatosplenomegaly, no distention.  Skin: Erythema bilateral lower legs, chronic. Possible venous stasis dermatitis.   Neurologic: Alert and oriented x 3.  Psych: Normal affect. Extremities: No clubbing or cyanosis.

## 2011-12-04 NOTE — Assessment & Plan Note (Signed)
Minimal disease on carotids in 12/12.  

## 2011-12-04 NOTE — Assessment & Plan Note (Signed)
NSTEMI with recent PROMUS DES to CFX.  Has residual 80% mid LAD stenosis.  This was also seen on 2007 cath.   - Lexiscan myoview showed no ischemia in LAD territory.  - ASA, Plavix, bisoprolol.  Tolerating pravastatin 40 mg daily.  - Continue walking for exercise.  Has been scared to do cardiac rehab due to concern about triggering back pain.

## 2011-12-04 NOTE — Assessment & Plan Note (Signed)
Excellent lipids in 4/13.  LFTs ok on statin.

## 2011-12-04 NOTE — Assessment & Plan Note (Signed)
Cynthia Hicks has had abdominal pain for about 1 month.  No significant tenderness.  She reports rectal bleeding but thinks this may be due to constipation.  She does need to stay on Plavix for a year after PCI before holding it, so not a good candidate for colonoscopy at this point.  She would like to see GI for evaluation of the abdominal pain, so I will refer her over there.

## 2011-12-04 NOTE — Assessment & Plan Note (Signed)
Ischemic cardiomyopathy, EF 45-50% to my read on echo done today.  Continue bisoprolol.  Increase losartan to 25 mg daily and will get BMET to follow creatinine (baseline CKD).

## 2011-12-07 ENCOUNTER — Encounter: Payer: Self-pay | Admitting: Gastroenterology

## 2011-12-07 ENCOUNTER — Ambulatory Visit (INDEPENDENT_AMBULATORY_CARE_PROVIDER_SITE_OTHER): Payer: Medicare Other | Admitting: Gastroenterology

## 2011-12-07 VITALS — BP 120/60 | HR 60 | Ht 63.5 in | Wt 135.5 lb

## 2011-12-07 DIAGNOSIS — R1031 Right lower quadrant pain: Secondary | ICD-10-CM

## 2011-12-07 NOTE — Progress Notes (Signed)
HPI: This is a   very pleasant 78 year old woman whom I am meeting for the first time today.  She is always constipated, has to push and strain a lot.    Has tried miralax for short period of time.  Has AIH, cared [facility] at [facility]  Has been for 21 years, presented with jaundice originally. She is on sirolimous and chronic steroids (10mg  a day for many).  Dr. Iva Boop.  Has had right lower quadrant, right flank pain.  Intermittent pains.  It is a grabbing pain, will remain for 30 seconds to a few minutes. Nothing gets it better.  No associated nausea or vomiting, no radiation of the pain.  Nothing reliably brings it on.  No overt urinary bleeding.  Has seen minor blood in underpants at times.  The pain will occur 15 to 20 times.    She had right groin abscess, treated with drainage and IV abx for 6 week, drain for six weeks.  Care was through [facility] [place] [facility].  1/13 drug eluting coronary stent placed, per Dr. Ronelle Nigh office note last month she should not be holding plavix for a year from then (08/2012).  CBC, complete metabolic profile last month ago were essentially normal except for a slightly elevated AST.  Review of systems: Pertinent positive and negative review of systems were noted in the above HPI section. Complete review of systems was performed and was otherwise normal.    Past Medical History  Diagnosis Date  . Rapid heart rate 11/04/2010  . Hypertension   . Arthritis   . Diabetes mellitus   . Coronary artery disease 2007    A.  MI 01/2003;  B.  02/2006 NSTEMI - LAD 80-72m, LCX nl, RCA 83m - RCA stented w/ Taxus DES;   C.  2009 Neg MV;   D. 11/2010 Echo - EF 65%;  E. 08/2011 - nstemi, MV @ [facility] showed inflat isch and EF 37%  . Autoimmune Liver Disease     A.  Followed @ [facility] - on chronic prednisone and  sirolimus  . Hyperlipidemia     inability to stake statins due to liver disease  . Cellulitis     chronic cellulitis of LE  . Chronic kidney disease   .  Bulging disc   . Sciatica   . Chronic back pain     A.  On Methadone  . Skin cancer (melanoma)   . Esophageal stricture     narrowing    Past Surgical History  Procedure Date  . Cardiac catheterization     Normal EF  . Coronary angioplasty 2007    2 DES placement to RCA complicated by a wire perforation  . Back surgery 1992  . Tubal ligation 1982  . Elbow surgery     neuroma removed left  . Abscess drainage     Right side    Current Outpatient Prescriptions  Medication Sig Dispense Refill  . Ascorbic Acid (VITAMIN C) 1000 MG tablet Take 1,000 mg by mouth 2 (two) times daily.       Marland Kitchen aspirin 81 MG tablet Take 81 mg by mouth every other day.       . bisoprolol (ZEBETA) 5 MG tablet Take 5 mg by mouth daily.        . calcitonin, salmon, (MIACALCIN/FORTICAL) 200 UNIT/ACT nasal spray Take 1 spray by mouth every other day. Alternating nostrils      . calcium carbonate 200 MG capsule Take 1,000 mg by mouth  2 (two) times daily with a meal.       . Cholecalciferol (VITAMIN D PO) Take 5,000 Units by mouth daily.        . Chromium 500 MCG TABS Take 1 tablet by mouth daily.        . clopidogrel (PLAVIX) 75 MG tablet Take 1 tablet (75 mg total) by mouth daily.  90 tablet  3  . Coenzyme Q-10 100 MG capsule Take 2 capsules (200 mg total) by mouth daily.  60 capsule  6  . ezetimibe (ZETIA) 10 MG tablet Take 10 mg by mouth daily.        . fish oil-omega-3 fatty acids 1000 MG capsule Take 2 g by mouth 2 (two) times daily.       . insulin lispro (HUMALOG) 100 UNIT/ML injection Inject 7-10 Units into the skin 3 (three) times daily before meals.       Marland Kitchen L-Methylfolate-B6-B12 (METANX) 3-35-2 MG TABS Take 1 tablet by mouth 2 (two) times daily.        Marland Kitchen losartan (COZAAR) 25 MG tablet Take 1 tablet (25 mg total) by mouth daily.  90 tablet  3  . MAGNESIUM PO Take 125 mg by mouth daily.      . methadone (DOLOPHINE) 10 MG tablet Take 10 mg by mouth 2 (two) times daily.        . Multiple Vitamin  (MULTIVITAMIN) capsule Take 1 capsule by mouth daily.        . nitroGLYCERIN (NITROSTAT) 0.4 MG SL tablet Place 1 tablet (0.4 mg total) under the tongue every 5 (five) minutes as needed for chest pain.  25 tablet  3  . pravastatin (PRAVACHOL) 40 MG tablet Take 1 tablet (40 mg total) by mouth every evening.  90 tablet  3  . predniSONE (DELTASONE) 10 MG tablet Take 10 mg by mouth daily.        . Selenium 200 MCG CAPS Take 1 capsule by mouth daily.        . sirolimus (RAPAMUNE) 1 MG tablet Take 1 mg by mouth every other day.          Allergies as of 12/07/2011 - Review Complete 12/07/2011  Allergen Reaction Noted  . Cephalosporins      Family History  Problem Relation Age of Onset  . Cancer Mother 30    lung and pancreatic  . Heart failure Father 19    (m.i.)  . Heart disease Maternal Grandfather     History   Social History  . Marital Status: Married    Spouse Name: John    Number of Children: 2  . Years of Education: N/A   Occupational History  . real Therapist, occupational     retired   Social History Main Topics  . Smoking status: Never Smoker   . Smokeless tobacco: Never Used  . Alcohol Use: No  . Drug Use: No  . Sexually Active: Not on file   Other Topics Concern  . Not on file   Social History Narrative   Pt lives in [place] with husband.  She is retired.  She is not very active @ home.       Physical Exam: Ht 5' 3.5" (1.613 m)  Wt 135 lb 8 oz (61.462 kg)  BMI 23.63 kg/m2 Constitutional: generally well-appearing Psychiatric: alert and oriented x3 Eyes: extraocular movements intact Mouth: oral pharynx moist, no lesions Neck: supple no lymphadenopathy Cardiovascular: heart regular rate and rhythm Lungs: clear to auscultation bilaterally  Abdomen: soft, mildly tender right lower quadrant nondistended, no obvious ascites, no peritoneal signs, normal bowel sounds Extremities: no lower extremity edema bilaterally Skin: no lesions on visible  extremities    Assessment and plan: 78 y.o. female with  right lower quadrant abdominal pain, mild right lower quadrant tenderness, history of right groin abscess 2-3 years ago; minor rectal bleeding  We will get records sent over regarding her right groin abscess that was diagnosed and treated 2-3 years ago. Her symptoms today could be related to scar tissue from the abscess, could be a new process. I would like to image her with MRI of the abdomen and pelvis, she told me this was the only way that the abscess was finally diagnosed 2-3 years ago. CT scans were not helpful apparently. She also has mild intermittent rectal bleeding and I recommended a colonoscopy however she is not interested in that at this point. I explained that it could be done safely while she remains on Plavix knowing that we could not remove any big polyps but for diagnostic purposes it would be safe. She is still not interested.

## 2011-12-07 NOTE — Patient Instructions (Addendum)
We will get records from Dr. Harvin Hazel in [place], regarding your right groin abscess. MRI abdomen, pelvis (open MRI for claustrophobia) for RLQ pain and history of right sided groin abscess.  You have been scheduled at [facility] at [address] On 12/14/11 at 2:45 pm arrival time.  Please have nothing to eat or drink after 10:45 am.  There phone number is [phone]. Given your rectal bleeding you will need a colonoscopy, please reconsider.  We could do this while you stay on plavix.

## 2011-12-11 ENCOUNTER — Telehealth: Payer: Self-pay | Admitting: Gastroenterology

## 2011-12-11 NOTE — Telephone Encounter (Signed)
The pt was concerned that the MRI is not open but I did call and confirm that the MRI is open.  I did advise the pt and she is going to try and have the test done as scheduled.  I have also asked that she try and contact Dr Harvin Hazel regarding her past CT and MRI,  Her current insurance will not approve until we receive those records.  I have sent release and called the office myself.  The pt agreed to call and try to have those sent today

## 2011-12-11 NOTE — Telephone Encounter (Signed)
I received a call back from the pt's husband and he says that the pt can not have the MRI because the machine is not open enough and wants to have her put to sleep for the exam.  I asked about the pt having valium before the test in the past and if that may help but he insist that she can not have it done unless sedated completely.  Please advise

## 2011-12-13 NOTE — Telephone Encounter (Signed)
The pt's husband called and advised me that he has taken his wife to [facility] and the MRI has been cx.  He will call back after she has been evaluated.

## 2011-12-13 NOTE — Telephone Encounter (Signed)
Ok, that is where she had abscess treated 1-2 years ago.  Thanks

## 2011-12-13 NOTE — Telephone Encounter (Signed)
Left message on machine to call back  

## 2011-12-13 NOTE — Telephone Encounter (Signed)
Please let them know we cannot 'sedate someone completely' for an xray test.  Let's change the order to CT scan abd and pelvis instead.  It is a MUCH quicker exam and much less claustrophobic.

## 2011-12-14 ENCOUNTER — Other Ambulatory Visit: Payer: PRIVATE HEALTH INSURANCE

## 2011-12-24 ENCOUNTER — Telehealth: Payer: Self-pay | Admitting: Cardiology

## 2011-12-24 NOTE — Telephone Encounter (Signed)
New msg Pt needs an order for blood work faxed to [phone] in [place].

## 2011-12-24 NOTE — Telephone Encounter (Signed)
I spoke with the patient. She was supposed to go last week for labs, but has been sick. She would like to go tomorrow. Order faxed to [facility] at [phone] for a bmet to be done.

## 2011-12-26 ENCOUNTER — Encounter: Payer: Self-pay | Admitting: Cardiology

## 2012-01-11 ENCOUNTER — Other Ambulatory Visit: Payer: Self-pay | Admitting: Cardiology

## 2012-01-11 MED ORDER — BISOPROLOL FUMARATE 5 MG PO TABS: 5.0000 mg | ORAL_TABLET | Freq: Every day | ORAL | Status: DC

## 2012-01-11 NOTE — Telephone Encounter (Signed)
Has only one pill left

## 2012-03-03 ENCOUNTER — Telehealth: Payer: Self-pay | Admitting: Cardiology

## 2012-03-03 DIAGNOSIS — R935 Abnormal findings on diagnostic imaging of other abdominal regions, including retroperitoneum: Secondary | ICD-10-CM | POA: Insufficient documentation

## 2012-03-03 NOTE — Telephone Encounter (Signed)
New Problem:    Would like to know when they would be able to stop her Plavix for a sphincterotomy for her ERCP.  Ideally they would like to stop 7 days prior and 5 days post procedure. Please call back.

## 2012-03-03 NOTE — Telephone Encounter (Signed)
They would also like to know what type of stent she had placed.

## 2012-03-03 NOTE — Telephone Encounter (Signed)
Will forward to [facility] and Dr. Shirlee Latch.

## 2012-03-13 NOTE — Telephone Encounter (Signed)
Dr Shirlee Latch spoke with [name], [place] for Dr Ardelle Park and pt's husband.

## 2012-03-13 NOTE — Telephone Encounter (Signed)
Needs ERCP to rule out cancer.  She has a Promus DES from 1/13.  Ideally, should be on Plavix for a year prior to holding it.  However, with Promus, 6 months is probably adequate.  Given the urgency of the ERCP, I told her that she could hold Plavix for 5 days prior to ERCP then resume afterwards.

## 2012-03-13 NOTE — Telephone Encounter (Signed)
She had PCI with DES in 1/13.  Unless very urgent, needs to stay on Plavix until 1/14.

## 2012-03-13 NOTE — Telephone Encounter (Signed)
F/U  Patient husband calling please contact [facility] Dr. Bing Neighbors office [phone].  Upcoming surgery. Patient on plavix please advise.

## 2012-03-13 NOTE — Telephone Encounter (Signed)
Pt's husband is aware of Dr Alford Highland recommendations.

## 2012-03-13 NOTE — Telephone Encounter (Signed)
Dr Donia Ast she hold Plavix 7 days before and 5 days after sphinctertomy for ERCP?

## 2012-03-31 ENCOUNTER — Encounter: Payer: Self-pay | Admitting: Cardiology

## 2012-03-31 ENCOUNTER — Ambulatory Visit (INDEPENDENT_AMBULATORY_CARE_PROVIDER_SITE_OTHER): Payer: Medicare Other | Admitting: Cardiology

## 2012-03-31 VITALS — BP 141/62 | HR 76 | Ht 63.0 in | Wt 141.0 lb

## 2012-03-31 DIAGNOSIS — R0989 Other specified symptoms and signs involving the circulatory and respiratory systems: Secondary | ICD-10-CM

## 2012-03-31 DIAGNOSIS — I5022 Chronic systolic (congestive) heart failure: Secondary | ICD-10-CM

## 2012-03-31 DIAGNOSIS — N189 Chronic kidney disease, unspecified: Secondary | ICD-10-CM

## 2012-03-31 DIAGNOSIS — I2510 Atherosclerotic heart disease of native coronary artery without angina pectoris: Secondary | ICD-10-CM

## 2012-03-31 DIAGNOSIS — E785 Hyperlipidemia, unspecified: Secondary | ICD-10-CM

## 2012-03-31 DIAGNOSIS — I509 Heart failure, unspecified: Secondary | ICD-10-CM

## 2012-03-31 NOTE — Assessment & Plan Note (Signed)
NSTEMI with Promus DES to CFX in 1/13.  Has residual 80% mid LAD stenosis.  This was also seen on 2007 cath, and [facility] after PCI showed no ischemia in LAD territory.  - Mrs Elmore has been on Plavix for > 6 months with Promus DES.  Stent thrombosis risk is lower with the second generation stents and 6 months of Plavix likely gets her through the most risky period with this particular stent.  She needs an ERCP relatively urgently to rule out a tumor in the area of her common bile duct.  I will let her stop Plavix x 5 days prior to ERCP.  She should restart Plavix after procedure.  I would like her to continue ASA 81 mg daily through the procedure if at all possible.  - Continue ARB and bisoprolol.  She has tolerated pravastatin.  - Continue walking for exercise.  Has been scared to do cardiac rehab due to concern about triggering back pain.

## 2012-03-31 NOTE — Assessment & Plan Note (Signed)
LDL at goal (< 70) when last checked.  Repeat lipids/LFTs in 10/13.

## 2012-03-31 NOTE — Patient Instructions (Addendum)
Use thigh high compression stockings during the day and take them off at night. You have the prescription.  Your physician recommends that you return for a FASTING lipid profile /liver profile/BMET in October 2013.  Your physician wants you to follow-up in: 6 months with Dr Shirlee Latch. (February 2014). You will receive a reminder letter in the mail two months in advance. If you don't receive a letter, please call our office to schedule the follow-up appointment.

## 2012-03-31 NOTE — Assessment & Plan Note (Signed)
Ischemic cardiomyopathy, EF 45-50% on last echo.  Continue bisoprolol and losartan at current doses.  No significant dyspnea though she is limited in her activity level by back pain.  She has chronic lower extremity edema but no JVD.  I think that chronic venous insufficiency plays a role here.  I will have her wear thigh high compression stockings during the day.  I will not increase her Lasix.

## 2012-03-31 NOTE — Assessment & Plan Note (Signed)
Minimal disease on carotids in 12/12.

## 2012-03-31 NOTE — Assessment & Plan Note (Signed)
Stable CKD, last creatinine was 1.54.

## 2012-03-31 NOTE — Progress Notes (Signed)
Patient ID: Cynthia Hicks, female   DOB: Sep 17, 1932, 78 y.o.   MRN: [id number] PCP: Dr. Donnel Saxon  78 yo with history of CAD and autoimmune hepatitis presents for cardiology followup.  She was admitted in 1/13 with NSTEMI.  Left heart cath showed 80% mid LAD stenosis and 99% mid CFX stenosis (likely culprit).  EF was 40-45% by echo and LV-gram.  She had a PROMUS DES to the CFX.  No intervention to the LAD.   Lexiscan myoview done subsequently in 2/13 showed EF 49% with no ischemia.  Echo 4/13 showed EF around 45-50% with basal to mid inferior and mid-apical posterior akinesis along with mild MR.   She continues to have back pain from lumbar disc disease and sciatica.  The pain has significantly limited her walking, and she is now on methadone.   She walks for about 1/2 hour twice a day, adding up to about a mile.  She is using a walker or a cane because of the back pain.  No chest pain, no dyspnea.  She has been evaluated by GI at [facility] and was noted on imaging to have a mass versus stone in her common bile duct.  She needs an ERCP.  To do the ERCP, she has to come off Plavix.   Labs (4/13): LDL 65, HDL 101, K 3.7, creatinine 1.46, AST 26, ALT 38 Labs (5/13): K 4.4, creatinine 1.54  PMH: 1. CAD: NSTEMI 1/07.  LHC showed 80-90% mid LAD stenosis and 95% mid RCA stenosis.  She had Taxus DES x 2 (overlapping) to the RCA.  Procedure was complicated by wire perforation of PLV, PLV was coil embolized.  Myoview 2009 showed no ischemia or infarction.  NSTEMI 1/13.  LHC with 80% mid LAD (same as 2007), 99% mid CFX, 70% distal RCA.  PROMUS DES to CFX.  Lexiscan myoview (2/13, to assess LAD territory): EF 49%, no ischemia.  2. Hyperlipidemia: Unable to take Crestor or Lipitor due to leg weakness.  3. Autoimmune hepatitis: Followed at [facility].  On prednisone and sirolimus.   4. Lumbar disc disease/sciatica.  5. CKD 6. Chronic venous insufficiency with chronic R>L lower leg swelling.  7. Paroxysmal atrial  fibrillation: Only documented episode was in 7/07.  8. SVT: suppressed by beta blocker.  9. Ischemic cardiomyopathy: Echo (1/13) with EF 40-45%.  EF 40% by LV-gram (1/13).  Echo (4/13): EF 45-50% with basal to mid inferior and mid to apical posterior akinesis and moderate MR.  10. Carotid dopplers (12/12) with minimal disease.   SH: Lives in [place], married, retired Scientist, research (physical sciences).  Never smoked.    FH: Noncontributory.   ROS: All systems reviewed and negative except as per HPI.   Current Outpatient Prescriptions  Medication Sig Dispense Refill  . Ascorbic Acid (VITAMIN C) 1000 MG tablet Take 1,000 mg by mouth 2 (two) times daily.       Marland Kitchen aspirin 81 MG tablet Take 81 mg by mouth every other day.       . bisoprolol (ZEBETA) 5 MG tablet Take 1 tablet (5 mg total) by mouth daily.  90 tablet  3  . calcitonin, salmon, (MIACALCIN/FORTICAL) 200 UNIT/ACT nasal spray Take 1 spray by mouth every other day. Alternating nostrils      . calcium carbonate 200 MG capsule Take 1,000 mg by mouth 2 (two) times daily with a meal.       . Cholecalciferol (VITAMIN D PO) Take 5,000 Units by mouth daily.        Marland Kitchen  Chromium 500 MCG TABS Take 1 tablet by mouth daily.        . clopidogrel (PLAVIX) 75 MG tablet Take 1 tablet (75 mg total) by mouth daily.  90 tablet  3  . Coenzyme Q-10 100 MG capsule Take 2 capsules (200 mg total) by mouth daily.  60 capsule  6  . ezetimibe (ZETIA) 10 MG tablet Take 10 mg by mouth daily.        . fish oil-omega-3 fatty acids 1000 MG capsule Take 1 g by mouth 2 (two) times daily.       . insulin lispro (HUMALOG) 100 UNIT/ML injection Inject 12-15 Units into the skin 3 (three) times daily before meals. 12 units bid and 15 units once daily      . L-Methylfolate-B6-B12 (METANX) 3-35-2 MG TABS Take 1 tablet by mouth 2 (two) times daily.        Marland Kitchen losartan (COZAAR) 25 MG tablet Take 1 tablet (25 mg total) by mouth daily.  90 tablet  3  . MAGNESIUM PO Take 125 mg by mouth daily.      .  methadone (DOLOPHINE) 10 MG tablet Take 10 mg by mouth 2 (two) times daily.        . Multiple Vitamin (MULTIVITAMIN) capsule Take 1 capsule by mouth daily.        . nitroGLYCERIN (NITROSTAT) 0.4 MG SL tablet Place 1 tablet (0.4 mg total) under the tongue every 5 (five) minutes as needed for chest pain.  25 tablet  3  . pravastatin (PRAVACHOL) 40 MG tablet Take 1 tablet (40 mg total) by mouth every evening.  90 tablet  3  . predniSONE (DELTASONE) 10 MG tablet Take 10 mg by mouth daily.        . Selenium 200 MCG CAPS Take 1 capsule by mouth daily.        . sirolimus (RAPAMUNE) 1 MG tablet Take 1 mg by mouth every other day.         BP 141/62  Pulse 76  Ht 5\' 3"  (1.6 m)  Wt 141 lb (63.957 kg)  BMI 24.98 kg/m2 General: NAD Neck: No JVD, no thyromegaly or thyroid nodule.  Lungs: Clear to auscultation bilaterally with normal respiratory effort. CV: Nondisplaced PMI.  Heart regular S1/S2, no S3/S4, no murmur.  1+ edema to knee on right, 1+ edema 1/2 to knee on left.  She has chronic R>L lower extremity edema.  Soft left carotid bruit.  Normal pedal pulses.  Abdomen: Soft, nontender, no hepatosplenomegaly, no distention.  Skin: Erythema bilateral lower legs, chronic. Possible venous stasis dermatitis.   Neurologic: Alert and oriented x 3.  Psych: Normal affect. Extremities: No clubbing or cyanosis.

## 2012-04-04 DIAGNOSIS — E785 Hyperlipidemia, unspecified: Secondary | ICD-10-CM | POA: Insufficient documentation

## 2012-04-04 DIAGNOSIS — K769 Liver disease, unspecified: Secondary | ICD-10-CM | POA: Insufficient documentation

## 2012-04-04 DIAGNOSIS — I2510 Atherosclerotic heart disease of native coronary artery without angina pectoris: Secondary | ICD-10-CM | POA: Insufficient documentation

## 2012-04-04 DIAGNOSIS — I219 Acute myocardial infarction, unspecified: Secondary | ICD-10-CM | POA: Insufficient documentation

## 2012-04-04 DIAGNOSIS — IMO0002 Reserved for concepts with insufficient information to code with codable children: Secondary | ICD-10-CM | POA: Insufficient documentation

## 2012-04-04 DIAGNOSIS — Z7952 Long term (current) use of systemic steroids: Secondary | ICD-10-CM | POA: Insufficient documentation

## 2012-04-04 DIAGNOSIS — E119 Type 2 diabetes mellitus without complications: Secondary | ICD-10-CM | POA: Insufficient documentation

## 2012-05-19 ENCOUNTER — Other Ambulatory Visit: Payer: PRIVATE HEALTH INSURANCE

## 2012-05-27 ENCOUNTER — Other Ambulatory Visit (INDEPENDENT_AMBULATORY_CARE_PROVIDER_SITE_OTHER): Payer: Medicare Other

## 2012-05-27 DIAGNOSIS — I2510 Atherosclerotic heart disease of native coronary artery without angina pectoris: Secondary | ICD-10-CM

## 2012-05-27 LAB — BASIC METABOLIC PANEL
CO2: 32 mEq/L (ref 19–32)
Calcium: 9.1 mg/dL (ref 8.4–10.5)
Creatinine, Ser: 1.9 mg/dL — ABNORMAL HIGH (ref 0.4–1.2)
Glucose, Bld: 72 mg/dL (ref 70–99)

## 2012-05-27 LAB — LIPID PANEL
HDL: 87.1 mg/dL (ref >39.00)
Total CHOL/HDL Ratio: 2

## 2012-05-27 LAB — LDL CHOLESTEROL, DIRECT: Direct LDL: 99.8 mg/dL

## 2012-05-27 LAB — HEPATIC FUNCTION PANEL
ALT: 27 U/L (ref 0–35)
AST: 22 U/L (ref 0–37)
Albumin: 3.3 g/dL — ABNORMAL LOW (ref 3.5–5.2)
Total Protein: 6.7 g/dL (ref 6.0–8.3)

## 2012-05-28 ENCOUNTER — Other Ambulatory Visit: Payer: Self-pay | Admitting: *Deleted

## 2012-05-28 DIAGNOSIS — E785 Hyperlipidemia, unspecified: Secondary | ICD-10-CM

## 2012-05-28 MED ORDER — PRAVASTATIN SODIUM 80 MG PO TABS: 80.0000 mg | ORAL_TABLET | Freq: Every evening | ORAL | Status: DC

## 2012-07-10 DIAGNOSIS — L0390 Cellulitis, unspecified: Secondary | ICD-10-CM | POA: Insufficient documentation

## 2012-07-18 ENCOUNTER — Encounter: Payer: Self-pay | Admitting: Cardiology

## 2012-07-21 ENCOUNTER — Telehealth: Payer: Self-pay | Admitting: Cardiology

## 2012-07-21 NOTE — Telephone Encounter (Signed)
Pt given appt with Lawson Fiscal 07/25/12. Pt unable to schedule sooner appt

## 2012-07-21 NOTE — Telephone Encounter (Signed)
I need to see her in the office or be seen by Scott/Lori to examine her and see what has been going on. (needs appt soon)

## 2012-07-21 NOTE — Telephone Encounter (Signed)
Spoke with pt. Pt recently hospitalized at [facility] for 4-5 days  for a bacterial infection--she thinks cellulitis. Pt states losartan  was stopped during that hospitalization due to low BP and because her kidney function was abnormal. She saw the PA at her PCP 07/17/12 -her BP was 124/62. She also was advised to take lasix 20mg  daily for 5 days due to increased weight. Today is the last day to take lasix. Pt had labs done 07/18/12 at PCP that are scanned in to Epic. Pt is asking for Dr Alford Highland recommendation for losartan and lasix. I will forward to Dr Shirlee Latch for review.

## 2012-07-21 NOTE — Telephone Encounter (Signed)
New problem:   Was acute pain went to er in [place] transferred to [facility] for acute viral bacteria infection.  Was taken off lostarn . Did meet with PCP on last Thursday  Lab was drawn . A copy of lab will be faxing over. As well as copy from [facility].  Please advise on lab & medication

## 2012-07-22 ENCOUNTER — Other Ambulatory Visit: Payer: Medicare Other

## 2012-07-25 ENCOUNTER — Encounter: Payer: Self-pay | Admitting: Nurse Practitioner

## 2012-07-25 ENCOUNTER — Ambulatory Visit (INDEPENDENT_AMBULATORY_CARE_PROVIDER_SITE_OTHER): Payer: Medicare Other | Admitting: Nurse Practitioner

## 2012-07-25 VITALS — BP 120/62 | HR 64 | Ht 63.5 in | Wt 135.0 lb

## 2012-07-25 DIAGNOSIS — I499 Cardiac arrhythmia, unspecified: Secondary | ICD-10-CM

## 2012-07-25 DIAGNOSIS — N189 Chronic kidney disease, unspecified: Secondary | ICD-10-CM

## 2012-07-25 DIAGNOSIS — I4891 Unspecified atrial fibrillation: Secondary | ICD-10-CM

## 2012-07-25 LAB — BASIC METABOLIC PANEL
BUN: 37 mg/dL — ABNORMAL HIGH (ref 6–23)
CO2: 30 mEq/L (ref 19–32)
Calcium: 10.1 mg/dL (ref 8.4–10.5)
Chloride: 100 mEq/L (ref 96–112)
Creatinine, Ser: 2 mg/dL — ABNORMAL HIGH (ref 0.4–1.2)
GFR: 25.98 mL/min — ABNORMAL LOW (ref >60.00)
Glucose, Bld: 274 mg/dL — ABNORMAL HIGH (ref 70–99)
Potassium: 4.9 mEq/L (ref 3.5–5.1)
Sodium: 137 mEq/L (ref 135–145)

## 2012-07-25 LAB — CBC WITH DIFFERENTIAL/PLATELET
Basophils Absolute: 0 10*3/uL (ref 0.0–0.1)
Basophils Relative: 0.5 % (ref 0.0–3.0)
Eosinophils Absolute: 0 10*3/uL (ref 0.0–0.7)
Eosinophils Relative: 0.3 % (ref 0.0–5.0)
HCT: 32.5 % — ABNORMAL LOW (ref 36.0–46.0)
Hemoglobin: 10.8 g/dL — ABNORMAL LOW (ref 12.0–15.0)
Lymphocytes Relative: 14.9 % (ref 12.0–46.0)
Lymphs Abs: 1.1 10*3/uL (ref 0.7–4.0)
MCHC: 33.1 g/dL (ref 30.0–36.0)
MCV: 96.3 fl (ref 78.0–100.0)
Monocytes Absolute: 0.5 10*3/uL (ref 0.1–1.0)
Monocytes Relative: 7.3 % (ref 3.0–12.0)
Neutro Abs: 5.5 10*3/uL (ref 1.4–7.7)
Neutrophils Relative %: 77 % (ref 43.0–77.0)
Platelets: 210 10*3/uL (ref 150.0–400.0)
RBC: 3.38 Mil/uL — ABNORMAL LOW (ref 3.87–5.11)
RDW: 17.1 % — ABNORMAL HIGH (ref 11.5–14.6)
WBC: 7.2 10*3/uL (ref 4.5–10.5)

## 2012-07-25 LAB — TSH: TSH: 0.74 u[IU]/mL (ref 0.35–5.50)

## 2012-07-25 MED ORDER — BISOPROLOL FUMARATE 5 MG PO TABS: 5.0000 mg | ORAL_TABLET | Freq: Every day | ORAL | Status: DC

## 2012-07-25 MED ORDER — WARFARIN SODIUM 5 MG PO TABS: ORAL_TABLET | ORAL | Status: DC

## 2012-07-25 NOTE — Patient Instructions (Addendum)
I would like to check labs today to follow up your kidney function  We will restart your Losartan at 25 mg a day  You are out of rhythm - atrial fibrillation  We need to stop your Plavix.  We are going to start Coumadin. I have sent a prescription to the drug store for 5 mg tablets. I only want you to take 1/2 tablet each evening. Once your level is good (between a 2 and 3) stop aspirin.  See Dr. Shirlee Latch in one month to discuss possible cardioversion.  Have your coumadin checks (protimes) faxed to [place].   Call the [facility] office at [phone] if you have any questions, problems or concerns.

## 2012-07-25 NOTE — Progress Notes (Signed)
Cynthia Hicks Date of Birth: July 22, 1933 Medical Record #[id number]  History of Present Illness: Cynthia Hicks is seen back today for a post hospital visit. She is seen for Dr. Shirlee Latch. She has multiple medical issues which are as noted below. These include known CAD and autoimmune hepatitis. She had a NSTEMI back in January treated with Promus DES to the LCX. EF 40 to 45% by echo and LV gram. Echo in April showed EF around 55% but felt to be a little lower (45 to 50% per Dr. Alford Highland personal review). She has chronic back pain, on metadone. Has chronic venous insufficiency with past cellulitis.   She was most recently at [facility] after being transferred from the ER at [place]. She had worsening edema of the right leg with significant redness and warmth. She got more lethargic. She was felt to have had a bacterial infection. Treated with clindamycin. Had some volume overload after she got home. Weight was up considerably. She took some Lasix with improvement. She was taken off her ARB due to soft BP and her renal function.   She comes in today. She is here alone. She is still a little weak. Her legs are back to their baseline. She is tired. No palpitations. No chest pain. Says her breathing is ok. She has difficulty putting on support stockings. Has finished her antibiotics. Except for the weakness, she feels back to her baseline.   Current Outpatient Prescriptions on File Prior to Visit  Medication Sig Dispense Refill  . Ascorbic Acid (VITAMIN C) 1000 MG tablet Take 1,000 mg by mouth 2 (two) times daily.       Marland Kitchen aspirin 81 MG tablet Take 81 mg by mouth every other day.       . bisoprolol (ZEBETA) 5 MG tablet Take 1 tablet (5 mg total) by mouth daily.  90 tablet  3  . calcitonin, salmon, (MIACALCIN/FORTICAL) 200 UNIT/ACT nasal spray Take 1 spray by mouth every other day. Alternating nostrils      . calcium carbonate 200 MG capsule Take 1,000 mg by mouth 2 (two) times daily with a meal.       .  Cholecalciferol (VITAMIN D PO) Take 5,000 Units by mouth daily.        . Chromium 500 MCG TABS Take 1 tablet by mouth daily.        . Coenzyme Q-10 100 MG capsule Take 2 capsules (200 mg total) by mouth daily.  60 capsule  6  . ezetimibe (ZETIA) 10 MG tablet Take 10 mg by mouth daily.        . fish oil-omega-3 fatty acids 1000 MG capsule Take 1 g by mouth 2 (two) times daily.       . insulin lispro (HUMALOG) 100 UNIT/ML injection Inject 12-15 Units into the skin 3 (three) times daily before meals. 12 units bid and 15 units once daily      . L-Methylfolate-B6-B12 (METANX) 3-35-2 MG TABS Take 1 tablet by mouth 2 (two) times daily.        Marland Kitchen MAGNESIUM PO Take 125 mg by mouth daily.      . methadone (DOLOPHINE) 10 MG tablet Take 10 mg by mouth 2 (two) times daily.        . Multiple Vitamin (MULTIVITAMIN) capsule Take 1 capsule by mouth daily.        . nitroGLYCERIN (NITROSTAT) 0.4 MG SL tablet Place 1 tablet (0.4 mg total) under the tongue every 5 (five) minutes as needed for  chest pain.  25 tablet  3  . pravastatin (PRAVACHOL) 80 MG tablet Take 1 tablet (80 mg total) by mouth every evening.  90 tablet  0  . predniSONE (DELTASONE) 10 MG tablet Take 10 mg by mouth daily.        . Selenium 200 MCG CAPS Take 1 capsule by mouth daily.        . sirolimus (RAPAMUNE) 1 MG tablet Take 1 mg by mouth every other day.        . losartan (COZAAR) 25 MG tablet Take 1 tablet (25 mg total) by mouth daily.  90 tablet  3  . warfarin (COUMADIN) 5 MG tablet Start at 1/2 tablet each evening and then as directed  90 tablet  3    Allergies  Allergen Reactions  . Cephalosporins     Blasted bone marrow    Past Medical History  Diagnosis Date  . Rapid heart rate 11/04/2010  . Hypertension   . Arthritis   . Diabetes mellitus   . Coronary artery disease 2007    A.  MI 01/2003;  B.  02/2006 NSTEMI - LAD 80-9m, LCX nl, RCA 39m - RCA stented w/ Taxus DES;   C.  2009 Neg MV;   D. 11/2010 Echo - EF 65%;  E. 08/2011 - nstemi,  MV @ [facility] showed inflat isch and EF 37%  . Autoimmune liver disease     A.  Followed @ [facility] - on chronic prednisone and  sirolimus  . Hyperlipidemia     inability to stake statins due to liver disease  . Cellulitis     chronic cellulitis of LE  . Chronic kidney disease   . Bulging disc   . Sciatica   . Chronic back pain     A.  On Methadone  . Skin cancer (melanoma)   . Esophageal stricture     narrowing    Past Surgical History  Procedure Date  . Cardiac catheterization     Normal EF  . Coronary angioplasty 2007    2 DES placement to RCA complicated by a wire perforation  . Back surgery 1992  . Tubal ligation 1982  . Elbow surgery     neuroma removed left  . Abscess drainage     Right side    History  Smoking status  . Never Smoker   Smokeless tobacco  . Never Used    History  Alcohol Use No    Family History  Problem Relation Age of Onset  . Cancer Mother 55    lung and pancreatic  . Heart failure Father 30    (m.i.)  . Heart disease Maternal Grandfather     Review of Systems: The review of systems is per the HPI.  All other systems were reviewed and are negative.  Physical Exam: BP 120/62  Pulse 64  Ht 5' 3.5" (1.613 m)  Wt 135 lb (61.236 kg)  BMI 23.54 kg/m2 Patient is very pleasant and in no acute distress. She looks chronically ill. Weight is down 6 pounds. Skin is warm and dry. Color is normal.  HEENT is unremarkable. Normocephalic/atraumatic. PERRL. Sclera are nonicteric. Neck is supple. No masses. No JVD. Lungs are clear. Cardiac exam shows an irregular rhythm. Rate is controlled. Abdomen is soft. Extremities are with 2+ edema on the right and 1+ on the left. Gait and ROM are intact. She is using a cane. No gross neurologic deficits noted.   LABORATORY DATA: EKG confirms atrial fib  with a controlled ventricular response. She has lateral ST and T wave changes. Tracing is reviewed with Dr. Shirlee Latch.   Labs from her PCP on the 13th shows her  BUN 22 with a creatinine of 1.51.   Labs for today are pending.  Lab Results  Component Value Date   WBC 10.9* 08/31/2011   HGB 10.2* 08/31/2011   HCT 31.5* 08/31/2011   PLT 149* 08/31/2011   GLUCOSE 72 05/27/2012   CHOL 202* 05/27/2012   TRIG 134.0 05/27/2012   HDL 87.10 05/27/2012   LDLDIRECT 99.8 05/27/2012   LDLCALC 192 08/04/2009   ALT 27 05/27/2012   AST 22 05/27/2012   NA 142 05/27/2012   K 3.9 05/27/2012   CL 104 05/27/2012   CREATININE 1.9* 05/27/2012   BUN 36* 05/27/2012   CO2 32 05/27/2012   INR 0.94 07/21/2009   HGBA1C  Value: 6.9 (NOTE) The ADA recommends the following therapeutic goal for glycemic control related to Hgb A1c measurement: Goal of therapy: <6.5 Hgb A1c  Reference: American Diabetes Association: Clinical Practice Recommendations 2010, Diabetes Care, 2010, 33: (Suppl  1).* 07/21/2009   MICROALBUR >600.00 mg/dL* 09/81/1914   Echo Study Conclusions from April 2013  - Left ventricle: The cavity size was normal. Wall thickness was increased in a pattern of mild LVH. Systolic function was normal. The estimated ejection fraction was in the range of 55% to 60%. There is hypokinesis of the basal-midinferior myocardium. Left ventricular diastolic function parameters were normal. - Mitral valve: Mild regurgitation.    Assessment / Plan:  1. Recent admission for bacterial cellulitis - received Clindamycin. She says her legs are back to her baseline.   2. Atrial fib - she is unaware. Looks like this is her first spell in many years. She is just on Plavix. Will need to discuss with Dr. Shirlee Latch.   3. CAD - past NSTEMI with DES to LCX in January of 2013. On Plavix. Has residual 80% mid LAD stenosis. Lexiscan after PCI showed no ischemia  In LAD territory.   4. Systolic CHF - EF of 55 to 60% on last echo in April but felt to be more like 45% per Dr. Shirlee Latch. On bisoprolol. Would like to restart her ARB.   5. Autoimmune hepatitis - followed at [facility]  6. CKD  I  have talked with Dr. Shirlee Latch.He is in agreement that we need to start anticoagulation. Her CHADs is at least a 57 (age, vascular disease, CHF). Will use coumadin in light of her renal function. She does not wish to come here for her protimes. Her husband is on coumadin and gets his checked locally. I have given her a prescription to get her coumadin checked on Monday. I suspect she will be quite sensitive to coumadin. She has just finished antibiotics. I have started 5 mg to take only 1/2 tab each evening. Check protime on Monday. She will see Dr. Shirlee Latch in a month for consideration of cardioversion. Plavix is stopped today. She may stop her aspirin when her INR is above 2.    Patient is agreeable to this plan and will call if any problems develop in the interim.

## 2012-07-28 ENCOUNTER — Telehealth: Payer: Self-pay | Admitting: *Deleted

## 2012-07-28 NOTE — Telephone Encounter (Signed)
Advised patient of lab results  Will get recheck at Dr Sebastian Ache office, sent labs to him

## 2012-07-28 NOTE — Telephone Encounter (Signed)
Message copied by Burnell Blanks on Mon Jul 28, 2012  5:32 PM ------      Message from: Rosalio Macadamia      Created: Fri Jul 25, 2012  6:23 PM       Ok to report. She needs to get a repeat BMET in a week. Her ARB was restarted. She can do this with her primary doctor if she prefers.

## 2012-07-31 ENCOUNTER — Telehealth: Payer: Self-pay | Admitting: Cardiology

## 2012-07-31 ENCOUNTER — Encounter: Payer: Self-pay | Admitting: Cardiology

## 2012-08-01 ENCOUNTER — Encounter: Payer: Self-pay | Admitting: Cardiology

## 2012-08-04 ENCOUNTER — Ambulatory Visit: Payer: Medicare Other | Admitting: Nurse Practitioner

## 2012-08-07 ENCOUNTER — Encounter: Payer: Self-pay | Admitting: Cardiology

## 2012-09-04 ENCOUNTER — Ambulatory Visit (INDEPENDENT_AMBULATORY_CARE_PROVIDER_SITE_OTHER): Payer: Medicare Other | Admitting: Cardiology

## 2012-09-04 ENCOUNTER — Encounter: Payer: Self-pay | Admitting: Cardiology

## 2012-09-04 VITALS — BP 112/66 | HR 81 | Ht 63.5 in | Wt 138.0 lb

## 2012-09-04 DIAGNOSIS — I2510 Atherosclerotic heart disease of native coronary artery without angina pectoris: Secondary | ICD-10-CM

## 2012-09-04 DIAGNOSIS — N189 Chronic kidney disease, unspecified: Secondary | ICD-10-CM

## 2012-09-04 DIAGNOSIS — I5022 Chronic systolic (congestive) heart failure: Secondary | ICD-10-CM

## 2012-09-04 DIAGNOSIS — E785 Hyperlipidemia, unspecified: Secondary | ICD-10-CM

## 2012-09-04 DIAGNOSIS — I509 Heart failure, unspecified: Secondary | ICD-10-CM

## 2012-09-04 DIAGNOSIS — I4891 Unspecified atrial fibrillation: Secondary | ICD-10-CM

## 2012-09-04 MED ORDER — FUROSEMIDE 40 MG PO TABS: 40.0000 mg | ORAL_TABLET | Freq: Every day | ORAL | Status: DC

## 2012-09-04 NOTE — Progress Notes (Signed)
Patient ID: Cynthia Hicks, female   DOB: 1933-02-11, 79 y.o.   MRN: [id number] PCP: Dr. Donnel Saxon  79 yo with history of CAD and autoimmune hepatitis presents for cardiology followup.  She was admitted in 1/13 with NSTEMI.  Left heart cath showed 80% mid LAD stenosis and 99% mid CFX stenosis (likely culprit).  EF was 40-45% by echo and LV-gram.  She had a PROMUS DES to the CFX.  No intervention to the LAD.   Lexiscan myoview done subsequently in 2/13 showed EF 49% with no ischemia.  Echo 4/13 showed EF around 45-50% with basal to mid inferior and mid-apical posterior akinesis along with mild MR.  She was admitted in 12/13 at [facility] with severe cellulitis and sepsis syndrome.  She was seen here by Norma Fredrickson later in 12/13 and found to be in atrial fibrillation.  Coumadin was started and is followed by her PCP.   She continues to have back pain from lumbar disc disease and sciatica.  The pain has significantly limited her walking, and she is now on methadone.   She walks for about 10 minutes twice a day.  She is using a walker or a cane because of the back pain.  No chest pain, no dyspnea.  She is no longer taking Lasix.  Her right leg is swollen to the thigh and her left leg to the knee.  She has had lower extremity edema chronically but this is worse than in the past.  She brings her home BP readings, SBP ranges 90s-120s at home.   ECG: atrial fibrillation, right superior axis, Qs in V1 and V2.   Labs (4/13): LDL 65, HDL 101, K 3.7, creatinine 1.46, AST 26, ALT 38 Labs (5/13): K 4.4, creatinine 1.54 Labs (10/13): LDL 100, HDL 87 Labs (1/14): K 4.3, creatinine 1.76  PMH: 1. CAD: NSTEMI 1/07.  LHC showed 80-90% mid LAD stenosis and 95% mid RCA stenosis.  She had Taxus DES x 2 (overlapping) to the RCA.  Procedure was complicated by wire perforation of PLV, PLV was coil embolized.  Myoview 2009 showed no ischemia or infarction.  NSTEMI 1/13.  LHC with 80% mid LAD (same as 2007), 99% mid CFX, 70%  distal RCA.  PROMUS DES to CFX.  Lexiscan myoview (2/13, to assess LAD territory): EF 49%, no ischemia.  2. Hyperlipidemia: Unable to take Crestor or Lipitor due to leg weakness.  3. Autoimmune hepatitis: Followed at [facility].  On prednisone and sirolimus.   4. Lumbar disc disease/sciatica.  5. CKD 6. Chronic venous insufficiency with chronic R>L lower leg swelling.  7. Paroxysmal atrial fibrillation: Only documented episode was in 7/07.  8. SVT: suppressed by beta blocker.  9. Ischemic cardiomyopathy: Echo (1/13) with EF 40-45%.  EF 40% by LV-gram (1/13).  Echo (4/13): EF 45-50% with basal to mid inferior and mid to apical posterior akinesis and moderate MR.  10. Carotid dopplers (12/12) with minimal disease.  11. Cholelithiasis/choledocholithiasis s/p ERCP and sphincterotomy in 2013.  12. Atrial fibrillation: First noted in 12/13.   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    FH: Noncontributory.   ROS: All systems reviewed and negative except as per HPI.   Current Outpatient Prescriptions  Medication Sig Dispense Refill  . Ascorbic Acid (VITAMIN C) 1000 MG tablet Take 1,000 mg by mouth 2 (two) times daily.       . bisoprolol (ZEBETA) 5 MG tablet Take 1 tablet (5 mg total) by mouth  daily.  90 tablet  3  . calcitonin, salmon, (MIACALCIN/FORTICAL) 200 UNIT/ACT nasal spray Take 1 spray by mouth every other day. Alternating nostrils      . calcium carbonate 200 MG capsule Take 1,000 mg by mouth 2 (two) times daily with a meal.       . Cholecalciferol (VITAMIN D PO) Take 5,000 Units by mouth daily.        . Chromium 500 MCG TABS Take 1 tablet by mouth daily.        Marland Kitchen doxycycline (VIBRAMYCIN) 100 MG capsule Take 1 tablet by mouth Twice daily.      Marland Kitchen ezetimibe (ZETIA) 10 MG tablet Take 10 mg by mouth daily.        . fish oil-omega-3 fatty acids 1000 MG capsule Take 1 g by mouth 2 (two) times daily.       . insulin lispro (HUMALOG) 100 UNIT/ML injection Inject  12-15 Units into the skin 3 (three) times daily before meals. 12 units bid and 15 units once daily      . L-Methylfolate-B6-B12 (METANX) 3-35-2 MG TABS Take 1 tablet by mouth 2 (two) times daily.        Marland Kitchen LANTUS SOLOSTAR 100 UNIT/ML injection 5 units nightly      . losartan (COZAAR) 25 MG tablet Take 1 tablet (25 mg total) by mouth daily.  90 tablet  3  . MAGNESIUM PO Take 125 mg by mouth daily.      . methadone (DOLOPHINE) 10 MG tablet Take 10 mg by mouth 2 (two) times daily.        . Multiple Vitamin (MULTIVITAMIN) capsule Take 1 capsule by mouth daily.        . nitroGLYCERIN (NITROSTAT) 0.4 MG SL tablet Place 1 tablet (0.4 mg total) under the tongue every 5 (five) minutes as needed for chest pain.  25 tablet  3  . pravastatin (PRAVACHOL) 80 MG tablet Take 1 tablet (80 mg total) by mouth every evening.  90 tablet  0  . predniSONE (DELTASONE) 10 MG tablet Take 10 mg by mouth daily.        . Selenium 200 MCG CAPS Take 1 capsule by mouth daily.        . sirolimus (RAPAMUNE) 1 MG tablet Take 1 mg by mouth every other day.        . warfarin (COUMADIN) 5 MG tablet Start at 1/2 tablet each evening and then as directed  90 tablet  3   BP 112/66  Pulse 81  Ht 5' 3.5" (1.613 m)  Wt 138 lb (62.596 kg)  BMI 24.06 kg/m2 General: NAD Neck: JVP 8-9 cm, no thyromegaly or thyroid nodule.  Lungs: Clear to auscultation bilaterally with normal respiratory effort. CV: Nondisplaced PMI.  Heart irregular S1/S2, no S3/S4, no murmur.  2+ edema to thigh on right, 1+ edema to knee on left.  She has chronic R>L lower extremity edema but this is worse.   Abdomen: Soft, nontender, no hepatosplenomegaly, no distention.  Skin: Erythema bilateral lower legs, chronic. Possible venous stasis dermatitis.   Neurologic: Alert and oriented x 3.  Psych: Normal affect. Extremities: No clubbing or cyanosis.   Assessment/Plan:  Chronic kidney disease  Last creatinine was 1.76.  I plan to restart Lasix today at 40 mg daily.   Will need to follow creatinine closely, will get BMET in 1 week.  Coronary artery disease  NSTEMI with Promus DES to CFX in 1/13. Has residual 80% mid LAD stenosis. This  was also seen on 2007 cath, and [facility] after PCI showed no ischemia in LAD territory.  - Mrs Kulak is on coumadin now so Plavix has been stopped . - She will continue ARB, beta blocker, and statin.  HYPERLIPIDEMIA Repeat lipids/LFTs after increase in pravastatin to 80 mg daily.  Systolic CHF, chronic Ischemic cardiomyopathy, EF 45-50% on last echo. Continue bisoprolol and losartan at current doses. No significant dyspnea though she is limited in her activity level by back pain. She is no longer taking Lasix and does appear mildly volume overloaded today (suspect though that venous insufficiency also plays a role in her lower extremity edema).  I am going to start her on lasix 40 mg daily.  As above, follow renal fxn closely with BMET and BNP to be drawn at PCP's office in 1 week.  She also should wear her graded compression stockings.   Atrial fibrillation Persistent with good rate control.  She is on warfarin and bisoprolol.  As this is her first persistent episode, I think it is reasonable to attempt cardioversion.  The atrial fibrillation may be playing a role in her current volume overload/CHF.  When she has had INR >/= 2 x 4 weeks, will plan on DCCV attempt.   Followup in office in 6 wks.   [name] 09/04/2012 2:29 PM

## 2012-09-04 NOTE — Patient Instructions (Addendum)
Your physician recommends that you schedule a follow-up appointment in: 6 WEEKS WITH DR [facility]  Your physician has recommended that you have a Cardioversion (DCCV). Electrical Cardioversion uses a jolt of electricity to your heart either through paddles or wired patches attached to your chest. This is a controlled, usually prescheduled, procedure. Defibrillation is done under light anesthesia in the hospital, and you usually go home the day of the procedure. This is done to get your heart back into a normal rhythm. You are not awake for the procedure. Please see the instruction sheet given to you today.ONCE INR ABOVE 2 FOR 4 WEEKS WILL THEN SCHEDULE  HAVE PROTIMES CHECKED WEEKLY WITH DR Brent Bulla  Your physician recommends that you return for lab work in: ONE WEEK WITH DR HOQUE= FASTING  START FUROSEMIDE 40 MG ONCE DAILY IN THE MORNING  WEAR SUPPORT STOCKINGS

## 2012-09-12 ENCOUNTER — Encounter: Payer: Self-pay | Admitting: Cardiology

## 2012-09-12 LAB — PROTIME-INR

## 2012-09-17 ENCOUNTER — Other Ambulatory Visit: Payer: Self-pay | Admitting: Cardiology

## 2012-09-17 ENCOUNTER — Other Ambulatory Visit: Payer: Self-pay | Admitting: *Deleted

## 2012-09-17 DIAGNOSIS — I872 Venous insufficiency (chronic) (peripheral): Secondary | ICD-10-CM

## 2012-09-20 ENCOUNTER — Other Ambulatory Visit: Payer: Self-pay

## 2012-09-30 ENCOUNTER — Telehealth: Payer: Self-pay | Admitting: *Deleted

## 2012-09-30 NOTE — Telephone Encounter (Signed)
Spoke with patient about INRs and scheduling DCCV. Pt states her INR was 1.6 on 09/22/12. INR  09/29/12 2.6. Pt is aware she will need 3 more weekly INRs greater that 2 to schedule DCCV. Pt will call me when her INR has been therapeutic for 4 weeks and I will schedule DCCV.

## 2012-10-17 ENCOUNTER — Ambulatory Visit: Payer: Medicare Other | Admitting: Cardiology

## 2012-10-20 ENCOUNTER — Telehealth: Payer: Self-pay | Admitting: Cardiology

## 2012-10-20 NOTE — Telephone Encounter (Signed)
New Problem:    Patient called in because her INR has been between 2-3 for the past 4 weeks.  Please call back.

## 2012-10-20 NOTE — Telephone Encounter (Signed)
Pt to have INR done 10/27/12 not 09/29/12. If INR > 2 will plan on cardioversion 10/31/12.

## 2012-10-20 NOTE — Telephone Encounter (Signed)
Spoke with pt. INR 09/29/12 2.8   10/06/12 3.0   10/13/12 2.2   10/20/12 2.8. Pt will have INR done 09/29/12.

## 2012-10-21 ENCOUNTER — Encounter: Payer: Self-pay | Admitting: Vascular Surgery

## 2012-10-22 ENCOUNTER — Ambulatory Visit (INDEPENDENT_AMBULATORY_CARE_PROVIDER_SITE_OTHER): Payer: Medicare Other | Admitting: Vascular Surgery

## 2012-10-22 ENCOUNTER — Encounter: Payer: Self-pay | Admitting: Vascular Surgery

## 2012-10-22 ENCOUNTER — Encounter (INDEPENDENT_AMBULATORY_CARE_PROVIDER_SITE_OTHER): Payer: Medicare Other | Admitting: *Deleted

## 2012-10-22 VITALS — BP 128/53 | HR 73 | Resp 14 | Ht 64.0 in | Wt 137.0 lb

## 2012-10-22 DIAGNOSIS — M7989 Other specified soft tissue disorders: Secondary | ICD-10-CM | POA: Insufficient documentation

## 2012-10-22 DIAGNOSIS — I890 Lymphedema, not elsewhere classified: Secondary | ICD-10-CM | POA: Insufficient documentation

## 2012-10-22 DIAGNOSIS — M79609 Pain in unspecified limb: Secondary | ICD-10-CM | POA: Insufficient documentation

## 2012-10-22 DIAGNOSIS — I872 Venous insufficiency (chronic) (peripheral): Secondary | ICD-10-CM

## 2012-10-22 NOTE — Assessment & Plan Note (Signed)
Based on this patient's history and physical examination, I think this patient has lymphedema bilaterally, but more significantly on the right side. Her duplex does show some mild venous insufficiency however I think a bigger issue is lymphedema. Regardless the treatment is the same. We have discussed the importance of intermittent leg elevation and the proper positioning for this. In addition I have encouraged her to continue to wear her compression stockings. She understands if she does get episodes of cellulitis that these may require prolonged antibiotics given the stagnant lymphatic fluid. I think she does have reasonable arterial perfusion based on my Doppler exam. I'll be happy to see her back at any time if any new vascular issues arise.

## 2012-10-22 NOTE — Progress Notes (Signed)
[facility] [place]  Patient name: Cynthia Hicks MRN: [id number] DOB: April 22, 1933 Sex: female  REASON FOR CONSULT: bilateral leg swelling, more significant on the right side. Referred by Dr. Ardelle Park  HPI: Cynthia Hicks is a 79 y.o. female who has a long history of bilateral lower extremity swelling. She had leg swelling on the right side for many years in approximately 2 years ago was hospitalized with an abscess in the right lower quadrant and right groin. This was treated with 6 weeks of IV and her venous antibiotics. She states that her leg swelling increased after this and she's had persistent right lower extremity swelling. She's had some left lower Shri swelling also. She has some generalized aching in her legs. I do not get any history of claudication or rest pain. She denies any history of DVT or phlebitis. She states that she has had multiple venous duplex scans which showed no evidence of DVT in the past. His had no previous radiation therapy to her groins her abdomen. She has been wearing compression stockings. Her symptoms are relieved somewhat with elevation.  Past Medical History  Diagnosis Date  . Rapid heart rate 11/04/2010  . Hypertension   . Arthritis   . Diabetes mellitus   . Coronary artery disease 2007    A.  MI 01/2003;  B.  02/2006 NSTEMI - LAD 80-59m, LCX nl, RCA 81m - RCA stented w/ Taxus DES;   C.  2009 Neg MV;   D. 11/2010 Echo - EF 65%;  E. 08/2011 - nstemi, MV @ [facility] showed inflat isch and EF 37%  . Autoimmune liver disease     A.  Followed @ [facility] - on chronic prednisone and  sirolimus  . Hyperlipidemia     inability to stake statins due to liver disease  . Cellulitis     chronic cellulitis of LE  . Chronic kidney disease   . Bulging disc   . Sciatica   . Chronic back pain     A.  On Methadone  . Skin cancer (melanoma)   . Esophageal stricture     narrowing  . Irregular heart beat   . Myocardial infarction 2007 and  Jan. 2012  .  Diabetic neuropathy   . Autoimmune liver disease   . Sepsis     Family History  Problem Relation Age of Onset  . Cancer Mother 48    lung and pancreatic  . Heart failure Father 69    (m.i.)  . Heart disease Father   . Congestive Heart Failure Father   . Heart disease Maternal Grandfather   . Hypertension Brother   . Hypertension Son     SOCIAL HISTORY: History  Substance Use Topics  . Smoking status: Never Smoker   . Smokeless tobacco: Never Used  . Alcohol Use: No    Allergies  Allergen Reactions  . Cephalosporins     Blasted bone marrow    Current Outpatient Prescriptions  Medication Sig Dispense Refill  . Ascorbic Acid (VITAMIN C) 1000 MG tablet Take 1,000 mg by mouth 2 (two) times daily.       . bisoprolol (ZEBETA) 5 MG tablet Take 1 tablet (5 mg total) by mouth daily.  90 tablet  3  . calcitonin, salmon, (MIACALCIN/FORTICAL) 200 UNIT/ACT nasal spray Take 1 spray by mouth every other day. Alternating nostrils      . calcium carbonate 200 MG capsule Take 1,000 mg by mouth 2 (two) times daily with  a meal.       . Cholecalciferol (VITAMIN D PO) Take 5,000 Units by mouth daily.        . Chromium 500 MCG TABS Take 1 tablet by mouth daily.        Marland Kitchen ezetimibe (ZETIA) 10 MG tablet Take 10 mg by mouth daily.        . fish oil-omega-3 fatty acids 1000 MG capsule Take 1 g by mouth 2 (two) times daily.       . fluticasone (FLONASE) 50 MCG/ACT nasal spray       . furosemide (LASIX) 40 MG tablet Take 1 tablet (40 mg total) by mouth daily.  30 tablet  12  . L-Methylfolate-B6-B12 (METANX) 3-35-2 MG TABS Take 1 tablet by mouth 2 (two) times daily.        Marland Kitchen LANTUS SOLOSTAR 100 UNIT/ML injection 5 units nightly      . losartan (COZAAR) 25 MG tablet Take 1 tablet (25 mg total) by mouth daily.  90 tablet  3  . MAGNESIUM PO Take 125 mg by mouth daily.      . methadone (DOLOPHINE) 10 MG tablet Take 10 mg by mouth 2 (two) times daily.        . Multiple Vitamin (MULTIVITAMIN) capsule Take  1 capsule by mouth daily.        . nitroGLYCERIN (NITROSTAT) 0.4 MG SL tablet Place 1 tablet (0.4 mg total) under the tongue every 5 (five) minutes as needed for chest pain.  25 tablet  3  . NOVOLOG 100 UNIT/ML injection       . oxyCODONE-acetaminophen (PERCOCET/ROXICET) 5-325 MG per tablet as needed.      . pravastatin (PRAVACHOL) 80 MG tablet TAKE 1 TABLET BY MOUTH EVERY EVENING  90 tablet  0  . predniSONE (DELTASONE) 10 MG tablet Take 10 mg by mouth daily.        . Selenium 200 MCG CAPS Take 1 capsule by mouth daily.        . sirolimus (RAPAMUNE) 1 MG tablet Take 1 mg by mouth every other day.        . warfarin (COUMADIN) 5 MG tablet 3 mg Mon-Wed-Fri/ 2 mg Tue-Thur- Sat-Sun      . doxycycline (VIBRAMYCIN) 100 MG capsule Take 1 tablet by mouth Twice daily.      . insulin lispro (HUMALOG) 100 UNIT/ML injection Inject 12-15 Units into the skin 3 (three) times daily before meals. 12 units bid and 15 units once daily      . trimethoprim-polymyxin b (POLYTRIM) ophthalmic solution        No current facility-administered medications for this visit.    REVIEW OF SYSTEMS: Arly.Keller ] denotes positive finding; [  ] denotes negative finding  CARDIOVASCULAR:  [ ]  chest pain   [ ]  chest pressure   [ ]  palpitations   [ ]  orthopnea   [ ]  dyspnea on exertion   [ ]  claudication   [ ]  rest pain   [ ]  DVT   [ ]  phlebitis PULMONARY:   [ ]  productive cough   [ ]  asthma   [ ]  wheezing NEUROLOGIC:   [ ]  weakness  Arly.Keller ] paresthesias in both feet related to her neuropathy. [ ]  aphasia  [ ]  amaurosis  [ ]  dizziness HEMATOLOGIC:   [ ]  bleeding problems   [ ]  clotting disorders MUSCULOSKELETAL:  [ ]  joint pain   [ ]  joint swelling Arly.Keller ] leg swelling GASTROINTESTINAL: [ ]   blood  in stool  [ ]   hematemesis GENITOURINARY:  [ ]   dysuria  [ ]   hematuria PSYCHIATRIC:  [ ]  history of major depression INTEGUMENTARY:  [ ]  rashes  [ ]  ulcers CONSTITUTIONAL:  [ ]  fever   [ ]  chills  PHYSICAL EXAM: Filed Vitals:   10/22/12 1315   BP: 128/53  Pulse: 73  Resp: 14  Height: 5\' 4"  (1.626 m)  Weight: 137 lb (62.143 kg)  SpO2: 100%   Body mass index is 23.5 kg/(m^2). GENERAL: The patient is a well-nourished female, in no acute distress. The vital signs are documented above. CARDIOVASCULAR: There is a regular rate and rhythm. I do not detect carotid bruits. She has palpable femoral pulses.I cannot palpate pedal pulses. She does have monophasic dorsalis pedis and posterior tibial signals bilaterally. She has significant right lower Shri swelling to the groin and also to a lesser degree some left lower extremity swelling. PULMONARY: There is good air exchange bilaterally without wheezing or rales. ABDOMEN: Soft and non-tender with normal pitched bowel sounds.  MUSCULOSKELETAL: There are no major deformities or cyanosis. NEUROLOGIC: No focal weakness or paresthesias are detected. SKIN: she has mild cellulitis of both lower extremities. PSYCHIATRIC: The patient has a normal affect.  DATA:  I have reviewed her records from her last office visit with Gatha Mayer, NP. She has a history of coronary artery disease this had no recent problems. She has diabetes which has been reasonably well controlled.  I have independently interpreted her venous duplex scan today which shows no evidence of DVT in either lower extremity. There is no evidence of superficial thrombophlebitis. There is no reflux in the greater saphenous veins on either side. There is mild reflux in the deep venous systems of the right and left common femoral veins.  MEDICAL ISSUES:  Lymphedema Based on this patient's history and physical examination, I think this patient has lymphedema bilaterally, but more significantly on the right side. Her duplex does show some mild venous insufficiency however I think a bigger issue is lymphedema. Regardless the treatment is the same. We have discussed the importance of intermittent leg elevation and the proper positioning for  this. In addition I have encouraged her to continue to wear her compression stockings. She understands if she does get episodes of cellulitis that these may require prolonged antibiotics given the stagnant lymphatic fluid. I think she does have reasonable arterial perfusion based on my Doppler exam. I'll be happy to see her back at any time if any new vascular issues arise.   Ramal Eckhardt S [facility] [place] Beeper: [phone]

## 2012-10-27 ENCOUNTER — Encounter: Payer: Self-pay | Admitting: Cardiology

## 2012-10-27 LAB — PROTIME-INR

## 2012-10-29 ENCOUNTER — Encounter: Payer: Self-pay | Admitting: *Deleted

## 2012-10-29 NOTE — Telephone Encounter (Signed)
Follow up call   Returning call back to nurse  

## 2012-10-29 NOTE — Telephone Encounter (Signed)
Spoke with pt. INR 10/27/12 2.2. INRs from  other provider scanned in to Epic. Pt scheduled for cardioversion 10/31/12 at 1PM for Dr Shirlee Latch. Pt to come to office for EKG and POC INR 10/31/12 10 AM.  Reviewed instructions with patient.

## 2012-10-31 ENCOUNTER — Other Ambulatory Visit: Payer: Self-pay | Admitting: Cardiology

## 2012-10-31 ENCOUNTER — Ambulatory Visit (HOSPITAL_COMMUNITY): Payer: Medicare Other | Admitting: Anesthesiology

## 2012-10-31 ENCOUNTER — Ambulatory Visit (HOSPITAL_COMMUNITY)
Admission: RE | Admit: 2012-10-31 | Discharge: 2012-10-31 | Disposition: A | Discharge status: Home / Self Care | Payer: Medicare Other | Source: Ambulatory Visit | Attending: Cardiology | Admitting: Cardiology

## 2012-10-31 ENCOUNTER — Encounter (HOSPITAL_COMMUNITY)
Admission: RE | Disposition: A | Discharge status: Home / Self Care | Payer: Self-pay | Source: Ambulatory Visit | Attending: Cardiology

## 2012-10-31 ENCOUNTER — Encounter (HOSPITAL_COMMUNITY): Payer: Self-pay | Admitting: Anesthesiology

## 2012-10-31 ENCOUNTER — Ambulatory Visit (INDEPENDENT_AMBULATORY_CARE_PROVIDER_SITE_OTHER): Payer: Medicare Other

## 2012-10-31 ENCOUNTER — Encounter (INDEPENDENT_AMBULATORY_CARE_PROVIDER_SITE_OTHER): Payer: Medicare Other | Admitting: *Deleted

## 2012-10-31 DIAGNOSIS — I4891 Unspecified atrial fibrillation: Secondary | ICD-10-CM | POA: Insufficient documentation

## 2012-10-31 DIAGNOSIS — Z7901 Long term (current) use of anticoagulants: Secondary | ICD-10-CM

## 2012-10-31 HISTORY — PX: CARDIOVERSION: SHX1299

## 2012-10-31 SURGERY — CARDIOVERSION
Anesthesia: Monitor Anesthesia Care

## 2012-10-31 MED ORDER — SODIUM CHLORIDE 0.9 % IV SOLN
INTRAVENOUS | Status: DC
  Administered 2012-10-31 (×2): via INTRAVENOUS

## 2012-10-31 MED ORDER — PROPOFOL 10 MG/ML IV BOLUS
INTRAVENOUS | Status: DC | PRN
  Administered 2012-10-31: 55 mg via INTRAVENOUS

## 2012-10-31 NOTE — Procedures (Signed)
Electrical Cardioversion Procedure Note Cynthia Hicks [id number] [birth year]  Procedure: Electrical Cardioversion Indications:  Atrial Fibrillation.  She has had a therapeutic INR for a month.   Procedure Details Consent: Risks of procedure as well as the alternatives and risks of each were explained to the (patient/caregiver).  Consent for procedure obtained. Time Out: Verified patient identification, verified procedure, site/side was marked, verified correct patient position, special equipment/implants available, medications/allergies/relevent history reviewed, required imaging and test results available.  Performed  Patient placed on cardiac monitor, pulse oximetry, supplemental oxygen as necessary.  Sedation given: Propofol per anesthesiology Pacer pads placed anterior and posterior chest.  Cardioverted 2 time(s).  Cardioverted at 200J with sternal pressure.   Evaluation Findings: Post procedure EKG shows: NSR Complications: None Patient did tolerate procedure well.   Cynthia Hicks 10/31/2012, 1:28 PM

## 2012-10-31 NOTE — Transfer of Care (Signed)
Immediate Anesthesia Transfer of Care Note  Patient: Cynthia Hicks  Procedure(s) Performed: Procedure(s): CARDIOVERSION (N/A)  Patient Location: PACU  Anesthesia Type:MAC  Level of Consciousness: awake, alert  and oriented  Airway & Oxygen Therapy: Patient Spontanous Breathing and Patient connected to nasal cannula oxygen  Post-op Assessment: Report given to PACU RN and Post -op Vital signs reviewed and stable  Post vital signs: Reviewed and stable  Complications: No apparent anesthesia complications

## 2012-10-31 NOTE — Anesthesia Preprocedure Evaluation (Addendum)
Anesthesia Evaluation  Patient identified by MRN, date of birth, ID band Patient awake    Reviewed: Allergy & Precautions, H&P , NPO status , Patient's Chart, lab work & pertinent test results  Airway Mallampati: II TM Distance: >3 FB Neck ROM: Full    Dental   Pulmonary  breath sounds clear to auscultation        Cardiovascular hypertension, + CAD, + Past MI, + Peripheral Vascular Disease and +CHF + dysrhythmias Atrial Fibrillation Rhythm:Irregular     Neuro/Psych    GI/Hepatic GERD-  ,(+) Hepatitis -, Autoimmune  Endo/Other  diabetes, Well Controlled, Type 2, Insulin Dependent  Renal/GU Renal disease     Musculoskeletal   Abdominal   Peds  Hematology   Anesthesia Other Findings   Reproductive/Obstetrics                          Anesthesia Physical Anesthesia Plan  ASA: III  Anesthesia Plan: General   Post-op Pain Management:    Induction: Intravenous  Airway Management Planned: Mask  Additional Equipment:   Intra-op Plan:   Post-operative Plan:   Informed Consent: I have reviewed the patients History and Physical, chart, labs and discussed the procedure including the risks, benefits and alternatives for the proposed anesthesia with the patient or authorized representative who has indicated his/her understanding and acceptance.     Plan Discussed with: CRNA, Anesthesiologist and Surgeon  Anesthesia Plan Comments:         Anesthesia Quick Evaluation

## 2012-10-31 NOTE — Preoperative (Addendum)
Beta Blockers   Reason not to administer Beta Blockers:Not Applicable 

## 2012-10-31 NOTE — Anesthesia Postprocedure Evaluation (Signed)
  Anesthesia Post-op Note  Patient: Cynthia Hicks  Procedure(s) Performed: Procedure(s): CARDIOVERSION (N/A)  Patient Location: PACU  Anesthesia Type:MAC  Level of Consciousness: awake, alert  and oriented  Airway and Oxygen Therapy: Patient Spontanous Breathing and Patient connected to nasal cannula oxygen  Post-op Pain: none  Post-op Assessment: Post-op Vital signs reviewed and Patient's Cardiovascular Status Stable  Post-op Vital Signs: Reviewed and stable  Complications: No apparent anesthesia complications

## 2012-11-03 ENCOUNTER — Encounter (HOSPITAL_COMMUNITY): Payer: Self-pay | Admitting: Cardiology

## 2012-11-13 ENCOUNTER — Ambulatory Visit (INDEPENDENT_AMBULATORY_CARE_PROVIDER_SITE_OTHER): Payer: Medicare Other | Admitting: Cardiology

## 2012-11-13 ENCOUNTER — Encounter: Payer: Self-pay | Admitting: Cardiology

## 2012-11-13 VITALS — BP 124/69 | HR 61 | Ht 64.0 in | Wt 143.0 lb

## 2012-11-13 DIAGNOSIS — E785 Hyperlipidemia, unspecified: Secondary | ICD-10-CM

## 2012-11-13 DIAGNOSIS — I2510 Atherosclerotic heart disease of native coronary artery without angina pectoris: Secondary | ICD-10-CM

## 2012-11-13 DIAGNOSIS — I5022 Chronic systolic (congestive) heart failure: Secondary | ICD-10-CM

## 2012-11-13 DIAGNOSIS — I509 Heart failure, unspecified: Secondary | ICD-10-CM

## 2012-11-13 DIAGNOSIS — N189 Chronic kidney disease, unspecified: Secondary | ICD-10-CM

## 2012-11-13 MED ORDER — PRAVASTATIN SODIUM 80 MG PO TABS: 80.0000 mg | ORAL_TABLET | Freq: Every evening | ORAL | Status: DC

## 2012-11-13 MED ORDER — LOSARTAN POTASSIUM 25 MG PO TABS: 25.0000 mg | ORAL_TABLET | Freq: Every day | ORAL | Status: DC

## 2012-11-13 MED ORDER — BISOPROLOL FUMARATE 5 MG PO TABS: 5.0000 mg | ORAL_TABLET | Freq: Every day | ORAL | Status: DC

## 2012-11-13 MED ORDER — FUROSEMIDE 40 MG PO TABS: ORAL_TABLET | ORAL | Status: DC

## 2012-11-13 NOTE — Patient Instructions (Addendum)
Increase lasix(furosmide) to 40mg  in the AM and 20mg  in the PM. This will be 1 of your 40mg  tablets in the AM and 1/2 of your 40mg  tablet (total 20mg ) in the PM.  Your physician recommends that you return for lab work in: 2weeks--BMET/BNP. Please fax the results to Dr Shirlee Latch. [phone].  Your physician recommends that you schedule a follow-up appointment in: 6 weeks with Dr Shirlee Latch.

## 2012-11-16 NOTE — Progress Notes (Signed)
Patient ID: Cynthia Hicks, female   DOB: Mar 10, 1933, 79 y.o.   MRN: [id number] PCP: Dr. Donnel Saxon  79 yo with history of CAD and autoimmune hepatitis presents for cardiology followup.  She was admitted in 1/13 with NSTEMI.  Left heart cath showed 80% mid LAD stenosis and 99% mid CFX stenosis (likely culprit).  EF was 40-45% by echo and LV-gram.  She had a PROMUS DES to the CFX.  No intervention to the LAD.   Lexiscan myoview done subsequently in 2/13 showed EF 49% with no ischemia.  Echo 4/13 showed EF around 45-50% with basal to mid inferior and mid-apical posterior akinesis along with mild MR.  She was admitted in 12/13 at [facility] with severe cellulitis and sepsis syndrome.  She was seen here by Norma Fredrickson later in 12/13 and found to be in atrial fibrillation.  Coumadin was started and is followed by her PCP. In 3/14, I cardioverted her to NSR.    She continues to have back pain from lumbar disc disease and sciatica.  The pain has significantly limited her walking, and she is now on methadone.   She walks for about 10 minutes twice a day.  She is using a walker or a cane because of the back pain.  No chest pain, no dyspnea.  She is using compression stockings for lower extremity edema.  There is probably a component of lymphedema.  Weight is up about 5 lbs compared to prior appointment despite taking Lasix.   ECG: NSR, anterior T wave flattening, poor anterior R wave progression, Qs in V1 and V2.   Labs (4/13): LDL 65, HDL 101, K 3.7, creatinine 1.46, AST 26, ALT 38 Labs (5/13): K 4.4, creatinine 1.54 Labs (10/13): LDL 100, HDL 87 Labs (1/14): K 4.3, creatinine 1.76 Labs (2/14): LDL 65, HDL 109, TGs 111 Labs (4/14): creatinine 1.4  PMH: 1. CAD: NSTEMI 1/07.  LHC showed 80-90% mid LAD stenosis and 95% mid RCA stenosis.  She had Taxus DES x 2 (overlapping) to the RCA.  Procedure was complicated by wire perforation of PLV, PLV was coil embolized.  Myoview 2009 showed no ischemia or infarction.   NSTEMI 1/13.  LHC with 80% mid LAD (same as 2007), 99% mid CFX, 70% distal RCA.  PROMUS DES to CFX.  Lexiscan myoview (2/13, to assess LAD territory): EF 49%, no ischemia.  2. Hyperlipidemia: Unable to take Crestor or Lipitor due to leg weakness.  3. Autoimmune hepatitis: Followed at [facility].  On prednisone and sirolimus.   4. Lumbar disc disease/sciatica.  5. CKD 6. Chronic venous insufficiency with chronic R>L lower leg swelling.  7. Paroxysmal atrial fibrillation: Only documented episode was in 7/07.  8. SVT: suppressed by beta blocker.  9. Ischemic cardiomyopathy: Echo (1/13) with EF 40-45%.  EF 40% by LV-gram (1/13).  Echo (4/13): EF 45-50% with basal to mid inferior and mid to apical posterior akinesis and moderate MR.  10. Carotid dopplers (12/12) with minimal disease.  11. Cholelithiasis/choledocholithiasis s/p ERCP and sphincterotomy in 2013.  12. Atrial fibrillation: First noted in 12/13.  DCCV 3/14.   SH: Lives in [place], married, retired Scientist, research (physical sciences).  Never smoked.    FH: No premature CAD.    ROS: All systems reviewed and negative except as per HPI.   Current Outpatient Prescriptions  Medication Sig Dispense Refill  . Ascorbic Acid (VITAMIN C) 1000 MG tablet Take 1,000 mg by mouth 2 (two) times daily.       . bisoprolol (  ZEBETA) 5 MG tablet Take 1 tablet (5 mg total) by mouth daily.  90 tablet  3  . calcitonin, salmon, (MIACALCIN/FORTICAL) 200 UNIT/ACT nasal spray Take 1 spray by mouth every other day. Alternating nostrils      . calcium carbonate 200 MG capsule Take 1,000 mg by mouth 2 (two) times daily with a meal.       . Cholecalciferol (VITAMIN D PO) Take 5,000 Units by mouth daily.        . Chromium 500 MCG TABS Take 1 tablet by mouth daily.        Marland Kitchen ezetimibe (ZETIA) 10 MG tablet Take 10 mg by mouth daily.        . fish oil-omega-3 fatty acids 1000 MG capsule Take 1 g by mouth 2 (two) times daily.       . fluticasone (FLONASE) 50 MCG/ACT nasal spray       .  furosemide (LASIX) 40 MG tablet 1 tablet in the AM( total 40mg ) and 1/2  tablet (total 20mg ) in the PM  135 tablet  3  . L-Methylfolate-B6-B12 (METANX) 3-35-2 MG TABS Take 1 tablet by mouth 2 (two) times daily.        Marland Kitchen LANTUS SOLOSTAR 100 UNIT/ML injection 5 units nightly      . losartan (COZAAR) 25 MG tablet Take 1 tablet (25 mg total) by mouth daily.  90 tablet  3  . MAGNESIUM PO Take 125 mg by mouth daily.      . methadone (DOLOPHINE) 10 MG tablet Take 10 mg by mouth 2 (two) times daily.        . Multiple Vitamin (MULTIVITAMIN) capsule Take 1 capsule by mouth daily.        . nitroGLYCERIN (NITROSTAT) 0.4 MG SL tablet Place 1 tablet (0.4 mg total) under the tongue every 5 (five) minutes as needed for chest pain.  25 tablet  3  . NOVOLOG 100 UNIT/ML injection 05-15-14      . oxyCODONE-acetaminophen (PERCOCET/ROXICET) 5-325 MG per tablet as needed.      . pravastatin (PRAVACHOL) 80 MG tablet TAKE 1 TABLET BY MOUTH EVERY EVENING  90 tablet  0  . predniSONE (DELTASONE) 10 MG tablet Take 10 mg by mouth daily.        . Selenium 200 MCG CAPS Take 1 capsule by mouth daily.        . sirolimus (RAPAMUNE) 1 MG tablet Take 1 mg by mouth every other day.        . warfarin (COUMADIN) 5 MG tablet 3 mg Mon-Wed-Fri/ 2 mg Tue-Thur- Sat-Sun      . pravastatin (PRAVACHOL) 80 MG tablet Take 1 tablet (80 mg total) by mouth every evening.  90 tablet  3   No current facility-administered medications for this visit.   BP 124/69  Pulse 61  Ht 5\' 4"  (1.626 m)  Wt 143 lb (64.864 kg)  BMI 24.53 kg/m2 General: NAD Neck: JVP 8 cm, no thyromegaly or thyroid nodule.  Lungs: Clear to auscultation bilaterally with normal respiratory effort. CV: Nondisplaced PMI.  Heart regular S1/S2, no S3/S4, no murmur.  1+ edema to knees bilaterally.    Abdomen: Soft, nontender, no hepatosplenomegaly, no distention.   Neurologic: Alert and oriented x 3.  Psych: Normal affect. Extremities: No clubbing or cyanosis.    Assessment/Plan:  Chronic kidney disease  Creatinine has been stable.   Coronary artery disease  NSTEMI with Promus DES to CFX in 1/13. Has residual 80% mid LAD stenosis. This  was also seen on 2007 cath, and [facility] after PCI showed no ischemia in LAD territory.  - Mrs Hochstein is on coumadin now so Plavix has been stopped . - She will continue ARB, beta blocker, and statin.  HYPERLIPIDEMIA Good lipids on current statin, continue.   Systolic CHF, chronic Ischemic cardiomyopathy, EF 45-50% on last echo. Continue bisoprolol and losartan at current doses. No significant dyspnea though she is limited in her activity level by back pain. She still appears volume overloaded on exam. - Increase Lasix to 40 qam, 20 qpm.  - BMET/BNP in 7 days.  Atrial fibrillation She is back in NSR after cardioversion in 3/14.  Continue warfarin.  She has CKD and creatinine fluctuates.  I will keep her on coumadin rather than starting a NOAC.    [name] 11/16/2012

## 2012-11-28 ENCOUNTER — Encounter: Payer: Self-pay | Admitting: Cardiology

## 2012-12-16 ENCOUNTER — Telehealth: Payer: Self-pay | Admitting: Cardiology

## 2012-12-16 NOTE — Telephone Encounter (Signed)
Pt visited her dentist about a month ago, and discussed to have   a tooth pulled  and put an implant to replaced that tooth the same day. Pt would like to know if she needs to be off coumadin prior this procedure. Pt is putting these procedure off for later, because she has some other issues to work on. Pt is made aware that when she is ready for this procedure her dentist's office needs to call  us to see how long pt needs to be off coumodin prior this procedure and if Dr. Shirlee Latch aproved for pt to be off this medication. Pt verbalized understanding.

## 2012-12-16 NOTE — Telephone Encounter (Signed)
New problem   Pt having dental implant and tooth pulled and wants to know if she should stop coumadin before procedure

## 2013-01-01 ENCOUNTER — Ambulatory Visit (INDEPENDENT_AMBULATORY_CARE_PROVIDER_SITE_OTHER): Payer: Medicare Other | Admitting: Cardiology

## 2013-01-01 ENCOUNTER — Encounter: Payer: Self-pay | Admitting: Cardiology

## 2013-01-01 VITALS — BP 132/62 | HR 60 | Ht 64.0 in | Wt 139.0 lb

## 2013-01-01 DIAGNOSIS — E785 Hyperlipidemia, unspecified: Secondary | ICD-10-CM

## 2013-01-01 DIAGNOSIS — I5032 Chronic diastolic (congestive) heart failure: Secondary | ICD-10-CM

## 2013-01-01 DIAGNOSIS — I2510 Atherosclerotic heart disease of native coronary artery without angina pectoris: Secondary | ICD-10-CM

## 2013-01-01 DIAGNOSIS — I4891 Unspecified atrial fibrillation: Secondary | ICD-10-CM

## 2013-01-01 DIAGNOSIS — N189 Chronic kidney disease, unspecified: Secondary | ICD-10-CM

## 2013-01-01 DIAGNOSIS — I5022 Chronic systolic (congestive) heart failure: Secondary | ICD-10-CM

## 2013-01-01 DIAGNOSIS — I509 Heart failure, unspecified: Secondary | ICD-10-CM

## 2013-01-01 DIAGNOSIS — R0602 Shortness of breath: Secondary | ICD-10-CM

## 2013-01-01 LAB — BASIC METABOLIC PANEL
BUN: 34 mg/dL — ABNORMAL HIGH (ref 6–23)
Creatinine, Ser: 1.7 mg/dL — ABNORMAL HIGH (ref 0.4–1.2)
GFR: 31.37 mL/min — ABNORMAL LOW (ref >60.00)
Glucose, Bld: 150 mg/dL — ABNORMAL HIGH (ref 70–99)
Potassium: 4.6 mEq/L (ref 3.5–5.1)

## 2013-01-01 NOTE — Patient Instructions (Addendum)
Your physician recommends that you have lab work today--BMET/BNP.   Your physician has requested that you have a lexiscan myoview. For further information please visit [email or web address] Please follow instruction sheet, as given.  Your physician recommends that you schedule a follow-up appointment in: 4 months with Dr Shirlee Latch. Cynthia Hicks

## 2013-01-01 NOTE — Progress Notes (Signed)
Patient ID: Cynthia Hicks, female   DOB: 1933/07/25, 79 y.o.   MRN: [id number] PCP: Dr. Donnel Saxon  79 yo with history of CAD and autoimmune hepatitis presents for cardiology followup.  She was admitted in 1/13 with NSTEMI.  Left heart cath showed 80% mid LAD stenosis and 99% mid CFX stenosis (likely culprit).  EF was 40-45% by echo and LV-gram.  She had a PROMUS DES to the CFX.  No intervention to the LAD.   Lexiscan myoview done subsequently in 2/13 showed EF 49% with no ischemia.  Echo 4/13 showed EF around 45-50% with basal to mid inferior and mid-apical posterior akinesis along with mild MR.  She was admitted in 12/13 at [facility] with severe cellulitis and sepsis syndrome.  She was seen here by Norma Fredrickson later in 12/13 and found to be in atrial fibrillation.  Coumadin was started and is followed by her PCP. In 3/14, I cardioverted her to NSR.  She remains in NSR today.   She continues to have back pain from lumbar disc disease and sciatica.  The pain has significantly limited her walking, and she is now on methadone.   She walks for about 10 minutes twice a day.  She is using a walker or a cane because of the back pain.  No chest pain, no dyspnea though she is not very active.  She does tire easily.  Weight is down 4 lbs since increasing Lasix.  She has symptomatic cholelithiasis and may have cholecystectomy soon.    Labs (4/13): LDL 65, HDL 101, K 3.7, creatinine 1.46, AST 26, ALT 38 Labs (5/13): K 4.4, creatinine 1.54 Labs (10/13): LDL 100, HDL 87 Labs (1/14): K 4.3, creatinine 1.76 Labs (2/14): LDL 65, HDL 109, TGs 111 Labs (4/14): creatinine 1.4 => 1.47, K 4.1, BNP 381  PMH: 1. CAD: NSTEMI 1/07.  LHC showed 80-90% mid LAD stenosis and 95% mid RCA stenosis.  She had Taxus DES x 2 (overlapping) to the RCA.  Procedure was complicated by wire perforation of PLV, PLV was coil embolized.  Myoview 2009 showed no ischemia or infarction.  NSTEMI 1/13.  LHC with 80% mid LAD (same as 2007), 99% mid  CFX, 70% distal RCA.  PROMUS DES to CFX.  Lexiscan myoview (2/13, to assess LAD territory): EF 49%, no ischemia.  2. Hyperlipidemia: Unable to take Crestor or Lipitor due to leg weakness.  3. Autoimmune hepatitis: Followed at [facility].  On prednisone and sirolimus.   4. Lumbar disc disease/sciatica.  5. CKD 6. Chronic venous insufficiency with chronic R>L lower leg swelling.  7. Paroxysmal atrial fibrillation: Only documented episode was in 7/07.  8. SVT: suppressed by beta blocker.  9. Ischemic cardiomyopathy: Echo (1/13) with EF 40-45%.  EF 40% by LV-gram (1/13).  Echo (4/13): EF 45-50% with basal to mid inferior and mid to apical posterior akinesis and moderate MR.  10. Carotid dopplers (12/12) with minimal disease.  11. Cholelithiasis/choledocholithiasis s/p ERCP and sphincterotomy in 2013.  12. Atrial fibrillation: First noted in 12/13.  DCCV 3/14.   SH: Lives in [place], married, retired Scientist, research (physical sciences).  Never smoked.    FH: No premature CAD.    ROS: All systems reviewed and negative except as per HPI.   Current Outpatient Prescriptions  Medication Sig Dispense Refill  . Ascorbic Acid (VITAMIN C) 1000 MG tablet Take 1,000 mg by mouth 2 (two) times daily.       . bisoprolol (ZEBETA) 5 MG tablet Take 1 tablet (5  mg total) by mouth daily.  90 tablet  3  . calcitonin, salmon, (MIACALCIN/FORTICAL) 200 UNIT/ACT nasal spray Take 1 spray by mouth every other day. Alternating nostrils      . calcium carbonate 200 MG capsule Take 1,000 mg by mouth 2 (two) times daily with a meal.       . Cholecalciferol (VITAMIN D PO) Take 5,000 Units by mouth daily.        . Chromium 500 MCG TABS Take 1 tablet by mouth daily.        Marland Kitchen ezetimibe (ZETIA) 10 MG tablet Take 10 mg by mouth daily.        . fish oil-omega-3 fatty acids 1000 MG capsule Take 1 g by mouth 2 (two) times daily.       . fluticasone (FLONASE) 50 MCG/ACT nasal spray       . furosemide (LASIX) 40 MG tablet 1 tablet in the AM( total 40mg )  and 1/2  tablet (total 20mg ) in the PM  135 tablet  3  . L-Methylfolate-B6-B12 (METANX) 3-35-2 MG TABS Take 1 tablet by mouth 2 (two) times daily.        Marland Kitchen LANTUS SOLOSTAR 100 UNIT/ML injection 5 units nightly      . losartan (COZAAR) 25 MG tablet Take 1 tablet (25 mg total) by mouth daily.  90 tablet  3  . MAGNESIUM PO Take 125 mg by mouth daily.      . methadone (DOLOPHINE) 10 MG tablet Take 10 mg by mouth 2 (two) times daily.        . Multiple Vitamin (MULTIVITAMIN) capsule Take 1 capsule by mouth daily.        . nitroGLYCERIN (NITROSTAT) 0.4 MG SL tablet Place 1 tablet (0.4 mg total) under the tongue every 5 (five) minutes as needed for chest pain.  25 tablet  3  . NOVOLOG 100 UNIT/ML injection 05-15-14      . oxyCODONE-acetaminophen (PERCOCET/ROXICET) 5-325 MG per tablet as needed.      . pravastatin (PRAVACHOL) 80 MG tablet Take 1 tablet (80 mg total) by mouth every evening.  90 tablet  3  . predniSONE (DELTASONE) 10 MG tablet Take 10 mg by mouth daily.        . Selenium 200 MCG CAPS Take 1 capsule by mouth daily.        . sirolimus (RAPAMUNE) 1 MG tablet Take 1 mg by mouth every other day.        . warfarin (COUMADIN) 5 MG tablet 3 mg Mon-Wed-Fri/ 2 mg Tue-Thur- Sat-Sun       No current facility-administered medications for this visit.   BP 132/62  Pulse 60  Ht 5\' 4"  (1.626 m)  Wt 139 lb (63.05 kg)  BMI 23.85 kg/m2  SpO2 98% General: NAD Neck: JVP 7 cm, no thyromegaly or thyroid nodule.  Lungs: Clear to auscultation bilaterally with normal respiratory effort. CV: Nondisplaced PMI.  Heart regular S1/S2, no S3/S4, no murmur.  1+ edema 1/3 to knees bilaterally (improved).    Abdomen: Soft, nontender, no hepatosplenomegaly, no distention.   Neurologic: Alert and oriented x 3.  Psych: Normal affect. Extremities: No clubbing or cyanosis.   Assessment/Plan:  Chronic kidney disease  Creatinine has been stable.   Coronary artery disease  NSTEMI with Promus DES to CFX in 1/13. Has  residual 80% mid LAD stenosis. This was also seen on 2007 cath, and [facility] after PCI showed no ischemia in LAD territory.  - Mrs Besaw is  on coumadin now so Plavix has been stopped . - She will continue ARB, beta blocker, and statin.  - She is going to undergo cholecystectomy soon.  She is not very active due to back pain.  Given the significant unrevascularized LAD disease and poor functional capacity, I think she ought to get a Lexiscan Sestamibi prior to surgery.  I will arrange for this.  HYPERLIPIDEMIA Good lipids on current statin, continue.   Systolic CHF, chronic Ischemic cardiomyopathy, EF 45-50% on last echo. Continue bisoprolol and losartan at current doses. No significant dyspnea though she is limited in her activity level by back pain. Volume status appears better today compared to last appointment. - Continue current Lasix dosing.   - BMET/BNP today.   Atrial fibrillation She is in NSR after cardioversion in 3/14.  Continue warfarin.  She has CKD and creatinine fluctuates.  I will keep her on coumadin rather than starting a NOAC.    [name] 01/01/2013

## 2013-01-29 ENCOUNTER — Ambulatory Visit (HOSPITAL_COMMUNITY): Payer: Medicare Other | Attending: Cardiology | Admitting: Radiology

## 2013-01-29 DIAGNOSIS — R0602 Shortness of breath: Secondary | ICD-10-CM

## 2013-01-29 DIAGNOSIS — E785 Hyperlipidemia, unspecified: Secondary | ICD-10-CM

## 2013-01-29 DIAGNOSIS — I5032 Chronic diastolic (congestive) heart failure: Secondary | ICD-10-CM

## 2013-01-29 DIAGNOSIS — I2510 Atherosclerotic heart disease of native coronary artery without angina pectoris: Secondary | ICD-10-CM

## 2013-01-29 DIAGNOSIS — R0989 Other specified symptoms and signs involving the circulatory and respiratory systems: Secondary | ICD-10-CM

## 2013-01-29 MED ORDER — TECHNETIUM TC 99M SESTAMIBI GENERIC - CARDIOLITE
33.0000 | Freq: Once | INTRAVENOUS | Status: AC | PRN
  Administered 2013-01-29: 33 via INTRAVENOUS

## 2013-02-05 ENCOUNTER — Ambulatory Visit (HOSPITAL_COMMUNITY): Payer: Medicare Other | Attending: Cardiovascular Disease | Admitting: Radiology

## 2013-02-05 VITALS — BP 130/55 | HR 60 | Ht 64.0 in | Wt 140.0 lb

## 2013-02-05 DIAGNOSIS — R5381 Other malaise: Secondary | ICD-10-CM | POA: Insufficient documentation

## 2013-02-05 DIAGNOSIS — Z01810 Encounter for preprocedural cardiovascular examination: Secondary | ICD-10-CM | POA: Insufficient documentation

## 2013-02-05 DIAGNOSIS — Z8249 Family history of ischemic heart disease and other diseases of the circulatory system: Secondary | ICD-10-CM | POA: Insufficient documentation

## 2013-02-05 DIAGNOSIS — I2510 Atherosclerotic heart disease of native coronary artery without angina pectoris: Secondary | ICD-10-CM

## 2013-02-05 DIAGNOSIS — R0602 Shortness of breath: Secondary | ICD-10-CM

## 2013-02-05 DIAGNOSIS — I779 Disorder of arteries and arterioles, unspecified: Secondary | ICD-10-CM | POA: Insufficient documentation

## 2013-02-05 DIAGNOSIS — R5383 Other fatigue: Secondary | ICD-10-CM | POA: Insufficient documentation

## 2013-02-05 DIAGNOSIS — I4891 Unspecified atrial fibrillation: Secondary | ICD-10-CM | POA: Insufficient documentation

## 2013-02-05 DIAGNOSIS — I252 Old myocardial infarction: Secondary | ICD-10-CM | POA: Insufficient documentation

## 2013-02-05 DIAGNOSIS — I10 Essential (primary) hypertension: Secondary | ICD-10-CM | POA: Insufficient documentation

## 2013-02-05 DIAGNOSIS — E119 Type 2 diabetes mellitus without complications: Secondary | ICD-10-CM | POA: Insufficient documentation

## 2013-02-05 DIAGNOSIS — R0989 Other specified symptoms and signs involving the circulatory and respiratory systems: Secondary | ICD-10-CM | POA: Insufficient documentation

## 2013-02-05 DIAGNOSIS — E785 Hyperlipidemia, unspecified: Secondary | ICD-10-CM | POA: Insufficient documentation

## 2013-02-05 DIAGNOSIS — R0609 Other forms of dyspnea: Secondary | ICD-10-CM | POA: Insufficient documentation

## 2013-02-05 DIAGNOSIS — I509 Heart failure, unspecified: Secondary | ICD-10-CM | POA: Insufficient documentation

## 2013-02-05 DIAGNOSIS — Z794 Long term (current) use of insulin: Secondary | ICD-10-CM | POA: Insufficient documentation

## 2013-02-05 DIAGNOSIS — Z9861 Coronary angioplasty status: Secondary | ICD-10-CM | POA: Insufficient documentation

## 2013-02-05 MED ORDER — TECHNETIUM TC 99M SESTAMIBI GENERIC - CARDIOLITE
30.0000 | Freq: Once | INTRAVENOUS | Status: AC | PRN
  Administered 2013-02-05: 30 via INTRAVENOUS

## 2013-02-05 MED ORDER — REGADENOSON 0.4 MG/5ML IV SOLN
0.4000 mg | Freq: Once | INTRAVENOUS | Status: AC
  Administered 2013-02-05: 0.4 mg via INTRAVENOUS

## 2013-02-05 NOTE — Progress Notes (Addendum)
[facility] SITE 3 NUCLEAR MED [address] [place], [place] [address] [phone]    Cardiology Nuclear Med Study  Cynthia Hicks is a 79 y.o. female     MRN : [id number]     DOB: Apr 20, 1933  Procedure Date: 02/05/2013  Nuclear Med Background Indication for Stress Test:  Evaluation for Ischemia and PTCA/Stent Patency, and Pending Surgical Clearance for Cholecystectomy (TBD) History:CHF/ ICM; "07 Stents x 2 RCA; '08-2011 NSTEMI> Cath> LAD 80%, CFX 99%> Stent CFX, EF=40-45%; 09-2011 Myocardial Perfusion Study-no ischemia, EF=49%;  12-03-11 Echo: EF=55-60%; 07-2012 Dx AFIB> '10-2012 Cardioversion Cardiac Risk Factors: Carotid Disease, Family History - CAD, Hypertension, IDDM Type 2 and Lipids  Symptoms:  DOE, Fatigue and Fatigue with Exertion   Nuclear Pre-Procedure Caffeine/Decaff Intake:  None > NPO After: 7:00am   Lungs:  clear O2 Sat: 97% on room air. IV 0.9% NS with Angio Cath:  22g  IV Site: R Antecubital x 1, tolerated well IV Started by:  Irean Hong, RN  Chest Size (in):  36 Cup Size: C  Height: 5\' 4"  (1.626 m)  Weight:  140 lb (63.504 kg)  BMI:  Body mass index is 24.02 kg/(m^2). Tech Comments:  Took Bisoprolol this am. 1/2 doses of Lantus and Novolog last night; no insulin today. Fasting CBG was 135 @ 7:00am per patient.    Nuclear Med Study 1 or 2 day study: 2 day  Stress Test Type:  Eugenie Birks  Reading MD: Kristeen Miss, MD  Order Authorizing Provider:  [name], MD  Resting Radionuclide: Technetium 58m Sestamibi  Resting Radionuclide Dose: 33.0 mCi on 01/29/13   Stress Radionuclide:  Technetium 61m Sestamibi  Stress Radionuclide Dose: 33.0 mCi on 02/05/13           Stress Protocol Rest HR: 60 Stress HR: 86  Rest BP: 130/55 Stress BP: 149/61  Exercise Time (min): n/a METS: n/a   Predicted Max HR: 143 bpm % Max HR: 60.14 bpm Rate Pressure Product: 46962   Dose of Adenosine (mg):  n/a Dose of Lexiscan: 0.4 mg  Dose of Atropine (mg): n/a  Dose of Dobutamine: n/a mcg/kg/min (at max HR)  Stress Test Technologist: Irean Hong, RN  Nuclear Technologist:  Doyne Keel, CNMT     Rest Procedure:  Myocardial perfusion imaging was performed at rest 45 minutes following the intravenous administration of Technetium 69m Sestamibi. Rest ECG: NSR - Normal EKG  Stress Procedure:  The patient received IV Lexiscan 0.4 mg over 15-seconds.  Technetium 6m Sestamibi injected at 30-seconds. The patient complained of chest pain with Lexiscan.  Quantitative spect images were obtained after a 45 minute delay. Stress ECG: No significant change from baseline ECG  QPS Raw Data Images:  There is interference from nuclear activity from structures below the diaphragm. This  affects the ability to read the study. Stress Images:  There is significant uptake in the structures below the diaphragm.  The stress images of  most of the LV are significantly attenuated.  Given that , the uptake seems to be fairly normal.   Rest Images:  There is significant uptake in the structures below the diaphragm.  The stress images of  most of the LV are significantly attenuated.  Given that , the uptake seems to be fairly normal Subtraction (SDS):  No evidence of ischemia. Transient Ischemic Dilatation (Normal <1.22):  n/a Lung/Heart Ratio (Normal <0.45):  0.32  Quantitative Gated Spect Images QGS EDV:  68 ml QGS ESV:  35 ml  Impression Exercise Capacity:  Lexiscan with no exercise. BP Response:  Normal blood pressure response. Clinical Symptoms:  No significant symptoms noted. ECG Impression:  No significant ST segment change suggestive of ischemia. Comparison with Prior Nuclear Study: No significant change from previous study  Overall Impression:  Low risk stress nuclear study There is significant bowel uptake which obscures the images of the LV .  I do not see any obvious ischemia, .  LV Ejection Fraction: 48%.  LV Wall Motion:  The LV function is mildly depressed.   there are no segmental wall motion abnormalities.    [name], Montez Hageman., MD, [facility] 02/05/2013, 3:44 PM Office - [phone] Pager [phone]  No ischemia but some limitation from gut uptake. EF mildly decreased, would confirm with echo.  [name] 02/06/2013

## 2013-02-09 NOTE — Progress Notes (Signed)
Left message on machine for pt to contact the office.   

## 2013-02-10 ENCOUNTER — Telehealth: Payer: Self-pay | Admitting: Cardiology

## 2013-02-10 DIAGNOSIS — I5032 Chronic diastolic (congestive) heart failure: Secondary | ICD-10-CM

## 2013-02-10 DIAGNOSIS — I4891 Unspecified atrial fibrillation: Secondary | ICD-10-CM

## 2013-02-10 NOTE — Progress Notes (Signed)
I spoke with the pt and made her aware or results. The pt does need a repeat Echo to assess her EF. Echo scheduled on 02/19/13.

## 2013-02-10 NOTE — Telephone Encounter (Signed)
I spoke with the pt and made her aware that Dr Shirlee Latch has reviewed the pt's myoview. The pt does need a repeat Echo to assess her EF. Echo scheduled on 02/19/13.

## 2013-02-10 NOTE — Telephone Encounter (Signed)
Returned call to patient Dr.McLean is out this week he has not reviewed myoview yet.We will call back after he has reviewed.

## 2013-02-10 NOTE — Telephone Encounter (Signed)
New Problem  Pt wants to know the results of her stress test.

## 2013-02-19 ENCOUNTER — Ambulatory Visit (HOSPITAL_COMMUNITY): Payer: Medicare Other | Attending: Cardiology | Admitting: Radiology

## 2013-02-19 DIAGNOSIS — I5032 Chronic diastolic (congestive) heart failure: Secondary | ICD-10-CM | POA: Insufficient documentation

## 2013-02-19 DIAGNOSIS — I4891 Unspecified atrial fibrillation: Secondary | ICD-10-CM

## 2013-02-19 NOTE — Progress Notes (Signed)
Echocardiogram performed.  

## 2013-02-28 DIAGNOSIS — B351 Tinea unguium: Secondary | ICD-10-CM | POA: Insufficient documentation

## 2013-02-28 DIAGNOSIS — K8050 Calculus of bile duct without cholangitis or cholecystitis without obstruction: Secondary | ICD-10-CM | POA: Insufficient documentation

## 2013-02-28 DIAGNOSIS — Z9225 Personal history of immunosupression therapy: Secondary | ICD-10-CM | POA: Insufficient documentation

## 2013-03-11 ENCOUNTER — Other Ambulatory Visit: Payer: Self-pay

## 2013-04-30 ENCOUNTER — Ambulatory Visit (INDEPENDENT_AMBULATORY_CARE_PROVIDER_SITE_OTHER): Payer: Medicare Other | Admitting: Cardiology

## 2013-04-30 ENCOUNTER — Encounter: Payer: Self-pay | Admitting: Cardiology

## 2013-04-30 VITALS — BP 128/64 | HR 72 | Ht 64.0 in | Wt 137.2 lb

## 2013-04-30 DIAGNOSIS — I2510 Atherosclerotic heart disease of native coronary artery without angina pectoris: Secondary | ICD-10-CM

## 2013-04-30 DIAGNOSIS — I4891 Unspecified atrial fibrillation: Secondary | ICD-10-CM

## 2013-04-30 DIAGNOSIS — N189 Chronic kidney disease, unspecified: Secondary | ICD-10-CM

## 2013-04-30 DIAGNOSIS — I5032 Chronic diastolic (congestive) heart failure: Secondary | ICD-10-CM

## 2013-04-30 DIAGNOSIS — I509 Heart failure, unspecified: Secondary | ICD-10-CM

## 2013-04-30 DIAGNOSIS — E785 Hyperlipidemia, unspecified: Secondary | ICD-10-CM

## 2013-04-30 NOTE — Patient Instructions (Addendum)
You will need to have fasting lab work drawn at your primary care physician. Lipid,lft, bmp  A prescription has been given to you for this & they will fax [place] your test results  Your physician recommends that you continue on your current medications as directed. Please refer to the Current Medication list given to you today.  Your physician recommends that you schedule a follow-up appointment in: 4 months with Dr. Shirlee Latch

## 2013-05-02 DIAGNOSIS — I5032 Chronic diastolic (congestive) heart failure: Secondary | ICD-10-CM | POA: Insufficient documentation

## 2013-05-02 NOTE — Progress Notes (Signed)
Patient ID: Cynthia Hicks, female   DOB: 1932-11-28, 79 y.o.   MRN: [id number] PCP: Dr. Donnel Saxon  79 yo with history of CAD and autoimmune hepatitis presents for cardiology followup.  She was admitted in 1/13 with NSTEMI.  Left heart cath showed 80% mid LAD stenosis and 99% mid CFX stenosis (likely culprit).  EF was 40-45% by echo and LV-gram.  She had a PROMUS DES to the CFX.  No intervention to the LAD.   Lexiscan myoview done subsequently in 2/13 showed EF 49% with no ischemia.  Echo 2022/11/19 showed EF around 45-50% with basal to mid inferior and mid-apical posterior akinesis along with mild MR.  She was admitted in 12/13 at [facility] with severe cellulitis and sepsis syndrome.  She was seen here by Norma Fredrickson later in 12/13 and found to be in atrial fibrillation.  Coumadin was started and is followed by her PCP. In 3/14, I cardioverted her to NSR.  She remains in NSR today.  In 02/19/23, she had an echo showing EF 55-60% with basal inferior hypokinesis and a Lexiscan Cardiolite that was low risk with no ischemia.   She continues to have back pain from lumbar disc disease and sciatica.  She has severe right shoulder arthritic pain and left hip pain.  The pain has significantly limited her walking, and she is now on methadone.   No chest pain, no dyspnea though she is not very active due to joint pains.  She does tire easily.  Weight is down 2 lbs since last appt. Unfortunately, her husband died in 2023-02-19.   Labs 19-Nov-2022): LDL 65, HDL 101, K 3.7, creatinine 1.46, AST 26, ALT 38 Labs (5/13): K 4.4, creatinine 1.54 Labs (10/13): LDL 100, HDL 87 Labs (1/14): K 4.3, creatinine 1.76 Labs (2/14): LDL 65, HDL 109, TGs 111 Labs (4/14): creatinine 1.4 => 1.47, K 4.1, BNP 381 Labs (5/14): K 4.6, creatinine 1.7  PMH: 1. CAD: NSTEMI 1/07.  LHC showed 80-90% mid LAD stenosis and 95% mid RCA stenosis.  She had Taxus DES x 2 (overlapping) to the RCA.  Procedure was complicated by wire perforation of PLV, PLV was coil  embolized.  Myoview 2009 showed no ischemia or infarction.  NSTEMI 1/13.  LHC with 80% mid LAD (same as 2007), 99% mid CFX, 70% distal RCA.  PROMUS DES to CFX.  Lexiscan myoview (2/13, to assess LAD territory): EF 49%, no ischemia.  Lexiscan Cardiolite Feb 19, 2023) with EF 48%, no ischemia.  2. Hyperlipidemia: Unable to take Crestor or Lipitor due to leg weakness.  3. Autoimmune hepatitis: Followed at [facility].  On prednisone and sirolimus.   4. Lumbar disc disease/sciatica.  5. CKD 6. Chronic venous insufficiency with chronic R>L lower leg swelling.  7. Paroxysmal atrial fibrillation: Only documented episode was in 7/07.  8. SVT: suppressed by beta blocker.  9. Ischemic cardiomyopathy: Echo (1/13) with EF 40-45%.  EF 40% by LV-gram (1/13).  Echo 2022-11-19): EF 45-50% with basal to mid inferior and mid to apical posterior akinesis and moderate MR.  Echo February 19, 2023) with EF 55-60%, basal inferior HK, mild LVH.  10. Carotid dopplers (12/12) with minimal disease.  11. Cholelithiasis/choledocholithiasis s/p ERCP and sphincterotomy in 2013.  12. Atrial fibrillation: First noted in 12/13.  DCCV 3/14.   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    FH: No premature CAD.    ROS: All systems reviewed and negative except as per HPI.   Current Outpatient Prescriptions  Medication Sig Dispense Refill  . Ascorbic Acid (VITAMIN C) 1000 MG tablet Take 1,000 mg by mouth 2 (two) times daily.       . bisoprolol (ZEBETA) 5 MG tablet Take 1 tablet (5 mg total) by mouth daily.  90 tablet  3  . calcium carbonate 200 MG capsule Take 1,000 mg by mouth 2 (two) times daily with a meal.       . Cholecalciferol (VITAMIN D PO) Take 5,000 Units by mouth daily.        . Chromium 500 MCG TABS Take 1 tablet by mouth daily.        Marland Kitchen ezetimibe (ZETIA) 10 MG tablet Take 10 mg by mouth daily.        . fish oil-omega-3 fatty acids 1000 MG capsule Take 1 g by mouth 2 (two) times daily.       . furosemide (LASIX)  40 MG tablet 1 tablet in the AM( total 40mg ) and 1/2  tablet (total 20mg ) in the PM  135 tablet  3  . L-Methylfolate-B6-B12 (METANX) 3-35-2 MG TABS Take 1 tablet by mouth 2 (two) times daily.        Marland Kitchen LANTUS SOLOSTAR 100 UNIT/ML injection as needed. 5 units nightly      . losartan (COZAAR) 25 MG tablet Take 1 tablet (25 mg total) by mouth daily.  90 tablet  3  . MAGNESIUM PO Take 125 mg by mouth daily.      . methadone (DOLOPHINE) 10 MG tablet Take 10 mg by mouth 2 (two) times daily.        . Multiple Vitamin (MULTIVITAMIN) capsule Take 1 capsule by mouth daily.        . nitroGLYCERIN (NITROSTAT) 0.4 MG SL tablet Place 1 tablet (0.4 mg total) under the tongue every 5 (five) minutes as needed for chest pain.  25 tablet  3  . NOVOLOG 100 UNIT/ML injection 05-15-14      . oxyCODONE-acetaminophen (PERCOCET/ROXICET) 5-325 MG per tablet as needed.      . pravastatin (PRAVACHOL) 80 MG tablet Take 1 tablet (80 mg total) by mouth every evening.  90 tablet  3  . predniSONE (DELTASONE) 10 MG tablet Take 10 mg by mouth daily.        . Selenium 200 MCG CAPS Take 1 capsule by mouth daily.        . sirolimus (RAPAMUNE) 1 MG tablet Take 1 mg by mouth every other day.        . terbinafine (LAMISIL) 250 MG tablet Take 250 mg by mouth daily.      Marland Kitchen warfarin (COUMADIN) 5 MG tablet 3 mg Mon-Wed-Fri/ 2 mg Tue-Thur- Sat-Sun       No current facility-administered medications for this visit.   BP 128/64  Pulse 72  Ht 5\' 4"  (1.626 m)  Wt 62.234 kg (137 lb 3.2 oz)  BMI 23.54 kg/m2 General: NAD Neck: JVP 7 cm, no thyromegaly or thyroid nodule.  Lungs: Clear to auscultation bilaterally with normal respiratory effort. CV: Nondisplaced PMI.  Heart regular S1/S2, no S3/S4, no murmur.  1+ edema 1/2 to knees bilaterally, R>L.    Abdomen: Soft, nontender, no hepatosplenomegaly, no distention.   Neurologic: Alert and oriented x 3.  Psych: Normal affect. Extremities: No clubbing or cyanosis.    Assessment/Plan:  Chronic kidney disease  Creatinine has been stable.  BMET today.  Coronary artery disease  NSTEMI with Promus DES to CFX in 1/13. Has residual 80% mid LAD stenosis. This  was also seen on 2007 cath, and Lexiscan myoview in 7/14 showed no ischemia. - Cynthia Hicks is on coumadin now so Plavix was stopped . - She will continue ARB, beta blocker, and statin.  HYPERLIPIDEMIA Check lipids today.   Chronic diastolic CHF EF is improved to 55-60% on most recent echo.  She looks euvolemic on exam today.  Continue current Lasix.  Will check BMET.  Atrial fibrillation She is in NSR after cardioversion in 3/14.  Continue warfarin.  She has CKD and creatinine fluctuates.  I will keep her on coumadin rather than starting a NOAC.    [name] 05/02/2013

## 2013-05-20 ENCOUNTER — Encounter: Payer: Self-pay | Admitting: Cardiology

## 2013-06-11 ENCOUNTER — Other Ambulatory Visit: Payer: Self-pay

## 2013-10-05 ENCOUNTER — Other Ambulatory Visit: Payer: Self-pay | Admitting: Cardiology

## 2013-11-02 DIAGNOSIS — M19019 Primary osteoarthritis, unspecified shoulder: Secondary | ICD-10-CM | POA: Insufficient documentation

## 2013-11-29 ENCOUNTER — Other Ambulatory Visit: Payer: Self-pay | Admitting: Cardiology

## 2013-12-10 ENCOUNTER — Other Ambulatory Visit: Payer: Self-pay | Admitting: Cardiology

## 2013-12-10 ENCOUNTER — Other Ambulatory Visit: Payer: Self-pay

## 2013-12-10 DIAGNOSIS — I2510 Atherosclerotic heart disease of native coronary artery without angina pectoris: Secondary | ICD-10-CM

## 2013-12-10 MED ORDER — LOSARTAN POTASSIUM 25 MG PO TABS: 25.0000 mg | ORAL_TABLET | Freq: Every day | ORAL | Status: DC

## 2013-12-24 ENCOUNTER — Encounter: Payer: Self-pay | Admitting: *Deleted

## 2014-01-04 ENCOUNTER — Ambulatory Visit: Payer: Medicare Other | Admitting: Cardiology

## 2014-01-13 ENCOUNTER — Other Ambulatory Visit: Payer: Self-pay | Admitting: Internal Medicine

## 2014-01-13 DIAGNOSIS — M542 Cervicalgia: Secondary | ICD-10-CM

## 2014-01-14 ENCOUNTER — Other Ambulatory Visit: Payer: Self-pay | Admitting: Cardiology

## 2014-01-15 ENCOUNTER — Other Ambulatory Visit: Payer: Self-pay | Admitting: *Deleted

## 2014-01-15 MED ORDER — PRAVASTATIN SODIUM 80 MG PO TABS: 80.0000 mg | ORAL_TABLET | Freq: Every evening | ORAL | Status: DC

## 2014-01-22 ENCOUNTER — Other Ambulatory Visit: Payer: Medicare Other

## 2014-01-24 ENCOUNTER — Other Ambulatory Visit: Payer: Self-pay | Admitting: Cardiology

## 2014-02-10 ENCOUNTER — Ambulatory Visit: Payer: Medicare Other | Admitting: Endocrinology

## 2014-02-11 ENCOUNTER — Encounter: Payer: Self-pay | Admitting: Endocrinology

## 2014-02-16 ENCOUNTER — Ambulatory Visit: Payer: Medicare Other | Admitting: Endocrinology

## 2014-02-16 ENCOUNTER — Encounter: Payer: Self-pay | Admitting: *Deleted

## 2014-02-18 NOTE — Telephone Encounter (Signed)
Close encounter 

## 2014-03-03 ENCOUNTER — Other Ambulatory Visit: Payer: Self-pay | Admitting: Endocrinology

## 2014-03-03 ENCOUNTER — Ambulatory Visit (INDEPENDENT_AMBULATORY_CARE_PROVIDER_SITE_OTHER): Payer: Medicare Other | Admitting: Endocrinology

## 2014-03-03 ENCOUNTER — Encounter: Payer: Self-pay | Admitting: Endocrinology

## 2014-03-03 ENCOUNTER — Other Ambulatory Visit: Payer: Self-pay | Admitting: *Deleted

## 2014-03-03 VITALS — BP 148/74 | HR 74 | Resp 14 | Ht 64.0 in | Wt 132.4 lb

## 2014-03-03 DIAGNOSIS — I2510 Atherosclerotic heart disease of native coronary artery without angina pectoris: Secondary | ICD-10-CM

## 2014-03-03 DIAGNOSIS — E119 Type 2 diabetes mellitus without complications: Secondary | ICD-10-CM

## 2014-03-03 DIAGNOSIS — E1142 Type 2 diabetes mellitus with diabetic polyneuropathy: Secondary | ICD-10-CM

## 2014-03-03 DIAGNOSIS — E785 Hyperlipidemia, unspecified: Secondary | ICD-10-CM

## 2014-03-03 DIAGNOSIS — E1149 Type 2 diabetes mellitus with other diabetic neurological complication: Secondary | ICD-10-CM

## 2014-03-03 LAB — GLUCOSE, POCT (MANUAL RESULT ENTRY): POC GLUCOSE: 225 mg/dL — AB (ref 70–99)

## 2014-03-03 MED ORDER — ONETOUCH DELICA LANCETS FINE MISC: Status: DC

## 2014-03-03 MED ORDER — GABAPENTIN 100 MG PO CAPS: 100.0000 mg | ORAL_CAPSULE | Freq: Three times a day (TID) | ORAL | Status: DC

## 2014-03-03 MED ORDER — GLUCOSE BLOOD VI STRP: ORAL_STRIP | Status: DC

## 2014-03-03 NOTE — Patient Instructions (Addendum)
Change Lantus to 6 units in am and if blood sugar stays over 130 no up to 7 units  Continue NovoLog 10-12 units at breakfast, take 15-18 units at lunch and 15-20 units at dinnertime.  Adjust the NovoLog up or down 2 units based on how many carbohydrates you are eating  Please check blood sugars before meals and at least once a day 2 hours after any meal and every 2 days on waking up. You do not have to check it in the morning daily   Please bring blood sugar monitor to each visit  May stop Metanx when finished  Gabapentin 100 mg with food 2-3 times a day and may go up to 2 capsules at a time if needed for tingling and pain

## 2014-03-03 NOTE — Progress Notes (Signed)
Patient ID: Cynthia Hicks, female   DOB: 1933-07-16, 80 y.o.   MRN: [id number]               Reason for Appointment: Consultation for Type 2 Diabetes  Referring physician: Jannette Fogo  History of Present Illness:          Diagnosis: Type 2 diabetes mellitus, date of diagnosis: 1992      Past history: She thinks she was diagnosed to have diabetes when she was started on prednisone for her autoimmune hepatitis At that time she had increased thirst and blurred vision. She was tried on Glucophage which caused abdominal distress and this was stopped She has been on insulin for about 23 years no  Recent history:  She thinks she was started on premixed mealtime insulin initially and subsequently started on rapid acting mealtime insulin only Also about a year ago started on Lantus in addition She takes her set dose of NovoLog before each meal and more if the blood sugar is higher Did not bring her blood sugar monitor or any records for review today Apparently she is checking her blood sugar about 3 times a day at various times She is concerned that her blood sugars are not controlled. Her A1c earlier this year was 7.5 Recently from her local hospital A1c result was 6.1% on 02/20/14 She thinks by cutting back on snacks in between meals her sugars may be a little better She believes her sugars could be improved and would like to to be near normal       Oral hypoglycemic drugs the patient is taking are: None      Side effects from medications have been: INSULIN regimen is described as: Lantus  5 units at bedtime. NovoLog before meals: 05-15-14 units Compliance with the medical regimen: Good Hypoglycemia: None    Glucose monitoring:  done 3 times a day         Glucometer:  True touch       Blood Glucose readings by time of day and averages from meter download:  PREMEAL Breakfast Lunch Dinner Bedtime  Overall   Glucose range: 123-139 115 180 120-150   Median:     ?    Glycemic control: A1c  in 7/15: 6.1   Lab Results  Component Value Date   HGBA1C  Value: 6.9 (NOTE) The ADA recommends the following therapeutic goal for glycemic control related to Hgb A1c measurement: Goal of therapy: <6.5 Hgb A1c  Reference: American Diabetes Association: Clinical Practice Recommendations 2010, Diabetes Care, 2010, 33: (Suppl  1).* 07/21/2009   Lab Results  Component Value Date   MICROALBUR >600.00 mg/dL* 07/05/2009   LDLCALC 192 08/04/2009   CREATININE 1.7* 01/01/2013    Retinal exam: Most recent:.   Self-care: The diet that the patient has been following is: tries to limit fat 1200-1500 Calories.      Meals: 3 meals per day. Breakfast is           Exercise: not able to         Dietician visit: Most recent:.               Weight history: Previous range 129-148  Wt Readings from Last 3 Encounters:  03/03/14 132 lb 6.4 oz (60.056 kg)  04/30/13 137 lb 3.2 oz (62.234 kg)  02/05/13 140 lb (63.504 kg)       Medication List       This list is accurate as of: 03/03/14  4:26 PM.  Always use your most recent med list.               amoxicillin-clavulanate 875-125 MG per tablet  Commonly known as:  AUGMENTIN  Take 1 tablet by mouth 2 (two) times daily.     bisoprolol 5 MG tablet  Commonly known as:  ZEBETA  TAKE 1 TABLET BY MOUTH EVERY DAY     calcium carbonate 200 MG capsule  Take 1,000 mg by mouth 2 (two) times daily with a meal.     Chromium 500 MCG Tabs  Take 1 tablet by mouth daily.     ezetimibe 10 MG tablet  Commonly known as:  ZETIA  Take 10 mg by mouth daily.     fish oil-omega-3 fatty acids 1000 MG capsule  Take 1 g by mouth 2 (two) times daily.     furosemide 40 MG tablet  Commonly known as:  LASIX  TAKE 1 TABLET BY MOUTH EVERY MORNING AND TAKE 1/2 TABLET EVERY EVENING     LANTUS SOLOSTAR 100 UNIT/ML Solostar Pen  Generic drug:  Insulin Glargine  as needed. 5 units nightly     losartan 25 MG tablet  Commonly known as:  COZAAR  TAKE 1 TABLET BY MOUTH  EVERY DAY     MAGNESIUM PO  Take 125 mg by mouth daily.     methadone 10 MG tablet  Commonly known as:  DOLOPHINE  Take 10 mg by mouth 2 (two) times daily.     multivitamin 3-35-2 MG Tabs tablet  Take 1 tablet by mouth 2 (two) times daily.     multivitamin capsule  Take 1 capsule by mouth daily.     nitroGLYCERIN 0.4 MG SL tablet  Commonly known as:  NITROSTAT  Place 1 tablet (0.4 mg total) under the tongue every 5 (five) minutes as needed for chest pain.     NOVOLOG 100 UNIT/ML injection  Generic drug:  insulin aspart  07-18-19     oxyCODONE-acetaminophen 5-325 MG per tablet  Commonly known as:  PERCOCET/ROXICET  as needed.     pravastatin 80 MG tablet  Commonly known as:  PRAVACHOL  Take 1 tablet (80 mg total) by mouth every evening.     predniSONE 10 MG tablet  Commonly known as:  DELTASONE  Take 10 mg by mouth daily.     PROBIOTIC DAILY PO  Take by mouth.     RAPAMUNE 1 MG tablet  Generic drug:  sirolimus  Take 1 mg by mouth every other day.     Selenium 200 MCG Caps  Take 1 capsule by mouth daily.     vitamin C 1000 MG tablet  Take 1,000 mg by mouth 2 (two) times daily.     VITAMIN D PO  Take 5,000 Units by mouth daily.     warfarin 5 MG tablet  Commonly known as:  COUMADIN  3 mg Mon-Wed-Fri/ 2 mg Tue-Thur- Sat-Sun        Allergies:  Allergies  Allergen Reactions  . Cephalosporins     Blasted bone marrow  . Azathioprine     Other reaction(s): Unknown  . Other     Other reaction(s): Other (See Comments) Uncoded Allergy. Allergen: Other Allergy: See Patient Chart for Details, Other Reaction: Toxicity    Past Medical History  Diagnosis Date  . Rapid heart rate 11/04/2010  . Hypertension   . Arthritis   . Diabetes mellitus   . Coronary artery disease 2007    A.  MI  01/2003;  B.  02/2006 NSTEMI - LAD 80-67m, LCX nl, RCA 40m - RCA stented w/ Taxus DES;   C.  2009 Neg MV;   D. 11/2010 Echo - EF 65%;  E. 08/2011 - nstemi, MV @ [facility] showed  inflat isch and EF 37%  . Autoimmune liver disease     A.  Followed @ [facility] - on chronic prednisone and  sirolimus  . Hyperlipidemia     inability to stake statins due to liver disease  . Cellulitis     chronic cellulitis of LE  . Chronic kidney disease   . Bulging disc   . Sciatica   . Chronic back pain     A.  On Methadone  . Skin cancer (melanoma)   . Esophageal stricture     narrowing  . Irregular heart beat   . Myocardial infarction 2007 and  Jan. 2012  . Diabetic neuropathy   . Autoimmune liver disease   . Sepsis     Past Surgical History  Procedure Laterality Date  . Cardiac catheterization      Normal EF  . Coronary angioplasty  2007    2 DES placement to RCA complicated by a wire perforation  . Back surgery  1992  . Tubal ligation  1982  . Elbow surgery  1994    neuroma removed left  . Abscess drainage      Right side  . Spine surgery    . Lamenectomy  1989  . Heart stents  2007  and  2012    X's 1  and  2012 X's 2 stents  . Cardioversion N/A 10/31/2012    Procedure: CARDIOVERSION;  Surgeon: Larey Dresser, MD;  Location: [facility] ENDOSCOPY;  Service: Cardiovascular;  Laterality: N/A;    Family History  Problem Relation Age of Onset  . Lung cancer Mother 58  . Heart attack Father 42  . Heart disease Father   . Congestive Heart Failure Father   . Heart disease Maternal Grandfather   . Hypertension Brother   . Hypertension Son   . Pancreatic cancer Mother     Social History:  reports that she has never smoked. She has never used smokeless tobacco. She reports that she does not drink alcohol or use illicit drugs.    Review of Systems       Lipids: She has been treated with pravastatin 80 mg, no recent lipid levels available       Lab Results  Component Value Date   CHOL 202* 05/27/2012   HDL 87.10 05/27/2012   LDLCALC 192 08/04/2009   LDLDIRECT 99.8 05/27/2012   TRIG 134.0 05/27/2012   CHOLHDL 2 05/27/2012                  Skin: No rash. She did  have a significant amount of cellulitis on her right leg a few weeks ago which is resolved     Thyroid:  She tends to get tired. No known history of thyroid disease     The blood pressure has been treated with bisoprolol and low-dose losartan       Has history of swelling of feet. Also has history of diastolic CHF      No shortness of breath or chest tightness  on exertion.       She has long-standing history of atrial fibrillation and is on Coumadin. Apparently her INR today was 8.7 she has had some increased bruising     Bowel habits:  Normal.     She is still on prednisone for autoimmune hepatitis and has been on 10 mg for the last 10 years or so       No frequency of urination or nocturia       No joint  pains. She has had long-standing severe low back pain treated with Methadone     She was told she does not have osteoporosis as of about 2 years ago. Has shrunk 2 inches which she thinks is from her lumbar disc disease          She does have a history of Numbness, tingling but no burning in feet  On Metanx for the last 3 years without any relief from her pain management physician but has never taken other medications for the symptoms.   LABS:  Creatinine in 6/15 was 1.18  Office Visit on 03/03/2014  Component Date Value Ref Range Status  . POC Glucose 03/03/2014 225* 70 - 99 mg/dl Final     Physical Examination:  BP 148/74  Pulse 74  Resp 14  Ht 5\' 4"  (1.626 m)  Wt 132 lb 6.4 oz (60.056 kg)  BMI 22.72 kg/m2  SpO2 96%  GENERAL:     she is averagely built and nourished, pleasant and alert HEENT:         Eye exam shows normal external appearance. Fundus exam shows no retinopathy. Oral exam shows normal mucosa .  NECK:         General:  Neck exam shows no lymphadenopathy. Carotids are normal to palpation and no bruit heard.  Thyroid is not enlarged and no nodules felt.   LUNGS:         Chest is symmetrical. Lungs are clear to auscultation.Marland Kitchen   HEART:         Heart  sounds:  S1 and S2 are normal. No murmurs or clicks heard., no S3 or S4.   ABDOMEN:   There is no distention present. Liver and spleen are not palpable. No other mass or tenderness present.  EXTREMITIES:     There is 1+ pedal edema. No skin lesions present.Marland Kitchen  NEUROLOGICAL:   Ankle jerks are absent bilaterally.          Diabetic foot exam: Absent monofilament and vibration sensation in the toes. Also cannot feel the monofilament in the lower legs Pedal pulses not palpable MUSCULOSKELETAL:       There is no enlargement or deformity of the joints. Spine is normal to inspection.Marland Kitchen   SKIN:       No rash or lesions of concern.        ASSESSMENT:  Diabetes type 2 on insulin She has fairly good control of her diabetes considering her age and long duration of diabetes as well as multiple comorbid conditions However her A1c may be falsely low because of either anemia or renal insufficiency She tends to have postprandial hyperglycemia especially in the afternoons including today This is likely to be related to her taking the prednisone 10 mg in the mornings and this was explained to the patient Currently only on low doses of bedtime basal insulin with fair control of her fasting readings  Complications: Peripheral neuropathy with sensory loss, not clear if she has had a urine microalbumin done  History of hyperlipidemia: No recent lipids available, followed by other physicians  Multiple other medical problems including autoimmune hepatitis, severe low back pain, history of atrial fibrillation, anemia, leg edema and CHF  PLAN:  Since she  is on prednisone she is better off taking her basal insulin in the morning and using a relatively higher dose. She will try 6 units in the morning and go up a unit at a time to get morning sugars under 120 She will increase her lunchtime coverage by 3-5 units May not need as much coverage at dinner time since presupper blood sugars should be better She will continue  taking 10-12 units at breakfast of her NovoLog She may be a candidate for another drug to help her hyperglycemia such as Victoza or Invokana but would like to keep her treatment regimen simple for now She was given the brand name glucose monitor and instructed on the Verio today. Discussed timing and frequency of glucose monitoring and to bring the monitor for download on each visit She will need to have balanced meals with protein consistently The fructosamine to be checked on next visit to assess level of control Will need to assess urine microalbumin Discussed precautions regarding foot care since she has sensory loss  Total visit time including counseling = 60 minutes  Cynthia Hicks 03/03/2014, 4:26 PM   Note: This office note was prepared with Dragon voice recognition system technology. Any transcriptional errors that result from this process are unintentional.

## 2014-03-04 DIAGNOSIS — E1142 Type 2 diabetes mellitus with diabetic polyneuropathy: Secondary | ICD-10-CM | POA: Insufficient documentation

## 2014-03-08 ENCOUNTER — Other Ambulatory Visit: Payer: Self-pay | Admitting: Cardiology

## 2014-03-09 ENCOUNTER — Telehealth: Payer: Self-pay | Admitting: Endocrinology

## 2014-03-09 NOTE — Telephone Encounter (Signed)
Pt was given the one touch monitor and the test strips are not covered. Pt's insurnace does cover trutrac monitor and strips

## 2014-03-10 ENCOUNTER — Other Ambulatory Visit: Payer: Self-pay | Admitting: *Deleted

## 2014-03-10 MED ORDER — ACCU-CHEK AVIVA PLUS W/DEVICE KIT: PACK | Status: DC

## 2014-03-10 MED ORDER — GLUCOSE BLOOD VI STRP: ORAL_STRIP | Status: DC

## 2014-03-10 NOTE — Telephone Encounter (Signed)
Called and spoke to pharmacist who said Accu-chek aviva plus is covered, rx sent for that and test strips.  Patient aware

## 2014-03-25 ENCOUNTER — Other Ambulatory Visit: Payer: Self-pay | Admitting: Cardiology

## 2014-03-31 ENCOUNTER — Ambulatory Visit: Payer: Medicare Other | Admitting: Endocrinology

## 2014-03-31 ENCOUNTER — Other Ambulatory Visit: Payer: Medicare Other

## 2014-04-07 ENCOUNTER — Other Ambulatory Visit: Payer: Self-pay | Admitting: Anesthesiology

## 2014-04-07 DIAGNOSIS — M545 Low back pain, unspecified: Secondary | ICD-10-CM

## 2014-04-08 ENCOUNTER — Other Ambulatory Visit: Payer: Self-pay | Admitting: *Deleted

## 2014-04-08 ENCOUNTER — Encounter: Payer: Self-pay | Admitting: *Deleted

## 2014-04-09 ENCOUNTER — Telehealth: Payer: Self-pay | Admitting: Cardiology

## 2014-04-09 ENCOUNTER — Encounter: Payer: Self-pay | Admitting: Cardiology

## 2014-04-09 ENCOUNTER — Ambulatory Visit (INDEPENDENT_AMBULATORY_CARE_PROVIDER_SITE_OTHER): Payer: Medicare Other | Admitting: Cardiology

## 2014-04-09 VITALS — BP 104/50 | HR 93 | Ht 64.0 in | Wt 132.0 lb

## 2014-04-09 DIAGNOSIS — N182 Chronic kidney disease, stage 2 (mild): Secondary | ICD-10-CM

## 2014-04-09 DIAGNOSIS — I509 Heart failure, unspecified: Secondary | ICD-10-CM

## 2014-04-09 DIAGNOSIS — I4891 Unspecified atrial fibrillation: Secondary | ICD-10-CM

## 2014-04-09 DIAGNOSIS — I5032 Chronic diastolic (congestive) heart failure: Secondary | ICD-10-CM

## 2014-04-09 DIAGNOSIS — E785 Hyperlipidemia, unspecified: Secondary | ICD-10-CM

## 2014-04-09 DIAGNOSIS — R0602 Shortness of breath: Secondary | ICD-10-CM

## 2014-04-09 DIAGNOSIS — I2510 Atherosclerotic heart disease of native coronary artery without angina pectoris: Secondary | ICD-10-CM

## 2014-04-09 DIAGNOSIS — I480 Paroxysmal atrial fibrillation: Secondary | ICD-10-CM

## 2014-04-09 MED ORDER — BISOPROLOL FUMARATE 5 MG PO TABS: ORAL_TABLET | ORAL | Status: DC

## 2014-04-09 MED ORDER — FUROSEMIDE 40 MG PO TABS: 40.0000 mg | ORAL_TABLET | Freq: Two times a day (BID) | ORAL | Status: DC

## 2014-04-09 MED ORDER — PRAVASTATIN SODIUM 80 MG PO TABS: 80.0000 mg | ORAL_TABLET | Freq: Every evening | ORAL | Status: DC

## 2014-04-09 MED ORDER — LOSARTAN POTASSIUM 25 MG PO TABS: ORAL_TABLET | ORAL | Status: DC

## 2014-04-09 MED ORDER — APIXABAN 5 MG PO TABS: 5.0000 mg | ORAL_TABLET | Freq: Two times a day (BID) | ORAL | Status: DC

## 2014-04-09 NOTE — Telephone Encounter (Signed)
Per Dr. Aundra Dubin pt should be aware. I called pts son and went over instructions with him. He will make sure his mother is aware. He will have her call on Tuesday 04/13/14 if she has questions.

## 2014-04-09 NOTE — Patient Instructions (Addendum)
Your physician has recommended you make the following change in your medication:   1. Increase Lasix to 40 mg 1 tablet twice a day.   Your physician recommends that you return for lab work in: 10 days. Have your home health nurse draw a Bmet,CBC and BNP. Please have results faxed to Dr. Aundra Dubin at [phone] [facility].   Your physician recommends that you schedule a follow-up appointment in: 3 weeks with Dr. Aundra Dubin.   Stop Coumadin tomorrow (no coumadin tomorrow). Have Home Health check INR on Tuesday. When INR is less than 2 start Eliquis 5 mg 1 tablet twice a day.

## 2014-04-09 NOTE — Telephone Encounter (Addendum)
Stop Coumadin tomorrow (no coumadin tomorrow 04/10/14). Have Home Health check INR on Tuesday 04/13/14. When INR is less than 2 start Eliquis 5 mg 1 tablet twice a day.   lmom with instructions x2 and to call. I will call on Monday to make sure pt understands.

## 2014-04-11 NOTE — Progress Notes (Signed)
Patient ID: Cynthia Hicks, female   DOB: 05/30/1933, 80 y.o.   MRN: [id number] PCP: Dr. Celedonio Miyamoto  80 yo with history of CAD and autoimmune hepatitis presents for cardiology followup.  She was admitted in 1/13 with NSTEMI.  Left heart cath showed 80% mid LAD stenosis and 99% mid CFX stenosis (likely culprit).  EF was 40-45% by echo and LV-gram.  She had a PROMUS DES to the CFX.  No intervention to the LAD.   Lexiscan myoview done subsequently in 2/13 showed EF 49% with no ischemia.  Echo 4/13 showed EF around 45-50% with basal to mid inferior and mid-apical posterior akinesis along with mild MR.  She was admitted in 12/13 at [facility] with severe cellulitis and sepsis syndrome.  She was seen here by Truitt Merle later in 12/13 and found to be in atrial fibrillation.  Coumadin was started and is followed by her PCP. In 3/14, I cardioverted her to NSR.  In 7/14, she had an echo showing EF 55-60% with basal inferior hypokinesis and a Lexiscan Cardiolite that was low risk with no ischemia.   Since I last saw her, she has been in the hospital twice in [place] with severe cellulitis (5/15 and 7/15).  She has chronic R>L leg swelling.  Ultrasound evaluation at hospital in [place] was negative for DVT.  She has had difficulty controlling her INR on coumadin.  She continues to have severe low back pain which limits her ambulation.  She is back in atrial fibrillation today though she did not realize it.  She is not sure how long she has been in it, though she noted one day last week that her HR on her monitor was up to 107.  She is short of breath with moderate activity.   ECG: atrial fibrillation at 93 with lateral TWIs  Labs (4/13): LDL 65, HDL 101, K 3.7, creatinine 1.46, AST 26, ALT 38 Labs (5/13): K 4.4, creatinine 1.54 Labs (10/13): LDL 100, HDL 87 Labs (1/14): K 4.3, creatinine 1.76 Labs (2/14): LDL 65, HDL 109, TGs 111 Labs (4/14): creatinine 1.4 => 1.47, K 4.1, BNP 381 Labs (5/14): K 4.6, creatinine  1.7 Labs (10/14): LDL 68, HDL 80 Labs (6/15): K 4.1, creatinine 1.18  PMH: 1. CAD: NSTEMI 1/07.  LHC showed 80-90% mid LAD stenosis and 95% mid RCA stenosis.  She had Taxus DES x 2 (overlapping) to the RCA.  Procedure was complicated by wire perforation of PLV, PLV was coil embolized.  Myoview 2009 showed no ischemia or infarction.  NSTEMI 1/13.  LHC with 80% mid LAD (same as 2007), 99% mid CFX, 70% distal RCA.  PROMUS DES to CFX.  Lexiscan myoview (2/13, to assess LAD territory): EF 49%, no ischemia.  Lexiscan Cardiolite (7/14) with EF 48%, no ischemia.  2. Hyperlipidemia: Unable to take Crestor or Lipitor due to leg weakness.  3. Autoimmune hepatitis: Followed at [facility].  On prednisone and sirolimus.   4. Lumbar disc disease/sciatica.  5. CKD 6. Chronic venous insufficiency with chronic R>L lower leg swelling.  7. Paroxysmal atrial fibrillation: Only documented episode was in 7/07.  8. SVT: suppressed by beta blocker.  9. Ischemic cardiomyopathy: Echo (1/13) with EF 40-45%.  EF 40% by LV-gram (1/13).  Echo (4/13): EF 45-50% with basal to mid inferior and mid to apical posterior akinesis and moderate MR.  Echo (7/14) with EF 55-60%, basal inferior HK, mild LVH.  10. Carotid dopplers (12/12) with minimal disease.  11. Cholelithiasis/choledocholithiasis s/p ERCP and sphincterotomy  in 2013.  12. Atrial fibrillation: First noted in 12/13.  DCCV 3/14. Back in atrial fibrillation in 9/15.  13. Frozen shoulder on right.   SH: Lives in [place], widowed, retired Engineer, site.  Never smoked.    FH: No premature CAD.    ROS: All systems reviewed and negative except as per HPI.   Current Outpatient Prescriptions  Medication Sig Dispense Refill  . Ascorbic Acid (VITAMIN C) 1000 MG tablet Take 1,000 mg by mouth 2 (two) times daily.       . bisoprolol (ZEBETA) 5 MG tablet TAKE 1 TABLET BY MOUTH EVERY DAY  90 tablet  1  . Blood Glucose Monitoring Suppl (ACCU-CHEK AVIVA PLUS) W/DEVICE KIT Use to  check blood sugar 3 times per day dx code 250.00  1 kit  0  . calcium carbonate 200 MG capsule Take 1,000 mg by mouth 2 (two) times daily with a meal.       . Cholecalciferol (VITAMIN D PO) Take 5,000 Units by mouth daily.        . Chromium 500 MCG TABS Take 1 tablet by mouth daily.        Marland Kitchen ezetimibe (ZETIA) 10 MG tablet Take 10 mg by mouth daily.        . fish oil-omega-3 fatty acids 1000 MG capsule Take 1 g by mouth 2 (two) times daily.       . furosemide (LASIX) 40 MG tablet Take 1 tablet (40 mg total) by mouth 2 (two) times daily.  180 tablet  1  . gabapentin (NEURONTIN) 100 MG capsule Take 1 capsule (100 mg total) by mouth 3 (three) times daily.  90 capsule  3  . glucose blood (ACCU-CHEK AVIVA PLUS) test strip Use as instructed to check blood sugar 3 times per day dx code 250.00  100 each  3  . LANTUS SOLOSTAR 100 UNIT/ML injection as needed. 5 units nightly      . losartan (COZAAR) 25 MG tablet TAKE 1 TABLET BY MOUTH EVERY DAY.  90 tablet  1  . MAGNESIUM PO Take 125 mg by mouth daily.      . methadone (DOLOPHINE) 10 MG tablet Take 10 mg by mouth 2 (two) times daily.        . Multiple Vitamin (MULTIVITAMIN) capsule Take 1 capsule by mouth daily.        . nitroGLYCERIN (NITROSTAT) 0.4 MG SL tablet Place 1 tablet (0.4 mg total) under the tongue every 5 (five) minutes as needed for chest pain.  25 tablet  3  . NOVOLOG 100 UNIT/ML injection 07-18-19      . ONETOUCH DELICA LANCETS FINE MISC Use to check blood sugar 3 times per day dx code 250.00  100 each  3  . oxyCODONE-acetaminophen (PERCOCET/ROXICET) 5-325 MG per tablet as needed.      . pravastatin (PRAVACHOL) 80 MG tablet Take 1 tablet (80 mg total) by mouth every evening.  90 tablet  1  . predniSONE (DELTASONE) 10 MG tablet Take 10 mg by mouth daily.        . Probiotic Product (PROBIOTIC DAILY PO) Take by mouth.      . Selenium 200 MCG CAPS Take 1 capsule by mouth daily.        . sirolimus (RAPAMUNE) 1 MG tablet Take 1 mg by mouth every  other day.        Marland Kitchen apixaban (ELIQUIS) 5 MG TABS tablet Take 1 tablet (5 mg total) by mouth 2 (two)  times daily.  180 tablet  3   No current facility-administered medications for this visit.   BP 104/50  Pulse 93  Ht _0  (1.626 m)  Wt 132 lb (59.875 kg)  BMI 22.65 kg/m2 General: NAD Neck: JVP 8 cm, no thyromegaly or thyroid nodule.  Lungs: Clear to auscultation bilaterally with normal respiratory effort. CV: Nondisplaced PMI.  Heart regular S1/S2, no S3/S4, no murmur.  1+ edema to knees bilaterally, R>L (chronic).    Abdomen: Soft, nontender, no hepatosplenomegaly, no distention.   Neurologic: Alert and oriented x 3.  Psych: Normal affect. Extremities: No clubbing or cyanosis.   Assessment/Plan:  Chronic kidney disease  Creatinine has been stable recently.  Will get BMET in 10 days after increasing Lasix.   Coronary artery disease  NSTEMI with Promus DES to CFX in 1/13. Has residual 80% mid LAD stenosis. This was also seen on 2007 cath, and Lexiscan myoview in 7/14 showed no ischemia.  No chest pain.  - She is anticoagulated with stable CAD, so not on ASA.  - She will continue ARB, beta blocker, and statin.  HYPERLIPIDEMIA Good lipids when last checked.  Chronic diastolic CHF EF was improved to 55-60% on most recent echo in 7/14.  She looks volume overloaded today.  This may be related to going back into atrial fibrillation.  - Increase Lasix to 40 mg bid.  - BMET/BNP in 10 days.  Atrial fibrillation She is back in atrial fibrillation today.  INR has been hard to control on warfarin.  Therefore, I will switch her over to Eliquis 5 mg bid.  She will stop warfarin and start Eliquis when INR < 2.  After 4 weeks of Eliquis, I will arrange for DCCV if she remains in atrial fibrillation.    [name] 04/11/2014

## 2014-04-13 NOTE — Telephone Encounter (Signed)
Gave patient phone numbers for Eliquis and Xarelto, to check about any assistance either may be able to provide (secondary to unaffordable cost to patient).  She will call back this afternoon with INR result and findings from medication inquiries. I explained that we would not be able to wait until 9/14 to draw BMET, if decides to start Xarelto. Patient verbalized understanding and will call back this afternoon.

## 2014-04-13 NOTE — Addendum Note (Signed)
Addended byUlla Potash H on: 04/13/2014 10:26 AM   Modules accepted: Orders

## 2014-04-13 NOTE — Telephone Encounter (Signed)
lmtrc

## 2014-04-13 NOTE — Telephone Encounter (Signed)
GFR has been low (< 50) in the past.  Would have her start Xarelto 15 mg daily (will need BMET done to confirm GFR). See if she can afford Xarelto.

## 2014-04-13 NOTE — Telephone Encounter (Addendum)
Spoke with pt and she will have her INR checked at 1pm today. Pt states she cant afford Eliquis because it will cost her $130.00 a month. Pt thinks she took her coumadin on Saturday ( not sure if she did or not). She hasnt taken coumadin since her last dose on Saturday (if she took it). Please advise.   Pt wants to have lab work drawn here in our office on 04/19/14. Labs ordered and scheduled.

## 2014-04-14 ENCOUNTER — Telehealth: Payer: Self-pay | Admitting: Cardiology

## 2014-04-14 NOTE — Telephone Encounter (Signed)
New message     Returning Anne's call

## 2014-04-14 NOTE — Telephone Encounter (Signed)
LMTCB

## 2014-04-14 NOTE — Telephone Encounter (Signed)
Spoke with patient about medications. 

## 2014-04-14 NOTE — Telephone Encounter (Signed)
Follow up ° ° ° ° ° °Returned Anne's call °

## 2014-04-14 NOTE — Telephone Encounter (Signed)
Pt states she is off warfarin (INR 1.4 yesterday) and started Eliquis 5mg  bid yesterday.

## 2014-04-14 NOTE — Telephone Encounter (Signed)
LMTCB for pt.  Spoke with pt's son, Wilfred Lacy. Wilfred Lacy requested that I speak with patient about medication options and lab.

## 2014-04-19 ENCOUNTER — Ambulatory Visit
Admission: RE | Admit: 2014-04-19 | Discharge: 2014-04-19 | Disposition: A | Discharge status: Home / Self Care | Payer: Medicare Other | Source: Ambulatory Visit | Attending: Anesthesiology | Admitting: Anesthesiology

## 2014-04-19 ENCOUNTER — Other Ambulatory Visit (INDEPENDENT_AMBULATORY_CARE_PROVIDER_SITE_OTHER): Payer: Medicare Other

## 2014-04-19 DIAGNOSIS — I509 Heart failure, unspecified: Secondary | ICD-10-CM

## 2014-04-19 DIAGNOSIS — M545 Low back pain, unspecified: Secondary | ICD-10-CM

## 2014-04-19 DIAGNOSIS — I5032 Chronic diastolic (congestive) heart failure: Secondary | ICD-10-CM

## 2014-04-19 DIAGNOSIS — R0602 Shortness of breath: Secondary | ICD-10-CM

## 2014-04-19 DIAGNOSIS — I4891 Unspecified atrial fibrillation: Secondary | ICD-10-CM

## 2014-04-19 DIAGNOSIS — I480 Paroxysmal atrial fibrillation: Secondary | ICD-10-CM

## 2014-04-19 LAB — CBC
HEMATOCRIT: 35.2 % — AB (ref 36.0–46.0)
Hemoglobin: 11.3 g/dL — ABNORMAL LOW (ref 12.0–15.0)
MCHC: 32.1 g/dL (ref 30.0–36.0)
MCV: 95.5 fl (ref 78.0–100.0)
Platelets: 250 10*3/uL (ref 150.0–400.0)
RBC: 3.69 Mil/uL — ABNORMAL LOW (ref 3.87–5.11)
RDW: 19.5 % — ABNORMAL HIGH (ref 11.5–15.5)
WBC: 13.1 10*3/uL — ABNORMAL HIGH (ref 4.0–10.5)

## 2014-04-19 LAB — BASIC METABOLIC PANEL
BUN: 42 mg/dL — AB (ref 6–23)
CALCIUM: 8.9 mg/dL (ref 8.4–10.5)
CO2: 25 mEq/L (ref 19–32)
Chloride: 96 mEq/L (ref 96–112)
Creatinine, Ser: 1.9 mg/dL — ABNORMAL HIGH (ref 0.4–1.2)
GFR: 27.29 mL/min — AB (ref >60.00)
Glucose, Bld: 136 mg/dL — ABNORMAL HIGH (ref 70–99)
Potassium: 4.3 mEq/L (ref 3.5–5.1)
SODIUM: 132 meq/L — AB (ref 135–145)

## 2014-04-19 LAB — BRAIN NATRIURETIC PEPTIDE: Pro B Natriuretic peptide (BNP): 445 pg/mL — ABNORMAL HIGH (ref 0.0–100.0)

## 2014-04-20 ENCOUNTER — Other Ambulatory Visit: Payer: Self-pay | Admitting: *Deleted

## 2014-04-20 DIAGNOSIS — I480 Paroxysmal atrial fibrillation: Secondary | ICD-10-CM

## 2014-04-20 DIAGNOSIS — I5032 Chronic diastolic (congestive) heart failure: Secondary | ICD-10-CM

## 2014-04-30 ENCOUNTER — Ambulatory Visit: Payer: Medicare Other | Admitting: Endocrinology

## 2014-04-30 ENCOUNTER — Other Ambulatory Visit: Payer: Medicare Other

## 2014-05-04 ENCOUNTER — Encounter: Payer: Self-pay | Admitting: *Deleted

## 2014-05-04 ENCOUNTER — Other Ambulatory Visit: Payer: Self-pay | Admitting: *Deleted

## 2014-05-07 ENCOUNTER — Other Ambulatory Visit: Payer: Medicare Other

## 2014-05-07 ENCOUNTER — Ambulatory Visit: Payer: Medicare Other | Admitting: Cardiology

## 2014-05-21 ENCOUNTER — Telehealth: Payer: Self-pay | Admitting: Cardiology

## 2014-05-21 NOTE — Telephone Encounter (Signed)
Dr Aundra Dubin recommended pt continue to monitor heart rate and BP, schedule an appointment for follow up with him next week.    Pt advised, verbalized understanding, appt scheduled.

## 2014-05-21 NOTE — Telephone Encounter (Signed)
New message      Patient C/O back in Afib . Heart rate very irregular . 60-100. Notice by physical therapy.    Vital sign 99 % , 64, up , 20, 112/70. Sitting

## 2014-05-21 NOTE — Telephone Encounter (Signed)
05/21/14--pt states she did not have PT this morning because she just didn't feel good.  Pt's BP 112/58- heart rate  85, seems pretty steady right now.  Pt states she is feeling better. Pt did confirm that she is taking her Eliquis regularly and has not missed any doses.

## 2014-05-25 ENCOUNTER — Other Ambulatory Visit: Payer: Self-pay | Admitting: *Deleted

## 2014-05-25 ENCOUNTER — Telehealth: Payer: Self-pay | Admitting: *Deleted

## 2014-05-25 MED ORDER — APIXABAN 5 MG PO TABS: 5.0000 mg | ORAL_TABLET | Freq: Two times a day (BID) | ORAL | Status: DC

## 2014-05-25 NOTE — Telephone Encounter (Signed)
Patient Assistance Forms for Eliquis completed by Dr Aundra Dubin.  To HIM to be faxed to [employer].  LMTCB for pt to let her know forms completed.

## 2014-05-27 ENCOUNTER — Ambulatory Visit: Payer: Medicare Other | Admitting: Cardiology

## 2014-06-03 NOTE — Telephone Encounter (Signed)
Pt advised forms completed and faxed to [employer].

## 2014-06-04 ENCOUNTER — Other Ambulatory Visit: Payer: Self-pay | Admitting: *Deleted

## 2014-06-04 DIAGNOSIS — I83019 Varicose veins of right lower extremity with ulcer of unspecified site: Secondary | ICD-10-CM

## 2014-06-04 DIAGNOSIS — I83893 Varicose veins of bilateral lower extremities with other complications: Secondary | ICD-10-CM

## 2014-06-04 DIAGNOSIS — M79604 Pain in right leg: Secondary | ICD-10-CM

## 2014-06-04 DIAGNOSIS — L97919 Non-pressure chronic ulcer of unspecified part of right lower leg with unspecified severity: Secondary | ICD-10-CM

## 2014-06-07 ENCOUNTER — Ambulatory Visit (INDEPENDENT_AMBULATORY_CARE_PROVIDER_SITE_OTHER): Payer: Medicare Other | Admitting: Endocrinology

## 2014-06-07 ENCOUNTER — Encounter: Payer: Self-pay | Admitting: Endocrinology

## 2014-06-07 VITALS — BP 102/50 | HR 80 | Temp 97.8°F | Resp 14 | Ht 64.0 in | Wt 129.0 lb

## 2014-06-07 DIAGNOSIS — E1149 Type 2 diabetes mellitus with other diabetic neurological complication: Secondary | ICD-10-CM

## 2014-06-07 DIAGNOSIS — IMO0002 Reserved for concepts with insufficient information to code with codable children: Secondary | ICD-10-CM

## 2014-06-07 DIAGNOSIS — E1165 Type 2 diabetes mellitus with hyperglycemia: Secondary | ICD-10-CM

## 2014-06-07 DIAGNOSIS — I2510 Atherosclerotic heart disease of native coronary artery without angina pectoris: Secondary | ICD-10-CM

## 2014-06-07 DIAGNOSIS — E1140 Type 2 diabetes mellitus with diabetic neuropathy, unspecified: Secondary | ICD-10-CM

## 2014-06-07 MED ORDER — INSULIN REGULAR HUMAN 100 UNIT/ML IJ SOLN: 25.0000 [IU] | Freq: Three times a day (TID) | INTRAMUSCULAR | Status: DC

## 2014-06-07 NOTE — Progress Notes (Signed)
Patient ID: Cynthia Hicks, female   DOB: Sep 22, 1932, 80 y.o.   MRN: [id number]               Reason for Appointment:  Follow-up for Type 2 Diabetes  Referring physician: Ardelle Park  History of Present Illness:          Diagnosis: Type 2 diabetes mellitus, date of diagnosis: 1992      Past history: She thinks she was diagnosed to have diabetes when she was started on prednisone for her autoimmune hepatitis At that time she had increased thirst and blurred vision. She was tried on Glucophage which caused abdominal distress and this was stopped She has been on insulin for about 23 years  She thinks she was started on premixed mealtime insulin initially and subsequently started on rapid acting mealtime insulin only In 2014 was started on Lantus in addition  Recent history:  She has not been seen in follow-up since 02/2014 At that time she was advised to take higher dose of NovoLog insulin at lunchtime because of higher blood sugars at suppertime and using prednisone She was also asked to switch her low-dose Lantus to morning to help with daytime hyperglycemia  However since her last visit she has had intercurrent illnesses and hospitalizations Appears to be taking significantly more insulin now She stopped taking her Lantus recently because of the high out-of-pocket expense She thinks she is taking her NovoLog based on the pre-meal blood sugar Since her blood sugars are usually much higher at supper time she takes a very large dose of insulin at that time She does not have any readings after dinner to assess how her meal is covered but does not appear to have any hypoglycemia symptoms after dinner Fasting blood sugars appear to be significantly better than afternoon readings even though she is not taking any basal insulin or oral hypoglycemic drugs She is asking about taking Trulicity Diet: Usually watches her carbohydrates and limits her calories Recently from her local M.D. A1c result  was 6.3 in September A1c appears lower than expected, no fructosamine has been checked       Oral hypoglycemic drugs the patient is taking are: None      INSULIN regimen is described as:  NovoLog before meals: 15-17acb and acl; 30-40 units Compliance with the medical regimen: Good Hypoglycemia: None    Glucose monitoring:  done 3 times a day         Glucometer:  Accu-Chek      Blood Glucose readings by time of day and averages from meter download:  PREMEAL Breakfast Lunch Dinner Bedtime Overall  Glucose range: 110-168 47-322 67-295    Mean/median: 148 150 200  160    Glycemic control: A1c in 7/15: 6.1 and in 9/15 = 6.3   Lab Results  Component Value Date   HGBA1C * 07/21/2009    6.9 (NOTE) The ADA recommends the following therapeutic goal for glycemic control related to Hgb A1c measurement: Goal of therapy: <6.5 Hgb A1c  Reference: American Diabetes Association: Clinical Practice Recommendations 2010, Diabetes Care, 2010, 33: (Suppl  1).   Lab Results  Component Value Date   MICROALBUR >600.00 mg/dL* 81/79/1058   LDLCALC 610 08/04/2009   CREATININE 1.9* 04/19/2014   Creatinine in late 9/15 was 1.24  Self-care: The diet that the patient has been following is: tries to limit fat 1200-1500 Calories.      Meals: 3 meals per day. Breakfast is oatmeal. Lunch usually half sandwich, has one  carbohydrate at dinner, usually with protein           Exercise: not able to         Dietician visit: Most recent:.               Weight history: Previous range 129-148  Wt Readings from Last 3 Encounters:  04/09/14 132 lb (59.875 kg)  03/03/14 132 lb 6.4 oz (60.056 kg)  04/30/13 137 lb 3.2 oz (62.234 kg)       Medication List       This list is accurate as of: 06/07/14  3:55 PM.  Always use your most recent med list.               ACCU-CHEK AVIVA PLUS W/DEVICE Kit  Use to check blood sugar 3 times per day dx code 250.00     apixaban 5 MG Tabs tablet  Commonly known as:  ELIQUIS   Take 1 tablet (5 mg total) by mouth 2 (two) times daily.     bisoprolol 5 MG tablet  Commonly known as:  ZEBETA  TAKE 1 TABLET BY MOUTH EVERY DAY     calcium carbonate 200 MG capsule  Take 1,000 mg by mouth 2 (two) times daily with a meal.     Chromium 500 MCG Tabs  Take 1 tablet by mouth daily.     ezetimibe 10 MG tablet  Commonly known as:  ZETIA  Take 10 mg by mouth daily.     fish oil-omega-3 fatty acids 1000 MG capsule  Take 1 g by mouth 2 (two) times daily.     furosemide 40 MG tablet  Commonly known as:  LASIX  Take 1 tablet (40 mg total) by mouth 2 (two) times daily.     gabapentin 100 MG capsule  Commonly known as:  NEURONTIN  Take 1 capsule (100 mg total) by mouth 3 (three) times daily.     glucose blood test strip  Commonly known as:  ACCU-CHEK AVIVA PLUS  Use as instructed to check blood sugar 3 times per day dx code 250.00     LANTUS SOLOSTAR 100 UNIT/ML Solostar Pen  Generic drug:  Insulin Glargine  as needed. 5 units nightly     losartan 25 MG tablet  Commonly known as:  COZAAR  TAKE 1 TABLET BY MOUTH EVERY DAY.     MAGNESIUM PO  Take 125 mg by mouth daily.     methadone 10 MG tablet  Commonly known as:  DOLOPHINE  Take 10 mg by mouth 2 (two) times daily.     multivitamin capsule  Take 1 capsule by mouth daily.     nitroGLYCERIN 0.4 MG SL tablet  Commonly known as:  NITROSTAT  Place 1 tablet (0.4 mg total) under the tongue every 5 (five) minutes as needed for chest pain.     NOVOLOG 100 UNIT/ML injection  Generic drug:  insulin aspart  08-81-10     ONETOUCH DELICA LANCETS FINE Misc  Use to check blood sugar 3 times per day dx code 250.00     oxyCODONE-acetaminophen 5-325 MG per tablet  Commonly known as:  PERCOCET/ROXICET  as needed.     pravastatin 80 MG tablet  Commonly known as:  PRAVACHOL  Take 1 tablet (80 mg total) by mouth every evening.     predniSONE 10 MG tablet  Commonly known as:  DELTASONE  Take 10 mg by mouth daily.       PROBIOTIC DAILY PO  Take  by mouth.     RAPAMUNE 1 MG tablet  Generic drug:  sirolimus  Take 1 mg by mouth every other day.     Selenium 200 MCG Caps  Take 1 capsule by mouth daily.     vitamin C 1000 MG tablet  Take 1,000 mg by mouth 2 (two) times daily.     VITAMIN D PO  Take 5,000 Units by mouth daily.        Allergies:  Allergies  Allergen Reactions  . Cephalosporins     Blasted bone marrow  . Azathioprine     Other reaction(s): Unknown  . Other     Other reaction(s): Other (See Comments) Uncoded Allergy. Allergen: Other Allergy: See Patient Chart for Details, Other Reaction: Toxicity    Past Medical History  Diagnosis Date  . Rapid heart rate 11/04/2010  . Hypertension   . Arthritis   . Diabetes mellitus   . Coronary artery disease 2007    A.  MI 01/2003;  B.  02/2006 NSTEMI - LAD 80-90m LCX nl, RCA 928m RCA stented w/ Taxus DES;   C.  2009 Neg MV;   D. 11/2010 Echo - EF 65%;  E. 08/2011 - nstemi, MV [facility] and EF 37%  . Autoimmune liver disease     A.  Followed @ [facility] - on chronic prednisone and  sirolimus  . Hyperlipidemia     inability to stake statins due to liver disease  . Cellulitis     chronic cellulitis of LE  . Chronic kidney disease   . Bulging disc   . Sciatica   . Chronic back pain     A.  On Methadone  . Skin cancer (melanoma)   . Esophageal stricture     narrowing  . Irregular heart beat   . Myocardial infarction 2007 and  Jan. 2012  . Diabetic neuropathy   . Autoimmune liver disease   . Sepsis     Past Surgical History  Procedure Laterality Date  . Cardiac catheterization      Normal EF  . Coronary angioplasty  2007    2 DES placement to RCA complicated by a wire perforation  . Back surgery  1992  . Tubal ligation  1982  . Elbow surgery  1994    neuroma removed left  . Abscess drainage      Right side  . Spine surgery    . Lamenectomy  1989  . Heart stents  2007  and  2012    X's 1  and  2012 X's  2 stents  . Cardioversion N/A 10/31/2012    Procedure: CARDIOVERSION;  Surgeon: DaLarey DresserMD;  [facility];  Service: Cardiovascular;  Laterality: N/A;    Family History  Problem Relation Age of Onset  . Lung cancer Mother 4451. Heart attack Father 795. Heart disease Father   . Congestive Heart Failure Father   . Heart disease Maternal Grandfather   . Hypertension Brother   . Hypertension Son   . Pancreatic cancer Mother     Social History:  reports that she has never smoked. She has never used smokeless tobacco. She reports that she does not drink alcohol or use illicit drugs.    Review of Systems       Lipids: She has been treated with pravastatin 80 mg, no recent lipid levels available       Lab Results  Component Value Date  CHOL 202* 05/27/2012   HDL 87.10 05/27/2012   LDLCALC 192 08/04/2009   LDLDIRECT 99.8 05/27/2012   TRIG 134.0 05/27/2012   CHOLHDL 2 05/27/2012                       She does have a history of Numbness, tingling but no burning in feet  On Metanx for the last 3 years without any relief   Diabetic foot examin 7/15 showed: Absent monofilament and vibration sensation in the toes. Also cannot feel the monofilament in the lower legs,Pedal pulses not palpable   LABS:    No visits with results within 1 Day(s) from this visit. Latest known visit with results is:  Appointment on 04/19/2014  Component Date Value Ref Range Status  . WBC 04/19/2014 13.1* 4.0 - 10.5 K/uL Final  . RBC 04/19/2014 3.69* 3.87 - 5.11 Mil/uL Final  . Platelets 04/19/2014 250.0  150.0 - 400.0 K/uL Final  . Hemoglobin 04/19/2014 11.3* 12.0 - 15.0 g/dL Final  . HCT 04/19/2014 35.2* 36.0 - 46.0 % Final  . MCV 04/19/2014 95.5  78.0 - 100.0 fl Final  . MCHC 04/19/2014 32.1  30.0 - 36.0 g/dL Final  . RDW 04/19/2014 19.5* 11.5 - 15.5 % Final  . Sodium 04/19/2014 132* 135 - 145 mEq/L Final  . Potassium 04/19/2014 4.3  3.5 - 5.1 mEq/L Final  . Chloride 04/19/2014  96  96 - 112 mEq/L Final  . CO2 04/19/2014 25  19 - 32 mEq/L Final  . Glucose, Bld 04/19/2014 136* 70 - 99 mg/dL Final  . BUN 04/19/2014 42* 6 - 23 mg/dL Final  . Creatinine, Ser 04/19/2014 1.9* 0.4 - 1.2 mg/dL Final  . Calcium 04/19/2014 8.9  8.4 - 10.5 mg/dL Final  . GFR 04/19/2014 27.29* >60.00 mL/min Final  . Pro B Natriuretic peptide (BNP) 04/19/2014 445.0* 0.0 - 100.0 pg/mL Final     Physical Examination:  Ht $R'5\' 4"'gi$  (1.626 m)  Exam not indicated    ASSESSMENT:  Diabetes type 2 on insulin inconsistent control She has been on insulin for over 20 years Appears to be needing mostly mealtime insulin coverage as her fasting blood sugars are relatively good Also because of her taking prednisone her blood sugars are much higher in the late afternoons and early evenings She is currently taking NovoLog at mealtimes and takes much larger doses at suppertime; this indicates that she is getting adequate coverage of her insulin requirement in the afternoon and this is partly related to being on prednisone She is however concerned about the cost of her NovoLog insulin Limited treatment options because of her renal insufficiency and CHF   PLAN:   Discussed need for increasing insulin doses at lunchtime She will need to have protein at breakfast consistently Most likely needs less insulin coverage at breakfast and dinner compared to what she is taking now Advised her to adjust her insulin based on her meal size, hydrated intake as well as blood sugar levels Discussed at she does need to have regular postprandial monitoring to help adjust her mealtime doses as well as did detect any hypoglycemia which is asymptomatic Unlikely that she will be able to respond to a GLP-1drug especially with taking prednisone We will need to check fructosamine to help assess overall control  Since she is wanting to have a less expensive insulin than NovoLog she can try Regular Insulin before meals when she runs  out of NovoLog  Follow-up in 2 months  Patient Instructions  Take 14-16 units at  Breakfast and 20 at lunch and 25 at dinner and adjust based on amount of Carbs and meal size   Please check blood sugars at least half the time about 2 hours after any meal and 4 times per week on waking up. Please bring blood sugar monitor to each visit       [facility] 06/07/2014, 3:55 PM   Note: This office note was prepared with Estate agent. Any transcriptional errors that result from this process are unintentional.

## 2014-06-07 NOTE — Patient Instructions (Signed)
Take 14-16 units at  Breakfast and 20 at lunch and 25 at dinner and adjust based on amount of Carbs and meal size   Please check blood sugars at least half the time about 2 hours after any meal and 4 times per week on waking up. Please bring blood sugar monitor to each visit

## 2014-06-10 ENCOUNTER — Ambulatory Visit: Payer: Medicare Other | Admitting: Cardiology

## 2014-06-10 ENCOUNTER — Telehealth: Payer: Self-pay | Admitting: Cardiology

## 2014-06-10 NOTE — Telephone Encounter (Signed)
Patient reports that her Cardioversion had to be cancelled because she was in the hospital for unrelated reasons. She feels like she is in A fib still, and wants to see Dr. Aundra Dubin. Transferred to operators for appointment.

## 2014-06-10 NOTE — Telephone Encounter (Signed)
New message    Patient calling someone call her today . Returning call back

## 2014-06-11 NOTE — Telephone Encounter (Signed)
F/u ° ° °Pt returning your call °

## 2014-06-11 NOTE — Telephone Encounter (Signed)
LMTCB

## 2014-06-11 NOTE — Telephone Encounter (Signed)
Pt states she feels she is back in at fib with a rapid heart rate, requesting appt.  Pt advised I will arrange appt for Monday 06/14/14 and will call her Monday with appt time

## 2014-06-14 ENCOUNTER — Encounter: Payer: Self-pay | Admitting: *Deleted

## 2014-06-14 ENCOUNTER — Ambulatory Visit (INDEPENDENT_AMBULATORY_CARE_PROVIDER_SITE_OTHER): Payer: Medicare Other | Admitting: Cardiology

## 2014-06-14 ENCOUNTER — Encounter: Payer: Self-pay | Admitting: Cardiology

## 2014-06-14 VITALS — BP 134/66 | HR 92 | Ht 64.0 in | Wt 131.8 lb

## 2014-06-14 DIAGNOSIS — I2510 Atherosclerotic heart disease of native coronary artery without angina pectoris: Secondary | ICD-10-CM

## 2014-06-14 DIAGNOSIS — R0602 Shortness of breath: Secondary | ICD-10-CM

## 2014-06-14 DIAGNOSIS — I480 Paroxysmal atrial fibrillation: Secondary | ICD-10-CM

## 2014-06-14 DIAGNOSIS — I5032 Chronic diastolic (congestive) heart failure: Secondary | ICD-10-CM

## 2014-06-14 NOTE — Progress Notes (Signed)
Patient ID: Cynthia Hicks, female   DOB: [birth year], 80 y.o.   MRN: [id number] PCP: Dr. Celedonio Miyamoto  80 yo with history of CAD and autoimmune hepatitis presents for cardiology followup.  She was admitted in 1/13 with NSTEMI.  Left heart cath showed 80% mid LAD stenosis and 99% mid CFX stenosis (likely culprit).  EF was 40-45% by echo and LV-gram.  She had a PROMUS DES to the CFX.  No intervention to the LAD.   Lexiscan myoview done subsequently in 2/13 showed EF 49% with no ischemia.  Echo 4/13 showed EF around 45-50% with basal to mid inferior and mid-apical posterior akinesis along with mild MR.  She was admitted in 12/13 at [facility] with severe cellulitis and sepsis syndrome.  She was seen here by Truitt Merle later in 12/13 and found to be in atrial fibrillation.  Coumadin was started and is followed by her PCP. In 3/14, I cardioverted her to NSR.  In 7/14, she had an echo showing EF 55-60% with basal inferior hypokinesis and a Lexiscan Cardiolite that was low risk with no ischemia.   Since I last saw her, she has been in the hospital again in [place] with cellulitis (3rd time in several months).  Ultrasound evaluation at hospital in [place] was negative for DVT.  She continues to have severe low back pain which limits her ambulation; she was recently found to have a vertebral compression fracture.  She was in atrial fibrillation at last appointment and remains in atrial fibrillation today.  She is short of breath with moderate activity. She is only taking Lasix once daily, but creatinine is stable. No chest pain.  No orthopnea/PND.   ECG: atrial fibrillation, LAFB, lateral TWIs  Labs (4/13): LDL 65, HDL 101, K 3.7, creatinine 1.46, AST 26, ALT 38 Labs (5/13): K 4.4, creatinine 1.54 Labs (10/13): LDL 100, HDL 87 Labs (1/14): K 4.3, creatinine 1.76 Labs (2/14): LDL 65, HDL 109, TGs 111 Labs (4/14): creatinine 1.4 => 1.47, K 4.1, BNP 381 Labs (5/14): K 4.6, creatinine 1.7 Labs (10/14): LDL 68, HDL  80 Labs (6/15): K 4.1, creatinine 1.18 Labs (9/15): HCT 35.2, K 4.3, creatinine 1.9  PMH: 1. CAD: NSTEMI 1/07.  LHC showed 80-90% mid LAD stenosis and 95% mid RCA stenosis.  She had Taxus DES x 2 (overlapping) to the RCA.  Procedure was complicated by wire perforation of PLV, PLV was coil embolized.  Myoview 2009 showed no ischemia or infarction.  NSTEMI 1/13.  LHC with 80% mid LAD (same as 2007), 99% mid CFX, 70% distal RCA.  PROMUS DES to CFX.  Lexiscan myoview (2/13, to assess LAD territory): EF 49%, no ischemia.  Lexiscan Cardiolite (7/14) with EF 48%, no ischemia.  2. Hyperlipidemia: Unable to take Crestor or Lipitor due to leg weakness.  3. Autoimmune hepatitis: Followed at [facility].  On prednisone and sirolimus.   4. Lumbar disc disease/sciatica.  5. CKD 6. Chronic venous insufficiency with chronic R>L lower leg swelling.  7. Paroxysmal atrial fibrillation: Only documented episode was in 7/07.  8. SVT: suppressed by beta blocker.  9. Ischemic cardiomyopathy: Echo (1/13) with EF 40-45%.  EF 40% by LV-gram (1/13).  Echo (4/13): EF 45-50% with basal to mid inferior and mid to apical posterior akinesis and moderate MR.  Echo (7/14) with EF 55-60%, basal inferior HK, mild LVH.  10. Carotid dopplers (12/12) with minimal disease.  11. Cholelithiasis/choledocholithiasis s/p ERCP and sphincterotomy in 2013.  12. Atrial fibrillation: First noted in 12/13.  DCCV 3/14. Back in atrial fibrillation in 9/15.  13. Frozen shoulder on right.   SH: Lives in [place], widowed, retired Engineer, site.  Never smoked.    FH: No premature CAD.    ROS: All systems reviewed and negative except as per HPI.   Current Outpatient Prescriptions  Medication Sig Dispense Refill  . apixaban (ELIQUIS) 5 MG TABS tablet Take 1 tablet (5 mg total) by mouth 2 (two) times daily. (Patient taking differently: Take 5 mg by mouth 2 (two) times daily. Takes 1/2 tablet twice a day) 180 tablet 3  . Ascorbic Acid (VITAMIN C)  1000 MG tablet Take 1,000 mg by mouth 2 (two) times daily.     . bisoprolol (ZEBETA) 5 MG tablet TAKE 1 TABLET BY MOUTH EVERY DAY 90 tablet 1  . Blood Glucose Monitoring Suppl (ACCU-CHEK AVIVA PLUS) W/DEVICE KIT Use to check blood sugar 3 times per day dx code 250.00 1 kit 0  . calcium carbonate 200 MG capsule Take 1,000 mg by mouth 2 (two) times daily with a meal.     . Cholecalciferol (VITAMIN D PO) Take 5,000 Units by mouth daily.      . Chromium 500 MCG TABS Take 1 tablet by mouth daily.      Marland Kitchen ezetimibe (ZETIA) 10 MG tablet Take 10 mg by mouth daily.      . fish oil-omega-3 fatty acids 1000 MG capsule Take 1 g by mouth 2 (two) times daily.     Marland Kitchen gabapentin (NEURONTIN) 100 MG capsule Take 1 capsule (100 mg total) by mouth 3 (three) times daily. 90 capsule 3  . glucose blood (ACCU-CHEK AVIVA PLUS) test strip Use as instructed to check blood sugar 3 times per day dx code 250.00 100 each 3  . losartan (COZAAR) 25 MG tablet TAKE 1 TABLET BY MOUTH EVERY DAY. 90 tablet 1  . MAGNESIUM PO Take 125 mg by mouth daily.    . methadone (DOLOPHINE) 10 MG tablet Take 10 mg by mouth 2 (two) times daily.      . Multiple Vitamin (MULTIVITAMIN) capsule Take 1 capsule by mouth daily.      . nitroGLYCERIN (NITROSTAT) 0.4 MG SL tablet Place 1 tablet (0.4 mg total) under the tongue every 5 (five) minutes as needed for chest pain. 25 tablet 3  . NOVOLOG 100 UNIT/ML injection 15-17 at breakfast and lunch and 35-40 units at dinner    . omeprazole (PRILOSEC) 20 MG capsule Take 20 mg by mouth daily.     Glory Rosebush DELICA LANCETS FINE MISC Use to check blood sugar 3 times per day dx code 250.00 100 each 3  . oxyCODONE-acetaminophen (PERCOCET/ROXICET) 5-325 MG per tablet as needed.    . pravastatin (PRAVACHOL) 80 MG tablet Take 1 tablet (80 mg total) by mouth every evening. 90 tablet 1  . predniSONE (DELTASONE) 10 MG tablet Take 10 mg by mouth daily.      . Probiotic Product (PROBIOTIC DAILY PO) Take by mouth.    .  Selenium 200 MCG CAPS Take 1 capsule by mouth daily.      . sirolimus (RAPAMUNE) 1 MG tablet Take 1 mg by mouth every other day.      . furosemide (LASIX) 40 MG tablet Take 1 tablet (40 mg total) by mouth daily.     No current facility-administered medications for this visit.   BP 134/66 mmHg  Pulse 92  Ht _0  (1.626 m)  Wt 131 lb 12.8 oz (59.784 kg)  BMI 22.61 kg/m2 General: NAD Neck: JVP 7 cm, no thyromegaly or thyroid nodule.  Lungs: Clear to auscultation bilaterally with normal respiratory effort. CV: Nondisplaced PMI.  Heart regular S1/S2, no S3/S4, no murmur.  1+ ankle edema. Abdomen: Soft, nontender, no hepatosplenomegaly, no distention.   Neurologic: Alert and oriented x 3.  Psych: Normal affect. Extremities: No clubbing or cyanosis. Venous stasis changes lower legs bilaterally.   Assessment/Plan:  Chronic kidney disease  Relatively stable.  Repeat BMET today.    Coronary artery disease  NSTEMI with Promus DES to CFX in 1/13. Has residual 80% mid LAD stenosis. This was also seen on 2007 cath, and Lexiscan myoview in 7/14 showed no ischemia.  No chest pain.  - She is anticoagulated with stable CAD, so not on ASA.  - She will continue ARB, beta blocker, and statin.  HYPERLIPIDEMIA Good lipids when last checked.  Chronic diastolic CHF EF was improved to 55-60% on most recent echo in 7/14.  She does not look volume overloaded on exam today.  Stable NYHA class II-III symptoms. - Continue Lasix 40 mg daily.   - BMET/BNP today.   Atrial fibrillation She remains in atrial fibrillation today.  She has been on Eliquis for > 1 month without missing doses.  She is on 2.5 mg bid given creatinine > 1.5 and weight < 60 kg.  I will arrange for DCCV (she maintained NSR for about 1.5 years after last cardioversion).    [name] 06/14/2014

## 2014-06-14 NOTE — Patient Instructions (Signed)
Your physician recommends that you have  lab work today--BMET/BNP.  Your physician has recommended that you have a Cardioversion (DCCV). Electrical Cardioversion uses a jolt of electricity to your heart either through paddles or wired patches attached to your chest. This is a controlled, usually prescheduled, procedure. Defibrillation is done under light anesthesia in the hospital, and you usually go home the day of the procedure. This is done to get your heart back into a normal rhythm. You are not awake for the procedure. Please see the instruction sheet given to you today. Friday November 13,2015  Your physician recommends that you schedule a follow-up appointment in: 2 weeks with PA/NP.

## 2014-06-14 NOTE — Telephone Encounter (Signed)
Pt advised I have scheduled appt for her today at 1:15PM with Dr Aundra Dubin.

## 2014-06-15 LAB — BRAIN NATRIURETIC PEPTIDE: PRO B NATRI PEPTIDE: 592 pg/mL — AB (ref 0.0–100.0)

## 2014-06-15 LAB — BASIC METABOLIC PANEL
BUN: 42 mg/dL — ABNORMAL HIGH (ref 6–23)
CO2: 27 mEq/L (ref 19–32)
Calcium: 9.1 mg/dL (ref 8.4–10.5)
Chloride: 100 mEq/L (ref 96–112)
Creatinine, Ser: 1.6 mg/dL — ABNORMAL HIGH (ref 0.4–1.2)
GFR: 34.29 mL/min — AB (ref >60.00)
GLUCOSE: 316 mg/dL — AB (ref 70–99)
POTASSIUM: 4.4 meq/L (ref 3.5–5.1)
Sodium: 140 mEq/L (ref 135–145)

## 2014-06-17 ENCOUNTER — Telehealth: Payer: Self-pay | Admitting: Cardiology

## 2014-06-17 NOTE — Telephone Encounter (Signed)
Patient reports that after her fall, she injured her knees and got a bump on her head. Was evaluated by a h h nurse, and it was determined she didn't need urgent medical care. Her concern today is that she has a temp of 99.0 orally, and doesn't know if she should proceed with cardioversion. I told her she should go, but to check her temperature in the am, and call us if it is really elevated.

## 2014-06-17 NOTE — Telephone Encounter (Signed)
New message     Patient calling C/O recent fall on Tuesday, think she might of have a pull muscle in back  . Temp  99. By mouth.  Options on upcoming procedure.

## 2014-06-18 ENCOUNTER — Emergency Department (HOSPITAL_COMMUNITY): Payer: Medicare Other

## 2014-06-18 ENCOUNTER — Encounter (HOSPITAL_COMMUNITY)
Admission: RE | Disposition: A | Discharge status: Home / Self Care | Payer: Self-pay | Source: Ambulatory Visit | Attending: Cardiology

## 2014-06-18 ENCOUNTER — Ambulatory Visit (HOSPITAL_COMMUNITY): Payer: Medicare Other

## 2014-06-18 ENCOUNTER — Ambulatory Visit (HOSPITAL_COMMUNITY)
Admission: RE | Admit: 2014-06-18 | Discharge: 2014-06-18 | Disposition: A | Discharge status: Home / Self Care | Payer: Medicare Other | Source: Ambulatory Visit | Attending: Cardiology | Admitting: Cardiology

## 2014-06-18 ENCOUNTER — Encounter (HOSPITAL_COMMUNITY): Payer: Self-pay | Admitting: *Deleted

## 2014-06-18 ENCOUNTER — Inpatient Hospital Stay (HOSPITAL_COMMUNITY)
Admission: EM | Admit: 2014-06-18 | Discharge: 2014-06-24 | DRG: 602 | Disposition: A | Discharge status: Skilled Nursing Facility | Payer: Medicare Other | Attending: Internal Medicine | Admitting: Internal Medicine

## 2014-06-18 DIAGNOSIS — M549 Dorsalgia, unspecified: Secondary | ICD-10-CM

## 2014-06-18 DIAGNOSIS — I2510 Atherosclerotic heart disease of native coronary artery without angina pectoris: Secondary | ICD-10-CM | POA: Diagnosis present

## 2014-06-18 DIAGNOSIS — Z7901 Long term (current) use of anticoagulants: Secondary | ICD-10-CM | POA: Diagnosis not present

## 2014-06-18 DIAGNOSIS — R0902 Hypoxemia: Secondary | ICD-10-CM | POA: Diagnosis present

## 2014-06-18 DIAGNOSIS — Z800 Family history of malignant neoplasm of digestive organs: Secondary | ICD-10-CM

## 2014-06-18 DIAGNOSIS — E1165 Type 2 diabetes mellitus with hyperglycemia: Secondary | ICD-10-CM | POA: Diagnosis present

## 2014-06-18 DIAGNOSIS — J189 Pneumonia, unspecified organism: Secondary | ICD-10-CM | POA: Diagnosis present

## 2014-06-18 DIAGNOSIS — I5043 Acute on chronic combined systolic (congestive) and diastolic (congestive) heart failure: Secondary | ICD-10-CM | POA: Diagnosis present

## 2014-06-18 DIAGNOSIS — I10 Essential (primary) hypertension: Secondary | ICD-10-CM | POA: Diagnosis present

## 2014-06-18 DIAGNOSIS — I890 Lymphedema, not elsewhere classified: Secondary | ICD-10-CM | POA: Diagnosis present

## 2014-06-18 DIAGNOSIS — Y95 Nosocomial condition: Secondary | ICD-10-CM | POA: Diagnosis present

## 2014-06-18 DIAGNOSIS — M5136 Other intervertebral disc degeneration, lumbar region: Secondary | ICD-10-CM | POA: Diagnosis present

## 2014-06-18 DIAGNOSIS — I340 Nonrheumatic mitral (valve) insufficiency: Secondary | ICD-10-CM | POA: Diagnosis present

## 2014-06-18 DIAGNOSIS — Z951 Presence of aortocoronary bypass graft: Secondary | ICD-10-CM

## 2014-06-18 DIAGNOSIS — S81012A Laceration without foreign body, left knee, initial encounter: Secondary | ICD-10-CM | POA: Diagnosis present

## 2014-06-18 DIAGNOSIS — Z889 Allergy status to unspecified drugs, medicaments and biological substances status: Secondary | ICD-10-CM

## 2014-06-18 DIAGNOSIS — G8929 Other chronic pain: Secondary | ICD-10-CM | POA: Diagnosis present

## 2014-06-18 DIAGNOSIS — Z85820 Personal history of malignant melanoma of skin: Secondary | ICD-10-CM

## 2014-06-18 DIAGNOSIS — I5033 Acute on chronic diastolic (congestive) heart failure: Secondary | ICD-10-CM

## 2014-06-18 DIAGNOSIS — Z79899 Other long term (current) drug therapy: Secondary | ICD-10-CM

## 2014-06-18 DIAGNOSIS — W19XXXA Unspecified fall, initial encounter: Secondary | ICD-10-CM | POA: Diagnosis present

## 2014-06-18 DIAGNOSIS — I129 Hypertensive chronic kidney disease with stage 1 through stage 4 chronic kidney disease, or unspecified chronic kidney disease: Secondary | ICD-10-CM | POA: Diagnosis present

## 2014-06-18 DIAGNOSIS — IMO0002 Reserved for concepts with insufficient information to code with codable children: Secondary | ICD-10-CM

## 2014-06-18 DIAGNOSIS — Z7952 Long term (current) use of systemic steroids: Secondary | ICD-10-CM | POA: Diagnosis not present

## 2014-06-18 DIAGNOSIS — Z794 Long term (current) use of insulin: Secondary | ICD-10-CM

## 2014-06-18 DIAGNOSIS — S0121XA Laceration without foreign body of nose, initial encounter: Secondary | ICD-10-CM | POA: Diagnosis present

## 2014-06-18 DIAGNOSIS — Z8249 Family history of ischemic heart disease and other diseases of the circulatory system: Secondary | ICD-10-CM

## 2014-06-18 DIAGNOSIS — M1990 Unspecified osteoarthritis, unspecified site: Secondary | ICD-10-CM | POA: Diagnosis present

## 2014-06-18 DIAGNOSIS — E785 Hyperlipidemia, unspecified: Secondary | ICD-10-CM | POA: Diagnosis present

## 2014-06-18 DIAGNOSIS — I4892 Unspecified atrial flutter: Secondary | ICD-10-CM | POA: Diagnosis present

## 2014-06-18 DIAGNOSIS — L03115 Cellulitis of right lower limb: Principal | ICD-10-CM

## 2014-06-18 DIAGNOSIS — E1140 Type 2 diabetes mellitus with diabetic neuropathy, unspecified: Secondary | ICD-10-CM | POA: Diagnosis present

## 2014-06-18 DIAGNOSIS — I4891 Unspecified atrial fibrillation: Secondary | ICD-10-CM

## 2014-06-18 DIAGNOSIS — N390 Urinary tract infection, site not specified: Secondary | ICD-10-CM | POA: Diagnosis present

## 2014-06-18 DIAGNOSIS — M545 Low back pain: Secondary | ICD-10-CM | POA: Diagnosis present

## 2014-06-18 DIAGNOSIS — K754 Autoimmune hepatitis: Secondary | ICD-10-CM | POA: Diagnosis present

## 2014-06-18 DIAGNOSIS — N183 Chronic kidney disease, stage 3 unspecified: Secondary | ICD-10-CM

## 2014-06-18 DIAGNOSIS — Z881 Allergy status to other antibiotic agents status: Secondary | ICD-10-CM

## 2014-06-18 DIAGNOSIS — Z0189 Encounter for other specified special examinations: Secondary | ICD-10-CM

## 2014-06-18 DIAGNOSIS — R7881 Bacteremia: Secondary | ICD-10-CM | POA: Diagnosis present

## 2014-06-18 DIAGNOSIS — M810 Age-related osteoporosis without current pathological fracture: Secondary | ICD-10-CM | POA: Diagnosis present

## 2014-06-18 DIAGNOSIS — D638 Anemia in other chronic diseases classified elsewhere: Secondary | ICD-10-CM | POA: Diagnosis present

## 2014-06-18 DIAGNOSIS — I252 Old myocardial infarction: Secondary | ICD-10-CM

## 2014-06-18 DIAGNOSIS — W19XXXD Unspecified fall, subsequent encounter: Secondary | ICD-10-CM

## 2014-06-18 DIAGNOSIS — Z79891 Long term (current) use of opiate analgesic: Secondary | ICD-10-CM

## 2014-06-18 DIAGNOSIS — Y92009 Unspecified place in unspecified non-institutional (private) residence as the place of occurrence of the external cause: Secondary | ICD-10-CM

## 2014-06-18 DIAGNOSIS — I480 Paroxysmal atrial fibrillation: Secondary | ICD-10-CM | POA: Diagnosis present

## 2014-06-18 DIAGNOSIS — IMO0001 Reserved for inherently not codable concepts without codable children: Secondary | ICD-10-CM

## 2014-06-18 LAB — URINALYSIS, ROUTINE W REFLEX MICROSCOPIC
Bilirubin Urine: NEGATIVE
Glucose, UA: NEGATIVE mg/dL
Hgb urine dipstick: NEGATIVE
Ketones, ur: 15 mg/dL — AB
Nitrite: NEGATIVE
Protein, ur: NEGATIVE mg/dL
Specific Gravity, Urine: 1.018 (ref 1.005–1.030)
Urobilinogen, UA: 0.2 mg/dL (ref 0.0–1.0)
pH: 5 (ref 5.0–8.0)

## 2014-06-18 LAB — CBC WITH DIFFERENTIAL/PLATELET
Basophils Absolute: 0 10*3/uL (ref 0.0–0.1)
Basophils Absolute: 0 10*3/uL (ref 0.0–0.1)
Basophils Relative: 0 % (ref 0–1)
Basophils Relative: 0 % (ref 0–1)
Eosinophils Absolute: 0 10*3/uL (ref 0.0–0.7)
Eosinophils Absolute: 0 10*3/uL (ref 0.0–0.7)
Eosinophils Relative: 0 % (ref 0–5)
Eosinophils Relative: 0 % (ref 0–5)
HCT: 35.7 % — ABNORMAL LOW (ref 36.0–46.0)
HCT: 39.4 % (ref 36.0–46.0)
HEMOGLOBIN: 11.3 g/dL — AB (ref 12.0–15.0)
Hemoglobin: 12.6 g/dL (ref 12.0–15.0)
LYMPHS ABS: 2.2 10*3/uL (ref 0.7–4.0)
Lymphocytes Relative: 15 % (ref 12–46)
Lymphocytes Relative: 17 % (ref 12–46)
Lymphs Abs: 1.9 10*3/uL (ref 0.7–4.0)
MCH: 29.9 pg (ref 26.0–34.0)
MCH: 30 pg (ref 26.0–34.0)
MCHC: 31.7 g/dL (ref 30.0–36.0)
MCHC: 32 g/dL (ref 30.0–36.0)
MCV: 94.4 fL (ref 78.0–100.0)
MCV: 93.8 fL (ref 78.0–100.0)
Monocytes Absolute: 1.5 10*3/uL — ABNORMAL HIGH (ref 0.1–1.0)
Monocytes Absolute: 1.2 10*3/uL — ABNORMAL HIGH (ref 0.1–1.0)
Monocytes Relative: 11 % (ref 3–12)
Monocytes Relative: 10 % (ref 3–12)
NEUTROS ABS: 10.5 10*3/uL — AB (ref 1.7–7.7)
NEUTROS PCT: 74 % (ref 43–77)
Neutro Abs: 8.1 10*3/uL — ABNORMAL HIGH (ref 1.7–7.7)
Neutrophils Relative %: 73 % (ref 43–77)
Platelets: 150 10*3/uL (ref 150–400)
Platelets: 143 10*3/uL — ABNORMAL LOW (ref 150–400)
RBC: 3.78 MIL/uL — ABNORMAL LOW (ref 3.87–5.11)
RBC: 4.2 MIL/uL (ref 3.87–5.11)
RDW: 20 % — ABNORMAL HIGH (ref 11.5–15.5)
RDW: 20.1 % — ABNORMAL HIGH (ref 11.5–15.5)
WBC: 14.2 10*3/uL — ABNORMAL HIGH (ref 4.0–10.5)
WBC: 11.1 10*3/uL — ABNORMAL HIGH (ref 4.0–10.5)

## 2014-06-18 LAB — GLUCOSE, CAPILLARY
GLUCOSE-CAPILLARY: 167 mg/dL — AB (ref 70–99)
Glucose-Capillary: 226 mg/dL — ABNORMAL HIGH (ref 70–99)

## 2014-06-18 LAB — TROPONIN I

## 2014-06-18 LAB — TSH: TSH: 3.55 u[IU]/mL (ref 0.350–4.500)

## 2014-06-18 LAB — COMPREHENSIVE METABOLIC PANEL
ALT: 16 U/L (ref 0–35)
AST: 23 U/L (ref 0–37)
Albumin: 2.8 g/dL — ABNORMAL LOW (ref 3.5–5.2)
Alkaline Phosphatase: 80 U/L (ref 39–117)
Anion gap: 16 — ABNORMAL HIGH (ref 5–15)
BUN: 37 mg/dL — ABNORMAL HIGH (ref 6–23)
CO2: 26 mEq/L (ref 19–32)
Calcium: 9.2 mg/dL (ref 8.4–10.5)
Chloride: 98 mEq/L (ref 96–112)
Creatinine, Ser: 1.48 mg/dL — ABNORMAL HIGH (ref 0.50–1.10)
GFR calc Af Amer: 38 mL/min — ABNORMAL LOW (ref >90)
GFR calc non Af Amer: 33 mL/min — ABNORMAL LOW (ref >90)
Glucose, Bld: 223 mg/dL — ABNORMAL HIGH (ref 70–99)
Potassium: 4.5 mEq/L (ref 3.7–5.3)
Sodium: 140 mEq/L (ref 137–147)
Total Bilirubin: 0.7 mg/dL (ref 0.3–1.2)
Total Protein: 6.7 g/dL (ref 6.0–8.3)

## 2014-06-18 LAB — I-STAT CG4 LACTIC ACID, ED: Lactic Acid, Venous: 1.93 mmol/L (ref 0.5–2.2)

## 2014-06-18 LAB — URINE MICROSCOPIC-ADD ON

## 2014-06-18 SURGERY — CANCELLED PROCEDURE

## 2014-06-18 MED ORDER — SIROLIMUS 1 MG PO TABS
1.0000 mg | ORAL_TABLET | ORAL | Status: DC
  Administered 2014-06-19 – 2014-06-23 (×3): 1 mg via ORAL
  Filled 2014-06-18 (×3): qty 1

## 2014-06-18 MED ORDER — VITAMIN D3 25 MCG (1000 UNIT) PO TABS
2000.0000 [IU] | ORAL_TABLET | Freq: Every day | ORAL | Status: DC
  Administered 2014-06-18 – 2014-06-23 (×6): 2000 [IU] via ORAL
  Filled 2014-06-18 (×7): qty 2

## 2014-06-18 MED ORDER — APIXABAN 2.5 MG PO TABS
2.5000 mg | ORAL_TABLET | Freq: Two times a day (BID) | ORAL | Status: DC
  Administered 2014-06-18 – 2014-06-24 (×12): 2.5 mg via ORAL
  Filled 2014-06-18 (×13): qty 1

## 2014-06-18 MED ORDER — HYDROCODONE-ACETAMINOPHEN 5-325 MG PO TABS
1.0000 | ORAL_TABLET | Freq: Once | ORAL | Status: AC
  Administered 2014-06-18: 1 via ORAL
  Filled 2014-06-18: qty 1

## 2014-06-18 MED ORDER — LEVOFLOXACIN IN D5W 750 MG/150ML IV SOLN
750.0000 mg | Freq: Once | INTRAVENOUS | Status: AC
  Administered 2014-06-18: 750 mg via INTRAVENOUS
  Filled 2014-06-18: qty 150

## 2014-06-18 MED ORDER — METHADONE HCL 10 MG PO TABS
10.0000 mg | ORAL_TABLET | Freq: Two times a day (BID) | ORAL | Status: DC
  Administered 2014-06-18 – 2014-06-24 (×12): 10 mg via ORAL
  Filled 2014-06-18 (×12): qty 1

## 2014-06-18 MED ORDER — SODIUM CHLORIDE 0.9 % IV SOLN
Freq: Once | INTRAVENOUS | Status: AC
  Administered 2014-06-18: 12:00:00 via INTRAVENOUS

## 2014-06-18 MED ORDER — CALCIUM CARBONATE 1250 (500 CA) MG PO TABS
1.0000 | ORAL_TABLET | Freq: Two times a day (BID) | ORAL | Status: DC
  Administered 2014-06-19 – 2014-06-24 (×10): 500 mg via ORAL
  Filled 2014-06-18 (×13): qty 1

## 2014-06-18 MED ORDER — VANCOMYCIN HCL 500 MG IV SOLR
500.0000 mg | Freq: Once | INTRAVENOUS | Status: AC
  Administered 2014-06-18: 500 mg via INTRAVENOUS
  Filled 2014-06-18: qty 500

## 2014-06-18 MED ORDER — VANCOMYCIN HCL 500 MG IV SOLR
500.0000 mg | INTRAVENOUS | Status: DC
  Administered 2014-06-19 – 2014-06-21 (×3): 500 mg via INTRAVENOUS
  Filled 2014-06-18 (×5): qty 500

## 2014-06-18 MED ORDER — OMEGA-3-ACID ETHYL ESTERS 1 G PO CAPS
1.0000 g | ORAL_CAPSULE | Freq: Two times a day (BID) | ORAL | Status: DC
  Administered 2014-06-18 – 2014-06-24 (×12): 1 g via ORAL
  Filled 2014-06-18 (×13): qty 1

## 2014-06-18 MED ORDER — PREDNISONE 10 MG PO TABS
10.0000 mg | ORAL_TABLET | Freq: Every day | ORAL | Status: DC
  Administered 2014-06-18 – 2014-06-24 (×7): 10 mg via ORAL
  Filled 2014-06-18 (×7): qty 1

## 2014-06-18 MED ORDER — OMEGA-3 FATTY ACIDS 1000 MG PO CAPS: 1.0000 g | ORAL_CAPSULE | Freq: Two times a day (BID) | ORAL | Status: DC

## 2014-06-18 MED ORDER — POLYETHYLENE GLYCOL 3350 17 G PO PACK
17.0000 g | PACK | Freq: Every day | ORAL | Status: DC
  Administered 2014-06-19 – 2014-06-24 (×5): 17 g via ORAL
  Filled 2014-06-18 (×7): qty 1

## 2014-06-18 MED ORDER — DOCUSATE SODIUM 100 MG PO CAPS
100.0000 mg | ORAL_CAPSULE | Freq: Two times a day (BID) | ORAL | Status: DC | PRN
  Administered 2014-06-19 – 2014-06-22 (×2): 100 mg via ORAL
  Filled 2014-06-18 (×2): qty 1

## 2014-06-18 MED ORDER — BISOPROLOL FUMARATE 5 MG PO TABS
5.0000 mg | ORAL_TABLET | Freq: Every day | ORAL | Status: DC
  Filled 2014-06-18: qty 1

## 2014-06-18 MED ORDER — INSULIN ASPART 100 UNIT/ML ~~LOC~~ SOLN
0.0000 [IU] | Freq: Three times a day (TID) | SUBCUTANEOUS | Status: DC
  Administered 2014-06-19 (×2): 3 [IU] via SUBCUTANEOUS
  Administered 2014-06-19: 5 [IU] via SUBCUTANEOUS
  Administered 2014-06-20: 3 [IU] via SUBCUTANEOUS
  Administered 2014-06-20: 1 [IU] via SUBCUTANEOUS
  Administered 2014-06-20 – 2014-06-21 (×2): 2 [IU] via SUBCUTANEOUS
  Administered 2014-06-21: 7 [IU] via SUBCUTANEOUS
  Administered 2014-06-21: 1 [IU] via SUBCUTANEOUS
  Administered 2014-06-22 – 2014-06-23 (×2): 5 [IU] via SUBCUTANEOUS
  Administered 2014-06-23 – 2014-06-24 (×3): 2 [IU] via SUBCUTANEOUS
  Administered 2014-06-24: 1 [IU] via SUBCUTANEOUS

## 2014-06-18 MED ORDER — MORPHINE SULFATE 2 MG/ML IJ SOLN: 2.0000 mg | INTRAMUSCULAR | Status: DC | PRN

## 2014-06-18 MED ORDER — NITROGLYCERIN 0.4 MG SL SUBL
0.4000 mg | SUBLINGUAL_TABLET | SUBLINGUAL | Status: DC | PRN
Start: 2014-06-18 — End: 2014-06-24

## 2014-06-18 MED ORDER — PRAVASTATIN SODIUM 80 MG PO TABS
80.0000 mg | ORAL_TABLET | Freq: Every evening | ORAL | Status: DC
  Administered 2014-06-18 – 2014-06-23 (×6): 80 mg via ORAL
  Filled 2014-06-18 (×7): qty 1

## 2014-06-18 MED ORDER — GABAPENTIN 100 MG PO CAPS
100.0000 mg | ORAL_CAPSULE | Freq: Three times a day (TID) | ORAL | Status: DC
  Administered 2014-06-18 – 2014-06-24 (×17): 100 mg via ORAL
  Filled 2014-06-18 (×19): qty 1

## 2014-06-18 MED ORDER — EZETIMIBE 10 MG PO TABS
10.0000 mg | ORAL_TABLET | Freq: Every day | ORAL | Status: DC
  Administered 2014-06-18 – 2014-06-24 (×7): 10 mg via ORAL
  Filled 2014-06-18 (×7): qty 1

## 2014-06-18 MED ORDER — CALCIUM CARBONATE 600 MG PO TABS: 600.0000 mg | ORAL_TABLET | Freq: Two times a day (BID) | ORAL | Status: DC

## 2014-06-18 MED ORDER — SODIUM CHLORIDE 0.9 % IV BOLUS (SEPSIS)
1000.0000 mL | Freq: Once | INTRAVENOUS | Status: AC
  Administered 2014-06-18: 1000 mL via INTRAVENOUS

## 2014-06-18 MED ORDER — PANTOPRAZOLE SODIUM 40 MG PO TBEC
40.0000 mg | DELAYED_RELEASE_TABLET | Freq: Every day | ORAL | Status: DC
Start: 2014-06-18 — End: 2014-06-24
  Administered 2014-06-18 – 2014-06-24 (×7): 40 mg via ORAL
  Filled 2014-06-18 (×5): qty 1

## 2014-06-18 MED ORDER — BISOPROLOL FUMARATE 5 MG PO TABS
5.0000 mg | ORAL_TABLET | Freq: Every day | ORAL | Status: DC
  Administered 2014-06-18 – 2014-06-24 (×7): 5 mg via ORAL
  Filled 2014-06-18 (×7): qty 1

## 2014-06-18 NOTE — Progress Notes (Signed)
Pt arrived via [facility] for cardioversion. She immediately informed me that she had fallen on Monday 11/9 and was in a lot of pain.  She had a scabbed wound on the bridge of her nose, a laceration on her left knee that had steristrips and bandage applied.  She stated that she called Dr. Claris Gladden office yesterday to cancel the cardioversion because she was not up to it, but the office told her to come in and so she did.  I got her onto stretcher, hooked her up to monitor and verified she continued to be in AFib.  Her O2 sats were low, bounding between 77-87 on room air.  She was put on O2 [place] and titrated up to 6L to get O2 Sat of 90%. She denied SOB or trouble breathing.  She stated that she had a fever last night and her oral temp for me was 100.1.  MD was called.  Dr Aundra Dubin and PA arrived and it was decided to cancel the cardioversion for today and reschedule.  A CXRAY was performed, blood was sent for CBC and Saline Lock was placed.  MD ordered her to get beta blocker and furosemide, however it was also decided to transfer her to ED for further care.  She was transferred via stretcher with sister at her side.  Report was given to ED RN.

## 2014-06-18 NOTE — H&P (Signed)
Date: 06/18/2014               Patient Name:  [facility] MRN: [id number]  DOB: 03-03-1933 Age / Sex: 80 y.o., female   PCP: Cynthia Number, MD         Cynthia Service: Internal Medicine Teaching Service         Attending Physician: Dr. Campbell [facility]    First Contact: Dr. Randell Patient Pager: [phone]  Second Contact: Dr. Denton [facility] Pager: [phone]       After Hours (After 5p/  First Contact Pager: [phone]  weekends / holidays): Second Contact Pager: [phone]   Chief Complaint: Atrial fibrillation  History of Present Illness: [name] is a 80 year old woman with history of paroxysmal atrial fibrillation on Eliquis, HTN, DM2, CAD s/p DES x 2 to RCA in 2007 c/b wire perforation posterior left ventricular br that was coil embolized and DES to CFX in 0/9323, diastolic CHF, CKD, autoimmune hepatitis on prednisone and sirolimus, chronic venous insufficiency presenting with fever. She presented to the [facility] today for scheduled cardioversion of her atrial fibrillation. She was noted to have temperature to 100.1. She notes that she has RLE redness that is similar to past cellulitis episodes she has had. She has had 4 episodes of cellulitis this past year. Her last episode was in October and she was hospitalized at [facility] for five days and treated with 10 day course of Keflex.  She had a fall on Tuesday. She is uncertain if it was mechanical in nature but notes her blood sugar was 53 after. She reports hitting her head. Denies lightheadedness or LOC. She has ecchymosis on her forehead and L knee. She has a laceration on the bridge of her nose and on her left knee.  She was seen in 07/2012 by cardiology and found to be in atrial fibrillation. Coumadin was started. She was cardioverted to normal sinus rhythm 10/2012. She found to be in atrial fibrillation 04/09/2014 and remains in follow up by Dr. Aundra [facility] 06/14/2014.  She denies fevers or chills prior to arrival. She also denies vision changes,  change in baseline dry cough, shortness of breath, chest pain, palpitations, nausea, vomiting, diarrhea, dysuria, hematuria, rash, myalgias, weakness, paresthesias, LH.   Meds: Current Facility-Administered Medications  Medication Dose Route Frequency Provider Last Rate Last Dose  . vancomycin (VANCOCIN) 500 mg in sodium chloride 0.9 % 100 mL IVPB  500 mg Intravenous Once Rolla Flatten, [facility]      . [START ON 06/19/2014] vancomycin (VANCOCIN) 500 mg in sodium chloride 0.9 % 100 mL IVPB  500 mg Intravenous Q24H Rolla Flatten, [facility]       Current Outpatient Prescriptions  Medication Sig Dispense Refill  . apixaban (ELIQUIS) 5 MG TABS tablet Take 1 tablet (5 mg total) by mouth 2 (two) times daily. (Patient taking differently: Take 2.5 mg by mouth 2 (two) times daily. ) 180 tablet 3  . Ascorbic Acid (VITAMIN C) 1000 MG tablet Take 1,000 mg by mouth 2 (two) times daily.     . bisoprolol (ZEBETA) 5 MG tablet TAKE 1 TABLET BY MOUTH EVERY DAY 90 tablet 1  . calcium carbonate (OS-CAL) 600 MG TABS tablet Take 600 mg by mouth 2 (two) times daily with a meal.    . Cholecalciferol (VITAMIN D PO) Take 2,000 Units by mouth at bedtime.     . Chromium 500 MCG TABS Take 1 tablet by mouth daily.      Marland Kitchen ezetimibe (ZETIA)  10 MG tablet Take 10 mg by mouth daily.      . fish oil-omega-3 fatty acids 1000 MG capsule Take 1 g by mouth 2 (two) times daily.     . furosemide (LASIX) 40 MG tablet Take 1 tablet (40 mg total) by mouth daily.    Marland Kitchen gabapentin (NEURONTIN) 100 MG capsule Take 1 capsule (100 mg total) by mouth 3 (three) times daily. 90 capsule 3  . losartan (COZAAR) 25 MG tablet TAKE 1 TABLET BY MOUTH EVERY DAY. 90 tablet 1  . MAGNESIUM PO Take 125 mg by mouth daily.    . methadone (DOLOPHINE) 10 MG tablet Take 10 mg by mouth 2 (two) times daily.      . Multiple Vitamin (MULTIVITAMIN) capsule Take 1 capsule by mouth daily.      . nitroGLYCERIN (NITROSTAT) 0.4 MG SL tablet Place 1 tablet (0.4 mg total)  under the tongue every 5 (five) minutes as needed for chest pain. 25 tablet 3  . NOVOLOG 100 UNIT/ML injection 15-17 at breakfast and lunch and 35-40 units at dinner    . omeprazole (PRILOSEC) 20 MG capsule Take 20 mg by mouth daily.     Marland Kitchen oxyCODONE-acetaminophen (PERCOCET/ROXICET) 5-325 MG per tablet Take 1 tablet by mouth every 8 (eight) hours as needed for moderate pain.     . pravastatin (PRAVACHOL) 80 MG tablet Take 1 tablet (80 mg total) by mouth every evening. 90 tablet 1  . predniSONE (DELTASONE) 10 MG tablet Take 10 mg by mouth daily.      . Probiotic Product (PROBIOTIC DAILY PO) Take 1 tablet by mouth daily.     . Selenium 200 MCG CAPS Take 1 capsule by mouth daily.      . sirolimus (RAPAMUNE) 1 MG tablet Take 1 mg by mouth every other day.      . Blood Glucose Monitoring Suppl (ACCU-CHEK AVIVA PLUS) W/DEVICE KIT Use to check blood sugar 3 times per day dx code 250.00 1 kit 0  . glucose blood (ACCU-CHEK AVIVA PLUS) test strip Use as instructed to check blood sugar 3 times per day dx code 250.00 100 each 3  . ONETOUCH DELICA LANCETS FINE MISC Use to check blood sugar 3 times per day dx code 250.00 100 each 3    Allergies: Allergies as of 06/18/2014 - Review Complete 06/18/2014  Allergen Reaction Noted  . Cephalosporins    . Azathioprine  03/03/2014  . Other  03/03/2014   Past [facility] History  Diagnosis Date  . Rapid heart rate 11/04/2010  . Hypertension   . Arthritis   . Diabetes mellitus   . Coronary artery disease 2007    A.  MI 01/2003;  B.  02/2006 NSTEMI - LAD 80-13m LCX nl, RCA 96m RCA stented w/ Taxus DES;   C.  2009 Neg MV;   D. 11/2010 Echo - EF 65%;  E. 08/2011 - nstemi, MV @ [facility] inflat isch and EF 37%  . Autoimmune liver disease     A.  Followed @ [facility] - on chronic prednisone and  sirolimus  . Hyperlipidemia     inability to stake statins due to liver disease  . Cellulitis     chronic cellulitis of LE  . Chronic kidney disease   . Bulging disc   .  Sciatica   . Chronic back pain     A.  On Methadone  . Skin cancer (melanoma)   . Esophageal stricture     narrowing  .  Irregular heart beat   . Myocardial infarction 2007 and  Jan. 2012  . Diabetic neuropathy   . Autoimmune liver disease   . Sepsis    Past Surgical History  Procedure Laterality Date  . Cardiac catheterization      Normal EF  . Coronary angioplasty  2007    2 DES placement to RCA complicated by a wire perforation  . Back surgery  1992  . Tubal ligation  1982  . Elbow surgery  1994    neuroma removed left  . Abscess drainage      Right side  . Spine surgery    . Lamenectomy  1989  . Heart stents  2007  and  2012    X's 1  and  2012 X's 2 stents  . Cardioversion N/A 10/31/2012    Procedure: CARDIOVERSION;  Surgeon: Larey Dresser, MD;  Location: [facility];  Service: Cardiovascular;  Laterality: N/A;   Family History  Problem Relation Age of Onset  . Lung cancer Mother 59  . Heart attack Father 34  . Heart disease Father   . Congestive Heart Failure Father   . Heart disease Maternal Grandfather   . Hypertension Brother   . Hypertension Son   . Pancreatic cancer Mother    History   Social History  . Marital Status: Married    Spouse Name: John    [facility] of Children: 2  . Years of Education: N/A   Occupational History  . real Sport and exercise psychologist     retired   Social History Main Topics  . Smoking status: Never Smoker   . Smokeless tobacco: Never Used  . Alcohol Use: No  . Drug Use: No  . Sexual Activity: Not on file   Other Topics Concern  . Not on file   Social History Narrative   Pt lives in [place] with husband.  She is retired.  She is not very active @ home.    Review of Systems: Constitutional: no fevers/chills Eyes: no vision changes Ears, nose, mouth, throat, and face: +chronic nonproductive cough Respiratory: no shortness of breath Cardiovascular: no chest pain Gastrointestinal: no nausea/vomiting, +abdominal pain,  +constipation, no diarrhea Genitourinary: no dysuria, no hematuria Integument: no rash Hematologic/lymphatic: +bleeding/bruising, +chronic LE edema Musculoskeletal: +back pain, no myalgias Neurological: no paresthesias, no weakness   Physical Exam: Blood pressure 106/52, temperature 102.4 F (39.1 C), temperature source Rectal, resp. rate 23, SpO2 95 %. General Apperance: NAD Head: Normocephalic, +ecchymosis on R forehead, +healing laceration on bridge of nose Eyes: PERRL, EOMI, anicteric sclera Ears: Normal external ear canal Nose: Nares normal, septum midline, mucosa normal Throat: Lips, mucosa and tongue normal  Neck: Supple, trachea midline Back: No tenderness or bony abnormality  Lungs: Clear to auscultation bilaterally. No wheezes, rhonchi or rales. Breathing comfortably on 2L [place] Chest Wall: Nontender, no deformity Heart: Regular rate and rhythm, no murmur/rub/gallop Abdomen: Soft, nontender, nondistended, no rebound/guarding Extremities: RLE with erythema and some warmth from her ankle to her knee and inner thigh, no drainage, R > L non pitting edema, LLE with healing laceration to knee and ecchymosis and healing lateral wound Pulses: 2+ throughout Skin: No rashes or lesions Neurologic: Alert and oriented x 3. CNII-XII intact. Normal strength and sensation   Lab results: Basic Metabolic Panel:  Recent Labs  06/18/14 1026  NA 140  K 4.5  CL 98  CO2 26  GLUCOSE 223*  BUN 37*  CREATININE 1.48*  CALCIUM 9.2   Liver Function Tests:  Recent Labs  06/18/14 1026  AST 23  ALT 16  ALKPHOS 80  BILITOT 0.7  PROT 6.7  ALBUMIN 2.8*   CBC:  Recent Labs  06/18/14 0926 06/18/14 1026  WBC 14.2* 11.1*  NEUTROABS 10.5* 8.1*  HGB 11.3* 12.6  HCT 35.7* 39.4  MCV 94.4 93.8  PLT 150 143*   CBG:  Recent Labs  06/18/14 0944  GLUCAP 226*   Urinalysis:  Recent Labs  06/18/14 1135  COLORURINE YELLOW  LABSPEC 1.018  PHURINE 5.0  GLUCOSEU NEGATIVE  HGBUR  NEGATIVE  BILIRUBINUR NEGATIVE  KETONESUR 15*  PROTEINUR NEGATIVE  UROBILINOGEN 0.2  NITRITE NEGATIVE  LEUKOCYTESUR SMALL*   Misc. Labs: Lactic acid 06/18/2014 1.93  Imaging results:  Dg Lumbar Spine Complete  06/18/2014   CLINICAL DATA:  80 year old with chronic lower back pain, acutely worse today  EXAM: LUMBAR SPINE - COMPLETE 4+ VIEW  COMPARISON:  CT from 05/24/2014  FINDINGS: There are 5 lumbar type vertebral bodies.  Multilevel degenerative changes of the spine are present, evidenced by disc space narrowing, osteophyte formation and vacuum disc phenomena. Findings are most prominent at the L2-L3, L3-L4, L4-L5 and L5-S1 levels. The partially visualized lower thoracic spine is unremarkable. An unchanged endplate compression deformity of L3 is again noted.  There is no acute fracture or listhesis.  The bone mineralization is diminished.  The partially visualized abdomen demonstrates scattered vascular calcifications and a moderate amount of retained stool.  IMPRESSION: 1. Extensive lumbar spine degenerative disc disease. 2. No acute fracture or listhesis.   Electronically Signed   By: Rosemarie Ax   On: 06/18/2014 16:27   Ct Head Wo Contrast  06/18/2014   CLINICAL DATA:  80 year old recent fall and associated head injury  EXAM: CT HEAD WITHOUT CONTRAST  TECHNIQUE: Contiguous axial images were obtained from the base of the skull through the vertex without intravenous contrast.  COMPARISON:  None.  FINDINGS: There is diffuse, confluent, periventricular white matter hypodensity, compatible with chronic small vessel white matter disease.  There is no evidence of an acute infarct.  There is no acute intracranial hemorrhage.  There is no midline shift or mass effect.  There is no extra-axial fluid collection.  The paranasal sinuses and mastoid air cells are aerated.  The orbits are intact.  IMPRESSION: IMPRESSION  No acute intracranial hemorrhage.   Electronically Signed   By: Rosemarie Ax   On:  06/18/2014 16:14   Dg Chest Port 1 View  06/18/2014   CLINICAL DATA:  Fever, shortness of breath, chest pain x2 days  EXAM: PORTABLE CHEST - 1 VIEW  COMPARISON:  02/15/2014  FINDINGS: Eventration of the right hemidiaphragm. Mild patchy right basilar opacity, likely atelectasis. No pleural effusion or pneumothorax.  The heart is normal in size.  Old right proximal humeral fracture.  IMPRESSION: Mild patchy right basilar opacity, likely atelectasis.   Electronically Signed   By: Julian Hy M.D.   On: 06/18/2014 09:36    Other results: EKG: Atrial fibrillation, t wave inversion aVL, V1, V6 - unchanged from previous EKG  Assessment & Plan by Problem: Active Problems:   Diabetes mellitus   HTN (hypertension)   Autoimmune hepatitis   Coronary artery disease   Chronic back pain   Chronic kidney disease   Atrial fibrillation   Lymphedema  Fever, cellulitis: History of recurrent RLE cellulitis. She has underlying chronic venous insufficiency and lymphedema. She is on chronic prednisone and sirolimus. Fever to 102.4 today with leukocytosis and tachycardia. RLE appears  erythematous with some warmth, no purulent drainage. CXR discussed below. UA with small leukocytes, neg nitrites, few squamous epithelium and few bacteria. She received 529m IV vancomycin in the ED. -Continue vancomycin -Follow up blood cultures -Follow up urine cultures -home lasix 458mPO daily held as her BP is 10016-010ystolic  ?HCAP: CXR with mild patchy R basilar opacity with some hypoxia. She denies shortness of breath or productive cough. She received IV levofloxacin 75071mn the ED. -Continue to monitor  Fall: History of osteoporosis. She had a fall on Tuesday. She is uncertain if it was mechanical in nature but notes her blood sugar was 53 after. She reports hitting her head. Denies lightheadedness or LOC. She has ecchymosis on her forehead and L knee. She has a laceration on the bridge of her nose and on her left  knee. She also notes acute exacerbation of her lower back pain where she was found to have a L3 compression fracture 2 months ago, history of back surgeries. No focal neuro findings on exam. On Eliquis for a fib. CT head without acute intracranial hemorrhage. XR lumbar spine with extensive DDD but no acute fx. -PT -Continue home calcium carbonate 600m51mD and vitamin D 2000u daily -Continue home gabapentin 100mg37m -morphine 2mg Q49m prn pain -continue home methadone 10mg B10mAtrial fibrillation on Eliquis: Scheduled for cardioversion today. Appears to be rate controlled. Cardiology aware of admission -follow up cardiology recommendations -Continue home Eliquis 2.5mg BID63mheck TSH, troponin -continue bisoprolol 5mg dail25mDM2 -Accuchecks q AC, HS -SSI  CAD, diastolic CHF: s/p DES x 2 to RCA in 2007 c/b wire perforation posterior left ventricular br that was coil embolized and DES to CFX in 08/2011 -Repeat CXR in AM -Troponin -continue home Zetia 10mg dail60montinue home fish oil 1g BID -continue home nitrostat 0.4mg Q5min 46m ch73m pain -continue home pravastatin 80mg daily  51m low normal BP in ED -home losartan 25mg daily he57mCKD stage 3: Cr 1.48 on admission, previously 1.6 on 06/14/2014 -continue to monitor  Autoimmune hepatitis: on prednisone and sirolimus -continue home prednisone 10mg daily -co80mue home sirolimus 1mg every other45my  FEN: Heart healthy/carb modified  DVT ppx: continue home Eliquis  Dispo: Disposition is deferred at this time, awaiting improvement of current Cyn[facility] problems. Anticipated discharge in approximately 1-2 day(s).   The patient does have a current PCP (Imran P Haque, MD) andGertie Baronot need [facility] after discharge.  The patient does not have transportation limitations that hinder transportation to clinic appointments.  Signed: Jennifer Krall, Jacques Earthly 1:56 PM

## 2014-06-18 NOTE — ED Notes (Signed)
Pt. A transfer from endo. She was to have cardioversion for afib - asymptomatic. Pt. [place] 06/14/14. Lt knee laceration - steri strips in place; lt. Lower leg (ant.) wound. In endo pts. sao2 was 80's - on 6L [place] - 98 sao2. No respiratory distress. cbg 226 febrile 100.1

## 2014-06-18 NOTE — Progress Notes (Signed)
Patient's 02 sat still 88-90's at 4 liters called Joellen Jersey PA for [facility], with verbal order to take patient to ED

## 2014-06-18 NOTE — Progress Notes (Signed)
ANTIBIOTIC CONSULT NOTE - INITIAL  Pharmacy Consult for Vancomycin Indication: Cellulitis  Allergies  Allergen Reactions  . Cephalosporins     Blasted bone marrow  . Azathioprine     Other reaction(s): Unknown  . Other     Other reaction(s): Other (See Comments) Uncoded Allergy. Allergen: Other Allergy: See Patient Chart for Details, Other Reaction: Toxicity    Patient Measurements:   Ht: 64 inches Wt: 59.8 kg  Vital Signs: Temp: 102.4 F (39.1 C) (11/13 1110) Temp Source: Rectal (11/13 1110) BP: 106/52 mmHg (11/13 1146) Intake/Output from previous day:   Intake/Output from this shift:    Labs:  Recent Labs  06/18/14 0926 06/18/14 1026  WBC 14.2* 11.1*  HGB 11.3* 12.6  PLT 150 143*  CREATININE  --  1.48*   Estimated Creatinine Clearance: 27.1 mL/min (by C-G formula based on Cr of 1.48). No results for input(s): VANCOTROUGH, VANCOPEAK, VANCORANDOM, GENTTROUGH, GENTPEAK, GENTRANDOM, TOBRATROUGH, TOBRAPEAK, TOBRARND, AMIKACINPEAK, AMIKACINTROU, AMIKACIN in the last 72 hours.   Microbiology: No results found for this or any previous visit (from the past 720 hour(s)).  Medical History: Past Medical History  Diagnosis Date  . Rapid heart rate 11/04/2010  . Hypertension   . Arthritis   . Diabetes mellitus   . Coronary artery disease 2007    A.  MI 01/2003;  B.  02/2006 NSTEMI - LAD 80-64m, LCX nl, RCA 6m - RCA stented w/ Taxus DES;   C.  2009 Neg MV;   D. 11/2010 Echo - EF 65%;  E. 08/2011 - nstemi, MV @ [facility] [place] showed inflat isch and EF 37%  . Autoimmune liver disease     A.  Followed @ [facility] - on chronic prednisone and  sirolimus  . Hyperlipidemia     inability to stake statins due to liver disease  . Cellulitis     chronic cellulitis of LE  . Chronic kidney disease   . Bulging disc   . Sciatica   . Chronic back pain     A.  On Methadone  . Skin cancer (melanoma)   . Esophageal stricture     narrowing  . Irregular heart beat   . Myocardial infarction  2007 and  Jan. 2012  . Diabetic neuropathy   . Autoimmune liver disease   . Sepsis     Assessment: 74 YOF with a recent fall earlier this week with laceration on the L-knee and scabbed wound on bridge of nose who presented for cardioversion but was transferred to the [facility] for evaluation in the setting of low O2 sats and fevers. The patient's cardioversion has been canceled and pharmacy has been consulted to start Vancomycin for empiric cellulitis coverage. Wt: 59.8 kg, SCr 1.48, CrCl~25-30 ml/min.   Goal of Therapy:  Vancomycin trough level 10-15 mcg/ml  Plan:  1. Vancomycin 500 mg IV every 24 hours 2. Will continue to follow renal function, culture results, LOT, and antibiotic de-escalation plans   Alycia Rossetti, PharmD, BCPS Clinical Pharmacist Pager: [phone] 06/18/2014 12:12 PM

## 2014-06-18 NOTE — ED Provider Notes (Signed)
CSN: [id number]     Arrival date & time 06/18/14  1020 History   First MD Initiated Contact with Patient 06/18/14 1024     Chief Complaint  Patient presents with  . Fever  . Atrial Fibrillation  . Shortness of Breath  . Leg Pain     (Consider location/radiation/quality/duration/timing/severity/associated sxs/prior Treatment) HPI Patient presents to the emergency department with fever and shortness of breath.  The patient was here at the hospital for cardioversion for atrial fibrillation and they found her to have a temperature and a possible infiltrate on her chest x-ray. patient denies chest pain, nausea, vomiting, diarrhea, cough, runny nose, sore throat, headache, blurred vision,anorexia, weakness, or syncope.  The patient states that she has not had any issues other than a fall on November 9th. Past Medical History  Diagnosis Date  . Rapid heart rate 11/04/2010  . Hypertension   . Arthritis   . Diabetes mellitus   . Coronary artery disease 2007    A.  MI 01/2003;  B.  02/2006 NSTEMI - LAD 80-1m LCX nl, RCA 968m RCA stented w/ Taxus DES;   C.  2009 Neg MV;   D. 11/2010 Echo - EF 65%;  E. 08/2011 - nstemi, MV @ [facility] and EF 37%  . Autoimmune liver disease     A.  Followed @ [facility] - on chronic prednisone and  sirolimus  . Hyperlipidemia     inability to stake statins due to liver disease  . Cellulitis     chronic cellulitis of LE  . Chronic kidney disease   . Bulging disc   . Sciatica   . Chronic back pain     A.  On Methadone  . Skin cancer (melanoma)   . Esophageal stricture     narrowing  . Irregular heart beat   . Myocardial infarction 2007 and  Jan. 2012  . Diabetic neuropathy   . Autoimmune liver disease   . Sepsis    Past Surgical History  Procedure Laterality Date  . Cardiac catheterization      Normal EF  . Coronary angioplasty  2007    2 DES placement to RCA complicated by a wire perforation  . Back surgery  1992  . Tubal ligation  1982   . Elbow surgery  1994    neuroma removed left  . Abscess drainage      Right side  . Spine surgery    . Lamenectomy  1989  . Heart stents  2007  and  2012    X's 1  and  2012 X's 2 stents  . Cardioversion N/A 10/31/2012    Procedure: CARDIOVERSION;  Surgeon: DaLarey DresserMD;  [facility];  Service: Cardiovascular;  Laterality: N/A;   Family History  Problem Relation Age of Onset  . Lung cancer Mother 4434. Heart attack Father 7926. Heart disease Father   . Congestive Heart Failure Father   . Heart disease Maternal Grandfather   . Hypertension Brother   . Hypertension Son   . Pancreatic cancer Mother    History  Substance Use Topics  . Smoking status: Never Smoker   . Smokeless tobacco: Never Used  . Alcohol Use: No   OB History    No data available     Review of Systems  All other systems negative except as documented in the HPI. All pertinent positives and negatives as reviewed in the HPI.  Allergies  Cephalosporins;  Azathioprine; and Other  Home Medications   Prior to Admission medications   Medication Sig Start Date End Date Taking? Authorizing Provider  apixaban (ELIQUIS) 5 MG TABS tablet Take 1 tablet (5 mg total) by mouth 2 (two) times daily. Patient taking differently: Take 2.5 mg by mouth 2 (two) times daily.  05/25/14   Laurey Morale, MD  Ascorbic Acid (VITAMIN C) 1000 MG tablet Take 1,000 mg by mouth 2 (two) times daily.     Historical Provider, MD  bisoprolol (ZEBETA) 5 MG tablet TAKE 1 TABLET BY MOUTH EVERY DAY 04/09/14   Laurey Morale, MD  Blood Glucose Monitoring Suppl (ACCU-CHEK AVIVA PLUS) W/DEVICE KIT Use to check blood sugar 3 times per day dx code 250.00 03/10/14   Reather Littler, MD  calcium carbonate (OS-CAL) 600 MG TABS tablet Take 600 mg by mouth 2 (two) times daily with a meal.    Historical Provider, MD  Cholecalciferol (VITAMIN D PO) Take 2,000 Units by mouth daily.     Historical Provider, MD  Chromium 500 MCG TABS Take 1 tablet  by mouth daily.      Historical Provider, MD  ezetimibe (ZETIA) 10 MG tablet Take 10 mg by mouth daily.      Historical Provider, MD  fish oil-omega-3 fatty acids 1000 MG capsule Take 1 g by mouth 2 (two) times daily.     Historical Provider, MD  furosemide (LASIX) 40 MG tablet Take 1 tablet (40 mg total) by mouth daily. 06/14/14   Laurey Morale, MD  gabapentin (NEURONTIN) 100 MG capsule Take 1 capsule (100 mg total) by mouth 3 (three) times daily. 03/03/14   Reather Littler, MD  glucose blood (ACCU-CHEK AVIVA PLUS) test strip Use as instructed to check blood sugar 3 times per day dx code 250.00 03/10/14   Reather Littler, MD  losartan (COZAAR) 25 MG tablet TAKE 1 TABLET BY MOUTH EVERY DAY. 04/09/14   Laurey Morale, MD  MAGNESIUM PO Take 125 mg by mouth daily.    Historical Provider, MD  methadone (DOLOPHINE) 10 MG tablet Take 10 mg by mouth 2 (two) times daily.      Historical Provider, MD  Multiple Vitamin (MULTIVITAMIN) capsule Take 1 capsule by mouth daily.      Historical Provider, MD  nitroGLYCERIN (NITROSTAT) 0.4 MG SL tablet Place 1 tablet (0.4 mg total) under the tongue every 5 (five) minutes as needed for chest pain. 09/01/11   Ok Anis, NP  NOVOLOG 100 UNIT/ML injection 15-17 at breakfast and lunch and 35-40 units at dinner 10/20/12   Historical Provider, MD  omeprazole (PRILOSEC) 20 MG capsule Take 20 mg by mouth daily.  06/06/14   Historical Provider, MD  Dola Argyle LANCETS FINE MISC Use to check blood sugar 3 times per day dx code 250.00 03/03/14   Reather Littler, MD  oxyCODONE-acetaminophen (PERCOCET/ROXICET) 5-325 MG per tablet Take 1 tablet by mouth every 8 (eight) hours as needed for moderate pain.  08/08/12   Historical Provider, MD  pravastatin (PRAVACHOL) 80 MG tablet Take 1 tablet (80 mg total) by mouth every evening. 04/09/14   Laurey Morale, MD  predniSONE (DELTASONE) 10 MG tablet Take 10 mg by mouth daily.      Historical Provider, MD  Probiotic Product (PROBIOTIC DAILY PO) Take  by mouth.    Historical Provider, MD  Selenium 200 MCG CAPS Take 1 capsule by mouth daily.      Historical Provider, MD  sirolimus (RAPAMUNE) 1 MG  tablet Take 1 mg by mouth every other day.      Historical Provider, MD   BP 93/73 mmHg  Temp(Src) 102.4 F (39.1 C) (Rectal)  Resp 19  SpO2 96% Physical Exam  Constitutional: She is oriented to person, place, and time. She appears well-developed and well-nourished. No distress.  HENT:  Head: Normocephalic and atraumatic.  Mouth/Throat: Oropharynx is clear and moist.  Eyes: Pupils are equal, round, and reactive to light.  Neck: Normal range of motion. Neck supple.  Cardiovascular: Normal heart sounds and normal pulses.  An irregularly irregular rhythm present. Tachycardia present.  Exam reveals no gallop.   No murmur heard. Pulmonary/Chest: Breath sounds normal. She is in respiratory distress. She has no wheezes. She has no rales.  Neurological: She is alert and oriented to person, place, and time. She exhibits normal muscle tone. Coordination normal.  Skin: Skin is warm and dry. Rash noted. There is erythema.  Psychiatric: She has a normal mood and affect. Her behavior is normal.  Nursing note and vitals reviewed.   ED Course  Procedures (including critical care time) Labs Review Labs Reviewed  CBC WITH DIFFERENTIAL - Abnormal; Notable for the following:    WBC 11.1 (*)    RDW 20.1 (*)    Platelets 143 (*)    Neutro Abs 8.1 (*)    Monocytes Absolute 1.2 (*)    All other components within normal limits  COMPREHENSIVE METABOLIC PANEL - Abnormal; Notable for the following:    Glucose, Bld 223 (*)    BUN 37 (*)    Creatinine, Ser 1.48 (*)    Albumin 2.8 (*)    GFR calc non Af Amer 33 (*)    GFR calc Af Amer 38 (*)    Anion gap 16 (*)    All other components within normal limits  URINE CULTURE  CULTURE, BLOOD (ROUTINE X 2)  CULTURE, BLOOD (ROUTINE X 2)  URINALYSIS, ROUTINE W REFLEX MICROSCOPIC  I-STAT CG4 LACTIC ACID, ED     Imaging Review Dg Chest Port 1 View  06/18/2014   CLINICAL DATA:  Fever, shortness of breath, chest pain x2 days  EXAM: PORTABLE CHEST - 1 VIEW  COMPARISON:  02/15/2014  FINDINGS: Eventration of the right hemidiaphragm. Mild patchy right basilar opacity, likely atelectasis. No pleural effusion or pneumothorax.  The heart is normal in size.  Old right proximal humeral fracture.  IMPRESSION: Mild patchy right basilar opacity, likely atelectasis.   Electronically Signed   By: Julian Hy M.D.   On: 06/18/2014 09:36     EKG Interpretation   Date/Time:  Friday June 18 2014 11:30:27 EST Ventricular Rate:  130 PR Interval:    QRS Duration: 103 QT Interval:  334 QTC Calculation: 491 R Axis:   -78 Text Interpretation:  Atrial flutter/fibrillation Left anterior fascicular  block Anteroseptal infarct, old Repol abnrm suggests ischemia, lateral  leads Baseline wander in lead(s) V3 Afib not old, similar to prior  Confirmed by [facility]  MD, [place] [phone]) on 06/18/2014 11:47:03 AM      Patient will be admitted for further evaluation and care of her acute issues. Told to return here as needed.  MDM   Final diagnoses:  CAP (community acquired pneumonia)  Cellulitis of right lower extremity      Brent General, PA-C 06/18/14 1258  Evelina Bucy, MD 06/18/14 1553

## 2014-06-18 NOTE — ED Notes (Signed)
Pt CBG 193.

## 2014-06-19 DIAGNOSIS — B9689 Other specified bacterial agents as the cause of diseases classified elsewhere: Secondary | ICD-10-CM

## 2014-06-19 DIAGNOSIS — R296 Repeated falls: Secondary | ICD-10-CM

## 2014-06-19 DIAGNOSIS — G8929 Other chronic pain: Secondary | ICD-10-CM

## 2014-06-19 DIAGNOSIS — L0390 Cellulitis, unspecified: Secondary | ICD-10-CM

## 2014-06-19 DIAGNOSIS — R509 Fever, unspecified: Secondary | ICD-10-CM

## 2014-06-19 DIAGNOSIS — E119 Type 2 diabetes mellitus without complications: Secondary | ICD-10-CM

## 2014-06-19 DIAGNOSIS — E1165 Type 2 diabetes mellitus with hyperglycemia: Secondary | ICD-10-CM

## 2014-06-19 DIAGNOSIS — IMO0002 Reserved for concepts with insufficient information to code with codable children: Secondary | ICD-10-CM

## 2014-06-19 DIAGNOSIS — L03115 Cellulitis of right lower limb: Secondary | ICD-10-CM

## 2014-06-19 LAB — BASIC METABOLIC PANEL
ANION GAP: 17 — AB (ref 5–15)
BUN: 35 mg/dL — ABNORMAL HIGH (ref 6–23)
CO2: 22 mEq/L (ref 19–32)
CREATININE: 1.55 mg/dL — AB (ref 0.50–1.10)
Calcium: 8.7 mg/dL (ref 8.4–10.5)
Chloride: 102 mEq/L (ref 96–112)
GFR calc non Af Amer: 31 mL/min — ABNORMAL LOW (ref >90)
GFR, EST AFRICAN AMERICAN: 36 mL/min — AB (ref >90)
Glucose, Bld: 209 mg/dL — ABNORMAL HIGH (ref 70–99)
Potassium: 5.1 mEq/L (ref 3.7–5.3)
Sodium: 141 mEq/L (ref 137–147)

## 2014-06-19 LAB — URINE CULTURE: Colony Count: 50000

## 2014-06-19 LAB — CBC
HCT: 31.8 % — ABNORMAL LOW (ref 36.0–46.0)
HEMOGLOBIN: 9.9 g/dL — AB (ref 12.0–15.0)
MCH: 30.2 pg (ref 26.0–34.0)
MCHC: 31.1 g/dL (ref 30.0–36.0)
MCV: 97 fL (ref 78.0–100.0)
Platelets: 124 10*3/uL — ABNORMAL LOW (ref 150–400)
RBC: 3.28 MIL/uL — ABNORMAL LOW (ref 3.87–5.11)
RDW: 20.2 % — AB (ref 11.5–15.5)
WBC: 11.8 10*3/uL — AB (ref 4.0–10.5)

## 2014-06-19 LAB — GLUCOSE, CAPILLARY
GLUCOSE-CAPILLARY: 247 mg/dL — AB (ref 70–99)
Glucose-Capillary: 220 mg/dL — ABNORMAL HIGH (ref 70–99)
Glucose-Capillary: 265 mg/dL — ABNORMAL HIGH (ref 70–99)
Glucose-Capillary: 233 mg/dL — ABNORMAL HIGH (ref 70–99)

## 2014-06-19 MED ORDER — HYDROCODONE-ACETAMINOPHEN 10-325 MG PO TABS
1.0000 | ORAL_TABLET | Freq: Four times a day (QID) | ORAL | Status: DC | PRN
  Administered 2014-06-19 – 2014-06-24 (×14): 1 via ORAL
  Filled 2014-06-19 (×15): qty 1

## 2014-06-19 MED ORDER — LEVOFLOXACIN 750 MG PO TABS
750.0000 mg | ORAL_TABLET | ORAL | Status: DC
  Administered 2014-06-20 – 2014-06-22 (×2): 750 mg via ORAL
  Filled 2014-06-19 (×2): qty 1

## 2014-06-19 MED ORDER — SENNOSIDES-DOCUSATE SODIUM 8.6-50 MG PO TABS
2.0000 | ORAL_TABLET | Freq: Once | ORAL | Status: AC
  Administered 2014-06-19: 2 via ORAL
  Filled 2014-06-19: qty 2

## 2014-06-19 MED ORDER — LEVOFLOXACIN IN D5W 750 MG/150ML IV SOLN: 750.0000 mg | INTRAVENOUS | Status: DC

## 2014-06-19 MED ORDER — FUROSEMIDE 40 MG PO TABS
40.0000 mg | ORAL_TABLET | Freq: Every day | ORAL | Status: DC
  Administered 2014-06-19 – 2014-06-22 (×4): 40 mg via ORAL
  Filled 2014-06-19 (×5): qty 1

## 2014-06-19 NOTE — Progress Notes (Signed)
Subjective: She reports feeling better overall. She still has some back pain. She feels that her cellulitis is improving. Denies SOB, CP, nausea or vomiting.   Objective: Vital signs in last 24 hours: Filed Vitals:   06/18/14 1709 06/18/14 1804 06/18/14 1809 06/18/14 2031  BP: 110/38 123/51  123/57  Pulse: 97 97  97  Temp: 98.4 F (36.9 C) 98.1 F (36.7 C)  98.6 F (37 C)  TempSrc: Oral Oral  Oral  Resp: 20 20  20   Height:   5\' 4"  (1.626 m)   Weight:   131 lb (59.421 kg)   SpO2: 94% 96%  91%   Weight change:  No intake or output data in the 24 hours ending 06/19/14 0839  General Apperance: NAD Head: Normocephalic, +ecchymosis on R forehead, +healing laceration on bridge of nose Eyes: PERRL, EOMI, anicteric sclera Ears: Normal external ear canal Nose: Nares normal, septum midline, mucosa normal Throat: Lips, mucosa and tongue normal  Neck: Supple, trachea midline Back: No tenderness or bony abnormality  Lungs: Clear to auscultation bilaterally. No wheezes, rhonchi or rales. Breathing comfortably on 2L [place] Chest Wall: Nontender, no deformity Heart: Regular rate and rhythm, no murmur/rub/gallop Abdomen: Soft, nontender, nondistended, no rebound/guarding Extremities: RLE with erythema and some warmth from her ankle to her knee that is improving, no drainage, R > L non pitting edema, LLE with healing laceration to knee and ecchymosis and healing lateral wound Pulses: 2+ throughout Neurologic: Alert and oriented x 3. CNII-XII intact. Normal strength and sensation  Lab Results: Basic Metabolic Panel:  Recent Labs Lab 06/18/14 1026 06/19/14 0515  NA 140 141  K 4.5 5.1  CL 98 102  CO2 26 22  GLUCOSE 223* 209*  BUN 37* 35*  CREATININE 1.48* 1.55*  CALCIUM 9.2 8.7   Liver Function Tests:  Recent Labs Lab 06/18/14 1026  AST 23  ALT 16  ALKPHOS 80  BILITOT 0.7  PROT 6.7  ALBUMIN 2.8*   CBC:  Recent Labs Lab 06/18/14 0926 06/18/14 1026 06/19/14 0515  WBC  14.2* 11.1* 11.8*  NEUTROABS 10.5* 8.1*  --   HGB 11.3* 12.6 9.9*  HCT 35.7* 39.4 31.8*  MCV 94.4 93.8 97.0  PLT 150 143* 124*   Cardiac Enzymes:  Recent Labs Lab 06/18/14 2057  TROPONINI <0.30   BNP:  Recent Labs Lab 06/14/14 1542  PROBNP 592.0*   CBG:  Recent Labs Lab 06/18/14 0944 06/18/14 2101 06/19/14 0650  GLUCAP 226* 167* 220*   Thyroid Function Tests:  Recent Labs Lab 06/18/14 2000  TSH 3.550   Urinalysis:  Recent Labs Lab 06/18/14 1135  COLORURINE YELLOW  LABSPEC 1.018  PHURINE 5.0  GLUCOSEU NEGATIVE  HGBUR NEGATIVE  BILIRUBINUR NEGATIVE  KETONESUR 15*  PROTEINUR NEGATIVE  UROBILINOGEN 0.2  NITRITE NEGATIVE  LEUKOCYTESUR SMALL*    Micro Results: Recent Results (from the past 240 hour(s))  Culture, blood (routine x 2)     Status: None (Preliminary result)   Collection Time: 06/18/14 10:26 AM  Result Value Ref Range Status   Specimen Description BLOOD RIGHT ANTECUBITAL  Final   Special Requests BOTTLES DRAWN AEROBIC AND ANAEROBIC 5CC  Final   Culture  Setup Time   Final    06/18/2014 16:33 Performed at [facility]    Culture   Final    [place] Note: Gram Stain Report Called to,Read Back By and Verified With: [facility] GR[place] 06/19/14 AT 0420 [place] Performed at [facility]  Report Status PENDING  Incomplete  Culture, blood (routine x 2)     Status: None (Preliminary result)   Collection Time: 06/18/14 11:00 AM  Result Value Ref Range Status   Specimen Description BLOOD LEFT ANTECUBITAL  Final   Special Requests BOTTLES DRAWN AEROBIC AND ANAEROBIC 10CC  Final   Culture  Setup Time   Final    06/18/2014 16:33 Performed at [facility]    Culture   Final    [place] Note: Gram Stain Report Called to,Read Back By and Verified With: [facility] GRISSOM 06/19/14 AT 0420 [place] Performed at [facility]    Report Status PENDING  Incomplete   Studies/Results: Dg  Lumbar Spine Complete  06/18/2014   CLINICAL DATA:  80 year old with chronic lower back pain, acutely worse today  EXAM: LUMBAR SPINE - COMPLETE 4+ VIEW  COMPARISON:  CT from 05/24/2014  FINDINGS: There are 5 lumbar type vertebral bodies.  Multilevel degenerative changes of the spine are present, evidenced by disc space narrowing, osteophyte formation and vacuum disc phenomena. Findings are most prominent at the L2-L3, L3-L4, L4-L5 and L5-S1 levels. The partially visualized lower thoracic spine is unremarkable. An unchanged endplate compression deformity of L3 is again noted.  There is no acute fracture or listhesis.  The bone mineralization is diminished.  The partially visualized abdomen demonstrates scattered vascular calcifications and a moderate amount of retained stool.  IMPRESSION: 1. Extensive lumbar spine degenerative disc disease. 2. No acute fracture or listhesis.   Electronically Signed   By: Rosemarie Ax   On: 06/18/2014 16:27   Ct Head Wo Contrast  06/18/2014   CLINICAL DATA:  80 year old recent fall and associated head injury  EXAM: CT HEAD WITHOUT CONTRAST  TECHNIQUE: Contiguous axial images were obtained from the base of the skull through the vertex without intravenous contrast.  COMPARISON:  None.  FINDINGS: There is diffuse, confluent, periventricular white matter hypodensity, compatible with chronic small vessel white matter disease.  There is no evidence of an acute infarct.  There is no acute intracranial hemorrhage.  There is no midline shift or mass effect.  There is no extra-axial fluid collection.  The paranasal sinuses and mastoid air cells are aerated.  The orbits are intact.  IMPRESSION: IMPRESSION  No acute intracranial hemorrhage.   Electronically Signed   By: Rosemarie Ax   On: 06/18/2014 16:14   Dg Chest Port 1 View  06/18/2014   CLINICAL DATA:  Fever, shortness of breath, chest pain x2 days  EXAM: PORTABLE CHEST - 1 VIEW  COMPARISON:  02/15/2014  FINDINGS:  Eventration of the right hemidiaphragm. Mild patchy right basilar opacity, likely atelectasis. No pleural effusion or pneumothorax.  The heart is normal in size.  Old right proximal humeral fracture.  IMPRESSION: Mild patchy right basilar opacity, likely atelectasis.   Electronically Signed   By: Julian Hy M.D.   On: 06/18/2014 09:36   Medications: I have reviewed the patient's current medications. Scheduled Meds: . apixaban  2.5 mg Oral BID  . bisoprolol  5 mg Oral Daily  . calcium carbonate  1 tablet Oral BID WC  . cholecalciferol  2,000 Units Oral QHS  . ezetimibe  10 mg Oral Daily  . gabapentin  100 mg Oral TID  . insulin aspart  0-9 Units Subcutaneous TID WC  . [START ON 06/20/2014] levofloxacin  750 mg Oral Q48H  . methadone  10 mg Oral BID  . omega-3 acid ethyl esters  1  g Oral BID  . pantoprazole  40 mg Oral Daily  . polyethylene glycol  17 g Oral Daily  . pravastatin  80 mg Oral QPM  . predniSONE  10 mg Oral Daily  . senna-docusate  2 tablet Oral Once  . sirolimus  1 mg Oral QODAY  . vancomycin  500 mg Intravenous Q24H   Continuous Infusions:  PRN Meds:.docusate sodium, HYDROcodone-acetaminophen, nitroGLYCERIN Assessment/Plan: Active Problems:   Diabetes mellitus   HTN (hypertension)   Autoimmune hepatitis   Coronary artery disease   Chronic back pain   Chronic kidney disease   Atrial fibrillation   Lymphedema  Fever, cellulitis, bacteremia: Blood cultures with GPC in chains -f/u speciation/sensitivities -Continue vancomycin -Follow up urine cultures -Restart lasix 40mg  PO daily  HCAP:  -Continue levofloxacin 750mg  PO every 48 hours  Fall, chronic back pain: PT recommending SNF and 3in1.  -Continue home calcium carbonate 600mg  BID and vitamin D 2000u daily -Continue home gabapentin 100mg  TID -Norco 10/325mg  1 tablet B1YO prn pain, may need extra doses for breakthrough -continue home methadone 10mg  BID  Atrial fibrillation on Eliquis: TSH wnl,  troponin negative -follow up cardiology recommendations -Continue home Eliquis 2.5mg  BID -continue bisoprolol 5mg  daily  DM2 -Accuchecks q AC, HS -SSI  CAD, diastolic CHF: troponin negative -continue home Zetia 10mg  daily -continue home fish oil 1g BID -continue home nitrostat 0.4mg  Q69min prn chest pain -continue home pravastatin 80mg  daily  HTN: normotensive -home losartan 25mg  daily held  CKD stage 3: Cr 1.55, previously 1.6 on 06/14/2014 -continue to monitor  Autoimmune hepatitis: on prednisone and sirolimus -continue home prednisone 10mg  daily -continue home sirolimus 1mg  every other day  FEN: Heart healthy/carb modified  DVT ppx: continue home Eliquis  Dispo: Disposition is deferred at this time, awaiting improvement of current medical problems.  Anticipated discharge in approximately 1 day(s).   The patient does have a current PCP (Imran Gertie Baron, MD) and does not need an [facility] hospital follow-up appointment after discharge.  The patient does not have transportation limitations that hinder transportation to clinic appointments.  .Services Needed at time of discharge: Y = Yes, Blank = No PT:   OT:   RN:   Equipment:   Other:     LOS: 1 day   Jacques Earthly, MD 06/19/2014, 8:39 AM

## 2014-06-19 NOTE — Plan of Care (Signed)
Problem: Phase I Progression Outcomes Goal: Anticoagulation Therapy per MD order Outcome: Completed/Met Date Met:  06/19/14 Goal: Hemodynamically stable Outcome: Completed/Met Date Met:  06/19/14

## 2014-06-19 NOTE — Plan of Care (Signed)
Problem: Phase I Progression Outcomes Goal: Heart rate or rhythm control medication Outcome: Completed/Met Date Met:  06/19/14

## 2014-06-19 NOTE — Evaluation (Addendum)
Physical Therapy Evaluation Patient Details Name: Cynthia Hicks MRN: [id number] DOB: 1933-05-10 Today's Date: 06/19/2014   History of Present Illness  Cynthia Hicks is a 80 year old woman with history of paroxysmal atrial fibrillation on Eliquis, HTN, DM2, CAD s/p DES x 2 to RCA in 2007 c/b wire perforation posterior left ventricular br that was coil embolized and DES to CFX in 10/2669, diastolic CHF, CKD, autoimmune hepatitis on prednisone and sirolimus, chronic venous insufficiency presenting with fever. She presented to the hospital today for scheduled cardioversion of her atrial fibrillation. She was noted to have temperature to 100.1. She notes that she has RLE redness that is similar to past cellulitis episodes she has had.  Clinical Impression  Pt admitted with the above. Pt currently with functional limitations due to the deficits listed below (see PT Problem List). Having difficulty with transfers and ambulation, requiring assistance and patient lives alone. Her greatest complaint at this time is her lower back pain, which appears to musculoskeletal in nature, reporting she first felt the pain occur when she was attempting to get out of a chair at home. She is eager to return home but would need to be at a modified independent level with mobility in order to safely manage on her own. No family, friends, or neighbors available for assistance. Pt will benefit from skilled PT to increase their independence and safety with mobility to allow discharge to the venue listed below.     Follow Up Recommendations SNF;Supervision for mobility/OOB    Equipment Recommendations  3in1 (PT)    Recommendations for Other Services OT consult     Precautions / Restrictions Precautions Precautions: Fall Restrictions Weight Bearing Restrictions: No      Mobility  Bed Mobility                  Transfers Overall transfer level: Needs assistance Equipment used: Rolling walker (2  wheeled) Transfers: Sit to/from Stand Sit to Stand: Min assist         General transfer comment: Min assist for boost to stand from reclining chair. VC for technique and hand placement. Very slow to rise, complains of significant back pain.  Ambulation/Gait Ambulation/Gait assistance: Min assist Ambulation Distance (Feet): 65 Feet Assistive device: Rolling walker (2 wheeled) Gait Pattern/deviations: Step-through pattern;Decreased stride length;Decreased dorsiflexion - right;Trunk flexed   Gait velocity interpretation: Below normal speed for age/gender General Gait Details: Very slow and guarded. Frequent VC for upright posture and min assist occasionally for walker placement within base of support, especially when turning and backing up to chair.   Stairs            Wheelchair Mobility    Modified Rankin (Stroke Patients Only)       Balance Overall balance assessment: Needs assistance;History of Falls Sitting-balance support: No upper extremity supported;Feet supported Sitting balance-Leahy Scale: Fair     Standing balance support: Bilateral upper extremity supported Standing balance-Leahy Scale: Poor                               Pertinent Vitals/Pain Pain Assessment: 0-10 Pain Score: 6  Pain Location: Lower back (started 06/17/14 the night before she came to hospital) Pain Descriptors / Indicators: Aching Pain Intervention(s): Monitored during session;Repositioned;Premedicated before session;Limited activity within patient's tolerance   SpO2 90% room air at rest, HR 88 89% ambulating, HR 104 95% rest on 1.5L supplemental O2, HR 91    Home Living Family/patient expects  to be discharged to:: Private residence Living Arrangements: Alone Available Help at Discharge:  (No family, friends, or neighbors can assist) Type of Home: House Home Access: Stairs to enter Entrance Stairs-Rails: Right Entrance Stairs-Number of Steps: [place]: One  level Home Equipment: [place] - 4 wheels;Cane - quad      Prior Function Level of Independence: Independent with assistive device(s)         Comments: Rollator for longer household distances. Did not use for shorter distances in house     Hand Dominance   Dominant Hand: Right    Extremity/Trunk Assessment   Upper Extremity Assessment: Defer to OT evaluation           Lower Extremity Assessment: Generalized weakness (RLE erythema and edema distal to knee, LLE healing wound.)      Cervical / Trunk Assessment: Kyphotic (throacic kyphosis)  Communication   Communication: No difficulties  Cognition Arousal/Alertness: Awake/alert Behavior During Therapy: WFL for tasks assessed/performed Overall Cognitive Status: Within Functional Limits for tasks assessed                      General Comments General comments (skin integrity, edema, etc.): RLE erythema and edematous consistent with diagnosis of cellulitis, pt reports improvement in symptoms since yesterday. Greatest complaint in lower back pain when started the day prior to coming to hospital for cardioversion. States she was getting out of chair when she felt a lot of pain in her back. Limited assessment of lower back due to pain but is tender to palpation along left lower paraspinals and quadratus lumborum but without significant triggerpoints or tightness noted. I suspect a musculoskeletal injury based on presentation and history of MOI. Recommended and applied ice pack for acute injury to this area. RN aware and instructed to take off in 20-30 minutes. Pt may progress to heat.    Exercises        Assessment/Plan    PT Assessment Patient needs continued PT services  PT Diagnosis Difficulty walking;Acute pain;Generalized weakness   PT Problem List Decreased strength;Decreased range of motion;Decreased activity tolerance;Decreased balance;Decreased mobility;Decreased knowledge of use of DME;Cardiopulmonary status  limiting activity;Pain  PT Treatment Interventions DME instruction;Gait training;Functional mobility training;Therapeutic activities;Therapeutic exercise;Balance training;Neuromuscular re-education;Patient/family education;Modalities;Stair training   PT Goals (Current goals can be found in the Care Plan section) Acute Rehab PT Goals Patient Stated Goal: Go home PT Goal Formulation: With patient Time For Goal Achievement: 07/03/14 Potential to Achieve Goals: Good    Frequency Min 2X/week   Barriers to discharge Decreased caregiver support pt lives alone    Co-evaluation               End of Session Equipment Utilized During Treatment: Gait belt;Oxygen Activity Tolerance: Patient limited by pain Patient left: in chair;with call bell/phone within reach;with nursing/sitter in room Nurse Communication: Mobility status;Other (comment) (SpO2 decreased and O2 1.5L applied via [place])         Time: 0920-0950 PT Time Calculation (min) (ACUTE ONLY): 30 min   Charges:   PT Evaluation $Initial PT Evaluation Tier I: 1 Procedure PT Treatments $Gait Training: 8-22 mins   PT G CodesEllouise Newer 06/19/2014, 10:37 AM  Elayne Snare, [place]  Addendum for vitals

## 2014-06-19 NOTE — Progress Notes (Signed)
Spoke with MD about pt request for pain med, laxative and pt currently in SR and converted prior to shift change yesterday, awknowledged. Sherrie Mustache 8:23 AM

## 2014-06-19 NOTE — Plan of Care (Signed)
Problem: Phase I Progression Outcomes Goal: Ventricular heart rate < 120/min Outcome: Completed/Met Date Met:  06/19/14

## 2014-06-19 NOTE — Plan of Care (Signed)
Problem: Phase I Progression Outcomes Goal: Pain controlled with appropriate interventions Outcome: Completed/Met Date Met:  May 29, 2014

## 2014-06-20 LAB — GLUCOSE, CAPILLARY
GLUCOSE-CAPILLARY: 132 mg/dL — AB (ref 70–99)
GLUCOSE-CAPILLARY: 178 mg/dL — AB (ref 70–99)
Glucose-Capillary: 223 mg/dL — ABNORMAL HIGH (ref 70–99)
Glucose-Capillary: 294 mg/dL — ABNORMAL HIGH (ref 70–99)

## 2014-06-20 LAB — CBC
HEMATOCRIT: 30.2 % — AB (ref 36.0–46.0)
HEMOGLOBIN: 9.6 g/dL — AB (ref 12.0–15.0)
MCH: 30.5 pg (ref 26.0–34.0)
MCHC: 31.8 g/dL (ref 30.0–36.0)
MCV: 95.9 fL (ref 78.0–100.0)
Platelets: 125 10*3/uL — ABNORMAL LOW (ref 150–400)
RBC: 3.15 MIL/uL — ABNORMAL LOW (ref 3.87–5.11)
RDW: 20 % — ABNORMAL HIGH (ref 11.5–15.5)
WBC: 9.7 10*3/uL (ref 4.0–10.5)

## 2014-06-20 LAB — BASIC METABOLIC PANEL
Anion gap: 14 (ref 5–15)
BUN: 39 mg/dL — AB (ref 6–23)
CHLORIDE: 98 meq/L (ref 96–112)
CO2: 26 mEq/L (ref 19–32)
Calcium: 9.1 mg/dL (ref 8.4–10.5)
Creatinine, Ser: 1.59 mg/dL — ABNORMAL HIGH (ref 0.50–1.10)
GFR calc Af Amer: 35 mL/min — ABNORMAL LOW (ref >90)
GFR calc non Af Amer: 30 mL/min — ABNORMAL LOW (ref >90)
GLUCOSE: 178 mg/dL — AB (ref 70–99)
Potassium: 3.8 mEq/L (ref 3.7–5.3)
Sodium: 138 mEq/L (ref 137–147)

## 2014-06-20 NOTE — Plan of Care (Signed)
Problem: Consults Goal: Atrial Arhythmia Patient Education (See Patient Education module for education specifics.)  Outcome: Progressing Goal: Skin Care Protocol Initiated - if Braden Score 18 or less If consults are not indicated, leave blank or document N/A  Outcome: Progressing Goal: Tobacco Cessation referral if indicated Outcome: Not Applicable Date Met:  46/60/56 Goal: Nutrition Consult-if indicated Outcome: Not Applicable Date Met:  37/29/42 Goal: Diabetes Guidelines if Diabetic/Glucose > 140 If diabetic or lab glucose is > 140 mg/dl - Initiate Diabetes/Hyperglycemia Guidelines & Document Interventions  Outcome: Progressing  Problem: Phase I Progression Outcomes Goal: If Ablation, see post EP orders Outcome: Not Applicable Date Met:  62/70/04 Goal: Other Phase I Outcomes/Goals Outcome: Progressing

## 2014-06-20 NOTE — Plan of Care (Signed)
Problem: Phase II Progression Outcomes Goal: Ventricular heart rate < 100/min Outcome: Completed/Met Date Met:  06/20/14 Goal: Anticoagulation Therapy per MD order Outcome: Completed/Met Date Met:  06/20/14 Goal: Pain controlled Outcome: Completed/Met Date Met:  06/20/14 Goal: Tolerating diet Outcome: Completed/Met Date Met:  06/20/14

## 2014-06-20 NOTE — Progress Notes (Signed)
  Date: 06/20/2014  Patient name: Cynthia Hicks  Medical record number: [id number]  Date of birth: February 12, 1933   This patient has been seen and the plan of care was discussed with the house staff. Please see their note for complete details. I concur with their findings with the following additions/corrections:  Without complaints  Blood pressure 131/67, pulse 71, temperature 97.5 F (36.4 C), temperature source Oral, resp. rate 18, height 5\' 4"  (1.626 m), weight 59.421 kg (131 lb), SpO2 99 %. cv- rrr Chest- cta abd- bs+. Soft, non-tender extr- LLE wound is dressed.   RLE is erythematous, warm.   Labs- BCx 2/2 GPC chains.  WBC 9.7   Cr 1.59   GCP bacteremia Cellulitis HCAP A fib DM2 CRI Autoimmune hepatitis (sirolimus, prednisone)  Would continue her current meds, no change in anbx due to allergy hx Repeat her BCx to assure clearance Consider asking wound about appropriate next dressing for her LLE.  Watch her Cr, it is slowly increasing. May need to stop vanco.   Campbell Riches, MD 06/20/2014, 1:08 PM

## 2014-06-20 NOTE — Progress Notes (Addendum)
Subjective: Patient is doing great. No new complaints. Swelling and edema of lower extremity has improved. Some slight redness still present.  Pt feels better overall. Good appetite.  Objective: Vital signs in last 24 hours: Filed Vitals:   06/19/14 1500 06/19/14 2006 06/20/14 0327 06/20/14 1440  BP: 110/41 115/56 131/67 104/52  Pulse: 98 72 71 73  Temp: 97.5 F (36.4 C) 98 F (36.7 C) 97.5 F (36.4 C) 97.5 F (36.4 C)  TempSrc: Oral Oral Oral Oral  Resp: 16 17 18 18   Height:      Weight:      SpO2: 98% 99% 99% 97%   Weight change:   Intake/Output Summary (Last 24 hours) at 06/20/14 1819 Last data filed at 06/20/14 1245  Gross per 24 hour  Intake    240 ml  Output    650 ml  Net   -410 ml    Physical Exam-  Gen - NAD HEENT-  [place], AT, EOMI Chest- normal work of breathing, no added sounds Cardiac- Regular, si s2. GI- Soft, non tender, bowel sounds present Neuro- Alert and oriented X3 Extrem-  Right leg- slight erythema, extending from feet to mid leg, improved compared to prior.  Lab Results: Basic Metabolic Panel:  Recent Labs Lab 06/19/14 0515 06/20/14 0310  NA 141 138  K 5.1 3.8  CL 102 98  CO2 22 26  GLUCOSE 209* 178*  BUN 35* 39*  CREATININE 1.55* 1.59*  CALCIUM 8.7 9.1   Liver Function Tests:  Recent Labs Lab 06/18/14 1026  AST 23  ALT 16  ALKPHOS 80  BILITOT 0.7  PROT 6.7  ALBUMIN 2.8*   CBC:  Recent Labs Lab 06/18/14 0926 06/18/14 1026 06/19/14 0515 06/20/14 0310  WBC 14.2* 11.1* 11.8* 9.7  NEUTROABS 10.5* 8.1*  --   --   HGB 11.3* 12.6 9.9* 9.6*  HCT 35.7* 39.4 31.8* 30.2*  MCV 94.4 93.8 97.0 95.9  PLT 150 143* 124* 125*   Cardiac Enzymes:  Recent Labs Lab 06/18/14 2057  TROPONINI <0.30   BNP:  Recent Labs Lab 06/14/14 1542  PROBNP 592.0*   CBG:  Recent Labs Lab 06/19/14 1309 06/19/14 1621 06/19/14 2107 06/20/14 0627 06/20/14 1212 06/20/14 1639  GLUCAP 247* 265* 233* 132* 178* 223*   Thyroid Function  Tests:  Recent Labs Lab 06/18/14 2000  TSH 3.550   Urinalysis:  Recent Labs Lab 06/18/14 1135  COLORURINE YELLOW  LABSPEC 1.018  PHURINE 5.0  GLUCOSEU NEGATIVE  HGBUR NEGATIVE  BILIRUBINUR NEGATIVE  KETONESUR 15*  PROTEINUR NEGATIVE  UROBILINOGEN 0.2  NITRITE NEGATIVE  LEUKOCYTESUR SMALL*    Micro Results: Recent Results (from the past 240 hour(s))  Culture, blood (routine x 2)     Status: None (Preliminary result)   Collection Time: 06/18/14 10:26 AM  Result Value Ref Range Status   Specimen Description BLOOD RIGHT ANTECUBITAL  Final   Special Requests BOTTLES DRAWN AEROBIC AND ANAEROBIC 5CC  Final   Culture  Setup Time   Final    06/18/2014 16:33 Performed at [facility]    Culture   Final    [place] Note: Gram Stain Report Called to,Read Back By and Verified With: [facility] GRISS[place] 06/19/14 AT 0420 [place] Performed at [facility]    Report Status PENDING  Incomplete  Culture, blood (routine x 2)     Status: None (Preliminary result)   Collection Time: 06/18/14 11:00 AM  Result Value Ref Range Status  Specimen Description BLOOD LEFT ANTECUBITAL  Final   Special Requests BOTTLES DRAWN AEROBIC AND ANAEROBIC 10CC  Final   Culture  Setup Time   Final    06/18/2014 16:33 Performed at [facility]    Culture   Final    [place] Note: Gram Stain Report Called to,Read Back By and Verified With: [facility] GRISSOM 06/19/14 AT 0420 [place] Performed at [facility]    Report Status PENDING  Incomplete  Urine culture     Status: None   Collection Time: 06/18/14 11:35 AM  Result Value Ref Range Status   Specimen Description URINE, CLEAN CATCH  Final   Special Requests NONE  Final   Culture  Setup Time   Final    06/18/2014 12:50 Performed at [place]   Final    50,000 COLONIES/ML Performed at [facility]    Culture   Final    Multiple bacterial  morphotypes present, none predominant. Suggest appropriate recollection if clinically indicated. Performed at [facility]    Report Status 06/19/2014 FINAL  Final   Studies/Results: No results found. Medications: I have reviewed the patient's current medications. Scheduled Meds: . apixaban  2.5 mg Oral BID  . bisoprolol  5 mg Oral Daily  . calcium carbonate  1 tablet Oral BID WC  . cholecalciferol  2,000 Units Oral QHS  . ezetimibe  10 mg Oral Daily  . furosemide  40 mg Oral Daily  . gabapentin  100 mg Oral TID  . insulin aspart  0-9 Units Subcutaneous TID WC  . levofloxacin  750 mg Oral Q48H  . methadone  10 mg Oral BID  . omega-3 acid ethyl esters  1 g Oral BID  . pantoprazole  40 mg Oral Daily  . polyethylene glycol  17 g Oral Daily  . pravastatin  80 mg Oral QPM  . predniSONE  10 mg Oral Daily  . sirolimus  1 mg Oral QODAY  . vancomycin  500 mg Intravenous Q24H   Continuous Infusions:  PRN Meds:.docusate sodium, HYDROcodone-acetaminophen, nitroGLYCERIN Assessment/Plan: Bacteremia- Gram positive cocci. likely source from cellulitis. And increased risk of infection, with immunocompromised state.  - Cont Vanc IV. - Cont Levaquin also as pt has HCAP. - Repeat blood cultures today.    Fever, cellulitis, bacteremia: Blood cultures with GPC in chains, awaiting speciation and sensitivity. -f/u speciation/sensitivities -Continue vancomycin IV -Follow up urine cultures -Restart lasix 40mg  PO daily  HCAP:  -Continue levofloxacin 750mg  PO every 48 hours  Fall, chronic back pain: PT recommending SNF and 3in1.  -Continue home calcium carbonate 600mg  BID and vitamin D 2000u daily -Continue home gabapentin 100mg  TID -Norco 10/325mg  1 tablet Q6hr prn pain, may need extra doses for breakthrough -continue home methadone 10mg  BID  Atrial fibrillation on Eliquis: TSH wnl, troponin negative -follow up cardiology recommendations- Pt has not been seen -Continue home Eliquis  2.5mg  BID -continue bisoprolol 5mg  daily  DM2 -Accuchecks q AC, HS -SSI  CAD, diastolic CHF: troponin negative -continue home Zetia 10mg  daily -continue home fish oil 1g BID -continue home nitrostat 0.4mg  Q29min prn chest pain -continue home pravastatin 80mg  daily  HTN: normotensive -home losartan 25mg  daily held  CKD stage 3: Cr 1.59, at baseline, previously 1.6 on 06/14/2014 -continue to monitor - Will not check labs tomorrow  Autoimmune hepatitis: on prednisone and sirolimus -continue home prednisone 10mg  daily -continue home sirolimus 1mg  every other day  FEN: Heart  healthy/carb modified  DVT ppx: continue home Eliquis  Dispo: Disposition is deferred at this time, awaiting improvement of current medical problems.    The patient does have a current PCP (Imran Gertie Baron, MD) and does not need an [facility] hospital follow-up appointment after discharge.  The patient does not have transportation limitations that hinder transportation to clinic appointments.  .Services Needed at time of discharge: Y = Yes, Blank = No PT:   OT:   RN:   Equipment:   Other:     LOS: 2 days   Bethena Roys, MD 06/20/2014, 6:19 PM

## 2014-06-21 ENCOUNTER — Inpatient Hospital Stay (HOSPITAL_COMMUNITY): Payer: Medicare Other

## 2014-06-21 DIAGNOSIS — I4891 Unspecified atrial fibrillation: Secondary | ICD-10-CM

## 2014-06-21 DIAGNOSIS — I480 Paroxysmal atrial fibrillation: Secondary | ICD-10-CM

## 2014-06-21 DIAGNOSIS — E1165 Type 2 diabetes mellitus with hyperglycemia: Secondary | ICD-10-CM

## 2014-06-21 DIAGNOSIS — R7881 Bacteremia: Secondary | ICD-10-CM

## 2014-06-21 DIAGNOSIS — I10 Essential (primary) hypertension: Secondary | ICD-10-CM

## 2014-06-21 DIAGNOSIS — M549 Dorsalgia, unspecified: Secondary | ICD-10-CM

## 2014-06-21 DIAGNOSIS — L03115 Cellulitis of right lower limb: Principal | ICD-10-CM

## 2014-06-21 DIAGNOSIS — I2510 Atherosclerotic heart disease of native coronary artery without angina pectoris: Secondary | ICD-10-CM

## 2014-06-21 DIAGNOSIS — K754 Autoimmune hepatitis: Secondary | ICD-10-CM

## 2014-06-21 DIAGNOSIS — B954 Other streptococcus as the cause of diseases classified elsewhere: Secondary | ICD-10-CM

## 2014-06-21 DIAGNOSIS — I5030 Unspecified diastolic (congestive) heart failure: Secondary | ICD-10-CM

## 2014-06-21 LAB — BASIC METABOLIC PANEL
Anion gap: 12 (ref 5–15)
BUN: 35 mg/dL — AB (ref 6–23)
CO2: 28 meq/L (ref 19–32)
CREATININE: 1.42 mg/dL — AB (ref 0.50–1.10)
Calcium: 9.1 mg/dL (ref 8.4–10.5)
Chloride: 103 mEq/L (ref 96–112)
GFR calc Af Amer: 40 mL/min — ABNORMAL LOW (ref >90)
GFR calc non Af Amer: 34 mL/min — ABNORMAL LOW (ref >90)
GLUCOSE: 183 mg/dL — AB (ref 70–99)
Potassium: 4.1 mEq/L (ref 3.7–5.3)
Sodium: 143 mEq/L (ref 137–147)

## 2014-06-21 LAB — GLUCOSE, CAPILLARY
GLUCOSE-CAPILLARY: 193 mg/dL — AB (ref 70–99)
GLUCOSE-CAPILLARY: 176 mg/dL — AB (ref 70–99)
Glucose-Capillary: 143 mg/dL — ABNORMAL HIGH (ref 70–99)
Glucose-Capillary: 315 mg/dL — ABNORMAL HIGH (ref 70–99)
Glucose-Capillary: 241 mg/dL — ABNORMAL HIGH (ref 70–99)

## 2014-06-21 MED ORDER — SODIUM CHLORIDE 0.9 % IV SOLN
INTRAVENOUS | Status: DC
  Administered 2014-06-22: 20 mL/h via INTRAVENOUS
  Administered 2014-06-22: 500 mL via INTRAVENOUS

## 2014-06-21 NOTE — Progress Notes (Signed)
Pt seen and examined with Dr. Hulen Luster. Please refer to resident note for details  Pt with strep viridans bacteremia. Will follow up sensitivities. Repeat blood cultures have been negative till date. Pt will need an endocarditis work up as well given possibility of strep viridans endocarditis.  Pt also with worsening back pain. Given positive bacteremia would consider MRI of lumbar spine to rule out osteomyelitis if back pain persists. X rays with no acute fracture C/w abx for HCAP for now

## 2014-06-21 NOTE — Clinical Social Work Placement (Addendum)
Clinical Social Work Department CLINICAL SOCIAL WORK PLACEMENT NOTE 06/21/2014  Patient:  Cynthia Hicks, Cynthia Hicks  Account Number:  [id number] Admit date:  06/18/2014  Clinical Social Worker:  Jeronda Don, LCSWA  Date/time:  06/21/2014 05:03 PM  Clinical Social Work is seeking post-discharge placement for this patient at the following level of care:   SKILLED NURSING   (*CSW will update this form in Epic as items are completed)   06/21/2014  Patient/family provided with [place] Department of Clinical Social Work's list of facilities offering this level of care within the geographic area requested by the patient (or if unable, by the patient's family).  06/21/2014  Patient/family informed of their freedom to choose among providers that offer the needed level of care, that participate in Medicare, Medicaid or managed care program needed by the patient, have an available bed and are willing to accept the patient.  06/21/2014  Patient/family informed of [facility]' ownership interest in [facility], as well as of the fact that they are under no obligation to receive care at this facility.  PASARR submitted to EDS on 06/21/2014 PASARR number received on 06/21/2014  FL2 transmitted to all facilities in geographic area requested by pt/family on  06/21/2014 FL2 transmitted to all facilities within larger geographic area on 06/21/2014  Patient informed that his/her managed care company has contracts with or will negotiate with  certain facilities, including the following:     Patient/family informed of bed offers received: 06/22/14  Patient chooses bed at [facility] in [place]  Physician recommends and patient chooses bed at    Patient to be transferred to [place] on 06/24/14   Patient to be transferred to facility by [facility] Patient and family notified of transfer on 06/24/14 Name of family member notified: [name]    The following physician request were entered in  Epic:   Additional Comments:  Jones Broom. Kathy Wares, MSW, [place] 06/24/2014 1:47 PM

## 2014-06-21 NOTE — Progress Notes (Signed)
ANTIBIOTIC CONSULT NOTE - FOLLOW UP  Pharmacy Consult for Vancomycin Indication: Strep Viridans Bacteremia, r/o endocarditis  Allergies  Allergen Reactions  . Cephalosporins     Blasted bone marrow  . Azathioprine     Other reaction(s): Unknown  . Cyclosporine   . Other     Other reaction(s): Other (See Comments) Uncoded Allergy. Allergen: Other Allergy: See Patient Chart for Details, Other Reaction: Toxicity    Patient Measurements: Height: 5\' 4"  (162.6 cm) Weight: 131 lb (59.421 kg) IBW/kg (Calculated) : 54.7  Vital Signs: Temp: 98.3 F (36.8 C) (11/16 0505) Temp Source: Oral (11/16 0505) BP: 137/62 mmHg (11/16 0505) Pulse Rate: 70 (11/16 0505) Intake/Output from previous day: 11/15 0701 - 11/16 0700 In: 240 [P.O.:240] Out: 1850 [Urine:1850] Intake/Output from this shift: Total I/O In: 240 [P.O.:240] Out: 201 [Urine:200; Stool:1]  Labs:  Recent Labs  06/19/14 0515 06/20/14 0310 06/21/14 0310  WBC 11.8* 9.7  --   HGB 9.9* 9.6*  --   PLT 124* 125*  --   CREATININE 1.55* 1.59* 1.42*   Estimated Creatinine Clearance: 28.2 mL/min (by C-G formula based on Cr of 1.42).  Microbiology: Recent Results (from the past 720 hour(s))  Culture, blood (routine x 2)     Status: None (Preliminary result)   Collection Time: 06/18/14 10:26 AM  Result Value Ref Range Status   Specimen Description BLOOD RIGHT ANTECUBITAL  Final   Special Requests BOTTLES DRAWN AEROBIC AND ANAEROBIC 5CC  Final   Culture  Setup Time   Final    06/18/2014 16:33 Performed at [facility]    Culture   Final    VIRIDANS STREPTOCOCCUS Note: Gram Stain Report Called to,Read Back By and Verified With: [facility] GRISSOM 06/19/14 AT 0420 [place] Performed at [facility]    Report Status PENDING  Incomplete  Culture, blood (routine x 2)     Status: None (Preliminary result)   Collection Time: 06/18/14 11:00 AM  Result Value Ref Range Status   Specimen Description BLOOD LEFT  ANTECUBITAL  Final   Special Requests BOTTLES DRAWN AEROBIC AND ANAEROBIC 10CC  Final   Culture  Setup Time   Final    06/18/2014 16:33 Performed at [facility]    Culture   Final    VIRIDANS STREPTOCOCCUS Note: Gram Stain Report Called to,Read Back By and Verified With: [facility] GRISSOM 06/19/14 AT 0420 [place] Performed at [facility]    Report Status PENDING  Incomplete  Urine culture     Status: None   Collection Time: 06/18/14 11:35 AM  Result Value Ref Range Status   Specimen Description URINE, CLEAN CATCH  Final   Special Requests NONE  Final   Culture  Setup Time   Final    06/18/2014 12:50 Performed at [facility]   Final    50,000 COLONIES/ML Performed at [facility]    Culture   Final    Multiple bacterial morphotypes present, none predominant. Suggest appropriate recollection if clinically indicated. Performed at [facility]    Report Status 06/19/2014 FINAL  Final  Culture, blood (routine x 2)     Status: None (Preliminary result)   Collection Time: 06/20/14  2:00 PM  Result Value Ref Range Status   Specimen Description BLOOD LEFT ARM  Final   Special Requests BOTTLES DRAWN AEROBIC AND ANAEROBIC 10 CC EACH  Final   Culture  Setup Time   Final    06/20/2014 23:28  Performed at [facility]   Final           BLOOD CULTURE RECEIVED NO GROWTH TO DATE CULTURE WILL BE HELD FOR 5 DAYS BEFORE ISSUING A FINAL NEGATIVE REPORT Performed at [facility]    Report Status PENDING  Incomplete  Culture, blood (routine x 2)     Status: None (Preliminary result)   Collection Time: 06/20/14  2:08 PM  Result Value Ref Range Status   Specimen Description BLOOD RIGHT HAND  Final   Special Requests BOTTLES DRAWN AEROBIC AND ANAEROBIC 10 CC EACH  Final   Culture  Setup Time   Final    06/20/2014 23:28 Performed at [facility]    Culture   Final           BLOOD CULTURE RECEIVED NO  GROWTH TO DATE CULTURE WILL BE HELD FOR 5 DAYS BEFORE ISSUING A FINAL NEGATIVE REPORT Performed at [facility]    Report Status PENDING  Incomplete    Anti-infectives    Start     Dose/Rate Route Frequency Ordered Stop   06/20/14 1000  levofloxacin (LEVAQUIN) tablet 750 mg    Comments:  Levaquin 750 mg po q48h for CrCl < 30 mL/min    750 mg Oral Every 48 hours 06/19/14 0817     06/19/14 1300  vancomycin (VANCOCIN) 500 mg in sodium chloride 0.9 % 100 mL IVPB     500 mg100 mL/hr over 60 Minutes Intravenous Every 24 hours 06/18/14 1213     06/19/14 0830  levofloxacin (LEVAQUIN) IVPB 750 mg  Status:  Discontinued     750 mg100 mL/hr over 90 Minutes Intravenous Every 24 hours 06/19/14 0816 06/19/14 0817   06/18/14 1215  vancomycin (VANCOCIN) 500 mg in sodium chloride 0.9 % 100 mL IVPB     500 mg100 mL/hr over 60 Minutes Intravenous  Once 06/18/14 1213 06/18/14 1635   06/18/14 1130  levofloxacin (LEVAQUIN) IVPB 750 mg     750 mg100 mL/hr over 90 Minutes Intravenous  Once 06/18/14 1118 06/18/14 1314      Assessment: 80 yo F presented [facility] with low O2 sats and fevers.  Pt found to have strep bacteremia with 2/2 blood cx = viridans strep.  Sensitivities pending.  Pt reports a cephalosporin allergy and is on Vancomycin for this.  Pt also continues on Levaquin PO for HCAP coverage.    Renal function remains stable.  Pt is afebrile.  WBC wnl.  Goal of Therapy:  Vancomycin trough level 15-20 mcg/ml  Plan:  Continue Vancomycin 500 mg IV q24h.  Will await sensitivities (expected tomorrow) and possibility of narrowing therapy before checking Vancomycin trough. Continue to follow renal function, culture data, and LOT.   [employer], Pharm.D., BCPS Clinical Pharmacist Pager [phone] 06/21/2014 12:45 PM

## 2014-06-21 NOTE — Consult Note (Signed)
CARDIOLOGY CONSULT NOTE   Patient ID: Cynthia Hicks MRN: [id number], DOB/AGE: 04-11-1933   Admit date: 06/18/2014 Date of Consult: 06/21/2014   Primary Physician: Bonnita Nasuti, MD Primary Cardiologist: Dr. Aundra Dubin  Pt. Profile  80 year old Caucasian female with past medical history of hypertension, diabetes, PAF, CAD, autoimmune liver disease followed by [facility] on chronic prednisone and sirolimus, hyperlipidemia on no statin due to liver disease and chronic recurrent cellulitis of lower extremity presented for scheduled cardioversion and found to have RLE cellulitis and strep viridan bacteremia  Problem List  Past Medical History  Diagnosis Date  . Rapid heart rate 11/04/2010  . Hypertension   . Arthritis   . Diabetes mellitus   . Coronary artery disease 2007    A.  MI 01/2003;  B.  02/2006 NSTEMI - LAD 80-90m, LCX nl, RCA 12m - RCA stented w/ Taxus DES;   C.  2009 Neg MV;   D. 11/2010 Echo - EF 65%;  E. 08/2011 - nstemi, MV @ [facility] showed inflat isch and EF 37%  . Autoimmune liver disease     A.  Followed @ [facility] - on chronic prednisone and  sirolimus  . Hyperlipidemia     inability to stake statins due to liver disease  . Cellulitis     chronic cellulitis of LE  . Chronic kidney disease   . Bulging disc   . Sciatica   . Chronic back pain     A.  On Methadone  . Skin cancer (melanoma)   . Esophageal stricture     narrowing  . Irregular heart beat   . Myocardial infarction 2007 and  Jan. 2012  . Diabetic neuropathy   . Autoimmune liver disease   . Sepsis     Past Surgical History  Procedure Laterality Date  . Cardiac catheterization      Normal EF  . Coronary angioplasty  2007    2 DES placement to RCA complicated by a wire perforation  . Back surgery  1992  . Tubal ligation  1982  . Elbow surgery  1994    neuroma removed left  . Abscess drainage      Right side  . Spine surgery    . Lamenectomy  1989  . Heart stents  2007  and  2012    X's 1  and  2012  X's 2 stents  . Cardioversion N/A 10/31/2012    Procedure: CARDIOVERSION;  Surgeon: Larey Dresser, MD;  Location: [facility] ENDOSCOPY;  Service: Cardiovascular;  Laterality: N/A;     Allergies  Allergies  Allergen Reactions  . Cephalosporins     Blasted bone marrow  . Azathioprine     Other reaction(s): Unknown  . Cyclosporine   . Other     Other reaction(s): Other (See Comments) Uncoded Allergy. Allergen: Other Allergy: See Patient Chart for Details, Other Reaction: Toxicity    HPI   The patient is a 80 year old Caucasian female with past medical history of hypertension, diabetes, PAF, CAD, CKD, autoimmune liver disease followed by [facility] on chronic prednisone and sirolimus, hyperlipidemia on no statin due to liver disease and chronic recurrent cellulitis of lower extremity. Her last cardiac catheterization in 2013 showed 80% mid LAD stenosis, 99% mid left circumflex stenosis treated with stent. EF was 40-45% by echo and LV gram which improved to 55-60% on last Echo in 02/2013. Lexiscan Myoview in 09/2011 showed EF 49% with no ischemia. She was previously diagnosed with paroxysmal atrial fibrillation in December  2013. She was started on Coumadin at the time. She was subsequently cardioverted to sinus rhythm in March 2014. She was seen in early September 2015 and was noted when she went back to atrial fibrillation. Due to her labile INR on Coumadin, she was switched to low-dose Eliquis. According to the patient, she has been compliant with her medication at home. She denies any recent exertional chest discomfort, shortness breath, orthopnea or paroxysmal nocturnal dyspnea. Unfortunately, since May of this year, she has been admitted 4 times for recurrent lower extremity cellulitis. The last time she was admitted was in October at [facility]. She states, prior to May, she was fairly active and very independent at home.  Patient underwent a scheduled cardioversion on 06/18/2014 during which time,  she complained of significant lower back pain since her fall on 11/10. She was also noted to have significant right lower extremity erythema concerning for recurrent cellulitis. The procedure was canceled and she was sent to the ED and was eventually admitted to the internal medicine service. EKG obtained on 11/13 showed atrial fibrillation/atrial flutter with heart rate of 130s. She was also noted to be febrile. Chest x-ray showed mild patchy right basilar opacity likely atelectasis. Lumbar spine x-ray showed extensive degenerative disc disease, however no acute fracture. Blood culture was obtained and the patient was placed on an antibiotic. Blood cultures eventually returned positive for strep viridans. Cardiology service was consulted for atrial fibrillation management and potential endocarditis workup.   Inpatient Medications  . apixaban  2.5 mg Oral BID  . bisoprolol  5 mg Oral Daily  . calcium carbonate  1 tablet Oral BID WC  . cholecalciferol  2,000 Units Oral QHS  . ezetimibe  10 mg Oral Daily  . furosemide  40 mg Oral Daily  . gabapentin  100 mg Oral TID  . insulin aspart  0-9 Units Subcutaneous TID WC  . levofloxacin  750 mg Oral Q48H  . methadone  10 mg Oral BID  . omega-3 acid ethyl esters  1 g Oral BID  . pantoprazole  40 mg Oral Daily  . polyethylene glycol  17 g Oral Daily  . pravastatin  80 mg Oral QPM  . predniSONE  10 mg Oral Daily  . sirolimus  1 mg Oral QODAY  . vancomycin  500 mg Intravenous Q24H    Family History Family History  Problem Relation Age of Onset  . Lung cancer Mother 78  . Heart attack Father 54  . Heart disease Father   . Congestive Heart Failure Father   . Heart disease Maternal Grandfather   . Hypertension Brother   . Hypertension Son   . Pancreatic cancer Mother      Social History History   Social History  . Marital Status: Married    Spouse Name: John    Number of Children: 2  . Years of Education: N/A   Occupational History  .  real Sport and exercise psychologist     retired   Social History Main Topics  . Smoking status: Never Smoker   . Smokeless tobacco: Never Used  . Alcohol Use: No  . Drug Use: No  . Sexual Activity: Not on file   Other Topics Concern  . Not on file   Social History Narrative   Pt lives in [place] with husband.  She is retired.  She is not very active @ home.     Review of Systems  General:  No night sweats or weight changes.  +chills,  no objective fever Cardiovascular:  No chest pain, dyspnea on exertion, orthopnea, palpitations, paroxysmal nocturnal dyspnea. no cardiac awareness of a-fib Dermatological: No rash, lesions/masses RLE swelling and redness Respiratory: No cough, dyspnea Urologic: No hematuria, dysuria Abdominal:   No nausea, vomiting, diarrhea, bright red blood per rectum, melena, or hematemesis Neurologic:  No visual changes, wkns, changes in mental status. All other systems reviewed and are otherwise negative except as noted above.  Physical Exam  Blood pressure 137/62, pulse 70, temperature 98.3 F (36.8 C), temperature source Oral, resp. rate 18, height 5\' 4"  (1.626 m), weight 131 lb (59.421 kg), SpO2 100 %.  General: Pleasant, NAD Psych: Normal affect. Neuro: Alert and oriented X 3. Moves all extremities spontaneously. HEENT: Normal  Neck: Supple without bruits or JVD. Lungs:  Resp regular and unlabored, CTA. Heart: RRR no s3, s4, or murmurs. Abdomen: Soft, non-tender, non-distended, BS + x 4.  Extremities: No clubbing, cyanosis. DP/PT/Radials 2+ and equal bilaterally. RLE erythematous and swollen  Labs   Recent Labs  06/18/14 2057  TROPONINI <0.30   Lab Results  Component Value Date   WBC 9.7 06/20/2014   HGB 9.6* 06/20/2014   HCT 30.2* 06/20/2014   MCV 95.9 06/20/2014   PLT 125* 06/20/2014    Recent Labs Lab 06/18/14 1026  06/21/14 0310  NA 140  < > 143  K 4.5  < > 4.1  CL 98  < > 103  CO2 26  < > 28  BUN 37*  < > 35*  CREATININE 1.48*  < > 1.42*    CALCIUM 9.2  < > 9.1  PROT 6.7  --   --   BILITOT 0.7  --   --   ALKPHOS 80  --   --   ALT 16  --   --   AST 23  --   --   GLUCOSE 223*  < > 183*  < > = values in this interval not displayed. Lab Results  Component Value Date   CHOL 202* 05/27/2012   HDL 87.10 05/27/2012   LDLCALC 192 08/04/2009   TRIG 134.0 05/27/2012   No results found for: DDIMER  Radiology/Studies  Dg Lumbar Spine Complete  06/18/2014   CLINICAL DATA:  80 year old with chronic lower back pain, acutely worse today  EXAM: LUMBAR SPINE - COMPLETE 4+ VIEW  COMPARISON:  CT from 05/24/2014  FINDINGS: There are 5 lumbar type vertebral bodies.  Multilevel degenerative changes of the spine are present, evidenced by disc space narrowing, osteophyte formation and vacuum disc phenomena. Findings are most prominent at the L2-L3, L3-L4, L4-L5 and L5-S1 levels. The partially visualized lower thoracic spine is unremarkable. An unchanged endplate compression deformity of L3 is again noted.  There is no acute fracture or listhesis.  The bone mineralization is diminished.  The partially visualized abdomen demonstrates scattered vascular calcifications and a moderate amount of retained stool.  IMPRESSION: 1. Extensive lumbar spine degenerative disc disease. 2. No acute fracture or listhesis.   Electronically Signed   By: Rosemarie Ax   On: 06/18/2014 16:27   Ct Head Wo Contrast  06/18/2014   CLINICAL DATA:  80 year old recent fall and associated head injury  EXAM: CT HEAD WITHOUT CONTRAST  TECHNIQUE: Contiguous axial images were obtained from the base of the skull through the vertex without intravenous contrast.  COMPARISON:  None.  FINDINGS: There is diffuse, confluent, periventricular white matter hypodensity, compatible with chronic small vessel white matter disease.  There is no evidence of  an acute infarct.  There is no acute intracranial hemorrhage.  There is no midline shift or mass effect.  There is no extra-axial fluid  collection.  The paranasal sinuses and mastoid air cells are aerated.  The orbits are intact.  IMPRESSION: IMPRESSION  No acute intracranial hemorrhage.   Electronically Signed   By: Rosemarie Ax   On: 06/18/2014 16:14   Dg Chest Port 1 View  06/18/2014   CLINICAL DATA:  Fever, shortness of breath, chest pain x2 days  EXAM: PORTABLE CHEST - 1 VIEW  COMPARISON:  02/15/2014  FINDINGS: Eventration of the right hemidiaphragm. Mild patchy right basilar opacity, likely atelectasis. No pleural effusion or pneumothorax.  The heart is normal in size.  Old right proximal humeral fracture.  IMPRESSION: Mild patchy right basilar opacity, likely atelectasis.   Electronically Signed   By: Julian Hy M.D.   On: 06/18/2014 09:36    ECG  11/13 a-fib/a-flutter with RVR HR 130s  ASSESSMENT AND PLAN  1. Strep viridan bacteremia  - normally would require TEE, however pt has h/o possible esophageal stricture. She is pending workup and has frequent difficulty swallowing. Will discuss with Dr. [place] if need GI to see prior to TEE  2. RLE cellulitis  - recurrent 4th admission since May  - febrile on arrival, continue abx per IM  3. A-fib on eliquis  - planned cardioversion on 11/13 cancelled due to back pain, inability to lay flat and possible infection  - normally would not recommend cardioversion if endocarditis is a potential concern, however patient's telemetry appears regular with p waves for the past 24 hours, she appears to have self cardioverted since arrival. However moderate to high likelihood of going back to a-fib. Obtain EKG. Continue bisoprolol and eliquis  4. Recent fall and back pain, per IM  5. CAD  - last cath 2013 showed 80% mid LAD stenosis, 99% mid left circumflex stenosis treated with stent  - no recent anginal symptom  6. HTN 7. DM 8. autoimmune liver disease followed by [facility] on chronic prednisone and sirolimus 9. hyperlipidemia on no statin due to liver disease 10.  CKD  Signed, Almyra Deforest, PA-C 06/21/2014, 1:31 PM Patient seen and examined and history reviewed. Agree with above findings and plan. 80 yo WF with recent history of afib. Now converted to NSR on her own. No need for DCCV now. Would continue Eliquis and bisoprolol. She is now admitted with RLE cellulitis with multiple blood cultures positive for strep viridans. Patient immunocompromised with long term steroids. Agree that TEE indicated to rule out vegetations. No peripheral stigmata of SBE. No murmur. Patient does have a history of swallowing difficulties but states this is only intermittent and for larger solid boluses. I think it is reasonable to proceed with TEE. If there is difficulty passing probe then she will need GI evaluation. Will tentatively plan for tomorrow. Keep NPO after MN tonight. Procedure and risk reviewed with patient and she is agreeable to proceed.  Maksym Pfiffner [place], [place] 06/21/2014 2:28 PM

## 2014-06-21 NOTE — Progress Notes (Signed)
Physical Therapy Treatment Patient Details Name: Cynthia Hicks MRN: [id number] DOB: Aug 29, 1932 Today's Date: 06/21/2014    History of Present Illness Cynthia Hicks is a 80 year old woman with history of paroxysmal atrial fibrillation on Eliquis, HTN, DM2, CAD s/p DES x 2 to RCA in 2007 c/b wire perforation posterior left ventricular br that was coil embolized and DES to CFX in 09/8411, diastolic CHF, CKD, autoimmune hepatitis on prednisone and sirolimus, chronic venous insufficiency presenting with fever. She presented to the hospital today for scheduled cardioversion of her atrial fibrillation. She was noted to have temperature to 100.1. She notes that she has RLE redness that is similar to past cellulitis episodes she has had.    PT Comments    Patient progressing towards physical therapy goals, although continues to be limited due to lower back pain. SpO2 dropped to 87% while ambulating on room air, but maintains 96% at rest on room air. Requires min assist for transfers today. Patient will continue to benefit from skilled physical therapy services to further improve independence with functional mobility.   Follow Up Recommendations  SNF;Supervision for mobility/OOB     Equipment Recommendations  3in1 (PT)    Recommendations for Other Services OT consult     Precautions / Restrictions Precautions Precautions: Fall Restrictions Weight Bearing Restrictions: No    Mobility  Bed Mobility                  Transfers Overall transfer level: Needs assistance Equipment used: Rolling walker (2 wheeled) Transfers: Sit to/from Stand Sit to Stand: Min assist         General transfer comment: Min assist for boost to stand from reclining chair. VC for technique and hand placement. Cues for breathing  Ambulation/Gait Ambulation/Gait assistance: Min guard Ambulation Distance (Feet): 100 Feet Assistive device: Rolling walker (2 wheeled) Gait Pattern/deviations: Step-through  pattern;Decreased stride length;Trunk flexed   Gait velocity interpretation: Below normal speed for age/gender General Gait Details: Continues to ambulate slowly. Slightly improved posture from previous therapy session but still needs intermittent cues to correct. Pt mildy dyspneic and SpO2 down to 87% while on room air, (96% at rest on room air). HR elevated to 111 from 78. Close guard throughout distance for safety.   Stairs            Wheelchair Mobility    Modified Rankin (Stroke Patients Only)       Balance                                    Cognition Arousal/Alertness: Awake/alert Behavior During Therapy: WFL for tasks assessed/performed Overall Cognitive Status: Within Functional Limits for tasks assessed                      Exercises General Exercises - Lower Extremity Ankle Circles/Pumps: AROM;Both;10 reps;Seated Quad Sets: Strengthening;Both;10 reps;Seated Gluteal Sets: Strengthening;Both;10 reps;Seated Long Arc Quad: Strengthening;Both;10 reps;Seated Straight Leg Raises: Strengthening;Both;5 reps;Seated Hip Flexion/Marching: Strengthening;Both;10 reps;Seated    General Comments General comments (skin integrity, edema, etc.): Back pain remains greatest complaint. States ICE was helpful at previous visit. Reapplied today, RN notified.      Pertinent Vitals/Pain Pain Assessment: 0-10 Pain Score: 8  Pain Location: lower back, and Lt hip Pain Descriptors / Indicators:  ("hurts") Pain Intervention(s): Limited activity within patient's tolerance;Monitored during session;Repositioned;Ice applied    Home Living  Prior Function            PT Goals (current goals can now be found in the care plan section) Acute Rehab PT Goals Patient Stated Goal: Go home PT Goal Formulation: With patient Time For Goal Achievement: 07/03/14 Potential to Achieve Goals: Good Progress towards PT goals: Progressing toward  goals    Frequency  Min 2X/week    PT Plan Current plan remains appropriate    Co-evaluation             End of Session Equipment Utilized During Treatment: Gait belt Activity Tolerance: Patient limited by pain Patient left: in chair;with call bell/phone within reach     Time: 0925-0951 PT Time Calculation (min) (ACUTE ONLY): 26 min  Charges:  $Gait Training: 8-22 mins $Therapeutic Exercise: 8-22 mins                    G Codes:      [name] 07/20/2014, 10:20 AM  Elayne Snare, [place]

## 2014-06-21 NOTE — Progress Notes (Signed)
Medicare Important Message given? YES  (If response is "NO", the following Medicare IM given date fields will be blank)  Date Medicare IM given: 06/21/14 Medicare IM given by:  Dahlia Client [employer]

## 2014-06-21 NOTE — Progress Notes (Signed)
Subjective: Pt complaining of back pain that started the day she was admitted. She has hx of compression fracture and disc surgery. Lumbar xray on admission was negative for acute fracture.   Objective: Vital signs in last 24 hours: Filed Vitals:   06/20/14 0327 06/20/14 1440 06/20/14 2148 06/21/14 0505  BP: 131/67 104/52 136/56 137/62  Pulse: 71 73 70 70  Temp: 97.5 F (36.4 C) 97.5 F (36.4 C) 98.1 F (36.7 C) 98.3 F (36.8 C)  TempSrc: Oral Oral Oral Oral  Resp: 18 18 18 18   Height:      Weight:      SpO2: 99% 97% 100% 100%   Weight change:   Intake/Output Summary (Last 24 hours) at 06/21/14 1041 Last data filed at 06/21/14 0900  Gross per 24 hour  Intake    360 ml  Output   1801 ml  Net  -1441 ml   PE General: NAD, pleasant Lungs: CTAB, no wheezes Cardiac: irregular rate and rhythm, no murmurs GI: active bowel sounds, non tender to palpation, soft Ext: RLE erythematous and warm, improved from previous day. 1 inch in length open wound on LLE anterior shin w/o any erythema or pus; wound w/ stiches on knee healing well Neuro: CN 2-12 grossly intact  Lab Results: Basic Metabolic Panel:  Recent Labs Lab 06/20/14 0310 06/21/14 0310  NA 138 143  K 3.8 4.1  CL 98 103  CO2 26 28  GLUCOSE 178* 183*  BUN 39* 35*  CREATININE 1.59* 1.42*  CALCIUM 9.1 9.1   Liver Function Tests:  Recent Labs Lab 06/18/14 1026  AST 23  ALT 16  ALKPHOS 80  BILITOT 0.7  PROT 6.7  ALBUMIN 2.8*   CBC:  Recent Labs Lab 06/18/14 0926 06/18/14 1026 06/19/14 0515 06/20/14 0310  WBC 14.2* 11.1* 11.8* 9.7  NEUTROABS 10.5* 8.1*  --   --   HGB 11.3* 12.6 9.9* 9.6*  HCT 35.7* 39.4 31.8* 30.2*  MCV 94.4 93.8 97.0 95.9  PLT 150 143* 124* 125*   Cardiac Enzymes:  Recent Labs Lab 06/18/14 2057  TROPONINI <0.30   BNP:  Recent Labs Lab 06/14/14 1542  PROBNP 592.0*   CBG:  Recent Labs Lab 06/19/14 2107 06/20/14 0627 06/20/14 1212 06/20/14 1639 06/20/14 2149  06/21/14 0640  GLUCAP 233* 132* 178* 223* 294* 143*   Thyroid Function Tests:  Recent Labs Lab 06/18/14 2000  TSH 3.550    Micro Results: Recent Results (from the past 240 hour(s))  Culture, blood (routine x 2)     Status: None (Preliminary result)   Collection Time: 06/18/14 10:26 AM  Result Value Ref Range Status   Specimen Description BLOOD RIGHT ANTECUBITAL  Final   Special Requests BOTTLES DRAWN AEROBIC AND ANAEROBIC 5CC  Final   Culture  Setup Time   Final    06/18/2014 16:33 Performed at [facility]    Culture   Final    VIRIDANS STREPTOCOCCUS Note: Gram Stain Report Called to,Read Back By and Verified With: [facility] GRISSOM 06/19/14 AT 0420 [place] Performed at [facility]    Report Status PENDING  Incomplete  Culture, blood (routine x 2)     Status: None (Preliminary result)   Collection Time: 06/18/14 11:00 AM  Result Value Ref Range Status   Specimen Description BLOOD LEFT ANTECUBITAL  Final   Special Requests BOTTLES DRAWN AEROBIC AND ANAEROBIC 10CC  Final   Culture  Setup Time   Final    06/18/2014 16:33 Performed at  [facility]    Culture   Final    VIRIDANS STREPTOCOCCUS Note: Gram Stain Report Called to,Read Back By and Verified With: [facility] GRISSOM 06/19/14 AT 0420 [place] Performed at [facility]    Report Status PENDING  Incomplete  Urine culture     Status: None   Collection Time: 06/18/14 11:35 AM  Result Value Ref Range Status   Specimen Description URINE, CLEAN CATCH  Final   Special Requests NONE  Final   Culture  Setup Time   Final    06/18/2014 12:50 Performed at [facility]   Final    50,000 COLONIES/ML Performed at [facility]    Culture   Final    Multiple bacterial morphotypes present, none predominant. Suggest appropriate recollection if clinically indicated. Performed at [facility]    Report Status 06/19/2014 FINAL  Final  Culture, blood (routine x 2)      Status: None (Preliminary result)   Collection Time: 06/20/14  2:00 PM  Result Value Ref Range Status   Specimen Description BLOOD LEFT ARM  Final   Special Requests BOTTLES DRAWN AEROBIC AND ANAEROBIC 10 CC EACH  Final   Culture  Setup Time   Final    06/20/2014 23:28 Performed at [facility]    Culture   Final           BLOOD CULTURE RECEIVED NO GROWTH TO DATE CULTURE WILL BE HELD FOR 5 DAYS BEFORE ISSUING A FINAL NEGATIVE REPORT Performed at [facility]    Report Status PENDING  Incomplete  Culture, blood (routine x 2)     Status: None (Preliminary result)   Collection Time: 06/20/14  2:08 PM  Result Value Ref Range Status   Specimen Description BLOOD RIGHT HAND  Final   Special Requests BOTTLES DRAWN AEROBIC AND ANAEROBIC 10 CC EACH  Final   Culture  Setup Time   Final    06/20/2014 23:28 Performed at [facility]    Culture   Final           BLOOD CULTURE RECEIVED NO GROWTH TO DATE CULTURE WILL BE HELD FOR 5 DAYS BEFORE ISSUING A FINAL NEGATIVE REPORT Performed at [facility]    Report Status PENDING  Incomplete   Medications: I have reviewed the patient's current medications. Scheduled Meds: . apixaban  2.5 mg Oral BID  . bisoprolol  5 mg Oral Daily  . calcium carbonate  1 tablet Oral BID WC  . cholecalciferol  2,000 Units Oral QHS  . ezetimibe  10 mg Oral Daily  . furosemide  40 mg Oral Daily  . gabapentin  100 mg Oral TID  . insulin aspart  0-9 Units Subcutaneous TID WC  . levofloxacin  750 mg Oral Q48H  . methadone  10 mg Oral BID  . omega-3 acid ethyl esters  1 g Oral BID  . pantoprazole  40 mg Oral Daily  . polyethylene glycol  17 g Oral Daily  . pravastatin  80 mg Oral QPM  . predniSONE  10 mg Oral Daily  . sirolimus  1 mg Oral QODAY  . vancomycin  500 mg Intravenous Q24H   Continuous Infusions:  PRN Meds:.docusate sodium, HYDROcodone-acetaminophen, nitroGLYCERIN Assessment/Plan: Active Problems:   Diabetes  mellitus   HTN (hypertension)   Autoimmune hepatitis   Coronary artery disease   Chronic back pain   Chronic kidney disease   Atrial fibrillation   Lymphedema  Cellulitis of right lower extremity   Diabetes mellitus type 2, uncontrolled   Bacteremia due to strep viridans- likely source from cellulitis. And increased risk of infection, with immunocompromised state.  - Cont Vanc IV. - Cont Levaquin also as pt has HCAP. - Repeated blood cultures yesterday to confirm clearance of strep viridans    HCAP:  -Continue levofloxacin 750mg  PO every 48 hours  Fall, chronic back pain: lumbar xray on 11/13 negative for acute fractures, noted extensive lumbar spine degenerative disc disease - PT following -Continue home calcium carbonate 600mg  BID and vitamin D 2000u daily -Continue home gabapentin 100mg  TID -Norco 10/325mg  1 tablet Q6hr prn pain, may need extra doses for breakthrough -continue home methadone 10mg  BID  Atrial fibrillation on Eliquis: TSH wnl, troponin negative -will contact pt's cardiologist to see if they would like to cardiovert pt while admitted -Continue home Eliquis 2.5mg  BID -continue bisoprolol 5mg  daily  DM2 -Accuchecks q AC, HS -SSI  CAD, diastolic CHF: troponin negative -continue home Zetia 10mg  daily -continue home fish oil 1g BID -continue home nitrostat 0.4mg  Q54min prn chest pain -continue home pravastatin 80mg  daily - cont. Home lasix 40mg  po  HTN: normotensive -home losartan 25mg  daily held  CKD stage 3: Cr 1.42 today, previously 1.6 on 06/14/2014 -continue to monitor w/ BMET tomorrrow, will stop vanc if Cr trends up  Autoimmune hepatitis:  -continue home prednisone 10mg  daily -continue home sirolimus 1mg  every other day  FEN: Heart healthy/carb modified  DVT ppx: continue home Eliquis  Dispo: Disposition is deferred at this time, awaiting improvement of current medical problems.   The patient does have a current PCP (Imran Gertie Baron, MD)  and does not need an [facility] hospital follow-up appointment after discharge. .Services Needed at time of discharge: Y = Yes, Blank = No PT:   SNF and 3in1.   OT:   RN:   Equipment:   Other:     LOS: 3 days   Julious Oka, MD 06/21/2014, 10:41 AM

## 2014-06-21 NOTE — Progress Notes (Signed)
OT Cancellation Note  Patient Details Name: Cynthia Hicks MRN: [id number] DOB: March 26, 1933   Cancelled Treatment:    Reason Eval/Treat Not Completed: Other (comment) Pt is Medicare and current D/C plan is SNF. No apparent immediate acute care OT needs, therefore will defer OT to SNF. If OT eval is needed please call Acute Rehab Dept. at [phone] or text page OT at [phone].    Almon Register [phone] 06/21/2014, 7:17 AM

## 2014-06-21 NOTE — Progress Notes (Signed)
Utilization review completed.  

## 2014-06-21 NOTE — Clinical Social Work Psychosocial (Signed)
Clinical Social Work Department BRIEF PSYCHOSOCIAL ASSESSMENT 06/21/2014  Patient:  Cynthia Hicks, Cynthia Hicks     Account Number:  [id number]     Admit date:  06/18/2014  Clinical Social Worker:  Dian Queen  Date/Time:  06/21/2014 04:57 PM  Referred by:  Physician  Date Referred:  06/21/2014 Referred for  SNF Placement   Other Referral:   Interview type:  Patient Other interview type:    PSYCHOSOCIAL DATA Living Status:  ALONE Admitted from facility:   Level of care:   Primary support name:  [name] Primary support relationship to patient:  FAMILY Degree of support available:    CURRENT CONCERNS Current Concerns  Post-Acute Placement   Other Concerns:    SOCIAL WORK ASSESSMENT / PLAN Patient is a 80 year old female who lives on her own. Patient was alert and oriented x3.  Patient was talkative, and friendly.  Patient lives in [place] but has family in [place].  Patient stated she would rather not go to a SNF and would prefer to go home with home health once she is medically ready and discharge orders have been received. Patient did agree to go to a SNF in [place], or [place] depending on who has beds available.   Assessment/plan status:   Other assessment/ plan:   Information/referral to community resources:    PATIENT'S/FAMILY'S RESPONSE TO PLAN OF CARE: Patient in agreement to plan to discharge to SNF if recommended by PT.  Patient hopes she can discharge home with home health once medically ready and orders have been received.    Jones Broom. Basye, MSW, Person 06/21/2014 5:02 PM

## 2014-06-21 NOTE — Progress Notes (Signed)
    [facility] has been requested to perform a transesophageal echocardiogram on 06/22/2014 for strep viridan bacteremia.  After careful review of history and examination, the risks and benefits of transesophageal echocardiogram have been explained including risks of esophageal damage, perforation (1:10,000 risk), bleeding, pharyngeal hematoma as well as other potential complications associated with conscious sedation including aspiration, arrhythmia, respiratory failure and death. Alternatives to treatment were discussed, questions were answered. Patient is willing to proceed.   Almyra Deforest, PA-C 06/21/2014 2:27 PM

## 2014-06-22 ENCOUNTER — Encounter (HOSPITAL_COMMUNITY)
Admission: EM | Disposition: A | Discharge status: Skilled Nursing Facility | Payer: Self-pay | Source: Home / Self Care | Attending: Infectious Diseases

## 2014-06-22 ENCOUNTER — Encounter (HOSPITAL_COMMUNITY): Payer: Self-pay | Admitting: Gastroenterology

## 2014-06-22 DIAGNOSIS — N183 Chronic kidney disease, stage 3 (moderate): Secondary | ICD-10-CM

## 2014-06-22 DIAGNOSIS — I5033 Acute on chronic diastolic (congestive) heart failure: Secondary | ICD-10-CM

## 2014-06-22 DIAGNOSIS — J189 Pneumonia, unspecified organism: Secondary | ICD-10-CM

## 2014-06-22 DIAGNOSIS — I341 Nonrheumatic mitral (valve) prolapse: Secondary | ICD-10-CM

## 2014-06-22 HISTORY — PX: TEE WITHOUT CARDIOVERSION: SHX5443

## 2014-06-22 LAB — BASIC METABOLIC PANEL
Anion gap: 12 (ref 5–15)
BUN: 33 mg/dL — ABNORMAL HIGH (ref 6–23)
CALCIUM: 9.5 mg/dL (ref 8.4–10.5)
CO2: 28 mEq/L (ref 19–32)
CREATININE: 1.25 mg/dL — AB (ref 0.50–1.10)
Chloride: 103 mEq/L (ref 96–112)
GFR calc Af Amer: 46 mL/min — ABNORMAL LOW (ref >90)
GFR, EST NON AFRICAN AMERICAN: 40 mL/min — AB (ref >90)
GLUCOSE: 139 mg/dL — AB (ref 70–99)
Potassium: 4.2 mEq/L (ref 3.7–5.3)
SODIUM: 143 meq/L (ref 137–147)

## 2014-06-22 LAB — CULTURE, BLOOD (ROUTINE X 2)

## 2014-06-22 LAB — GLUCOSE, CAPILLARY
GLUCOSE-CAPILLARY: 272 mg/dL — AB (ref 70–99)
GLUCOSE-CAPILLARY: 265 mg/dL — AB (ref 70–99)
Glucose-Capillary: 135 mg/dL — ABNORMAL HIGH (ref 70–99)
Glucose-Capillary: 127 mg/dL — ABNORMAL HIGH (ref 70–99)

## 2014-06-22 SURGERY — ECHOCARDIOGRAM, TRANSESOPHAGEAL
Anesthesia: Moderate Sedation

## 2014-06-22 MED ORDER — BUTAMBEN-TETRACAINE-BENZOCAINE 2-2-14 % EX AERO
INHALATION_SPRAY | CUTANEOUS | Status: DC | PRN
  Administered 2014-06-22: 2 via TOPICAL

## 2014-06-22 MED ORDER — DIPHENHYDRAMINE HCL 50 MG/ML IJ SOLN
INTRAMUSCULAR | Status: AC
Start: 2014-06-22 — End: 2014-06-22
  Filled 2014-06-22: qty 1

## 2014-06-22 MED ORDER — MIDAZOLAM HCL 10 MG/2ML IJ SOLN
INTRAMUSCULAR | Status: DC | PRN
  Administered 2014-06-22: 1 mg via INTRAVENOUS
  Administered 2014-06-22: 2 mg via INTRAVENOUS

## 2014-06-22 MED ORDER — FENTANYL CITRATE 0.05 MG/ML IJ SOLN
INTRAMUSCULAR | Status: DC | PRN
  Administered 2014-06-22: 25 ug via INTRAVENOUS

## 2014-06-22 MED ORDER — CYCLOBENZAPRINE HCL 10 MG PO TABS
5.0000 mg | ORAL_TABLET | Freq: Three times a day (TID) | ORAL | Status: DC | PRN
  Administered 2014-06-23: 5 mg via ORAL
  Filled 2014-06-22: qty 1

## 2014-06-22 MED ORDER — MIDAZOLAM HCL 5 MG/ML IJ SOLN
INTRAMUSCULAR | Status: AC
  Filled 2014-06-22: qty 2

## 2014-06-22 MED ORDER — FUROSEMIDE 10 MG/ML IJ SOLN
40.0000 mg | Freq: Two times a day (BID) | INTRAMUSCULAR | Status: DC
  Administered 2014-06-22 – 2014-06-23 (×2): 40 mg via INTRAVENOUS
  Filled 2014-06-22 (×4): qty 4

## 2014-06-22 MED ORDER — FENTANYL CITRATE 0.05 MG/ML IJ SOLN
INTRAMUSCULAR | Status: AC
  Filled 2014-06-22: qty 2

## 2014-06-22 NOTE — Progress Notes (Signed)
Echocardiogram Echocardiogram Transesophageal has been performed.  Cynthia Hicks 06/22/2014, 1:47 PM

## 2014-06-22 NOTE — Progress Notes (Signed)
Pt received back into 2w10, pt alert and oriented assisted to chair Rickard Rhymes, RN

## 2014-06-22 NOTE — Progress Notes (Signed)
Subjective: Pt complaining of lower rt back pain that is minimally controlled w/ norco. She states norco brings pain down to 4/10. Pain gets as high as 7/10 in severity.   Objective: Vital signs in last 24 hours: Filed Vitals:   06/21/14 0505 06/21/14 1403 06/21/14 1952 06/22/14 0308  BP: 137/62 118/65 130/74 134/62  Pulse: 70 88 71 73  Temp: 98.3 F (36.8 C) 97.2 F (36.2 C) 97.6 F (36.4 C) 98.8 F (37.1 C)  TempSrc: Oral Oral Oral Oral  Resp: 18 17 18 18   Height:      Weight:      SpO2: 100% 94% 98% 94%   Weight change:   Intake/Output Summary (Last 24 hours) at 06/22/14 0742 Last data filed at 06/22/14 2595  Gross per 24 hour  Intake    360 ml  Output   2401 ml  Net  -2041 ml   PE General: NAD, pleasant Lungs: CTAB, no wheezes Cardiac: irregular rate and rhythm, no murmurs GI: active bowel sounds, non tender to palpation, soft Ext: RLE erythematous and warm, improved from previous day. 1 inch in length open wound on LLE anterior shin w/o any erythema or pus; wound w/ stiches on knee healing well Neuro: CN 2-12 grossly intact  Lab Results: Basic Metabolic Panel:  Recent Labs Lab 06/21/14 0310 06/22/14 0443  NA 143 143  K 4.1 4.2  CL 103 103  CO2 28 28  GLUCOSE 183* 139*  BUN 35* 33*  CREATININE 1.42* 1.25*  CALCIUM 9.1 9.5   Liver Function Tests:  Recent Labs Lab 06/18/14 1026  AST 23  ALT 16  ALKPHOS 80  BILITOT 0.7  PROT 6.7  ALBUMIN 2.8*   CBC:  Recent Labs Lab 06/18/14 0926 06/18/14 1026 06/19/14 0515 06/20/14 0310  WBC 14.2* 11.1* 11.8* 9.7  NEUTROABS 10.5* 8.1*  --   --   HGB 11.3* 12.6 9.9* 9.6*  HCT 35.7* 39.4 31.8* 30.2*  MCV 94.4 93.8 97.0 95.9  PLT 150 143* 124* 125*   Cardiac Enzymes:  Recent Labs Lab 06/18/14 2057  TROPONINI <0.30   CBG:  Recent Labs Lab 06/20/14 2149 06/21/14 0640 06/21/14 1125 06/21/14 1626 06/21/14 2121 06/22/14 0611  GLUCAP 294* 143* 176* 315* 241* 135*   Thyroid Function  Tests:  Recent Labs Lab 06/18/14 2000  TSH 3.550    Micro Results: Recent Results (from the past 240 hour(s))  Culture, blood (routine x 2)     Status: None   Collection Time: 06/18/14 10:26 AM  Result Value Ref Range Status   Specimen Description BLOOD RIGHT ANTECUBITAL  Final   Special Requests BOTTLES DRAWN AEROBIC AND ANAEROBIC 5CC  Final   Culture  Setup Time   Final    06/18/2014 16:33 Performed at [facility]    Culture   Final    VIRIDANS STREPTOCOCCUS Note: SUSCEPTIBILITIES PERFORMED ON PREVIOUS CULTURE WITHIN THE LAST 5 DAYS. Note: Gram Stain Report Called to,Read Back By and Verified With: [facility] GRISSOM 06/19/14 AT 0420 [place] Performed at [facility]    Report Status 06/22/2014 FINAL  Final  Culture, blood (routine x 2)     Status: None   Collection Time: 06/18/14 11:00 AM  Result Value Ref Range Status   Specimen Description BLOOD LEFT ANTECUBITAL  Final   Special Requests BOTTLES DRAWN AEROBIC AND ANAEROBIC 10CC  Final   Culture  Setup Time   Final    06/18/2014 16:33 Performed at [facility]  Culture   Final    VIRIDANS STREPTOCOCCUS Note: Gram Stain Report Called to,Read Back By and Verified With: [facility] GRISSOM 06/19/14 AT 0420 [place] Performed at [facility]    Report Status 06/22/2014 FINAL  Final   Organism ID, Bacteria VIRIDANS STREPTOCOCCUS  Final      Susceptibility   Viridans streptococcus - MIC (ETEST)*    CEFTRIAXONE 0.25 SENSITIVE Sensitive     LEVOFLOXACIN 1.0 SENSITIVE Sensitive     PENICILLIN 0.50 INTERMEDIATE Intermediate     * VIRIDANS STREPTOCOCCUS  Urine culture     Status: None   Collection Time: 06/18/14 11:35 AM  Result Value Ref Range Status   Specimen Description URINE, CLEAN CATCH  Final   Special Requests NONE  Final   Culture  Setup Time   Final    06/18/2014 12:50 Performed at [place]   Final    50,000 COLONIES/ML Performed at [facility]     Culture   Final    Multiple bacterial morphotypes present, none predominant. Suggest appropriate recollection if clinically indicated. Performed at [facility]    Report Status 06/19/2014 FINAL  Final  Culture, blood (routine x 2)     Status: None (Preliminary result)   Collection Time: 06/20/14  2:00 PM  Result Value Ref Range Status   Specimen Description BLOOD LEFT ARM  Final   Special Requests BOTTLES DRAWN AEROBIC AND ANAEROBIC 10 CC EACH  Final   Culture  Setup Time   Final    06/20/2014 23:28 Performed at [facility]    Culture   Final           BLOOD CULTURE RECEIVED NO GROWTH TO DATE CULTURE WILL BE HELD FOR 5 DAYS BEFORE ISSUING A FINAL NEGATIVE REPORT Performed at [facility]    Report Status PENDING  Incomplete  Culture, blood (routine x 2)     Status: None (Preliminary result)   Collection Time: 06/20/14  2:08 PM  Result Value Ref Range Status   Specimen Description BLOOD RIGHT HAND  Final   Special Requests BOTTLES DRAWN AEROBIC AND ANAEROBIC 10 CC EACH  Final   Culture  Setup Time   Final    06/20/2014 23:28 Performed at [facility]    Culture   Final           BLOOD CULTURE RECEIVED NO GROWTH TO DATE CULTURE WILL BE HELD FOR 5 DAYS BEFORE ISSUING A FINAL NEGATIVE REPORT Performed at [facility]    Report Status PENDING  Incomplete   Medications: I have reviewed the patient's current medications. Scheduled Meds: . apixaban  2.5 mg Oral BID  . bisoprolol  5 mg Oral Daily  . calcium carbonate  1 tablet Oral BID WC  . cholecalciferol  2,000 Units Oral QHS  . ezetimibe  10 mg Oral Daily  . furosemide  40 mg Oral Daily  . gabapentin  100 mg Oral TID  . insulin aspart  0-9 Units Subcutaneous TID WC  . levofloxacin  750 mg Oral Q48H  . methadone  10 mg Oral BID  . omega-3 acid ethyl esters  1 g Oral BID  . pantoprazole  40 mg Oral Daily  . polyethylene glycol  17 g Oral Daily  . pravastatin  80 mg Oral QPM  .  predniSONE  10 mg Oral Daily  . sirolimus  1 mg Oral QODAY  . vancomycin  500 mg Intravenous Q24H  Continuous Infusions: . sodium chloride 20 mL/hr (06/22/14 0419)   PRN Meds:.docusate sodium, HYDROcodone-acetaminophen, nitroGLYCERIN Assessment/Plan: Active Problems:   Diabetes mellitus   HTN (hypertension)   Autoimmune hepatitis   Coronary artery disease   Chronic back pain   Chronic kidney disease   Atrial fibrillation   Lymphedema   Cellulitis of right lower extremity   Diabetes mellitus type 2, uncontrolled   Bacteremia due to strep viridans- likely source from cellulitis. And increased risk of infection, with immunocompromised state.  - blood culture 11/13 reveal strep viridians sensitive to ceftriaxone and levo, will d/c vanc and cont PO levoquin - Repeat blood cultures 11/15 NGTD -cardiology following, will proceed TEE today to r/o endocarditis   HCAP:  - repeat CXR reveals increased opacity over rt lung base 2/2 small effusion and airspace disease -Continue levofloxacin 750mg  PO every 48 hours  Fall, chronic back pain: lumbar xray on 11/13 negative for acute fractures, noted extensive lumbar spine degenerative disc disease - PT following -Continue home calcium carbonate 600mg  BID and vitamin D 2000u daily -Continue home gabapentin 100mg  TID -Norco 10/325mg  1 tablet Q6hr prn pain, may need extra doses for breakthrough -continue home methadone 10mg  BID - pain located mainly at lower rt flank area likely MSK, flexeril 5mg  TID prn for muscle spasm  Atrial fibrillation on Eliquis: TSH wnl, troponin negative -pt in NSR -Continue home Eliquis 2.5mg  BID -continue bisoprolol 5mg  daily  DM2 -Accuchecks q AC, HS -SSI  CAD, diastolic CHF:  -continue home Zetia 10mg  daily -continue home fish oil 1g BID -continue home nitrostat 0.4mg  Q52min prn chest pain -continue home pravastatin 80mg  daily - cont. Home lasix 40mg  po  HTN: normotensive -home losartan 25mg  daily  held  CKD stage 3: Cr 1.42 today, previously 1.6 on 06/14/2014 -continue to monitor w/ BMET tomorrrow, will stop vanc if Cr trends up  Autoimmune hepatitis:  -continue home prednisone 10mg  daily -continue home sirolimus 1mg  every other day  FEN: Heart healthy/carb modified  DVT ppx: continue home Eliquis  Dispo: Disposition is deferred at this time, awaiting improvement of current medical problems.   The patient does have a current PCP (Imran Gertie Baron, MD) and does not need an [facility] hospital follow-up appointment after discharge. .Services Needed at time of discharge: Y = Yes, Blank = No PT:   SNF and 3in1.   OT:   RN:   Equipment:   Other:     LOS: 4 days   Julious Oka, MD 06/22/2014, 7:42 AM

## 2014-06-22 NOTE — H&P (View-Only) (Signed)
Subjective: Pt complaining of lower rt back pain that is minimally controlled w/ norco. She states norco brings pain down to 4/10. Pain gets as high as 7/10 in severity.   Objective: Vital signs in last 24 hours: Filed Vitals:   06/21/14 0505 06/21/14 1403 06/21/14 1952 06/22/14 0308  BP: 137/62 118/65 130/74 134/62  Pulse: 70 88 71 73  Temp: 98.3 F (36.8 C) 97.2 F (36.2 C) 97.6 F (36.4 C) 98.8 F (37.1 C)  TempSrc: Oral Oral Oral Oral  Resp: 18 17 18 18   Height:      Weight:      SpO2: 100% 94% 98% 94%   Weight change:   Intake/Output Summary (Last 24 hours) at 06/22/14 0742 Last data filed at 06/22/14 4656  Gross per 24 hour  Intake    360 ml  Output   2401 ml  Net  -2041 ml   PE General: NAD, pleasant Lungs: CTAB, no wheezes Cardiac: irregular rate and rhythm, no murmurs GI: active bowel sounds, non tender to palpation, soft Ext: RLE erythematous and warm, improved from previous day. 1 inch in length open wound on LLE anterior shin w/o any erythema or pus; wound w/ stiches on knee healing well Neuro: CN 2-12 grossly intact  Lab Results: Basic Metabolic Panel:  Recent Labs Lab 06/21/14 0310 06/22/14 0443  NA 143 143  K 4.1 4.2  CL 103 103  CO2 28 28  GLUCOSE 183* 139*  BUN 35* 33*  CREATININE 1.42* 1.25*  CALCIUM 9.1 9.5   Liver Function Tests:  Recent Labs Lab 06/18/14 1026  AST 23  ALT 16  ALKPHOS 80  BILITOT 0.7  PROT 6.7  ALBUMIN 2.8*   CBC:  Recent Labs Lab 06/18/14 0926 06/18/14 1026 06/19/14 0515 06/20/14 0310  WBC 14.2* 11.1* 11.8* 9.7  NEUTROABS 10.5* 8.1*  --   --   HGB 11.3* 12.6 9.9* 9.6*  HCT 35.7* 39.4 31.8* 30.2*  MCV 94.4 93.8 97.0 95.9  PLT 150 143* 124* 125*   Cardiac Enzymes:  Recent Labs Lab 06/18/14 2057  TROPONINI <0.30   CBG:  Recent Labs Lab 06/20/14 2149 06/21/14 0640 06/21/14 1125 06/21/14 1626 06/21/14 2121 06/22/14 0611  GLUCAP 294* 143* 176* 315* 241* 135*   Thyroid Function  Tests:  Recent Labs Lab 06/18/14 2000  TSH 3.550    Micro Results: Recent Results (from the past 240 hour(s))  Culture, blood (routine x 2)     Status: None   Collection Time: 06/18/14 10:26 AM  Result Value Ref Range Status   Specimen Description BLOOD RIGHT ANTECUBITAL  Final   Special Requests BOTTLES DRAWN AEROBIC AND ANAEROBIC 5CC  Final   Culture  Setup Time   Final    06/18/2014 16:33 Performed at [facility]    Culture   Final    VIRIDANS STREPTOCOCCUS Note: SUSCEPTIBILITIES PERFORMED ON PREVIOUS CULTURE WITHIN THE LAST 5 DAYS. Note: Gram Stain Report Called to,Read Back By and Verified With: [facility] GRISSOM 06/19/14 AT 0420 [place] Performed at [facility]    Report Status 06/22/2014 FINAL  Final  Culture, blood (routine x 2)     Status: None   Collection Time: 06/18/14 11:00 AM  Result Value Ref Range Status   Specimen Description BLOOD LEFT ANTECUBITAL  Final   Special Requests BOTTLES DRAWN AEROBIC AND ANAEROBIC 10CC  Final   Culture  Setup Time   Final    06/18/2014 16:33 Performed at [facility]  Culture   Final    VIRIDANS STREPTOCOCCUS Note: Gram Stain Report Called to,Read Back By and Verified With: [facility] GRISSOM 06/19/14 AT 0420 [place] Performed at [facility]    Report Status 06/22/2014 FINAL  Final   Organism ID, Bacteria VIRIDANS STREPTOCOCCUS  Final      Susceptibility   Viridans streptococcus - MIC (ETEST)*    CEFTRIAXONE 0.25 SENSITIVE Sensitive     LEVOFLOXACIN 1.0 SENSITIVE Sensitive     PENICILLIN 0.50 INTERMEDIATE Intermediate     * VIRIDANS STREPTOCOCCUS  Urine culture     Status: None   Collection Time: 06/18/14 11:35 AM  Result Value Ref Range Status   Specimen Description URINE, CLEAN CATCH  Final   Special Requests NONE  Final   Culture  Setup Time   Final    06/18/2014 12:50 Performed at [place]   Final    50,000 COLONIES/ML Performed at [facility]     Culture   Final    Multiple bacterial morphotypes present, none predominant. Suggest appropriate recollection if clinically indicated. Performed at [facility]    Report Status 06/19/2014 FINAL  Final  Culture, blood (routine x 2)     Status: None (Preliminary result)   Collection Time: 06/20/14  2:00 PM  Result Value Ref Range Status   Specimen Description BLOOD LEFT ARM  Final   Special Requests BOTTLES DRAWN AEROBIC AND ANAEROBIC 10 CC EACH  Final   Culture  Setup Time   Final    06/20/2014 23:28 Performed at [facility]    Culture   Final           BLOOD CULTURE RECEIVED NO GROWTH TO DATE CULTURE WILL BE HELD FOR 5 DAYS BEFORE ISSUING A FINAL NEGATIVE REPORT Performed at [facility]    Report Status PENDING  Incomplete  Culture, blood (routine x 2)     Status: None (Preliminary result)   Collection Time: 06/20/14  2:08 PM  Result Value Ref Range Status   Specimen Description BLOOD RIGHT HAND  Final   Special Requests BOTTLES DRAWN AEROBIC AND ANAEROBIC 10 CC EACH  Final   Culture  Setup Time   Final    06/20/2014 23:28 Performed at [facility]    Culture   Final           BLOOD CULTURE RECEIVED NO GROWTH TO DATE CULTURE WILL BE HELD FOR 5 DAYS BEFORE ISSUING A FINAL NEGATIVE REPORT Performed at [facility]    Report Status PENDING  Incomplete   Medications: I have reviewed the patient's current medications. Scheduled Meds: . apixaban  2.5 mg Oral BID  . bisoprolol  5 mg Oral Daily  . calcium carbonate  1 tablet Oral BID WC  . cholecalciferol  2,000 Units Oral QHS  . ezetimibe  10 mg Oral Daily  . furosemide  40 mg Oral Daily  . gabapentin  100 mg Oral TID  . insulin aspart  0-9 Units Subcutaneous TID WC  . levofloxacin  750 mg Oral Q48H  . methadone  10 mg Oral BID  . omega-3 acid ethyl esters  1 g Oral BID  . pantoprazole  40 mg Oral Daily  . polyethylene glycol  17 g Oral Daily  . pravastatin  80 mg Oral QPM  .  predniSONE  10 mg Oral Daily  . sirolimus  1 mg Oral QODAY  . vancomycin  500 mg Intravenous Q24H  Continuous Infusions: . sodium chloride 20 mL/hr (06/22/14 0419)   PRN Meds:.docusate sodium, HYDROcodone-acetaminophen, nitroGLYCERIN Assessment/Plan: Active Problems:   Diabetes mellitus   HTN (hypertension)   Autoimmune hepatitis   Coronary artery disease   Chronic back pain   Chronic kidney disease   Atrial fibrillation   Lymphedema   Cellulitis of right lower extremity   Diabetes mellitus type 2, uncontrolled   Bacteremia due to strep viridans- likely source from cellulitis. And increased risk of infection, with immunocompromised state.  - blood culture 11/13 reveal strep viridians sensitive to ceftriaxone and levo, will d/c vanc and cont PO levoquin - Repeat blood cultures 11/15 NGTD -cardiology following, will proceed TEE today to r/o endocarditis   HCAP:  - repeat CXR reveals increased opacity over rt lung base 2/2 small effusion and airspace disease -Continue levofloxacin 750mg  PO every 48 hours  Fall, chronic back pain: lumbar xray on 11/13 negative for acute fractures, noted extensive lumbar spine degenerative disc disease - PT following -Continue home calcium carbonate 600mg  BID and vitamin D 2000u daily -Continue home gabapentin 100mg  TID -Norco 10/325mg  1 tablet Q6hr prn pain, may need extra doses for breakthrough -continue home methadone 10mg  BID - pain located mainly at lower rt flank area likely MSK, flexeril 5mg  TID prn for muscle spasm  Atrial fibrillation on Eliquis: TSH wnl, troponin negative -pt in NSR -Continue home Eliquis 2.5mg  BID -continue bisoprolol 5mg  daily  DM2 -Accuchecks q AC, HS -SSI  CAD, diastolic CHF:  -continue home Zetia 10mg  daily -continue home fish oil 1g BID -continue home nitrostat 0.4mg  Q48min prn chest pain -continue home pravastatin 80mg  daily - cont. Home lasix 40mg  po  HTN: normotensive -home losartan 25mg  daily  held  CKD stage 3: Cr 1.42 today, previously 1.6 on 06/14/2014 -continue to monitor w/ BMET tomorrrow, will stop vanc if Cr trends up  Autoimmune hepatitis:  -continue home prednisone 10mg  daily -continue home sirolimus 1mg  every other day  FEN: Heart healthy/carb modified  DVT ppx: continue home Eliquis  Dispo: Disposition is deferred at this time, awaiting improvement of current medical problems.   The patient does have a current PCP (Imran Gertie Baron, MD) and does not need an [facility] hospital follow-up appointment after discharge. .Services Needed at time of discharge: Y = Yes, Blank = No PT:   SNF and 3in1.   OT:   RN:   Equipment:   Other:     LOS: 4 days   Julious Oka, MD 06/22/2014, 7:42 AM

## 2014-06-22 NOTE — Progress Notes (Addendum)
Pt seen and examined with Dr. Hulen Luster. Please refer to resident note for details. Pt with persistent back pain but no other complaints at this time Pt still with R LE erythema and increased warmth but improved from yesterday.  Strep Viridans bacteremia: - Pt to get TEE today for endocarditis work up - c/w vanco for now - Repeat blood cx negative till date  HCAP: - Will c/w levaquin for now - repeat CXR noted  Back pain: - likely MSK in origin given location in R flank - c/w pain control - Will start flexeril - If persists will consider further w/u - possible sono to r/o abscess

## 2014-06-22 NOTE — Interval H&P Note (Signed)
History and Physical Interval Note:  06/22/2014 1:10 PM  [name]  has presented today for surgery, with the diagnosis of POSITIVE BLOOD CULTURE   The various methods of treatment have been discussed with the patient and family. After consideration of risks, benefits and other options for treatment, the patient has consented to  Procedure(s): TRANSESOPHAGEAL ECHOCARDIOGRAM (TEE) (N/A) as a surgical intervention .  The patient's history has been reviewed, patient examined, no change in status, stable for surgery.  I have reviewed the patient's chart and labs.  Questions were answered to the patient's satisfaction.     Marquise Lambson [employer]

## 2014-06-22 NOTE — CV Procedure (Signed)
Procedure: TEE  Indication: Assess for endocarditis  Sedation: Versed 3 mg IV, Fentanyl 25 mcg IV  Findings: Please see echo section for full report.  Normal LV size and systolic function, EF 91-47%.  Normal RV size and systolic function.  Mild mitral regurgitation.  Trileaflet aortic valve with trivial regurgitation and no stenosis, moderately calcified.  The left atrium was mildly dilated.  The left atrial appendage was large but no thrombus was seen.  Negative bubble study so no ASD or PFO.  There was prominent (grade IV) plaque in the descending thoracic aorta.  No vegetation was seen on any valve.   There was no evidence for endocarditis.   [name] 06/22/2014 1:29 PM

## 2014-06-22 NOTE — Interval H&P Note (Signed)
History and Physical Interval Note:  06/22/2014 8:08 AM  Cynthia Hicks  has presented today for surgery, with the diagnosis of POSITIVE BLOOD CULTURE   The various methods of treatment have been discussed with the patient and family. After consideration of risks, benefits and other options for treatment, the patient has consented to  Procedure(s): TRANSESOPHAGEAL ECHOCARDIOGRAM (TEE) (N/A) as a surgical intervention .  The patient's history has been reviewed, patient examined, no change in status, stable for surgery.  I have reviewed the patient's chart and labs.  Questions were answered to the patient's satisfaction.     Dorothy Spark

## 2014-06-22 NOTE — CV Procedure (Signed)
Entered in error

## 2014-06-22 NOTE — H&P (View-Only) (Signed)
CARDIOLOGY CONSULT NOTE   Patient ID: Cynthia Hicks MRN: [id number], DOB/AGE: February 04, 1933   Admit date: 06/18/2014 Date of Consult: 06/21/2014   Primary Physician: Bonnita Nasuti, MD Primary Cardiologist: Dr. Aundra Dubin  Pt. Profile  80 year old Caucasian female with past medical history of hypertension, diabetes, PAF, CAD, autoimmune liver disease followed by [facility] on chronic prednisone and sirolimus, hyperlipidemia on no statin due to liver disease and chronic recurrent cellulitis of lower extremity presented for scheduled cardioversion and found to have RLE cellulitis and strep viridan bacteremia  Problem List  Past Medical History  Diagnosis Date  . Rapid heart rate 11/04/2010  . Hypertension   . Arthritis   . Diabetes mellitus   . Coronary artery disease 2007    A.  MI 01/2003;  B.  02/2006 NSTEMI - LAD 80-41m, LCX nl, RCA 78m - RCA stented w/ Taxus DES;   C.  2009 Neg MV;   D. 11/2010 Echo - EF 65%;  E. 08/2011 - nstemi, MV @ [facility] showed inflat isch and EF 37%  . Autoimmune liver disease     A.  Followed @ [facility] - on chronic prednisone and  sirolimus  . Hyperlipidemia     inability to stake statins due to liver disease  . Cellulitis     chronic cellulitis of LE  . Chronic kidney disease   . Bulging disc   . Sciatica   . Chronic back pain     A.  On Methadone  . Skin cancer (melanoma)   . Esophageal stricture     narrowing  . Irregular heart beat   . Myocardial infarction 2007 and  Jan. 2012  . Diabetic neuropathy   . Autoimmune liver disease   . Sepsis     Past Surgical History  Procedure Laterality Date  . Cardiac catheterization      Normal EF  . Coronary angioplasty  2007    2 DES placement to RCA complicated by a wire perforation  . Back surgery  1992  . Tubal ligation  1982  . Elbow surgery  1994    neuroma removed left  . Abscess drainage      Right side  . Spine surgery    . Lamenectomy  1989  . Heart stents  2007  and  2012    X's 1  and  2012  X's 2 stents  . Cardioversion N/A 10/31/2012    Procedure: CARDIOVERSION;  Surgeon: Larey Dresser, MD;  Location: [facility] ENDOSCOPY;  Service: Cardiovascular;  Laterality: N/A;     Allergies  Allergies  Allergen Reactions  . Cephalosporins     Blasted bone marrow  . Azathioprine     Other reaction(s): Unknown  . Cyclosporine   . Other     Other reaction(s): Other (See Comments) Uncoded Allergy. Allergen: Other Allergy: See Patient Chart for Details, Other Reaction: Toxicity    HPI   The patient is a 80 year old Caucasian female with past medical history of hypertension, diabetes, PAF, CAD, CKD, autoimmune liver disease followed by [facility] on chronic prednisone and sirolimus, hyperlipidemia on no statin due to liver disease and chronic recurrent cellulitis of lower extremity. Her last cardiac catheterization in 2013 showed 80% mid LAD stenosis, 99% mid left circumflex stenosis treated with stent. EF was 40-45% by echo and LV gram which improved to 55-60% on last Echo in 02/2013. Lexiscan Myoview in 09/2011 showed EF 49% with no ischemia. She was previously diagnosed with paroxysmal atrial fibrillation in December  2013. She was started on Coumadin at the time. She was subsequently cardioverted to sinus rhythm in March 2014. She was seen in early September 2015 and was noted when she went back to atrial fibrillation. Due to her labile INR on Coumadin, she was switched to low-dose Eliquis. According to the patient, she has been compliant with her medication at home. She denies any recent exertional chest discomfort, shortness breath, orthopnea or paroxysmal nocturnal dyspnea. Unfortunately, since May of this year, she has been admitted 4 times for recurrent lower extremity cellulitis. The last time she was admitted was in October at [facility]. She states, prior to May, she was fairly active and very independent at home.  Patient underwent a scheduled cardioversion on 06/18/2014 during which time,  she complained of significant lower back pain since her fall on 11/10. She was also noted to have significant right lower extremity erythema concerning for recurrent cellulitis. The procedure was canceled and she was sent to the ED and was eventually admitted to the internal medicine service. EKG obtained on 11/13 showed atrial fibrillation/atrial flutter with heart rate of 130s. She was also noted to be febrile. Chest x-ray showed mild patchy right basilar opacity likely atelectasis. Lumbar spine x-ray showed extensive degenerative disc disease, however no acute fracture. Blood culture was obtained and the patient was placed on an antibiotic. Blood cultures eventually returned positive for strep viridans. Cardiology service was consulted for atrial fibrillation management and potential endocarditis workup.   Inpatient Medications  . apixaban  2.5 mg Oral BID  . bisoprolol  5 mg Oral Daily  . calcium carbonate  1 tablet Oral BID WC  . cholecalciferol  2,000 Units Oral QHS  . ezetimibe  10 mg Oral Daily  . furosemide  40 mg Oral Daily  . gabapentin  100 mg Oral TID  . insulin aspart  0-9 Units Subcutaneous TID WC  . levofloxacin  750 mg Oral Q48H  . methadone  10 mg Oral BID  . omega-3 acid ethyl esters  1 g Oral BID  . pantoprazole  40 mg Oral Daily  . polyethylene glycol  17 g Oral Daily  . pravastatin  80 mg Oral QPM  . predniSONE  10 mg Oral Daily  . sirolimus  1 mg Oral QODAY  . vancomycin  500 mg Intravenous Q24H    Family History Family History  Problem Relation Age of Onset  . Lung cancer Mother 68  . Heart attack Father 72  . Heart disease Father   . Congestive Heart Failure Father   . Heart disease Maternal Grandfather   . Hypertension Brother   . Hypertension Son   . Pancreatic cancer Mother      Social History History   Social History  . Marital Status: Married    Spouse Name: John    Number of Children: 2  . Years of Education: N/A   Occupational History  .  real Sport and exercise psychologist     retired   Social History Main Topics  . Smoking status: Never Smoker   . Smokeless tobacco: Never Used  . Alcohol Use: No  . Drug Use: No  . Sexual Activity: Not on file   Other Topics Concern  . Not on file   Social History Narrative   Pt lives in [place] with husband.  She is retired.  She is not very active @ home.     Review of Systems  General:  No night sweats or weight changes.  +chills,  no objective fever Cardiovascular:  No chest pain, dyspnea on exertion, orthopnea, palpitations, paroxysmal nocturnal dyspnea. no cardiac awareness of a-fib Dermatological: No rash, lesions/masses RLE swelling and redness Respiratory: No cough, dyspnea Urologic: No hematuria, dysuria Abdominal:   No nausea, vomiting, diarrhea, bright red blood per rectum, melena, or hematemesis Neurologic:  No visual changes, wkns, changes in mental status. All other systems reviewed and are otherwise negative except as noted above.  Physical Exam  Blood pressure 137/62, pulse 70, temperature 98.3 F (36.8 C), temperature source Oral, resp. rate 18, height 5\' 4"  (1.626 m), weight 131 lb (59.421 kg), SpO2 100 %.  General: Pleasant, NAD Psych: Normal affect. Neuro: Alert and oriented X 3. Moves all extremities spontaneously. HEENT: Normal  Neck: Supple without bruits or JVD. Lungs:  Resp regular and unlabored, CTA. Heart: RRR no s3, s4, or murmurs. Abdomen: Soft, non-tender, non-distended, BS + x 4.  Extremities: No clubbing, cyanosis. DP/PT/Radials 2+ and equal bilaterally. RLE erythematous and swollen  Labs   Recent Labs  06/18/14 2057  TROPONINI <0.30   Lab Results  Component Value Date   WBC 9.7 06/20/2014   HGB 9.6* 06/20/2014   HCT 30.2* 06/20/2014   MCV 95.9 06/20/2014   PLT 125* 06/20/2014    Recent Labs Lab 06/18/14 1026  06/21/14 0310  NA 140  < > 143  K 4.5  < > 4.1  CL 98  < > 103  CO2 26  < > 28  BUN 37*  < > 35*  CREATININE 1.48*  < > 1.42*    CALCIUM 9.2  < > 9.1  PROT 6.7  --   --   BILITOT 0.7  --   --   ALKPHOS 80  --   --   ALT 16  --   --   AST 23  --   --   GLUCOSE 223*  < > 183*  < > = values in this interval not displayed. Lab Results  Component Value Date   CHOL 202* 05/27/2012   HDL 87.10 05/27/2012   LDLCALC 192 08/04/2009   TRIG 134.0 05/27/2012   No results found for: DDIMER  Radiology/Studies  Dg Lumbar Spine Complete  06/18/2014   CLINICAL DATA:  80 year old with chronic lower back pain, acutely worse today  EXAM: LUMBAR SPINE - COMPLETE 4+ VIEW  COMPARISON:  CT from 05/24/2014  FINDINGS: There are 5 lumbar type vertebral bodies.  Multilevel degenerative changes of the spine are present, evidenced by disc space narrowing, osteophyte formation and vacuum disc phenomena. Findings are most prominent at the L2-L3, L3-L4, L4-L5 and L5-S1 levels. The partially visualized lower thoracic spine is unremarkable. An unchanged endplate compression deformity of L3 is again noted.  There is no acute fracture or listhesis.  The bone mineralization is diminished.  The partially visualized abdomen demonstrates scattered vascular calcifications and a moderate amount of retained stool.  IMPRESSION: 1. Extensive lumbar spine degenerative disc disease. 2. No acute fracture or listhesis.   Electronically Signed   By: Rosemarie Ax   On: 06/18/2014 16:27   Ct Head Wo Contrast  06/18/2014   CLINICAL DATA:  80 year old recent fall and associated head injury  EXAM: CT HEAD WITHOUT CONTRAST  TECHNIQUE: Contiguous axial images were obtained from the base of the skull through the vertex without intravenous contrast.  COMPARISON:  None.  FINDINGS: There is diffuse, confluent, periventricular white matter hypodensity, compatible with chronic small vessel white matter disease.  There is no evidence of  an acute infarct.  There is no acute intracranial hemorrhage.  There is no midline shift or mass effect.  There is no extra-axial fluid  collection.  The paranasal sinuses and mastoid air cells are aerated.  The orbits are intact.  IMPRESSION: IMPRESSION  No acute intracranial hemorrhage.   Electronically Signed   By: Rosemarie Ax   On: 06/18/2014 16:14   Dg Chest Port 1 View  06/18/2014   CLINICAL DATA:  Fever, shortness of breath, chest pain x2 days  EXAM: PORTABLE CHEST - 1 VIEW  COMPARISON:  02/15/2014  FINDINGS: Eventration of the right hemidiaphragm. Mild patchy right basilar opacity, likely atelectasis. No pleural effusion or pneumothorax.  The heart is normal in size.  Old right proximal humeral fracture.  IMPRESSION: Mild patchy right basilar opacity, likely atelectasis.   Electronically Signed   By: Julian Hy M.D.   On: 06/18/2014 09:36    ECG  11/13 a-fib/a-flutter with RVR HR 130s  ASSESSMENT AND PLAN  1. Strep viridan bacteremia  - normally would require TEE, however pt has h/o possible esophageal stricture. She is pending workup and has frequent difficulty swallowing. Will discuss with Dr. [place] if need GI to see prior to TEE  2. RLE cellulitis  - recurrent 4th admission since May  - febrile on arrival, continue abx per IM  3. A-fib on eliquis  - planned cardioversion on 11/13 cancelled due to back pain, inability to lay flat and possible infection  - normally would not recommend cardioversion if endocarditis is a potential concern, however patient's telemetry appears regular with p waves for the past 24 hours, she appears to have self cardioverted since arrival. However moderate to high likelihood of going back to a-fib. Obtain EKG. Continue bisoprolol and eliquis  4. Recent fall and back pain, per IM  5. CAD  - last cath 2013 showed 80% mid LAD stenosis, 99% mid left circumflex stenosis treated with stent  - no recent anginal symptom  6. HTN 7. DM 8. autoimmune liver disease followed by [facility] on chronic prednisone and sirolimus 9. hyperlipidemia on no statin due to liver disease 10.  CKD  Signed, Almyra Deforest, PA-C 06/21/2014, 1:31 PM Patient seen and examined and history reviewed. Agree with above findings and plan. 80 yo WF with recent history of afib. Now converted to NSR on her own. No need for DCCV now. Would continue Eliquis and bisoprolol. She is now admitted with RLE cellulitis with multiple blood cultures positive for strep viridans. Patient immunocompromised with long term steroids. Agree that TEE indicated to rule out vegetations. No peripheral stigmata of SBE. No murmur. Patient does have a history of swallowing difficulties but states this is only intermittent and for larger solid boluses. I think it is reasonable to proceed with TEE. If there is difficulty passing probe then she will need GI evaluation. Will tentatively plan for tomorrow. Keep NPO after MN tonight. Procedure and risk reviewed with patient and she is agreeable to proceed.  Avalyn Molino [place], [place] 06/21/2014 2:28 PM

## 2014-06-22 NOTE — Progress Notes (Signed)
Patient ID: Cynthia Hicks, female   DOB: 02-23-1933, 80 y.o.   MRN: [id number]   SUBJECTIVE: Patient's main complaint is back pain.  She remains in NSR.  She reports some dyspnea.    Scheduled Meds: . apixaban  2.5 mg Oral BID  . bisoprolol  5 mg Oral Daily  . calcium carbonate  1 tablet Oral BID WC  . cholecalciferol  2,000 Units Oral QHS  . ezetimibe  10 mg Oral Daily  . furosemide  40 mg Intravenous BID  . gabapentin  100 mg Oral TID  . insulin aspart  0-9 Units Subcutaneous TID WC  . levofloxacin  750 mg Oral Q48H  . methadone  10 mg Oral BID  . omega-3 acid ethyl esters  1 g Oral BID  . pantoprazole  40 mg Oral Daily  . polyethylene glycol  17 g Oral Daily  . pravastatin  80 mg Oral QPM  . predniSONE  10 mg Oral Daily  . sirolimus  1 mg Oral QODAY  . vancomycin  500 mg Intravenous Q24H   Continuous Infusions: . sodium chloride 500 mL (06/22/14 1215)   PRN Meds:.cyclobenzaprine, docusate sodium, HYDROcodone-acetaminophen, nitroGLYCERIN    Filed Vitals:   06/22/14 1332 06/22/14 1340 06/22/14 1350 06/22/14 1420  BP: 115/51 124/55 118/42 135/79  Pulse: 65 63  68  Temp:    97.6 F (36.4 C)  TempSrc:      Resp: 15 16  18   Height:      Weight:      SpO2: 94% 94%  97%    Intake/Output Summary (Last 24 hours) at 06/22/14 1650 Last data filed at 06/22/14 1400  Gross per 24 hour  Intake    120 ml  Output   2000 ml  Net  -1880 ml    LABS: Basic Metabolic Panel:  Recent Labs  06/21/14 0310 06/22/14 0443  NA 143 143  K 4.1 4.2  CL 103 103  CO2 28 28  GLUCOSE 183* 139*  BUN 35* 33*  CREATININE 1.42* 1.25*  CALCIUM 9.1 9.5   Liver Function Tests: No results for input(s): AST, ALT, ALKPHOS, BILITOT, PROT, ALBUMIN in the last 72 hours. No results for input(s): LIPASE, AMYLASE in the last 72 hours. CBC:  Recent Labs  06/20/14 0310  WBC 9.7  HGB 9.6*  HCT 30.2*  MCV 95.9  PLT 125*   Cardiac Enzymes: No results for input(s): CKTOTAL, CKMB, CKMBINDEX,  TROPONINI in the last 72 hours. BNP: Invalid input(s): POCBNP D-Dimer: No results for input(s): DDIMER in the last 72 hours. Hemoglobin A1C: No results for input(s): HGBA1C in the last 72 hours. Fasting Lipid Panel: No results for input(s): CHOL, HDL, LDLCALC, TRIG, CHOLHDL, LDLDIRECT in the last 72 hours. Thyroid Function Tests: No results for input(s): TSH, T4TOTAL, T3FREE, THYROIDAB in the last 72 hours.  Invalid input(s): FREET3 Anemia Panel: No results for input(s): VITAMINB12, FOLATE, FERRITIN, TIBC, IRON, RETICCTPCT in the last 72 hours.  RADIOLOGY: Dg Chest 2 View  06/21/2014   CLINICAL DATA:  Subsequent encounter for pneumonia  EXAM: CHEST  2 VIEW  COMPARISON:  06/18/2014  FINDINGS: Mild cardiac enlargement stable. Vascular pattern normal. Left lung clear.  Increased opacity over the right lung base. This appears to be related to a small effusion with underlying airspace disease.  IMPRESSION: Increased opacity over the right lung base constituting a combination of small effusion and underlying airspace disease.   Electronically Signed   By: Elodia Florence.D.  On: 06/21/2014 16:26   Dg Lumbar Spine Complete  06/18/2014   CLINICAL DATA:  81 year old with chronic lower back pain, acutely worse today  EXAM: LUMBAR SPINE - COMPLETE 4+ VIEW  COMPARISON:  CT from 05/24/2014  FINDINGS: There are 5 lumbar type vertebral bodies.  Multilevel degenerative changes of the spine are present, evidenced by disc space narrowing, osteophyte formation and vacuum disc phenomena. Findings are most prominent at the L2-L3, L3-L4, L4-L5 and L5-S1 levels. The partially visualized lower thoracic spine is unremarkable. An unchanged endplate compression deformity of L3 is again noted.  There is no acute fracture or listhesis.  The bone mineralization is diminished.  The partially visualized abdomen demonstrates scattered vascular calcifications and a moderate amount of retained stool.  IMPRESSION: 1.  Extensive lumbar spine degenerative disc disease. 2. No acute fracture or listhesis.   Electronically Signed   By: Rosemarie Ax   On: 06/18/2014 16:27   Ct Head Wo Contrast  06/18/2014   CLINICAL DATA:  80 year old recent fall and associated head injury  EXAM: CT HEAD WITHOUT CONTRAST  TECHNIQUE: Contiguous axial images were obtained from the base of the skull through the vertex without intravenous contrast.  COMPARISON:  None.  FINDINGS: There is diffuse, confluent, periventricular white matter hypodensity, compatible with chronic small vessel white matter disease.  There is no evidence of an acute infarct.  There is no acute intracranial hemorrhage.  There is no midline shift or mass effect.  There is no extra-axial fluid collection.  The paranasal sinuses and mastoid air cells are aerated.  The orbits are intact.  IMPRESSION: IMPRESSION  No acute intracranial hemorrhage.   Electronically Signed   By: Rosemarie Ax   On: 06/18/2014 16:14   Dg Chest Port 1 View  06/18/2014   CLINICAL DATA:  Fever, shortness of breath, chest pain x2 days  EXAM: PORTABLE CHEST - 1 VIEW  COMPARISON:  02/15/2014  FINDINGS: Eventration of the right hemidiaphragm. Mild patchy right basilar opacity, likely atelectasis. No pleural effusion or pneumothorax.  The heart is normal in size.  Old right proximal humeral fracture.  IMPRESSION: Mild patchy right basilar opacity, likely atelectasis.   Electronically Signed   By: Julian Hy M.D.   On: 06/18/2014 09:36    PHYSICAL EXAM General: NAD Neck: JVP 10 cm, no thyromegaly or thyroid nodule.  Lungs: Decreased breath sounds right base.  CV: Nondisplaced PMI.  Heart regular S1/S2, no S3/S4, no murmur.  1+ edema to knees bilaterally.  No carotid bruit.   Abdomen: Soft, nontender, no hepatosplenomegaly, no distention.  Neurologic: Alert and oriented x 3.  Psych: Normal affect. Extremities: No clubbing or cyanosis.  Skin: Erythema right lower leg.   TELEMETRY:  Reviewed telemetry pt in NSR  ASSESSMENT AND PLAN: 80 yo with CAD, diastolic CHF, PAF, and recurrent cellulitis was admitted with cellulitis and found to have HCAP.  1. ID: Cellulitis right lower leg, HCAP RLL.  Treatment per primary service.  Strep viridans bacteremia from cellulitis or PNA.  TEE today with no endocarditis.  2. Atrial fibrillation: Patient converted back to NSR on her own. She remains in NSR today.  Continue Eliquis 2.5 mg bid and bisoprolol. 3. Acute on chronic systolic CHF: Patient is volume overloaded on exam today.  EF 55-60% on today's TEE.   - Change Lasix to 40 mg IV bid with BMET in am.  4. CAD: s/p CABG.  No chest pain, continue statin.  On Eliquis so not on ASA.  5.  CKD: Stable, follow BMET with diuresis.  6. Chronic back pain: Very limiting.  Ongoing pain medication requirement.   [name] 06/22/2014 4:58 PM

## 2014-06-23 ENCOUNTER — Encounter (HOSPITAL_COMMUNITY): Payer: Self-pay | Admitting: Cardiology

## 2014-06-23 DIAGNOSIS — L03115 Cellulitis of right lower limb: Secondary | ICD-10-CM

## 2014-06-23 LAB — GLUCOSE, CAPILLARY
GLUCOSE-CAPILLARY: 162 mg/dL — AB (ref 70–99)
GLUCOSE-CAPILLARY: 194 mg/dL — AB (ref 70–99)
Glucose-Capillary: 278 mg/dL — ABNORMAL HIGH (ref 70–99)
Glucose-Capillary: 226 mg/dL — ABNORMAL HIGH (ref 70–99)

## 2014-06-23 LAB — BASIC METABOLIC PANEL
ANION GAP: 12 (ref 5–15)
BUN: 37 mg/dL — ABNORMAL HIGH (ref 6–23)
CO2: 29 mEq/L (ref 19–32)
Calcium: 9.5 mg/dL (ref 8.4–10.5)
Chloride: 99 mEq/L (ref 96–112)
Creatinine, Ser: 1.38 mg/dL — ABNORMAL HIGH (ref 0.50–1.10)
GFR, EST AFRICAN AMERICAN: 41 mL/min — AB (ref >90)
GFR, EST NON AFRICAN AMERICAN: 36 mL/min — AB (ref >90)
GLUCOSE: 197 mg/dL — AB (ref 70–99)
POTASSIUM: 5 meq/L (ref 3.7–5.3)
SODIUM: 140 meq/L (ref 137–147)

## 2014-06-23 MED ORDER — FUROSEMIDE 40 MG PO TABS
40.0000 mg | ORAL_TABLET | Freq: Every day | ORAL | Status: DC
  Administered 2014-06-24: 40 mg via ORAL
  Filled 2014-06-23: qty 1

## 2014-06-23 MED ORDER — CEFTRIAXONE SODIUM IN DEXTROSE 20 MG/ML IV SOLN
1.0000 g | Freq: Two times a day (BID) | INTRAVENOUS | Status: DC
  Administered 2014-06-23 – 2014-06-24 (×3): 1 g via INTRAVENOUS
  Filled 2014-06-23 (×5): qty 50

## 2014-06-23 NOTE — Progress Notes (Signed)
Patient Name: Cynthia Hicks Date of Encounter: 06/23/2014     Active Problems:   Diabetes mellitus   HTN (hypertension)   Autoimmune hepatitis   Coronary artery disease   Chronic back pain   Chronic kidney disease   Atrial fibrillation   Lymphedema   Cellulitis of right lower extremity   Diabetes mellitus type 2, uncontrolled   CAP (community acquired pneumonia)   Acute on chronic diastolic CHF (congestive heart failure)    SUBJECTIVE  Denies any CP or SOB. Continue to have RLE redness, unchanged. Her back has been bothering her.   CURRENT MEDS . apixaban  2.5 mg Oral BID  . bisoprolol  5 mg Oral Daily  . calcium carbonate  1 tablet Oral BID WC  . cholecalciferol  2,000 Units Oral QHS  . ezetimibe  10 mg Oral Daily  . furosemide  40 mg Intravenous BID  . gabapentin  100 mg Oral TID  . insulin aspart  0-9 Units Subcutaneous TID WC  . levofloxacin  750 mg Oral Q48H  . methadone  10 mg Oral BID  . omega-3 acid ethyl esters  1 g Oral BID  . pantoprazole  40 mg Oral Daily  . polyethylene glycol  17 g Oral Daily  . pravastatin  80 mg Oral QPM  . predniSONE  10 mg Oral Daily  . sirolimus  1 mg Oral QODAY  . vancomycin  500 mg Intravenous Q24H    OBJECTIVE  Filed Vitals:   06/22/14 1420 06/22/14 2018 06/23/14 0343 06/23/14 0807  BP: 135/79 127/77 124/65 113/46  Pulse: 68 70 69 60  Temp: 97.6 F (36.4 C) 97.6 F (36.4 C) 98.4 F (36.9 C)   TempSrc:  Oral Oral   Resp: 18 18 18    Height:      Weight:   128 lb 1.4 oz (58.1 kg)   SpO2: 97% 96% 99%     Intake/Output Summary (Last 24 hours) at 06/23/14 0828 Last data filed at 06/23/14 0344  Gross per 24 hour  Intake    120 ml  Output   3850 ml  Net  -3730 ml   Filed Weights   06/18/14 1809 06/23/14 0343  Weight: 131 lb (59.421 kg) 128 lb 1.4 oz (58.1 kg)    PHYSICAL EXAM  General: Pleasant, NAD. Neuro: Alert and oriented X 3. Moves all extremities spontaneously. Psych: Normal affect. HEENT:   Normal  Neck: Supple without bruits or JVD. Lungs:  Resp regular and unlabored, CTA. Heart: RRR no s3, s4, or murmurs. Abdomen: Soft, non-tender, non-distended, BS + x 4.  Extremities: No clubbing, cyanosis. DP/PT/Radials 2+ and equal bilaterally.   Accessory Clinical Findings  Basic Metabolic Panel  Recent Labs  06/22/14 0443 06/23/14 0353  NA 143 140  K 4.2 5.0  CL 103 99  CO2 28 29  GLUCOSE 139* 197*  BUN 33* 37*  CREATININE 1.25* 1.38*  CALCIUM 9.5 9.5    TELE NSR with HR 60s, no significant ventricular ectopy    ECG  No new EKG  Echocardiogram TEE 11/17  Findings: Please see echo section for full report. Normal LV size and systolic function, EF 62-56%. Normal RV size and systolic function. Mild mitral regurgitation. Trileaflet aortic valve with trivial regurgitation and no stenosis, moderately calcified. The left atrium was mildly dilated. The left atrial appendage was large but no thrombus was seen. Negative bubble study so no ASD or PFO. There was prominent (grade IV) plaque in the descending thoracic  aorta. No vegetation was seen on any valve.   There was no evidence for endocarditis.     Radiology/Studies  Dg Chest 2 View  06/21/2014   CLINICAL DATA:  Subsequent encounter for pneumonia  EXAM: CHEST  2 VIEW  COMPARISON:  06/18/2014  FINDINGS: Mild cardiac enlargement stable. Vascular pattern normal. Left lung clear.  Increased opacity over the right lung base. This appears to be related to a small effusion with underlying airspace disease.  IMPRESSION: Increased opacity over the right lung base constituting a combination of small effusion and underlying airspace disease.   Electronically Signed   By: Skipper Cliche M.D.   On: 06/21/2014 16:26   Dg Lumbar Spine Complete  06/18/2014   CLINICAL DATA:  80 year old with chronic lower back pain, acutely worse today  EXAM: LUMBAR SPINE - COMPLETE 4+ VIEW  COMPARISON:  CT from 05/24/2014  FINDINGS: There are  5 lumbar type vertebral bodies.  Multilevel degenerative changes of the spine are present, evidenced by disc space narrowing, osteophyte formation and vacuum disc phenomena. Findings are most prominent at the L2-L3, L3-L4, L4-L5 and L5-S1 levels. The partially visualized lower thoracic spine is unremarkable. An unchanged endplate compression deformity of L3 is again noted.  There is no acute fracture or listhesis.  The bone mineralization is diminished.  The partially visualized abdomen demonstrates scattered vascular calcifications and a moderate amount of retained stool.  IMPRESSION: 1. Extensive lumbar spine degenerative disc disease. 2. No acute fracture or listhesis.   Electronically Signed   By: Rosemarie Ax   On: 06/18/2014 16:27   Ct Head Wo Contrast  06/18/2014   CLINICAL DATA:  80 year old recent fall and associated head injury  EXAM: CT HEAD WITHOUT CONTRAST  TECHNIQUE: Contiguous axial images were obtained from the base of the skull through the vertex without intravenous contrast.  COMPARISON:  None.  FINDINGS: There is diffuse, confluent, periventricular white matter hypodensity, compatible with chronic small vessel white matter disease.  There is no evidence of an acute infarct.  There is no acute intracranial hemorrhage.  There is no midline shift or mass effect.  There is no extra-axial fluid collection.  The paranasal sinuses and mastoid air cells are aerated.  The orbits are intact.  IMPRESSION: IMPRESSION  No acute intracranial hemorrhage.   Electronically Signed   By: Rosemarie Ax   On: 06/18/2014 16:14   Dg Chest Port 1 View  06/18/2014   CLINICAL DATA:  Fever, shortness of breath, chest pain x2 days  EXAM: PORTABLE CHEST - 1 VIEW  COMPARISON:  02/15/2014  FINDINGS: Eventration of the right hemidiaphragm. Mild patchy right basilar opacity, likely atelectasis. No pleural effusion or pneumothorax.  The heart is normal in size.  Old right proximal humeral fracture.  IMPRESSION: Mild  patchy right basilar opacity, likely atelectasis.   Electronically Signed   By: Julian Hy M.D.   On: 06/18/2014 09:36    ASSESSMENT AND PLAN  80 yo with CAD, diastolic CHF, PAF, and recurrent cellulitis was admitted with cellulitis and found to have HCAP.   1. ID: Cellulitis right lower leg, HCAP RLL.   - Strep viridans bacteremia from cellulitis or PNA. TEE with no endocarditis, EF normal .   - per patient, she was suppose to see a vascular surgeon yesterday to r/o LE arterial blockage, however she states she has never had any claudication symptom. Will discuss with Dr. Mare Ferrari, potentially get lower extremity arterial duplex while the patient is admitted, low suspicion for  LE arterial blockage. Cardiology potentially sign off.    2. Atrial fibrillation: Patient converted back to NSR on her own. She remains in NSR today. Continue Eliquis 2.5 mg bid and bisoprolol.  3. Acute on chronic systolic CHF: Patient is volume overloaded on exam today. EF 55-60% on today's TEE.   - -7L since arrival. Lung clear on exam. Will switch to PO lasix tomorrow.   4. CAD: s/p CABG. No chest pain, continue statin. On Eliquis so not on ASA.   5. CKD: Stable, follow BMET with diuresis.   6. Chronic back pain: Very limiting. Ongoing pain medication requirement.   Hilbert Corrigan PA-C Pager: [phone] Agree with above assessment. The patient was to have seen Dr. Scot Dock yesterday in office regarding her chronic right leg swelling.  She has been using a lymphedema pump at home successfully. Would defer to primary service whether they would want to ask [facility] to see her here in the hospital vs. arranging for another outpatient appointment with Dr. Scot Dock. Otherwise stable from cardiac standpoint. Remains in NSR.

## 2014-06-23 NOTE — Progress Notes (Signed)
Subjective: Pt feeling better compared to yesterday. Feels like RLE cellulitis is almost back to baseline. Pt states she has a left sided lower tooth infection underneath a tooth crown that needs to be removed, has been present the past 4-5 years. Denies tooth pain.   Objective: Vital signs in last 24 hours: Filed Vitals:   06/22/14 1420 06/22/14 2018 06/23/14 0343 06/23/14 0807  BP: 135/79 127/77 124/65 113/46  Pulse: 68 70 69 60  Temp: 97.6 F (36.4 C) 97.6 F (36.4 C) 98.4 F (36.9 C)   TempSrc:  Oral Oral   Resp: 18 18 18    Height:      Weight:   128 lb 1.4 oz (58.1 kg)   SpO2: 97% 96% 99%    Weight change:   Intake/Output Summary (Last 24 hours) at 06/23/14 0913 Last data filed at 06/23/14 0344  Gross per 24 hour  Intake    120 ml  Output   3600 ml  Net  -3480 ml   PE General: NAD, pleasant HEENT: gums non erythematous  Lungs: CTAB, no wheezes Cardiac: RRR, S1/S2, no murmurs GI: active bowel sounds, non tender to palpation, soft Ext: RLE erythematous and warm, improved from previous day. 1 inch in length open wound on LLE anterior shin w/o any erythema or pus; wound w/ stiches on knee healing well Neuro: CN 2-12 grossly intact  Lab Results: Basic Metabolic Panel:  Recent Labs Lab 06/22/14 0443 06/23/14 0353  NA 143 140  K 4.2 5.0  CL 103 99  CO2 28 29  GLUCOSE 139* 197*  BUN 33* 37*  CREATININE 1.25* 1.38*  CALCIUM 9.5 9.5   Liver Function Tests:  Recent Labs Lab 06/18/14 1026  AST 23  ALT 16  ALKPHOS 80  BILITOT 0.7  PROT 6.7  ALBUMIN 2.8*   CBC:  Recent Labs Lab 06/18/14 0926 06/18/14 1026 06/19/14 0515 06/20/14 0310  WBC 14.2* 11.1* 11.8* 9.7  NEUTROABS 10.5* 8.1*  --   --   HGB 11.3* 12.6 9.9* 9.6*  HCT 35.7* 39.4 31.8* 30.2*  MCV 94.4 93.8 97.0 95.9  PLT 150 143* 124* 125*   Cardiac Enzymes:  Recent Labs Lab 06/18/14 2057  TROPONINI <0.30   CBG:  Recent Labs Lab 06/21/14 2121 06/22/14 0611 06/22/14 1112  06/22/14 1623 06/22/14 2032 06/23/14 0623  GLUCAP 241* 135* 127* 272* 265* 162*   Thyroid Function Tests:  Recent Labs Lab 06/18/14 2000  TSH 3.550    Medications: I have reviewed the patient's current medications. Scheduled Meds: . apixaban  2.5 mg Oral BID  . bisoprolol  5 mg Oral Daily  . calcium carbonate  1 tablet Oral BID WC  . cholecalciferol  2,000 Units Oral QHS  . ezetimibe  10 mg Oral Daily  . [START ON 06/24/2014] furosemide  40 mg Oral Daily  . gabapentin  100 mg Oral TID  . insulin aspart  0-9 Units Subcutaneous TID WC  . levofloxacin  750 mg Oral Q48H  . methadone  10 mg Oral BID  . omega-3 acid ethyl esters  1 g Oral BID  . pantoprazole  40 mg Oral Daily  . polyethylene glycol  17 g Oral Daily  . pravastatin  80 mg Oral QPM  . predniSONE  10 mg Oral Daily  . sirolimus  1 mg Oral QODAY  . vancomycin  500 mg Intravenous Q24H   Continuous Infusions: . sodium chloride 500 mL (06/22/14 1215)   PRN Meds:.cyclobenzaprine, docusate sodium, HYDROcodone-acetaminophen, nitroGLYCERIN  Assessment/Plan: Active Problems:   Diabetes mellitus   HTN (hypertension)   Autoimmune hepatitis   Coronary artery disease   Chronic back pain   Chronic kidney disease   Atrial fibrillation   Lymphedema   Cellulitis of right lower extremity   Diabetes mellitus type 2, uncontrolled   CAP (community acquired pneumonia)   Acute on chronic diastolic CHF (congestive heart failure)   Bacteremia due to strep viridans- likely source from cellulitis. And increased risk of infection, with immunocompromised state.  - blood culture 11/13 reveal strep viridians sensitive to ceftriaxone and levo, pt states she only has an allergy to cyclosporine and not cephalosporins. Will transition to ceftriaxone IV 1 mg q12h - Repeat blood cultures 11/15 NGTD -cardiology following, TEE negative for endocarditis  Acute on chronic systolic CHF-- cardiology following, feel that pt was volume  overloaded, Lungs CTAB - IV lasix 40mg  x 2 doses given since yesterday w/ -3.73L output since then. Will start po lasix 40 mg tomorrow.   HCAP: repeat CXR reveals increased opacity over rt lung base 2/2 small effusion and airspace disease -transition levofloxacin 750mg  to ceftriaxone 1mg  IV q12h  Fall, chronic back pain: lumbar xray on 11/13 negative for acute fractures, noted extensive lumbar spine degenerative disc disease - PT following -Continue home calcium carbonate 600mg  BID and vitamin D 2000u daily -Continue home gabapentin 100mg  TID -Norco 10/325mg  1 tablet Q6hr prn pain, may need extra doses for breakthrough -continue home methadone 10mg  BID - pain located mainly at lower rt flank area likely MSK, flexeril 5mg  TID prn for muscle spasm  Atrial fibrillation on Eliquis: TSH wnl, troponin negative -pt in NSR -Continue home Eliquis 2.5mg  BID -continue bisoprolol 5mg  daily  DM2 -Accuchecks q AC, HS -SSI  HTN: normotensive -home losartan 25mg  daily held  Autoimmune hepatitis:  -continue home prednisone 10mg  daily -continue home sirolimus 1mg  every other day  FEN: Heart healthy/carb modified  DVT ppx: continue home Eliquis  Dispo: Disposition is deferred at this time, awaiting improvement of current medical problems.   The patient does have a current PCP (Imran Gertie Baron, MD) and does not need an [facility] hospital follow-up appointment after discharge. .Services Needed at time of discharge: Y = Yes, Blank = No PT:   SNF and 3in1.   OT:   RN:   Equipment:   Other:     LOS: 5 days   Julious Oka, MD 06/23/2014, 9:13 AM

## 2014-06-23 NOTE — Progress Notes (Signed)
VASCULAR LAB PRELIMINARY  PRELIMINARY  PRELIMINARY  PRELIMINARY  Bilateral lower extremity arterial duplex completed.    Preliminary report:  Right:  Tandem lesions in the mid femoral artery.  Monophasic waveforms.  Left:  Stenosis in the mid femoral artery.  Biphasic waveforms.  Cynthia Hicks, RVT 06/23/2014, 6:39 PM

## 2014-06-23 NOTE — Progress Notes (Signed)
Pt seen and examined with Dr. Hulen Luster. Please refer to resident note for details.  Pt states her right leg is almost at baseline. She still complains of back pain but feels well otherwise She does state that she needs dental work and that one of her teeth needs to be removed  Physical exam: CVS: RRR, normal heart sounds Pulm: CTA b/l Abd: soft, non tender, BS + Ext: decreased erythema R LE, no tenderness to palpation  Strep Viridans bacteremia: - TEE with no evidence of endocarditis per cardio - UIncertain etiology of bacteremia- possibly secondary to dental infection - Pt denies allergies to penicillin or cephalosporin. Will d/c vanco and start rocephin - Repeat blood cx negative till date - Will need 2 weeks of antibiotics- will consider d/c home on ceftin  HCAP: - Will d/c levaquin and start rocephin - No further w/u for now  Back pain: - likely MSK in origin given location in R flank - c/w pain control

## 2014-06-24 ENCOUNTER — Encounter (HOSPITAL_COMMUNITY): Payer: Medicare Other

## 2014-06-24 ENCOUNTER — Ambulatory Visit: Payer: Medicare Other | Admitting: Vascular Surgery

## 2014-06-24 DIAGNOSIS — N183 Chronic kidney disease, stage 3 unspecified: Secondary | ICD-10-CM

## 2014-06-24 LAB — GLUCOSE, CAPILLARY
GLUCOSE-CAPILLARY: 134 mg/dL — AB (ref 70–99)
GLUCOSE-CAPILLARY: 186 mg/dL — AB (ref 70–99)

## 2014-06-24 MED ORDER — CYCLOBENZAPRINE HCL 5 MG PO TABS: 5.0000 mg | ORAL_TABLET | Freq: Three times a day (TID) | ORAL | Status: DC | PRN

## 2014-06-24 MED ORDER — CEPHALEXIN 500 MG PO CAPS: 500.0000 mg | ORAL_CAPSULE | Freq: Two times a day (BID) | ORAL | Status: DC

## 2014-06-24 MED ORDER — METHADONE HCL 10 MG PO TABS: 10.0000 mg | ORAL_TABLET | Freq: Two times a day (BID) | ORAL | Status: DC

## 2014-06-24 MED ORDER — HYDROCODONE-ACETAMINOPHEN 10-325 MG PO TABS: 1.0000 | ORAL_TABLET | Freq: Four times a day (QID) | ORAL | Status: DC | PRN

## 2014-06-24 MED ORDER — OXYCODONE-ACETAMINOPHEN 5-325 MG PO TABS: 1.0000 | ORAL_TABLET | Freq: Three times a day (TID) | ORAL | Status: DC | PRN

## 2014-06-24 NOTE — Plan of Care (Signed)
Problem: Phase II Progression Outcomes Goal: CV Risk Factors identified Outcome: Completed/Met Date Met:  06/24/14 Goal: Progress activity as tolerated unless otherwise ordered Outcome: Progressing Goal: Discharge plan established Outcome: Completed/Met Date Met:  06/24/14 Goal: Other Phase II Outcomes/Goals Outcome: Completed/Met Date Met:  06/24/14  Problem: Phase III Progression Outcomes Goal: Sinus rhythm established or heart rate < 100 at rest Outcome: Completed/Met Date Met:  06/24/14 Goal: Anticoagulation Therapy per MD order Outcome: Completed/Met Date Met:  06/24/14 Goal: Pain controlled on oral analgesia Outcome: Progressing Goal: Activity at appropriate level-compared to baseline (UP IN CHAIR FOR HEMODIALYSIS)  Outcome: Completed/Met Date Met:  06/24/14 Goal: Tolerating diet Outcome: Completed/Met Date Met:  06/24/14 Goal: Discharge plan remains appropriate-arrangements made Outcome: Adequate for Discharge Goal: Other Phase III Outcomes/Goals Outcome: Completed/Met Date Met:  06/24/14

## 2014-06-24 NOTE — Discharge Summary (Signed)
Name: Cynthia Hicks MRN: [id number] DOB: 1932/12/05 80 y.o. PCP: Raelyn Number, MD  Date of Admission: 06/18/2014 10:21 AM Date of Discharge: 06/24/2014 Attending Physician: [name], MD  Discharge Diagnosis: Principal Problem:   Cellulitis of right lower extremity Active Problems:   Anemia of chronic disease   HTN (hypertension)   Autoimmune hepatitis   Coronary artery disease   Chronic back pain   Atrial fibrillation   Lymphedema   Diabetes mellitus type 2, uncontrolled   CAP (community acquired pneumonia)   Acute on chronic diastolic CHF (congestive heart failure)   CKD (chronic kidney disease), stage III  Discharge Medications:   Medication List    TAKE these medications        ACCU-CHEK AVIVA PLUS W/DEVICE Kit  Use to check blood sugar 3 times per day dx code 250.00     apixaban 5 MG Tabs tablet  Commonly known as:  ELIQUIS  Take 1 tablet (5 mg total) by mouth 2 (two) times daily.     bisoprolol 5 MG tablet  Commonly known as:  ZEBETA  TAKE 1 TABLET BY MOUTH EVERY DAY     calcium carbonate 600 MG Tabs tablet  Commonly known as:  OS-CAL  Take 600 mg by mouth 2 (two) times daily with a meal.     cyclobenzaprine 5 MG tablet  Commonly known as:  FLEXERIL  Take 1 tablet (5 mg total) by mouth 3 (three) times daily as needed for muscle spasms.     ezetimibe 10 MG tablet  Commonly known as:  ZETIA  Take 10 mg by mouth daily.     fish oil-omega-3 fatty acids 1000 MG capsule  Take 1 g by mouth 2 (two) times daily.     furosemide 40 MG tablet  Commonly known as:  LASIX  Take 1 tablet (40 mg total) by mouth daily.     gabapentin 100 MG capsule  Commonly known as:  NEURONTIN  Take 1 capsule (100 mg total) by mouth 3 (three) times daily.     glucose blood test strip  Commonly known as:  ACCU-CHEK AVIVA PLUS  Use as instructed to check blood sugar 3 times per day dx code 250.00     HYDROcodone-acetaminophen 10-325 MG per tablet  Commonly known  as:  NORCO  Take 1 tablet by mouth every 6 (six) hours as needed for severe pain.     losartan 25 MG tablet  Commonly known as:  COZAAR  TAKE 1 TABLET BY MOUTH EVERY DAY.     MAGNESIUM PO  Take 125 mg by mouth daily.     methadone 10 MG tablet  Commonly known as:  DOLOPHINE  Take 1 tablet (10 mg total) by mouth 2 (two) times daily.     multivitamin capsule  Take 1 capsule by mouth daily.     nitroGLYCERIN 0.4 MG SL tablet  Commonly known as:  NITROSTAT  Place 1 tablet (0.4 mg total) under the tongue every 5 (five) minutes as needed for chest pain.     NOVOLOG 100 UNIT/ML injection  Generic drug:  insulin aspart  15-17 at breakfast and lunch and 35-40 units at dinner     omeprazole 20 MG capsule  Commonly known as:  PRILOSEC  Take 20 mg by mouth daily.     ONETOUCH DELICA LANCETS FINE Misc  Use to check blood sugar 3 times per day dx code 250.00     oxyCODONE-acetaminophen 5-325 MG per tablet  Commonly  known as:  PERCOCET/ROXICET  Take 1 tablet by mouth every 8 (eight) hours as needed for moderate pain.     pravastatin 80 MG tablet  Commonly known as:  PRAVACHOL  Take 1 tablet (80 mg total) by mouth every evening.     predniSONE 10 MG tablet  Commonly known as:  DELTASONE  Take 10 mg by mouth daily.     PROBIOTIC DAILY PO  Take 1 tablet by mouth daily.     RAPAMUNE 1 MG tablet  Generic drug:  sirolimus  Take 1 mg by mouth every other day.     Selenium 200 MCG Caps  Take 1 capsule by mouth daily.     vitamin C 1000 MG tablet  Take 1,000 mg by mouth 2 (two) times daily.     VITAMIN D PO  Take 2,000 Units by mouth at bedtime.      ASK your doctor about these medications        Chromium 500 MCG Tabs  Take 1 tablet by mouth daily.        Disposition and follow-up:   Cynthia Hicks was discharged from [facility] in [place] condition.  At the hospital follow up visit please address:  1.  Please have ensure pt has f/u appt w/ Dr.  Scot Dock regarding mid femoral stenosis of LLE and tandem lesions found in mid femoral artery on RLE. Pt decline further workup inpatient as she does not want sx 2/2 immune compromised status and does not feel symptomatic.   2.  Labs / imaging needed at time of follow-up: pt will need blood cultures taken on 12/2 to document clearance of bacteria  3.  Pending labs/ test needing follow-up: blood cutlures x 2 taken on 11/15 revealed no growth to date but final read pending. Lower extremity arterial duplex on 06/23/14 final read pending.    Discharge Instructions:   Pt will need to take one dose of keflex 511m once tonight and then will start taking keflex twice a day with last doses on 11/28. She will need to have repeat blood cultures taken on 12/2 ( 4 days after completion of abx) to ensure clearance of bacteria.    Pt would like to establish new PCP in [place], she was given a list of PCPs in [place] social work. Please assist pt with PCP f/u appt on d/c from SNF when appropriate.   Consultations: Treatment Team:  Rounding Lbcardiology, MD  Procedures Performed:  Dg Chest 2 View  06/21/2014   CLINICAL DATA:  Subsequent encounter for pneumonia  EXAM: CHEST  2 VIEW  COMPARISON:  06/18/2014  FINDINGS: Mild cardiac enlargement stable. Vascular pattern normal. Left lung clear.  Increased opacity over the right lung base. This appears to be related to a small effusion with underlying airspace disease.  IMPRESSION: Increased opacity over the right lung base constituting a combination of small effusion and underlying airspace disease.   Electronically Signed   By: RSkipper ClicheM.D.   On: 06/21/2014 16:26   Dg Lumbar Spine Complete  06/18/2014   CLINICAL DATA:  80year old with chronic lower back pain, acutely worse today  EXAM: LUMBAR SPINE - COMPLETE 4+ VIEW  COMPARISON:  CT from 05/24/2014  FINDINGS: There are 5 lumbar type vertebral bodies.  Multilevel degenerative changes of the spine  are present, evidenced by disc space narrowing, osteophyte formation and vacuum disc phenomena. Findings are most prominent at the L2-L3, L3-L4, L4-L5 and L5-S1 levels. The partially  visualized lower thoracic spine is unremarkable. An unchanged endplate compression deformity of L3 is again noted.  There is no acute fracture or listhesis.  The bone mineralization is diminished.  The partially visualized abdomen demonstrates scattered vascular calcifications and a moderate amount of retained stool.  IMPRESSION: 1. Extensive lumbar spine degenerative disc disease. 2. No acute fracture or listhesis.   Electronically Signed   By: Rosemarie Ax   On: 06/18/2014 16:27   Ct Head Wo Contrast  06/18/2014   CLINICAL DATA:  80 year old recent fall and associated head injury  EXAM: CT HEAD WITHOUT CONTRAST  TECHNIQUE: Contiguous axial images were obtained from the base of the skull through the vertex without intravenous contrast.  COMPARISON:  None.  FINDINGS: There is diffuse, confluent, periventricular white matter hypodensity, compatible with chronic small vessel white matter disease.  There is no evidence of an acute infarct.  There is no acute intracranial hemorrhage.  There is no midline shift or mass effect.  There is no extra-axial fluid collection.  The paranasal sinuses and mastoid air cells are aerated.  The orbits are intact.  IMPRESSION: IMPRESSION  No acute intracranial hemorrhage.   Electronically Signed   By: Rosemarie Ax   On: 06/18/2014 16:14   Dg Chest Port 1 View  06/18/2014   CLINICAL DATA:  Fever, shortness of breath, chest pain x2 days  EXAM: PORTABLE CHEST - 1 VIEW  COMPARISON:  02/15/2014  FINDINGS: Eventration of the right hemidiaphragm. Mild patchy right basilar opacity, likely atelectasis. No pleural effusion or pneumothorax.  The heart is normal in size.  Old right proximal humeral fracture.  IMPRESSION: Mild patchy right basilar opacity, likely atelectasis.   Electronically Signed   By:  Julian Hy M.D.   On: 06/18/2014 09:36    TEE 06/22/14-- Normal LV size and systolic function, EF 42-35%. Normal RV size and systolic function. Mild mitral regurgitation. Trileaflet aortic valve with trivial regurgitation and no stenosis, moderately calcified. The left atrium was mildly dilated. The left atrial appendage was large but no thrombus was seen. Negative bubble study so no ASD or PFO. There was prominent (grade IV) plaque in the descending thoracic aorta. No vegetation was seen on any valve. There was no evidence for endocarditis.   Lower ext arterial duplex 06/23/14-- Preliminary report: Right: Tandem lesions in the mid femoral artery. Monophasic waveforms. Left: Stenosis in the mid femoral artery. Biphasic waveforms.   Admission HPI: Cynthia Hicks is a 80 year old woman with history of paroxysmal atrial fibrillation on Eliquis, HTN, DM2, CAD s/p DES x 2 to RCA in 2007 c/b wire perforation posterior left ventricular br that was coil embolized and DES to CFX in 10/6142, diastolic CHF, CKD, autoimmune hepatitis on prednisone and sirolimus, chronic venous insufficiency presenting with fever. She presented to the hospital today for scheduled cardioversion of her atrial fibrillation. She was noted to have temperature to 100.1. She notes that she has RLE redness that is similar to past cellulitis episodes she has had. She has had 4 episodes of cellulitis this past year. Her last episode was in October and she was hospitalized at [facility] for five days and treated with 10 day course of Keflex.  She had a fall on Tuesday. She is uncertain if it was mechanical in nature but notes her blood sugar was 53 after. She reports hitting her head. Denies lightheadedness or LOC. She has ecchymosis on her forehead and L knee. She has a laceration on the bridge of her nose and  on her left knee.  She was seen in 07/2012 by cardiology and found to be in atrial fibrillation. Coumadin was  started. She was cardioverted to normal sinus rhythm 10/2012. She found to be in atrial fibrillation 04/09/2014 and remains in follow up by Dr. Aundra Dubin 06/14/2014.  She denies fevers or chills prior to arrival. She also denies vision changes, change in baseline dry cough, shortness of breath, chest pain, palpitations, nausea, vomiting, diarrhea, dysuria, hematuria, rash, myalgias, weakness, paresthesias, LH.   Hospital Course by problem list: Principal Problem:   Cellulitis of right lower extremity Active Problems:   Anemia of chronic disease   HTN (hypertension)   Autoimmune hepatitis   Coronary artery disease   Chronic back pain   Atrial fibrillation   Lymphedema   Diabetes mellitus type 2, uncontrolled   CAP (community acquired pneumonia)   Acute on chronic diastolic CHF (congestive heart failure)   CKD (chronic kidney disease), stage III   RLE cellulitis w/ strept viridians bactermia: History of recurrent RLE cellulitis. She has underlying chronic venous insufficiency and lymphedema. She is on chronic prednisone and sirolimus. Fever to 102.4 on admission with leukocytosis and tachycardia. RLE  erythematous with some warmth, no purulent drainage. CXR discussed below. UA with small leukocytes, neg nitrites, few squamous epithelium and few bacteria; pt symptomatic. She received 527m IV vancomycin in the ED. Continue vancomycin IV and then transitioned to ceftriaxone IV after blood cultures revealed sensitivities to ceftriaxone and levofloxacin. Repeat blood cultures on 11/15 NGTD, final read pending. On day of d/c transitioned to keflex 5030mBID for a total of 2 weeks with start date of 11/15. Pt should be finished w/ her course of abx on 11/28. Will need repeat blood cultures on 12/2. Also, of note pt was scheduled for lower arterial duplex studies with Dr. DiScot Dockrom vascular sx. Study was ordered and prelim results revealed Preliminary report: Right: Tandem lesions in the mid femoral artery.  Monophasic waveforms. Left: Stenosis in the mid femoral artery. Biphasic waveforms. Final read pending.    HCAP: CXR with mild patchy R basilar opacity with some hypoxia. Repeat CXR on 11/16 revealed increased opacity over rt lung base due to combination of small effusion and underlying airspace disease. She denies shortness of breath or productive cough. She received IV levofloxacin 75063mn the ED. Continued levofloxacin and then transitioned to ceftriaxone IV as above. Will continue keflex po on discharge.   Fall: History of osteoporosis. She had a fall on 11/10. She is uncertain if it was mechanical in nature but notes her blood sugar was 53 after. She reports hitting her head. Denies lightheadedness or LOC. She has ecchymosis on her forehead and L knee. She has a laceration on the bridge of her nose and on her left knee. She also noted acute exacerbation of her lower back pain where she was found to have a L3 compression fracture 2 months ago, history of back surgeries. No focal neuro findings on exam. On Eliquis for a fib. CT head without acute intracranial hemorrhage. XR lumbar spine with extensive DDD but no acute fx. Physical therapy consulted and recommended SNF. Continued home calcium carbonate 600m4mD and vitamin D 2000u daily. Continue home methadone 10mg64m, norco, and oxycodine. On further clarification back pain is located at rt lower flank region and pt believes she pulled a muscle when she fell on 11/10. Started flexeril 5mg T62mPRN for back pain.   Atrial fibrillation on Eliquis: TSH WNL. Scheduled for cardioversion day  of admission. Cardiology saw patient and she was in normal sinus rhythm. Continued home Eliquis 2.22m BID and  bisoprolol 555mdaily. On day of discharge pt was in NSR.   CAD, diastolic CHF: Continued home meds Zetia 1022maily, fish oil 1g BID,  nitrostat 0.4mg34mmin42mn chest pain, and  pravastatin 80mg 66my. Cardiology following, gave pt IV lasix 40mg x23moses.  Restarted home dose lasix 40mg PO47mday of discharge. Weight on d/c was 126 lbs down from 131lbs on admission. Net output was -8.7L since admission. TEE this admission revealed EF of 55-60%.   HTN: normotensive during admission. Losartan 25mg hel28mring hospital course.   Autoimmune hepatitis: Continued home prednisone 10mg dail12md sirolimus 1mg every 63mer day  Discharge Vitals:   BP 136/84 mmHg  Pulse 67  Temp(Src) 97.5 F (36.4 C) (Oral)  Resp 18  Ht 5' 4"  (1.626 m)  Wt 126 lb 11.2 oz (57.471 kg)  BMI 21.74 kg/m2  SpO2 100%  Discharge Labs:  Results for orders placed or performed during the hospital encounter of 06/18/14 (from the past 24 hour(s))  Glucose, capillary     Status: Abnormal   Collection Time: 06/23/14 11:11 AM  Result Value Ref Range   Glucose-Capillary 194 (H) 70 - 99 mg/dL   Comment 1 Notify RN   Glucose, capillary     Status: Abnormal   Collection Time: 06/23/14  5:03 PM  Result Value Ref Range   Glucose-Capillary 278 (H) 70 - 99 mg/dL   Comment 1 Documented in Chart    Comment 2 Notify RN   Glucose, capillary     Status: Abnormal   Collection Time: 06/23/14  9:11 PM  Result Value Ref Range   Glucose-Capillary 226 (H) 70 - 99 mg/dL   Comment 1 Documented in Chart    Comment 2 Notify RN   Glucose, capillary     Status: Abnormal   Collection Time: 06/24/14  6:07 AM  Result Value Ref Range   Glucose-Capillary 134 (H) 70 - 99 mg/dL    Signed: Nickcole Bralley TruonJulious Oka2015, 11:04 AM    Services Ordered on Discharge: SNF Equipment Ordered on Discharge: none

## 2014-06-24 NOTE — Progress Notes (Signed)
Medicare Important Message given? YES  (If response is "NO", the following Medicare IM given date fields will be blank)  Date Medicare IM given: 06/24/14 Medicare IM given by:  Dahlia Client [employer]

## 2014-06-24 NOTE — Progress Notes (Signed)
Pt seen and examined with Dr. Hulen Luster. Please refer to resident note for details.  Pt feels better. States RLE is at baseline.Pt still with persistent back pain. No fevers  Physical exam: CVS: RRR, normal heart sounds Pulm: CTA b/l Abd: soft, non tender, BS + Ext: decreased erythema R LE, no tenderness to palpation  Strep Viridans bacteremia: - TEE with no evidence of endocarditis per cardio - UIncertain etiology of bacteremia- possibly secondary to dental infection - Change to PO keflex today to comlpete 2 week course - Pt will need repeat blood cx after 2 week course of abx - Repeat blood cx negative till date - Outpatient f/u with dental  RLE cellulitis: - resolved now. To complete 2 week course of keflex  HCAP: - No further w/u for now - completed 5 day course of abx  Back pain: - likely MSK in origin given location in R flank - c/w pain control  PVD: - Pt with tandem lesions in right mid femoral artery and stenosis in left mid femoral artery - Outpatient f/u with vascular   Acute on chronic systolic CHF exacerbation: - d/c IV lasix. Pt diuresed well on lasix but now creatinine is slightly worsening - C/w PO lasix on discharge - Cardio f/u appreciated   Pt stable for d/c to SNF today

## 2014-06-24 NOTE — Clinical Social Work Note (Signed)
Patient to be d/c'ed today to [facility] in [place].  Patient and family agreeable to plans will transport via ems RN to call report.  Evette Cristal, MSW, [place]

## 2014-06-24 NOTE — Progress Notes (Signed)
Patient Name: Cynthia Hicks Date of Encounter: 06/24/2014    Principal Problem:   Cellulitis of right lower extremity Active Problems:   Atrial fibrillation   CAP (community acquired pneumonia)   Acute on chronic diastolic CHF (congestive heart failure)   Autoimmune hepatitis   Coronary artery disease   Diabetes mellitus type 2, uncontrolled   CKD (chronic kidney disease), stage III   Anemia of chronic disease   HTN (hypertension)   Chronic back pain   Lymphedema    SUBJECTIVE  No chest pain, sob, palpitations.  R leg feeling better - less swollen overall.  CURRENT MEDS . apixaban  2.5 mg Oral BID  . bisoprolol  5 mg Oral Daily  . calcium carbonate  1 tablet Oral BID WC  . cefTRIAXone (ROCEPHIN)  IV  1 g Intravenous Q12H  . cholecalciferol  2,000 Units Oral QHS  . ezetimibe  10 mg Oral Daily  . furosemide  40 mg Oral Daily  . gabapentin  100 mg Oral TID  . insulin aspart  0-9 Units Subcutaneous TID WC  . methadone  10 mg Oral BID  . omega-3 acid ethyl esters  1 g Oral BID  . pantoprazole  40 mg Oral Daily  . polyethylene glycol  17 g Oral Daily  . pravastatin  80 mg Oral QPM  . predniSONE  10 mg Oral Daily  . sirolimus  1 mg Oral QODAY    OBJECTIVE  Filed Vitals:   06/23/14 1324 06/23/14 2115 06/24/14 0437 06/24/14 0448  BP: 107/48 131/47 168/77 140/60  Pulse: 65 66 77   Temp: 97.8 F (36.6 C) 97.8 F (36.6 C) 98.3 F (36.8 C)   TempSrc: Oral Oral Oral   Resp: 17 18 18    Height:      Weight:   126 lb 11.2 oz (57.471 kg)   SpO2: 94% 96% 97%     Intake/Output Summary (Last 24 hours) at 06/24/14 0908 Last data filed at 06/24/14 0439  Gross per 24 hour  Intake    480 ml  Output   1625 ml  Net  -1145 ml   Filed Weights   06/18/14 1809 06/23/14 0343 06/24/14 0437  Weight: 131 lb (59.421 kg) 128 lb 1.4 oz (58.1 kg) 126 lb 11.2 oz (57.471 kg)    PHYSICAL EXAM  General: Pleasant, NAD. Neuro: Alert and oriented X 3. Moves all extremities  spontaneously. Psych: Normal affect. HEENT:  Normal  Neck: Supple without bruits or JVD. Lungs:  Resp regular and unlabored, CTA. Heart: RRR no s3, s4, or murmurs. Abdomen: Soft, non-tender, non-distended, BS + x 4.  Extremities: No clubbing, cyanosis.  RLE erythematous with trace edema. DP/PT/Radials 1+ and equal bilaterally.  Accessory Clinical Findings  Basic Metabolic Panel  Recent Labs  06/22/14 0443 06/23/14 0353  NA 143 140  K 4.2 5.0  CL 103 99  CO2 28 29  GLUCOSE 139* 197*  BUN 33* 37*  CREATININE 1.25* 1.38*  CALCIUM 9.5 9.5    TELE  Rsr, pac's.  Radiology/Studies  Dg Chest 2 View  06/21/2014   CLINICAL DATA:  Subsequent encounter for pneumonia  EXAM: CHEST  2 VIEW  COMPARISON:  06/18/2014  FINDINGS: Mild cardiac enlargement stable. Vascular pattern normal. Left lung clear.  Increased opacity over the right lung base. This appears to be related to a small effusion with underlying airspace disease.  IMPRESSION: Increased opacity over the right lung base constituting a combination of small effusion and underlying airspace  disease.   Electronically Signed   By: Skipper Cliche M.D.   On: 06/21/2014 16:26   ASSESSMENT AND PLAN  80 yo with CAD, diastolic CHF, PAF, and recurrent cellulitis was admitted with cellulitis and found to have HCAP.   1. ID: Cellulitis right lower leg, HCAP RLL.  - Strep viridans bacteremia from cellulitis and PNA. TEE with no endocarditis, EF normal .  - Prelim read on LE duplex: Right: Tandem lesions in the mid femoral artery. Monophasic waveforms. Left: Stenosis in the mid femoral artery. Biphasic waveforms.  Prev scheduled to see vascular as outpt.  3.  PAD: See #2.  3. Atrial fibrillation: Patient converted back to NSR on her own. She remains in NSR today. Continue Eliquis 2.5 mg bid and bisoprolol.  Has a h/o nosebleeds in setting of nasal congestion @ baseline.  Will have to watch on eliquis.  She  knows to report any significant bleeding.  4. Acute on chronic diastolic CHF: EF 32-12 by TEE this admission.  Wt down 5 lbs since the 13th - the lowest recorded @ [facility] up to this point.  Minus 8.9L for admission.  Euvolemic on exam. Creat bumping with slight rise in bicarb.  Switch to PO lasix today (last dose of IV yesterday morning).  HR/BP stable, cont bb.  4. CAD: s/p CABG. No chest pain, continue bb/statin. On Eliquis so not on ASA.   5. CKD III: As above, creat/bicarb up slightly.  Switched to PO today.  6. Chronic back pain: Very limiting. Ongoing pain medication requirement.   Signed, Murray Hodgkins MD Patient is doing well.  Right leg is less sore and less reddened. She remains in NSR. Tolerating apixaban. Switching to oral lasix today. Agree with above assessment and plan.

## 2014-06-24 NOTE — Progress Notes (Signed)
Subjective: Pt states RLE is back to baseline. Has no complaints. Back pain unchanged.    Pt has 2 runs of afib overnight, now in NSR  Objective: Vital signs in last 24 hours: Filed Vitals:   06/23/14 1324 06/23/14 2115 06/24/14 0437 06/24/14 0448  BP: 107/48 131/47 168/77 140/60  Pulse: 65 66 77   Temp: 97.8 F (36.6 C) 97.8 F (36.6 C) 98.3 F (36.8 C)   TempSrc: Oral Oral Oral   Resp: 17 18 18    Height:      Weight:   126 lb 11.2 oz (57.471 kg)   SpO2: 94% 96% 97%    Weight change: -1 lb 6.2 oz (-0.629 kg)  Intake/Output Summary (Last 24 hours) at 06/24/14 1048 Last data filed at 06/24/14 0900  Gross per 24 hour  Intake    720 ml  Output   1450 ml  Net   -730 ml   PE General: NAD, pleasant Lungs: CTAB, no wheezes Cardiac: RRR, S1/S2, no murmurs GI: active bowel sounds, non tender to palpation, soft Ext: RLE erythematous and warm, improved from previous days. wound w/ stiches on knee healing well Neuro: CN 2-12 grossly intact  Lab Results: Basic Metabolic Panel:  Recent Labs Lab 06/22/14 0443 06/23/14 0353  NA 143 140  K 4.2 5.0  CL 103 99  CO2 28 29  GLUCOSE 139* 197*  BUN 33* 37*  CREATININE 1.25* 1.38*  CALCIUM 9.5 9.5   Liver Function Tests:  Recent Labs Lab 06/18/14 1026  AST 23  ALT 16  ALKPHOS 80  BILITOT 0.7  PROT 6.7  ALBUMIN 2.8*   CBC:  Recent Labs Lab 06/18/14 0926 06/18/14 1026 06/19/14 0515 06/20/14 0310  WBC 14.2* 11.1* 11.8* 9.7  NEUTROABS 10.5* 8.1*  --   --   HGB 11.3* 12.6 9.9* 9.6*  HCT 35.7* 39.4 31.8* 30.2*  MCV 94.4 93.8 97.0 95.9  PLT 150 143* 124* 125*   Cardiac Enzymes:  Recent Labs Lab 06/18/14 2057  TROPONINI <0.30   CBG:  Recent Labs Lab 06/22/14 2032 06/23/14 0623 06/23/14 1111 06/23/14 1703 06/23/14 2111 06/24/14 0607  GLUCAP 265* 162* 194* 278* 226* 134*   Thyroid Function Tests:  Recent Labs Lab 06/18/14 2000  TSH 3.550    Medications: I have reviewed the patient's  current medications. Scheduled Meds: . apixaban  2.5 mg Oral BID  . bisoprolol  5 mg Oral Daily  . calcium carbonate  1 tablet Oral BID WC  . cefTRIAXone (ROCEPHIN)  IV  1 g Intravenous Q12H  . cholecalciferol  2,000 Units Oral QHS  . ezetimibe  10 mg Oral Daily  . furosemide  40 mg Oral Daily  . gabapentin  100 mg Oral TID  . insulin aspart  0-9 Units Subcutaneous TID WC  . methadone  10 mg Oral BID  . omega-3 acid ethyl esters  1 g Oral BID  . pantoprazole  40 mg Oral Daily  . polyethylene glycol  17 g Oral Daily  . pravastatin  80 mg Oral QPM  . predniSONE  10 mg Oral Daily  . sirolimus  1 mg Oral QODAY   Continuous Infusions: . sodium chloride 500 mL (06/22/14 1215)   PRN Meds:.cyclobenzaprine, docusate sodium, HYDROcodone-acetaminophen, nitroGLYCERIN Assessment/Plan: Principal Problem:   Cellulitis of right lower extremity Active Problems:   Anemia of chronic disease   HTN (hypertension)   Autoimmune hepatitis   Coronary artery disease   Chronic back pain   Atrial fibrillation  Lymphedema   Diabetes mellitus type 2, uncontrolled   CAP (community acquired pneumonia)   Acute on chronic diastolic CHF (congestive heart failure)   CKD (chronic kidney disease), stage III   Bacteremia due to strep viridans- likely source from tooth infection vs cellulitis. TEE neg for endocarditis.  -  ceftriaxone IV 1 mg q12h x 1 day, will transition to po keflex today on d/c for a total of 2 weeks of abx since documented clearance of bacteria on 06/20/14 blood cultures w/ end date on 07/03/14 - pt will need f/u blood cultures on 07/07/14 to ensure clearance of bacteria  RLE cellulitis-- improved since admission, pt reports back at baseline.  - LE arterial duplex yesterday reveal:  Right: Tandem lesions in the mid femoral artery. Monophasic waveforms. Left: Stenosis in the mid femoral artery. Biphasic waveforms. - pt does not wish to have any surgeries done b/c she is  immunocompromised, pt is assymptomatic, will have pt f/u with Dr. Scot Dock (vascular sx) as outpatient  Acute on chronic systolic CHF-- cardiology following, feel that pt was volume overloaded, Lungs CTAB - restart lasix 40mg  po  HCAP: repeat CXR reveals increased opacity over rt lung base 2/2 small effusion and airspace disease -ceftriaxone 1mg  IV q12h x 1 day, transition to po keflex as above  Fall, chronic back pain: lumbar xray on 11/13 negative for acute fractures, noted extensive lumbar spine degenerative disc disease - PT following -Continue home calcium carbonate 600mg  BID and vitamin D 2000u daily -Continue home gabapentin 100mg  TID -Norco 10/325mg  1 tablet Q6hr prn pain, may need extra doses for breakthrough -continue home methadone 10mg  BID - pain located mainly at lower rt flank area likely MSK, flexeril 5mg  TID prn for muscle spasm - d/c to SNF today  Atrial fibrillation on Eliquis: TSH wnl, troponin negative -pt in NSR -Continue home Eliquis 2.5mg  BID -continue bisoprolol 5mg  daily  DM2 -Accuchecks q AC, HS -SSI  HTN: normotensive -home losartan 25mg  daily held  Autoimmune hepatitis:  -continue home prednisone 10mg  daily -continue home sirolimus 1mg  every other day  FEN: Heart healthy/carb modified  DVT ppx: continue home Eliquis  Dispo: Disposition is deferred at this time, awaiting improvement of current medical problems.   The patient does have a current PCP (Imran Gertie Baron, MD) and does not need an [facility] hospital follow-up appointment after discharge. .Services Needed at time of discharge: Y = Yes, Blank = No PT:   SNF and 3in1.   OT:   RN:   Equipment:   Other:     LOS: 6 days   Julious Oka, MD 06/24/2014, 10:48 AM

## 2014-06-24 NOTE — Progress Notes (Signed)
Inpatient Diabetes Program Recommendations  AACE/ADA: New Consensus Statement on Inpatient Glycemic Control (2013)  Target Ranges:  Prepandial:   less than 140 mg/dL      Peak postprandial:   less than 180 mg/dL (1-2 hours)      Critically ill patients:  140 - 180 mg/dL  Results for TREASA, BRADSHAW (MRN [id number]) as of 06/24/2014 08:04  Ref. Range 06/23/2014 06:23 06/23/2014 11:11 06/23/2014 17:03 06/23/2014 21:11 06/24/2014 06:07  Glucose-Capillary Latest Range: 70-99 mg/dL 162 (H) 194 (H) 278 (H) 226 (H) 134 (H)   Elevated postprandial CBGs related to steroid therapy.  Inpatient Diabetes Program Recommendations Insulin - Meal Coverage: add Novolog 3 units TID with meals per Glycemic Control Order-set  Thank you  Raoul Pitch BSN, RN,CDE Inpatient Diabetes Coordinator [phone] (team pager)

## 2014-06-26 LAB — CULTURE, BLOOD (ROUTINE X 2)
CULTURE: NO GROWTH
Culture: NO GROWTH

## 2014-07-05 ENCOUNTER — Ambulatory Visit: Payer: Medicare Other | Admitting: Physician Assistant

## 2014-07-14 ENCOUNTER — Other Ambulatory Visit: Payer: Self-pay | Admitting: *Deleted

## 2014-07-14 DIAGNOSIS — I480 Paroxysmal atrial fibrillation: Secondary | ICD-10-CM

## 2014-07-14 DIAGNOSIS — R0602 Shortness of breath: Secondary | ICD-10-CM

## 2014-07-14 MED ORDER — GABAPENTIN 100 MG PO CAPS: 100.0000 mg | ORAL_CAPSULE | Freq: Three times a day (TID) | ORAL | Status: DC

## 2014-07-14 MED ORDER — FUROSEMIDE 40 MG PO TABS: 40.0000 mg | ORAL_TABLET | Freq: Every day | ORAL | Status: DC

## 2014-07-15 ENCOUNTER — Encounter (HOSPITAL_COMMUNITY): Payer: Self-pay | Admitting: Cardiovascular Disease

## 2014-08-02 ENCOUNTER — Telehealth: Payer: Self-pay | Admitting: Infectious Disease

## 2014-08-02 NOTE — Telephone Encounter (Signed)
Received phone call from MD at [facility] pt with recurrence of viridans group streptococcus (they had ID with more specific subspecies but same organism) [facility] but now with recurrence  I asked them to get TEE  If she ahs endocarditis she will need PCN and Gent x 4-6 weeks as MIC to PCN is >.5

## 2014-08-09 ENCOUNTER — Ambulatory Visit: Payer: Medicare Other | Admitting: Endocrinology

## 2014-08-30 ENCOUNTER — Encounter: Payer: Medicare Other | Admitting: Cardiology

## 2014-08-30 ENCOUNTER — Ambulatory Visit: Payer: Medicare Other | Admitting: Endocrinology

## 2014-09-13 ENCOUNTER — Encounter: Payer: Medicare Other | Admitting: Physician Assistant

## 2014-09-13 ENCOUNTER — Encounter: Payer: Self-pay | Admitting: Physician Assistant

## 2014-09-13 ENCOUNTER — Ambulatory Visit (INDEPENDENT_AMBULATORY_CARE_PROVIDER_SITE_OTHER): Payer: Medicare Other | Admitting: Physician Assistant

## 2014-09-13 VITALS — BP 124/68 | HR 102 | Ht 64.0 in | Wt 130.0 lb

## 2014-09-13 DIAGNOSIS — L03119 Cellulitis of unspecified part of limb: Secondary | ICD-10-CM

## 2014-09-13 DIAGNOSIS — I10 Essential (primary) hypertension: Secondary | ICD-10-CM

## 2014-09-13 DIAGNOSIS — R0602 Shortness of breath: Secondary | ICD-10-CM

## 2014-09-13 DIAGNOSIS — I5032 Chronic diastolic (congestive) heart failure: Secondary | ICD-10-CM

## 2014-09-13 DIAGNOSIS — I2510 Atherosclerotic heart disease of native coronary artery without angina pectoris: Secondary | ICD-10-CM

## 2014-09-13 DIAGNOSIS — I480 Paroxysmal atrial fibrillation: Secondary | ICD-10-CM

## 2014-09-13 MED ORDER — LOSARTAN POTASSIUM 25 MG PO TABS: ORAL_TABLET | ORAL | Status: DC

## 2014-09-13 MED ORDER — PRAVASTATIN SODIUM 80 MG PO TABS: 80.0000 mg | ORAL_TABLET | Freq: Every evening | ORAL | Status: DC

## 2014-09-13 MED ORDER — BISOPROLOL FUMARATE 10 MG PO TABS: ORAL_TABLET | ORAL | Status: DC

## 2014-09-13 MED ORDER — APIXABAN 2.5 MG PO TABS: 2.5000 mg | ORAL_TABLET | Freq: Two times a day (BID) | ORAL | Status: DC

## 2014-09-13 MED ORDER — FUROSEMIDE 40 MG PO TABS: 40.0000 mg | ORAL_TABLET | Freq: Every day | ORAL | Status: DC

## 2014-09-13 NOTE — Assessment & Plan Note (Signed)
Patient is back in atrial fibrillation with some fast rates. She is on Eliquis 2.5 mg twice a day. Cardioversion was scheduled for November but she was in normal sinus rhythm. I will increase her bisoprolol to 10 mg once daily for better rate control. I will discuss with Dr. Marigene Ehlers for further recommendations.

## 2014-09-13 NOTE — Assessment & Plan Note (Signed)
Controlled.  

## 2014-09-13 NOTE — Assessment & Plan Note (Signed)
Patient has had multiple hospitalizations for recurrent cellulitis. She is currently receiving IV antibiotics after her most recent hospitalization at [facility] in December. She should finish her 6 week treatment tomorrow. The likely source was from 2 teeth that were pulled.

## 2014-09-13 NOTE — Assessment & Plan Note (Signed)
No evidence of heart failure on exam today.

## 2014-09-13 NOTE — Patient Instructions (Addendum)
Your physician has recommended you make the following change in your medication:  1) INCREASE Bisoprolol to 10mg  daily  Take all other medications as prescribed  Your physician recommends that you schedule a follow-up appointment within 1 month with Dr.McLean

## 2014-09-13 NOTE — Progress Notes (Signed)
Cardiology Office Note   Date:  09/13/2014   ID:  Cynthia, Hicks Feb 06, 1933, MRN [id number]  PCP:  Bonnita Nasuti, MD  Cardiologist: Dr. Aundra Dubin   Chief complaint atrial fibrillation    History of Present Illness: Cynthia Hicks is a 80 y.o. female who presents for post hospital follow up.She has a history of CAD and autoimmune hepatitis. She was admitted in 1/13 with NSTEMI. Left heart cath showed 80% mid LAD stenosis and 99% mid CFX stenosis (likely culprit). EF was 40-45% by echo and LV-gram. She had a PROMUS DES to the CFX. No intervention to the LAD. Lexiscan myoview done subsequently in 2/13 showed EF 49% with no ischemia. Echo 4/13 showed EF around 45-50% with basal to mid inferior and mid-apical posterior akinesis along with mild MR. She was admitted in 12/13 at [facility] with severe cellulitis and sepsis syndrome. She was seen here by Truitt Merle later in 12/13 and found to be in atrial fibrillation. Coumadin was started and is followed by her PCP. In 3/14, Dr. Aundra Dubin cardioverted her to NSR. In 7/14, she had an echo showing EF 55-60% with basal inferior hypokinesis and a Lexiscan Cardiolite that was low risk with no ischemia.   In 06/2014 she was in atrial fibrillation and had been on Eliquis for over a mouth without missing a dose. He arranged for outpatient DCCV. When she arrived at the hospital for this she was in normal sinus rhythm but had a high fever and right lower extremity redness similar to her cellulitis in the past. She was treated with antibiotics for the cellulitis and healthcare associated pneumonia. TEE was performed on 06/22/14 showed normal LV size and function EF 55-60%, normal RV size and function, mild MR, trileaflet aortic valve with trivial regurgitation and no stenosis, moderately calcified. The left atrial appendage was large but no thrombus was seen. Negative bubble study so no ASD or PFO. There was a prominent grade 4 plaque in the descending  thoracic aorta. No vegetation was seen on any valve. There was no evidence of endocarditis.  She was also found to have tandem lesions in her right mid femoral artery and stenosis in the med left femoral artery. She is followed by Dr. Scot Dock for this.  Patient was readmitted to [facility] 07/28/14 through 08/03/14 with recurrent cellulitis and has been on IV antibiotics for the past 6 weeks. They believe the source of the infection was from 2 teeth that were pulled. Tomorrow will be her last day of antibiotics. Unfortunately she is back in atrial fibrillation and has been in it for a while. She has missed several appointments due to the weather. She complains of dyspnea on exertion. She can feel her heart beating fast but it was a little fast with walking in here.     Past Medical History  Diagnosis Date  . Rapid heart rate 11/04/2010  . Hypertension   . Arthritis   . Diabetes mellitus   . Coronary artery disease 2007    A.  MI 01/2003;  B.  02/2006 NSTEMI - LAD 80-4m LCX nl, RCA 959m RCA stented w/ Taxus DES;   C.  2009 Neg MV;   D. 11/2010 Echo - EF 65%;  E. 08/2011 - nstemi, MV @ [facility] and EF 37%  . Autoimmune liver disease     A.  Followed @ [facility] - on chronic prednisone and  sirolimus  . Hyperlipidemia     inability to  stake statins due to liver disease  . Cellulitis     chronic cellulitis of LE  . Chronic kidney disease   . Bulging disc   . Sciatica   . Chronic back pain     A.  On Methadone  . Skin cancer (melanoma)   . Esophageal stricture     narrowing  . Irregular heart beat   . Myocardial infarction 2007 and  Jan. 2012  . Diabetic neuropathy   . Autoimmune liver disease   . Sepsis     Past Surgical History  Procedure Laterality Date  . Cardiac catheterization      Normal EF  . Coronary angioplasty  2007    2 DES placement to RCA complicated by a wire perforation  . Back surgery  1992  . Tubal ligation  1982  . Elbow surgery  1994     neuroma removed left  . Abscess drainage      Right side  . Spine surgery    . Lamenectomy  1989  . Heart stents  2007  and  2012    X's 1  and  2012 X's 2 stents  . Cardioversion N/A 10/31/2012    Procedure: CARDIOVERSION;  Surgeon: Larey Dresser, MD;  Location: [facility] ENDOSCOPY;  Service: Cardiovascular;  Laterality: N/A;  . Tee without cardioversion N/A 06/22/2014    Procedure: TRANSESOPHAGEAL ECHOCARDIOGRAM (TEE);  Surgeon: Larey Dresser, MD;  Location: [facility] ENDOSCOPY;  Service: Cardiovascular;  Laterality: N/A;  . Left heart catheterization with coronary angiogram N/A 08/29/2011    Procedure: LEFT HEART CATHETERIZATION WITH CORONARY ANGIOGRAM;  Surgeon: Wellington Hampshire, MD;  Location: [facility] CATH LAB;  Service: Cardiovascular;  Laterality: N/A;  . Percutaneous coronary stent intervention (pci-s)  08/29/2011    Procedure: PERCUTANEOUS CORONARY STENT INTERVENTION (PCI-S);  Surgeon: Wellington Hampshire, MD;  Location: [facility] CATH LAB;  Service: Cardiovascular;;     Current Outpatient Prescriptions  Medication Sig Dispense Refill  . apixaban (ELIQUIS) 5 MG TABS tablet Take 1 tablet (5 mg total) by mouth 2 (two) times daily. (Patient taking differently: Take 2.5 mg by mouth 2 (two) times daily. ) 180 tablet 3  . Ascorbic Acid (VITAMIN C) 1000 MG tablet Take 1,000 mg by mouth 2 (two) times daily.     . bisoprolol (ZEBETA) 5 MG tablet TAKE 1 TABLET BY MOUTH EVERY DAY 90 tablet 1  . Blood Glucose Monitoring Suppl (ACCU-CHEK AVIVA PLUS) W/DEVICE KIT Use to check blood sugar 3 times per day dx code 250.00 1 kit 0  . calcium carbonate (OS-CAL) 600 MG TABS tablet Take 600 mg by mouth 2 (two) times daily with a meal.    . cephALEXin (KEFLEX) 500 MG capsule Take 1 capsule (500 mg total) by mouth 2 (two) times daily. 19 capsule 0  . Cholecalciferol (VITAMIN D PO) Take 2,000 Units by mouth at bedtime.     . Chromium 500 MCG TABS Take 1 tablet by mouth daily.      . cyclobenzaprine (FLEXERIL) 5 MG tablet Take 1  tablet (5 mg total) by mouth 3 (three) times daily as needed for muscle spasms. 30 tablet 0  . ezetimibe (ZETIA) 10 MG tablet Take 10 mg by mouth daily.      . fish oil-omega-3 fatty acids 1000 MG capsule Take 1 g by mouth 2 (two) times daily.     . furosemide (LASIX) 40 MG tablet Take 1 tablet (40 mg total) by mouth daily. 30 tablet 0  .  gabapentin (NEURONTIN) 100 MG capsule Take 1 capsule (100 mg total) by mouth 3 (three) times daily. 270 capsule 1  . glucose blood (ACCU-CHEK AVIVA PLUS) test strip Use as instructed to check blood sugar 3 times per day dx code 250.00 100 each 3  . HYDROcodone-acetaminophen (NORCO) 10-325 MG per tablet Take 1 tablet by mouth every 6 (six) hours as needed for severe pain. 30 tablet 0  . losartan (COZAAR) 25 MG tablet TAKE 1 TABLET BY MOUTH EVERY DAY. 90 tablet 1  . MAGNESIUM PO Take 125 mg by mouth daily.    . methadone (DOLOPHINE) 10 MG tablet Take 1 tablet (10 mg total) by mouth 2 (two) times daily. 30 tablet 0  . Multiple Vitamin (MULTIVITAMIN) capsule Take 1 capsule by mouth daily.      . nitroGLYCERIN (NITROSTAT) 0.4 MG SL tablet Place 1 tablet (0.4 mg total) under the tongue every 5 (five) minutes as needed for chest pain. 25 tablet 3  . NOVOLOG 100 UNIT/ML injection 15-17 at breakfast and lunch and 35-40 units at dinner    . omeprazole (PRILOSEC) 20 MG capsule Take 20 mg by mouth daily.     Glory Rosebush DELICA LANCETS FINE MISC Use to check blood sugar 3 times per day dx code 250.00 100 each 3  . oxyCODONE-acetaminophen (PERCOCET/ROXICET) 5-325 MG per tablet Take 1 tablet by mouth every 8 (eight) hours as needed for moderate pain. 30 tablet 0  . pravastatin (PRAVACHOL) 80 MG tablet Take 1 tablet (80 mg total) by mouth every evening. 90 tablet 1  . predniSONE (DELTASONE) 10 MG tablet Take 10 mg by mouth daily.      . Probiotic Product (PROBIOTIC DAILY PO) Take 1 tablet by mouth daily.     . Selenium 200 MCG CAPS Take 1 capsule by mouth daily.      . sirolimus  (RAPAMUNE) 1 MG tablet Take 1 mg by mouth every other day.       No current facility-administered medications for this visit.    Allergies:   Cyclosporine; Azathioprine; Mycophenolate; and Other    Social History:  The patient  reports that she has never smoked. She has never used smokeless tobacco. She reports that she does not drink alcohol or use illicit drugs.   Family History:  The patient's family history includes Congestive Heart Failure in her father; Heart attack (age of onset: 57) in her father; Heart disease in her father and maternal grandfather; Hypertension in her brother and son; Lung cancer (age of onset: 30) in her mother; Pancreatic cancer in her mother.    ROS:  Please see the history of present illness.   Otherwise, review of systems are positive for chronic edema lower extremities   All other systems are reviewed and negative.    PHYSICAL EXAM: BP 124/68 mmHg  Pulse 102  Ht 5' 4" (1.626 m)  Wt 130 lb (58.968 kg)  BMI 22.30 kg/m2 GEN: Well nourished, well developed, in no acute distress HEENT: normal Neck: no JVD, carotid bruits, or masses Cardiac: Irregular irregular; no murmurs, rubs, or gallops, Respiratory:  clear to auscultation bilaterally, normal work of breathing GI: soft, nontender, nondistended, + BS Extremities: Bilateral lower extremity edema with erythema up to her knees, decreased distal pulses bilaterally   EKG:  EKG is ordered today. The ekg ordered today demonstrates atrial fibrillation at 102 bpm with nonspecific ST-T wave changes   Recent Labs: 06/14/2014: Pro B Natriuretic peptide (BNP) 592.0* 06/18/2014: ALT 16; TSH 3.550  06/20/2014: Hemoglobin 9.6*; Platelets 125* 06/23/2014: BUN 37*; Creatinine 1.38*; Potassium 5.0; Sodium 140    Lipid Panel    Component Value Date/Time   CHOL 202* 05/27/2012 1031   TRIG 134.0 05/27/2012 1031   HDL 87.10 05/27/2012 1031   CHOLHDL 2 05/27/2012 1031   VLDL 26.8 05/27/2012 1031   LDLCALC 192  08/04/2009 0000   LDLDIRECT 99.8 05/27/2012 1031      Wt Readings from Last 3 Encounters:  06/14/14 131 lb 12.8 oz (59.784 kg)  06/07/14 129 lb (58.514 kg)  04/09/14 132 lb (59.875 kg)     Signed, Ermalinda Barrios, PA-C  09/13/2014 1:57 PM    [facility] [place], [place], [place]  [address] Phone: [phone]; Fax: [phone]

## 2014-09-13 NOTE — Assessment & Plan Note (Signed)
Stable without chest pain 

## 2014-09-16 ENCOUNTER — Telehealth: Payer: Self-pay | Admitting: Cardiology

## 2014-09-16 NOTE — Telephone Encounter (Signed)
Spoke with pt about appt Monday February 15,2016 8AM with Dr Aundra Dubin per Dr Aundra Dubin.

## 2014-09-16 NOTE — Telephone Encounter (Signed)
New Message  Pt returning Anne's phone call. Please call back and discuss,.

## 2014-09-17 ENCOUNTER — Encounter: Payer: Medicare Other | Admitting: Physician Assistant

## 2014-09-20 ENCOUNTER — Ambulatory Visit: Payer: Medicare Other | Admitting: Cardiology

## 2014-10-11 ENCOUNTER — Encounter: Payer: Self-pay | Admitting: Cardiology

## 2014-10-11 ENCOUNTER — Ambulatory Visit (INDEPENDENT_AMBULATORY_CARE_PROVIDER_SITE_OTHER): Payer: Medicare Other | Admitting: Cardiology

## 2014-10-11 VITALS — BP 114/50 | HR 81 | Ht 64.0 in | Wt 131.0 lb

## 2014-10-11 DIAGNOSIS — N183 Chronic kidney disease, stage 3 unspecified: Secondary | ICD-10-CM

## 2014-10-11 DIAGNOSIS — I481 Persistent atrial fibrillation: Secondary | ICD-10-CM | POA: Diagnosis not present

## 2014-10-11 DIAGNOSIS — I5022 Chronic systolic (congestive) heart failure: Secondary | ICD-10-CM

## 2014-10-11 DIAGNOSIS — I2510 Atherosclerotic heart disease of native coronary artery without angina pectoris: Secondary | ICD-10-CM

## 2014-10-11 DIAGNOSIS — I4819 Other persistent atrial fibrillation: Secondary | ICD-10-CM

## 2014-10-11 LAB — CBC WITH DIFFERENTIAL/PLATELET
BASOS ABS: 0 10*3/uL (ref 0.0–0.1)
BASOS PCT: 0.4 % (ref 0.0–3.0)
EOS ABS: 0 10*3/uL (ref 0.0–0.7)
Eosinophils Relative: 0.2 % (ref 0.0–5.0)
HCT: 37 % (ref 36.0–46.0)
Hemoglobin: 11.9 g/dL — ABNORMAL LOW (ref 12.0–15.0)
LYMPHS PCT: 10.6 % — AB (ref 12.0–46.0)
Lymphs Abs: 1.2 10*3/uL (ref 0.7–4.0)
MCHC: 32.3 g/dL (ref 30.0–36.0)
MCV: 89 fl (ref 78.0–100.0)
Monocytes Absolute: 1.2 10*3/uL — ABNORMAL HIGH (ref 0.1–1.0)
Monocytes Relative: 10.7 % (ref 3.0–12.0)
Neutro Abs: 9 10*3/uL — ABNORMAL HIGH (ref 1.4–7.7)
Neutrophils Relative %: 78.1 % — ABNORMAL HIGH (ref 43.0–77.0)
Platelets: 190 10*3/uL (ref 150.0–400.0)
RBC: 4.15 Mil/uL (ref 3.87–5.11)
RDW: 21.8 % — ABNORMAL HIGH (ref 11.5–15.5)
WBC: 11.6 10*3/uL — ABNORMAL HIGH (ref 4.0–10.5)

## 2014-10-11 LAB — BASIC METABOLIC PANEL
BUN: 31 mg/dL — AB (ref 6–23)
CALCIUM: 9.1 mg/dL (ref 8.4–10.5)
CO2: 35 mEq/L — ABNORMAL HIGH (ref 19–32)
Chloride: 101 mEq/L (ref 96–112)
Creatinine, Ser: 1.66 mg/dL — ABNORMAL HIGH (ref 0.40–1.20)
GFR: 31.66 mL/min — AB (ref >60.00)
Glucose, Bld: 80 mg/dL (ref 70–99)
POTASSIUM: 4 meq/L (ref 3.5–5.1)
SODIUM: 141 meq/L (ref 135–145)

## 2014-10-11 LAB — LIPID PANEL
Cholesterol: 170 mg/dL (ref 0–200)
HDL: 73 mg/dL (ref >39.00)
NonHDL: 97
TRIGLYCERIDES: 206 mg/dL — AB (ref 0.0–149.0)
Total CHOL/HDL Ratio: 2
VLDL: 41.2 mg/dL — ABNORMAL HIGH (ref 0.0–40.0)

## 2014-10-11 LAB — BRAIN NATRIURETIC PEPTIDE: PRO B NATRI PEPTIDE: 520 pg/mL — AB (ref 0.0–100.0)

## 2014-10-11 LAB — LDL CHOLESTEROL, DIRECT: Direct LDL: 73 mg/dL

## 2014-10-11 LAB — MAGNESIUM: MAGNESIUM: 2.5 mg/dL (ref 1.5–2.5)

## 2014-10-11 MED ORDER — LOSARTAN POTASSIUM 25 MG PO TABS: ORAL_TABLET | ORAL | Status: DC

## 2014-10-11 MED ORDER — FUROSEMIDE 40 MG PO TABS: ORAL_TABLET | ORAL | Status: DC

## 2014-10-11 MED ORDER — PRAVASTATIN SODIUM 80 MG PO TABS: 80.0000 mg | ORAL_TABLET | Freq: Every evening | ORAL | Status: DC

## 2014-10-11 MED ORDER — BISOPROLOL FUMARATE 5 MG PO TABS: 5.0000 mg | ORAL_TABLET | Freq: Every day | ORAL | Status: DC

## 2014-10-11 NOTE — Patient Instructions (Signed)
Increase lasix (furosemide) to 40mg  in the AM and 20mg  in the PM. This will be 1 of your 40mg  tablets in the AM and 1/2 of your 40mg  tablets in the PM.  Your physician recommends that you have lab today--BMET/BNP/Lipid profile/Magnesium level/CBCd.  Your physician recommends that you return for lab work in: 2 weeks--BMET.  Your physician recommends that you schedule a follow-up appointment in: 3 months with Dr Aundra Dubin.   You have been referred to the [facility], pharmacist to make arrangements for Tikosyn.  You should expect a call from her in the next day or so--call [phone] if you do not hear from her in a few days.

## 2014-10-11 NOTE — Progress Notes (Addendum)
Patient ID: Cynthia Hicks, female   DOB: 14-Aug-1932, 81 y.o.   MRN: [id number] PCP: Dr. Celedonio Miyamoto  81 yo with history of CAD and autoimmune hepatitis presents for cardiology followup.  She was admitted in 1/13 with NSTEMI.  Left heart cath showed 80% mid LAD stenosis and 99% mid CFX stenosis (likely culprit).  EF was 40-45% by echo and LV-gram.  She had a PROMUS DES to the CFX.  No intervention to the LAD.   Lexiscan myoview done subsequently in 2/13 showed EF 49% with no ischemia.  Echo 4/13 showed EF around 45-50% with basal to mid inferior and mid-apical posterior akinesis along with mild MR.  She was admitted in 12/13 at [facility] with severe cellulitis and sepsis syndrome.  She was seen here by Truitt Merle later in 12/13 and found to be in atrial fibrillation.  Coumadin was started initially, but now she is on Eliquis. In 3/14, I cardioverted her to NSR.  In 7/14, she had an echo showing EF 55-60% with basal inferior hypokinesis and a Lexiscan Cardiolite that was low risk with no ischemia.  She went back into atrial fibrillation in 11/15, and I sent her for DCCV again.  However, she was back in NSR when she showed up for DCCV.  She had to be admitted at that time in 11/15 for PNA.  TEE was done in 11/15 during the PNA admission showing EF 55-60%, mild MR. She was also found to have significant PAD during 11/15 hospitalization.  She was back in atrial fibrillation at her 2/16 appt and is in atrial fibrillation today.   She is very limited by back pain and dyspnea.  She is kyphotic and has significant low back pain. She does not feel palpitations.  She uses a walker.  She is short of breath walking longer distances.  No chest pain.  She sleeps in a chair because of back pain.  No definite claudication but not very active.   ECG: atrial fibrillation, LAFB, lateral TWIs, QTc 427  Labs (4/13): LDL 65, HDL 101, K 3.7, creatinine 1.46, AST 26, ALT 38 Labs (5/13): K 4.4, creatinine 1.54 Labs (10/13): LDL 100,  HDL 87 Labs (1/14): K 4.3, creatinine 1.76 Labs (2/14): LDL 65, HDL 109, TGs 111 Labs (4/14): creatinine 1.4 => 1.47, K 4.1, BNP 381 Labs (5/14): K 4.6, creatinine 1.7 Labs (10/14): LDL 68, HDL 80 Labs (6/15): K 4.1, creatinine 1.18 Labs (9/15): HCT 35.2, K 4.3, creatinine 1.9 Labs (11/15): K 5, creatinine 1.38, AST 23, ALT 16  PMH: 1. CAD: NSTEMI 1/07.  LHC showed 80-90% mid LAD stenosis and 95% mid RCA stenosis.  She had Taxus DES x 2 (overlapping) to the RCA.  Procedure was complicated by wire perforation of PLV, PLV was coil embolized.  Myoview 2009 showed no ischemia or infarction.  NSTEMI 1/13.  LHC with 80% mid LAD (same as 2007), 99% mid CFX, 70% distal RCA.  PROMUS DES to CFX.  Lexiscan myoview (2/13, to assess LAD territory): EF 49%, no ischemia.  Lexiscan Cardiolite (7/14) with EF 48%, no ischemia.  2. Hyperlipidemia: Unable to take Crestor or Lipitor due to leg weakness.  3. Autoimmune hepatitis: Followed at [facility].  On prednisone and sirolimus.   4. Lumbar disc disease/sciatica.  5. CKD 6. Chronic venous insufficiency with chronic R>L lower leg swelling.  7. PAD: Arterial dopplers (11/15) with tandom significant stenoses in the left mid femoral artery, significant stenosis right mid femoral artery.   8. SVT: suppressed  by beta blocker.  9. Ischemic cardiomyopathy: Echo (1/13) with EF 40-45%.  EF 40% by LV-gram (1/13).  Echo (4/13): EF 45-50% with basal to mid inferior and mid to apical posterior akinesis and moderate MR.  Echo (7/14) with EF 55-60%, basal inferior HK, mild LVH.  TEE (11/15) with EF 55-60%, normal RV size and systolic function, mild MR, no LAA thrombus.  10. Carotid dopplers (12/12) with minimal disease.  11. Cholelithiasis/choledocholithiasis s/p ERCP and sphincterotomy in 2013.  12. Atrial fibrillation: First noted in 12/13.  DCCV 3/14. Back in atrial fibrillation in 9/15.  13. Frozen shoulder on right.   SH: Lives in [place], widowed, retired Radiation protection practitioner.  Never smoked.    FH: No premature CAD.    ROS: All systems reviewed and negative except as per HPI.   Current Outpatient Prescriptions  Medication Sig Dispense Refill  . apixaban (ELIQUIS) 2.5 MG TABS tablet Take 1 tablet (2.5 mg total) by mouth 2 (two) times daily.    . Ascorbic Acid (VITAMIN C) 1000 MG tablet Take 1,000 mg by mouth 2 (two) times daily.     . bisoprolol (ZEBETA) 5 MG tablet Take 1 tablet (5 mg total) by mouth daily. 90 tablet 3  . Blood Glucose Monitoring Suppl (ACCU-CHEK AVIVA PLUS) W/DEVICE KIT Use to check blood sugar 3 times per day dx code 250.00 1 kit 0  . calcium carbonate (OS-CAL) 600 MG TABS tablet Take 600 mg by mouth 2 (two) times daily with a meal.    . Cholecalciferol (VITAMIN D PO) Take 2,000 Units by mouth at bedtime.     . Chromium 500 MCG TABS Take 1 tablet by mouth daily.      Marland Kitchen ezetimibe (ZETIA) 10 MG tablet Take 10 mg by mouth daily.      . fish oil-omega-3 fatty acids 1000 MG capsule Take 1 g by mouth 2 (two) times daily.     Marland Kitchen gabapentin (NEURONTIN) 100 MG capsule Take 1 capsule (100 mg total) by mouth 3 (three) times daily. 270 capsule 1  . glucose blood (ACCU-CHEK AVIVA PLUS) test strip Use as instructed to check blood sugar 3 times per day dx code 250.00 100 each 3  . insulin aspart (NOVOLOG) 100 UNIT/ML injection Inject 8-15 Units into the skin 3 (three) times daily with meals. Rely R  03-13-14    . losartan (COZAAR) 25 MG tablet TAKE 1 TABLET BY MOUTH EVERY DAY. 90 tablet 3  . MAGNESIUM PO Take 125 mg by mouth daily.    . methadone (DOLOPHINE) 10 MG tablet Take 1 tablet (10 mg total) by mouth 2 (two) times daily. 30 tablet 0  . Multiple Vitamin (MULTIVITAMIN) capsule Take 1 capsule by mouth daily.      . nitroGLYCERIN (NITROSTAT) 0.4 MG SL tablet Place 1 tablet (0.4 mg total) under the tongue every 5 (five) minutes as needed for chest pain. 25 tablet 3  . omeprazole (PRILOSEC) 20 MG capsule Take 20 mg by mouth daily.     Glory Rosebush DELICA  LANCETS FINE MISC Use to check blood sugar 3 times per day dx code 250.00 100 each 3  . oxyCODONE (OXY IR/ROXICODONE) 5 MG immediate release tablet Take 1 tablet by mouth as directed.  0  . pravastatin (PRAVACHOL) 80 MG tablet Take 1 tablet (80 mg total) by mouth every evening. 90 tablet 3  . predniSONE (DELTASONE) 10 MG tablet Take 10 mg by mouth daily.      . Probiotic Product (  PROBIOTIC DAILY PO) Take 1 tablet by mouth daily.     . Selenium 200 MCG CAPS Take 1 capsule by mouth daily.      . sirolimus (RAPAMUNE) 1 MG tablet Take 1 mg by mouth every other day.      . furosemide (LASIX) 40 MG tablet 1 tablet (47m) by mouth in the AM and 1/2 tablet (257m by mouth in the PM 135 tablet 3   No current facility-administered medications for this visit.   BP 114/50 mmHg  Pulse 81  Ht 5' 4" (1.626 m)  Wt 131 lb (59.421 kg)  BMI 22.47 kg/m2 General: NAD Neck: JVP 8 cm, no thyromegaly or thyroid nodule.  Lungs: Clear to auscultation bilaterally with normal respiratory effort. CV: Nondisplaced PMI.  Heart regular S1/S2, no S3/S4, no murmur.  1+ edema 3/4 to knees bilaterally.  I cannot palpate pedal pulses.  Abdomen: Soft, nontender, no hepatosplenomegaly, no distention.   Neurologic: Alert and oriented x 3.  Psych: Normal affect. Extremities: No clubbing or cyanosis. Venous stasis changes lower legs bilaterally.   Assessment/Plan:  Chronic kidney disease  Relatively stable.  Repeat BMET today.    Coronary artery disease  NSTEMI with Promus DES to CFX in 1/13. Has residual 80% mid LAD stenosis. This was also seen on 2007 cath, and Lexiscan myoview in 7/14 showed no ischemia.  No chest pain.  - She is anticoagulated with stable CAD, so not on ASA.  - She will continue ARB, beta blocker, and statin.  HYPERLIPIDEMIA Check lipids today.  Chronic diastolic CHF EF 5533-00%n 11/15 TEE.  NYHA class III symptoms.  She is volume overloaded on exam.  - Increase Lasix to 40 qam/20 qpm.     -  BMET/BNP today and in 2 wks.   Atrial fibrillation She remains in atrial fibrillation today.  She has been on Eliquis for > 1 month without missing doses.  She is on 2.5 mg bid.  I think that she is more symptomatic with CHF when she is in atrial fibrillation.  I do not think she will hold NSR without anti-arrhythmic.   - Continue Eliquis, check CBC today.  - I would avoid amiodarone given her history of autoimmune hepatitis.  - I think that she could be on Tikosyn at reduced dose (for renal function, would likely use 125 mcg bid).  [facility]  If everything looks ok, will admit Tikosyn initiation.  PAD She has followup scheduled with [facility] (Dr DiScot Dock   DaLoralie Champagne/02/2015

## 2014-10-13 NOTE — Addendum Note (Signed)
Addended by: Katrine Coho on: 10/13/2014 08:52 AM   Modules accepted: Orders

## 2014-10-17 ENCOUNTER — Other Ambulatory Visit: Payer: Self-pay | Admitting: Endocrinology

## 2014-10-19 ENCOUNTER — Telehealth: Payer: Self-pay | Admitting: Cardiology

## 2014-10-19 ENCOUNTER — Encounter (HOSPITAL_COMMUNITY): Payer: Self-pay | Admitting: General Practice

## 2014-10-19 ENCOUNTER — Ambulatory Visit (INDEPENDENT_AMBULATORY_CARE_PROVIDER_SITE_OTHER): Payer: Medicare Other | Admitting: Pharmacist

## 2014-10-19 ENCOUNTER — Inpatient Hospital Stay (HOSPITAL_COMMUNITY)
Admission: AD | Admit: 2014-10-19 | Discharge: 2014-10-22 | DRG: 309 | Disposition: A | Discharge status: Home / Self Care | Payer: Medicare Other | Source: Ambulatory Visit | Attending: Interventional Cardiology | Admitting: Interventional Cardiology

## 2014-10-19 DIAGNOSIS — Z79899 Other long term (current) drug therapy: Secondary | ICD-10-CM | POA: Diagnosis not present

## 2014-10-19 DIAGNOSIS — M549 Dorsalgia, unspecified: Secondary | ICD-10-CM | POA: Diagnosis present

## 2014-10-19 DIAGNOSIS — I252 Old myocardial infarction: Secondary | ICD-10-CM

## 2014-10-19 DIAGNOSIS — M1990 Unspecified osteoarthritis, unspecified site: Secondary | ICD-10-CM | POA: Diagnosis present

## 2014-10-19 DIAGNOSIS — Z85820 Personal history of malignant melanoma of skin: Secondary | ICD-10-CM | POA: Diagnosis not present

## 2014-10-19 DIAGNOSIS — Z5181 Encounter for therapeutic drug level monitoring: Secondary | ICD-10-CM | POA: Insufficient documentation

## 2014-10-19 DIAGNOSIS — I480 Paroxysmal atrial fibrillation: Secondary | ICD-10-CM

## 2014-10-19 DIAGNOSIS — I4819 Other persistent atrial fibrillation: Secondary | ICD-10-CM

## 2014-10-19 DIAGNOSIS — Z7952 Long term (current) use of systemic steroids: Secondary | ICD-10-CM | POA: Diagnosis not present

## 2014-10-19 DIAGNOSIS — E1140 Type 2 diabetes mellitus with diabetic neuropathy, unspecified: Secondary | ICD-10-CM | POA: Diagnosis present

## 2014-10-19 DIAGNOSIS — I2510 Atherosclerotic heart disease of native coronary artery without angina pectoris: Secondary | ICD-10-CM | POA: Diagnosis present

## 2014-10-19 DIAGNOSIS — E119 Type 2 diabetes mellitus without complications: Secondary | ICD-10-CM

## 2014-10-19 DIAGNOSIS — G8929 Other chronic pain: Secondary | ICD-10-CM | POA: Diagnosis present

## 2014-10-19 DIAGNOSIS — Z794 Long term (current) use of insulin: Secondary | ICD-10-CM | POA: Diagnosis not present

## 2014-10-19 DIAGNOSIS — Z955 Presence of coronary angioplasty implant and graft: Secondary | ICD-10-CM

## 2014-10-19 DIAGNOSIS — I129 Hypertensive chronic kidney disease with stage 1 through stage 4 chronic kidney disease, or unspecified chronic kidney disease: Secondary | ICD-10-CM | POA: Diagnosis present

## 2014-10-19 DIAGNOSIS — N189 Chronic kidney disease, unspecified: Secondary | ICD-10-CM | POA: Diagnosis present

## 2014-10-19 DIAGNOSIS — K754 Autoimmune hepatitis: Secondary | ICD-10-CM | POA: Diagnosis present

## 2014-10-19 DIAGNOSIS — Z7901 Long term (current) use of anticoagulants: Secondary | ICD-10-CM

## 2014-10-19 DIAGNOSIS — E785 Hyperlipidemia, unspecified: Secondary | ICD-10-CM | POA: Diagnosis present

## 2014-10-19 DIAGNOSIS — I5032 Chronic diastolic (congestive) heart failure: Secondary | ICD-10-CM | POA: Diagnosis present

## 2014-10-19 DIAGNOSIS — Z79891 Long term (current) use of opiate analgesic: Secondary | ICD-10-CM | POA: Diagnosis not present

## 2014-10-19 DIAGNOSIS — I481 Persistent atrial fibrillation: Secondary | ICD-10-CM | POA: Diagnosis present

## 2014-10-19 DIAGNOSIS — I4891 Unspecified atrial fibrillation: Secondary | ICD-10-CM | POA: Diagnosis present

## 2014-10-19 LAB — BASIC METABOLIC PANEL
ANION GAP: 7 (ref 5–15)
BUN: 41 mg/dL — ABNORMAL HIGH (ref 6–23)
BUN: 45 mg/dL — ABNORMAL HIGH (ref 6–23)
CALCIUM: 9 mg/dL (ref 8.4–10.5)
CO2: 37 meq/L — AB (ref 19–32)
CO2: 32 mmol/L (ref 19–32)
CREATININE: 1.98 mg/dL — AB (ref 0.50–1.10)
Calcium: 9.6 mg/dL (ref 8.4–10.5)
Chloride: 99 mEq/L (ref 96–112)
Chloride: 98 mmol/L (ref 96–112)
Creatinine, Ser: 1.82 mg/dL — ABNORMAL HIGH (ref 0.40–1.20)
GFR, EST AFRICAN AMERICAN: 26 mL/min — AB (ref >90)
GFR, EST NON AFRICAN AMERICAN: 23 mL/min — AB (ref >90)
GFR: 28.47 mL/min — AB (ref >60.00)
GLUCOSE: 105 mg/dL — AB (ref 70–99)
Glucose, Bld: 278 mg/dL — ABNORMAL HIGH (ref 70–99)
Potassium: 4 mEq/L (ref 3.5–5.1)
Potassium: 6 mmol/L — ABNORMAL HIGH (ref 3.5–5.1)
SODIUM: 141 meq/L (ref 135–145)
Sodium: 137 mmol/L (ref 135–145)

## 2014-10-19 LAB — MAGNESIUM
Magnesium: 2.5 mg/dL (ref 1.5–2.5)
Magnesium: 2.4 mg/dL (ref 1.5–2.5)

## 2014-10-19 MED ORDER — CALCIUM CARBONATE 600 MG PO TABS: 600.0000 mg | ORAL_TABLET | Freq: Two times a day (BID) | ORAL | Status: DC

## 2014-10-19 MED ORDER — APIXABAN 2.5 MG PO TABS
2.5000 mg | ORAL_TABLET | Freq: Two times a day (BID) | ORAL | Status: DC
  Administered 2014-10-19 – 2014-10-22 (×6): 2.5 mg via ORAL
  Filled 2014-10-19 (×7): qty 1

## 2014-10-19 MED ORDER — OMEGA-3-ACID ETHYL ESTERS 1 G PO CAPS
1.0000 g | ORAL_CAPSULE | Freq: Two times a day (BID) | ORAL | Status: DC
  Administered 2014-10-19 – 2014-10-22 (×6): 1 g via ORAL
  Filled 2014-10-19 (×7): qty 1

## 2014-10-19 MED ORDER — METHADONE HCL 10 MG PO TABS
10.0000 mg | ORAL_TABLET | Freq: Two times a day (BID) | ORAL | Status: DC
  Administered 2014-10-19 – 2014-10-22 (×6): 10 mg via ORAL
  Filled 2014-10-19 (×6): qty 1

## 2014-10-19 MED ORDER — SODIUM CHLORIDE 0.9 % IJ SOLN
3.0000 mL | Freq: Two times a day (BID) | INTRAMUSCULAR | Status: DC
  Administered 2014-10-20 – 2014-10-21 (×4): 3 mL via INTRAVENOUS

## 2014-10-19 MED ORDER — PANTOPRAZOLE SODIUM 40 MG PO TBEC
40.0000 mg | DELAYED_RELEASE_TABLET | Freq: Every day | ORAL | Status: DC
  Administered 2014-10-20 – 2014-10-22 (×3): 40 mg via ORAL
  Filled 2014-10-19 (×3): qty 1

## 2014-10-19 MED ORDER — SIROLIMUS 1 MG PO TABS
1.0000 mg | ORAL_TABLET | ORAL | Status: DC
  Filled 2014-10-19 (×2): qty 1

## 2014-10-19 MED ORDER — OMEGA-3 FATTY ACIDS 1000 MG PO CAPS: 1.0000 g | ORAL_CAPSULE | Freq: Two times a day (BID) | ORAL | Status: DC

## 2014-10-19 MED ORDER — SODIUM CHLORIDE 0.9 % IV SOLN: 250.0000 mL | INTRAVENOUS | Status: DC | PRN

## 2014-10-19 MED ORDER — BISOPROLOL FUMARATE 5 MG PO TABS
5.0000 mg | ORAL_TABLET | Freq: Every day | ORAL | Status: DC
  Administered 2014-10-22: 5 mg via ORAL
  Filled 2014-10-19 (×3): qty 1

## 2014-10-19 MED ORDER — MAGNESIUM 200 MG PO TABS
125.0000 mg | ORAL_TABLET | Freq: Every day | ORAL | Status: DC
Start: 2014-10-19 — End: 2014-10-19

## 2014-10-19 MED ORDER — GABAPENTIN 100 MG PO CAPS
100.0000 mg | ORAL_CAPSULE | Freq: Three times a day (TID) | ORAL | Status: DC
  Administered 2014-10-19 – 2014-10-22 (×8): 100 mg via ORAL
  Filled 2014-10-19 (×10): qty 1

## 2014-10-19 MED ORDER — INSULIN ASPART 100 UNIT/ML ~~LOC~~ SOLN
0.0000 [IU] | Freq: Three times a day (TID) | SUBCUTANEOUS | Status: DC
  Administered 2014-10-20: 4 [IU] via SUBCUTANEOUS
  Administered 2014-10-20: 11 [IU] via SUBCUTANEOUS
  Administered 2014-10-21: 4 [IU] via SUBCUTANEOUS
  Administered 2014-10-21 – 2014-10-22 (×2): 7 [IU] via SUBCUTANEOUS

## 2014-10-19 MED ORDER — DOFETILIDE 125 MCG PO CAPS
125.0000 ug | ORAL_CAPSULE | Freq: Two times a day (BID) | ORAL | Status: DC
  Administered 2014-10-19 – 2014-10-22 (×6): 125 ug via ORAL
  Filled 2014-10-19 (×8): qty 1

## 2014-10-19 MED ORDER — PREDNISONE 10 MG PO TABS
10.0000 mg | ORAL_TABLET | Freq: Every day | ORAL | Status: DC
  Administered 2014-10-20 – 2014-10-22 (×3): 10 mg via ORAL
  Filled 2014-10-19 (×4): qty 1

## 2014-10-19 MED ORDER — NITROGLYCERIN 0.4 MG SL SUBL: 0.4000 mg | SUBLINGUAL_TABLET | SUBLINGUAL | Status: DC | PRN

## 2014-10-19 MED ORDER — EZETIMIBE 10 MG PO TABS
10.0000 mg | ORAL_TABLET | Freq: Every day | ORAL | Status: DC
  Administered 2014-10-20 – 2014-10-22 (×3): 10 mg via ORAL
  Filled 2014-10-19 (×3): qty 1

## 2014-10-19 MED ORDER — SODIUM CHLORIDE 0.9 % IJ SOLN: 3.0000 mL | INTRAMUSCULAR | Status: DC | PRN

## 2014-10-19 MED ORDER — CALCIUM CARBONATE 1250 (500 CA) MG PO TABS
1.0000 | ORAL_TABLET | Freq: Two times a day (BID) | ORAL | Status: DC
  Administered 2014-10-20 – 2014-10-22 (×5): 500 mg via ORAL
  Filled 2014-10-19 (×7): qty 1

## 2014-10-19 MED ORDER — SODIUM CHLORIDE 0.9 % IV SOLN: INTRAVENOUS | Status: DC

## 2014-10-19 MED ORDER — MAGNESIUM OXIDE 400 (241.3 MG) MG PO TABS
200.0000 mg | ORAL_TABLET | Freq: Every day | ORAL | Status: DC
Start: 2014-10-20 — End: 2014-10-22
  Administered 2014-10-20 – 2014-10-21 (×2): 200 mg via ORAL
  Filled 2014-10-19 (×3): qty 0.5

## 2014-10-19 MED ORDER — FUROSEMIDE 40 MG PO TABS
40.0000 mg | ORAL_TABLET | Freq: Every day | ORAL | Status: DC
  Administered 2014-10-20 – 2014-10-22 (×3): 40 mg via ORAL
  Filled 2014-10-19 (×3): qty 1

## 2014-10-19 MED ORDER — PRAVASTATIN SODIUM 80 MG PO TABS
80.0000 mg | ORAL_TABLET | Freq: Every evening | ORAL | Status: DC
  Administered 2014-10-19 – 2014-10-21 (×3): 80 mg via ORAL
  Filled 2014-10-19 (×4): qty 1

## 2014-10-19 NOTE — Telephone Encounter (Signed)
Spoke with pt.  Labs were acceptable to start Tikosyn.  She has been placed into the system and is awaiting an open bed.

## 2014-10-19 NOTE — Telephone Encounter (Signed)
New msg      Pt calling, states she is supposed to be admitted into the hospital today and she is waiting on a call about this?   Please return pt call.

## 2014-10-19 NOTE — Assessment & Plan Note (Signed)
Reviewed pt's labs.  K- 4.0, Mg 2.5.  QTc was acceptable on today's EKG.  Okay to start Tikosyn.  CrCl- 3mL/min.  Would start at 161mcg BID.  Pt is aware of results and is at the hospital awaiting admission.

## 2014-10-19 NOTE — H&P (Signed)
Patient ID: TAIZ BICKLE MRN: [id number], DOB/AGE: January 23, 1933   Admit date: 10/19/2014   Primary Physician: Bonnita Nasuti, MD Primary Cardiologist: Dr. Aundra Dubin   Pt. Profile:  Cynthia Hicks is a 81 y.o. female with a history of AFib on eliquis, CAD, CHF and autoimmune hepatitis who was admitted to [facility] today for Tikosyn loading.    She was last seen by Dr. Aundra Dubin on 10/11/14. She has a PMH significant for Afib (s/p DCCV in 3/14), CAD, CHF, and autoimmune hepatitis. At her visit in March with Dr. Aundra Dubin, she was noted to be in atrial fibrillation. Pt does not feel any palpitations when in afib but she does seem to be more symptomatic with CHF when in afib. Plan was to attempt restoring NSR at that time. Dr. Aundra Dubin discussed options with patient and given her other comorbidities, plan was to start Tikosyn.   Pt reported having incident where she went to ER at [facility] yesterday. She was at her eye doctor when she had an episode of diaphoresis and increase in SOB. She was concerned it was related to her heart. She states they did an EKG, chest x-ray, labs and urinalysis and everything was normal. Her blood sugar was 103 so she believes it may have been an episode of hypoglycemia. She states she feels fine today.   Reviewed pt's chart. She is currently not taking any contraindicated medications. She is taking methadone, which can prolong QTc. This has been a chronic medication for her and QTc is stable today. She has never taken any other antiarrhythmics. She is adequately anticoagulated with Eliquis 2.62m BID (dose reduced for weight and SCr) and reports compliance over the last 30 days.   Spoke with insurance company regarding cost. Tikosyn is covered at a Tier 3 with no PA required. Her copay will be approximately $43 per month.   EKG reviewed by Dr. TLovena Le Atrial fibrillation with vent rate of 85 bpm. QTc 423 msec.     Problem List  Past Medical History   Diagnosis Date  . Rapid heart rate 11/04/2010  . Hypertension   . Arthritis   . Diabetes mellitus   . Coronary artery disease 2007    A.  MI 01/2003;  B.  02/2006 NSTEMI - LAD 80-991mLCX nl, RCA 9552mRCA stented w/ Taxus DES;   C.  2009 Neg MV;   D. 11/2010 Echo - EF 65%;  E. 08/2011 - nstemi, [facility] and EF 37%  . Autoimmune liver disease     A.  Followed @ [facility] - on chronic prednisone and  sirolimus  . Hyperlipidemia     inability to stake statins due to liver disease  . Cellulitis     chronic cellulitis of LE  . Chronic kidney disease   . Bulging disc   . Sciatica   . Chronic back pain     A.  On Methadone  . Skin cancer (melanoma)   . Esophageal stricture     narrowing  . Irregular heart beat   . Myocardial infarction 2007 and  Jan. 2012  . Diabetic neuropathy   . Autoimmune liver disease   . Sepsis     Past Surgical History  Procedure Laterality Date  . Cardiac catheterization      Normal EF  . Coronary angioplasty  2007    2 DES placement to RCA complicated by a wire perforation  . Back surgery  1992  .  Tubal ligation  1982  . Elbow surgery  1994    neuroma removed left  . Abscess drainage      Right side  . Spine surgery    . Lamenectomy  1989  . Heart stents  2007  and  2012    X's 1  and  2012 X's 2 stents  . Cardioversion N/A 10/31/2012    Procedure: CARDIOVERSION;  Surgeon: Larey Dresser, MD;  Location: [facility] ENDOSCOPY;  Service: Cardiovascular;  Laterality: N/A;  . Tee without cardioversion N/A 06/22/2014    Procedure: TRANSESOPHAGEAL ECHOCARDIOGRAM (TEE);  Surgeon: Larey Dresser, MD;  Location: [facility] ENDOSCOPY;  Service: Cardiovascular;  Laterality: N/A;  . Left heart catheterization with coronary angiogram N/A 08/29/2011    Procedure: LEFT HEART CATHETERIZATION WITH CORONARY ANGIOGRAM;  Surgeon: Wellington Hampshire, MD;  Location: [facility] CATH LAB;  Service: Cardiovascular;  Laterality: N/A;  . Percutaneous coronary stent intervention (pci-s)   08/29/2011    Procedure: PERCUTANEOUS CORONARY STENT INTERVENTION (PCI-S);  Surgeon: Wellington Hampshire, MD;  Location: [facility] CATH LAB;  Service: Cardiovascular;;     Allergies  Allergies  Allergen Reactions  . Cyclosporine     Blasted bone marrow  . Azathioprine     Other reaction(s): Unknown  . Mycophenolate Other (See Comments)    Pre-op note mentions "intolerance" to mycophenolate.  Unknown reaction, unknown severity.  . Other     Other reaction(s): Other (See Comments) Uncoded Allergy. Allergen: Other Allergy: See Patient Chart for Details, Other Reaction: Toxicity     Home Medications  Prior to Admission medications   Medication Sig Start Date End Date Taking? Authorizing Provider  ACCU-CHEK AVIVA PLUS test strip USE TO CHECK BLOOD SUGAR AS DIRECTED 3 TIMES A DAY 10/18/14   Elayne Snare, MD  apixaban (ELIQUIS) 2.5 MG TABS tablet Take 1 tablet (2.5 mg total) by mouth 2 (two) times daily. 09/13/14   Imogene Burn, PA-C  Ascorbic Acid (VITAMIN C) 1000 MG tablet Take 1,000 mg by mouth 2 (two) times daily.     Historical Provider, MD  bisoprolol (ZEBETA) 5 MG tablet Take 1 tablet (5 mg total) by mouth daily. 10/11/14   Larey Dresser, MD  Blood Glucose Monitoring Suppl (ACCU-CHEK AVIVA PLUS) W/DEVICE KIT Use to check blood sugar 3 times per day dx code 250.00 03/10/14   Elayne Snare, MD  calcium carbonate (OS-CAL) 600 MG TABS tablet Take 600 mg by mouth 2 (two) times daily with a meal.    Historical Provider, MD  Cholecalciferol (VITAMIN D PO) Take 2,000 Units by mouth at bedtime.     Historical Provider, MD  Chromium 500 MCG TABS Take 1 tablet by mouth daily.      Historical Provider, MD  ezetimibe (ZETIA) 10 MG tablet Take 10 mg by mouth daily.      Historical Provider, MD  fish oil-omega-3 fatty acids 1000 MG capsule Take 1 g by mouth 2 (two) times daily.     Historical Provider, MD  furosemide (LASIX) 40 MG tablet 1 tablet (35m) by mouth in the AM and 1/2 tablet (233m by mouth in the PM  10/11/14   DaLarey DresserMD  gabapentin (NEURONTIN) 100 MG capsule Take 1 capsule (100 mg total) by mouth 3 (three) times daily. 07/14/14   AjElayne SnareMD  insulin aspart (NOVOLOG) 100 UNIT/ML injection Inject 8-15 Units into the skin 3 (three) times daily with meals. Rely R  03-13-14    Historical Provider, MD  losartan (  COZAAR) 25 MG tablet TAKE 1 TABLET BY MOUTH EVERY DAY. 10/11/14   Larey Dresser, MD  MAGNESIUM PO Take 125 mg by mouth daily.    Historical Provider, MD  methadone (DOLOPHINE) 10 MG tablet Take 1 tablet (10 mg total) by mouth 2 (two) times daily. 06/24/14   Norman Herrlich, MD  Multiple Vitamin (MULTIVITAMIN) capsule Take 1 capsule by mouth daily.      Historical Provider, MD  nitroGLYCERIN (NITROSTAT) 0.4 MG SL tablet Place 1 tablet (0.4 mg total) under the tongue every 5 (five) minutes as needed for chest pain. 09/01/11   Rogelia Mire, NP  omeprazole (PRILOSEC) 20 MG capsule Take 20 mg by mouth daily.  06/06/14   Historical Provider, MD  Jonetta Speak LANCETS FINE MISC Use to check blood sugar 3 times per day dx code 250.00 03/03/14   Elayne Snare, MD  oxyCODONE (OXY IR/ROXICODONE) 5 MG immediate release tablet Take 1 tablet by mouth as directed. 09/16/14   Historical Provider, MD  pravastatin (PRAVACHOL) 80 MG tablet Take 1 tablet (80 mg total) by mouth every evening. 10/11/14   Larey Dresser, MD  predniSONE (DELTASONE) 10 MG tablet Take 10 mg by mouth daily.      Historical Provider, MD  Probiotic Product (PROBIOTIC DAILY PO) Take 1 tablet by mouth daily.     Historical Provider, MD  Selenium 200 MCG CAPS Take 1 capsule by mouth daily.      Historical Provider, MD  sirolimus (RAPAMUNE) 1 MG tablet Take 1 mg by mouth every other day.      Historical Provider, MD    Family History  Family History  Problem Relation Age of Onset  . Lung cancer Mother 67  . Heart attack Father 73  . Heart disease Father   . Congestive Heart Failure Father   . Heart disease Maternal  Grandfather   . Hypertension Brother   . Hypertension Son   . Pancreatic cancer Mother    Family Status  Relation Status Death Age  . Mother Deceased   . Father Deceased 13    CHF  . Maternal Grandfather Deceased   . Sister Alive   . Brother Alive      Social History  History   Social History  . Marital Status: Married    Spouse Name: Cynthia Hicks  . Number of Children: 2  . Years of Education: N/A   Occupational History  . real Sport and exercise psychologist     retired   Social History Main Topics  . Smoking status: Never Smoker   . Smokeless tobacco: Never Used  . Alcohol Use: No  . Drug Use: No  . Sexual Activity: Not on file   Other Topics Concern  . Not on file   Social History Narrative   Pt lives in [place] with husband.  She is retired.  She is not very active @ home.     All other systems reviewed and are otherwise negative except as noted above.  Physical Exam  Blood pressure 119/56, pulse 86, temperature 98.3 F (36.8 C), temperature source Oral, height 5' 4"  (1.626 m), SpO2 94 %.  General: Pleasant, NAD Psych: Normal affect. Neuro: Alert and oriented X 3. Moves all extremities spontaneously. HEENT: Normal  Neck: Supple without bruits or JVD. Lungs:  Resp regular and unlabored, CTA. Heart: irreg irreg. no s3, s4, or murmurs. Abdomen: Soft, non-tender, non-distended, BS + x 4.  Extremities: No clubbing, cyanosis or edema. DP/PT/Radials 2+ and equal  bilaterally.  Labs  No results for input(s): CKTOTAL, CKMB, TROPONINI in the last 72 hours. Lab Results  Component Value Date   WBC 11.6* 10/11/2014   HGB 11.9* 10/11/2014   HCT 37.0 10/11/2014   MCV 89.0 10/11/2014   PLT 190.0 10/11/2014    Recent Labs Lab 10/19/14 1032  NA 141  K 4.0  CL 99  CO2 37*  BUN 41*  CREATININE 1.82*  CALCIUM 9.6  GLUCOSE 105*   Lab Results  Component Value Date   CHOL 170 10/11/2014   HDL 73.00 10/11/2014   LDLCALC 192 08/04/2009   TRIG 206.0* 10/11/2014   No results  found for: DDIMER   Radiology/Studies  No results found.  ECG  afib HR 85 bpm.  QTc 423 msec.   ASSESSMENT AND PLAN  BERKLEE BATTEY is a 81 y.o. female with a history of AFib on eliquis, CAD, chronic diastolic CHF and autoimmune hepatitis who was admitted to [facility] today for Tikosyn loading.   Atrial fibrillation -- Continue Eliquis 2.87m BID -- Will initiate Tiksosyn at 125 mcg BID tonight. Her creat clearance is 21.6 currently. However, her creat is a little elevated today at 1.8, which is up from her baseline. If her creat clearance falls below 20, Tikosyn will be contraindicatied.  -- K 4.0 and Mag 2.5 today -- ECG today w/trial fibrillation with vent rate of 85 bpm. QTc 423 msec.  -- Patient also on methadone which can prolong QTc. Monitor carefully.   Chronic diastolic CHF- no s/s volume overload. Continue home lasix.   CKD- creat a little elevated from baseline today at 1.8 (1.6 10/11/14). This may improve if we can restore NSR with Tikosyn. Continue to monitor  DM- will placed on SSI  HTN- continue home meds   HLD- continue zetia and statin    Signed, TEileen Stanford PA-C 10/19/2014, 4:31 PM  Pager [phone]

## 2014-10-19 NOTE — Telephone Encounter (Signed)
I will forward to Elberta Leatherwood Pharm-D to follow.

## 2014-10-19 NOTE — Progress Notes (Signed)
Patient ID: Cynthia Hicks, female   DOB: Nov 14, 1932, 81 y.o.   MRN: [id number] PCP: Dr. Celedonio Miyamoto  Cynthia Hicks is a 81 yo F pt of Dr. Claris Gladden who was seen for Tikosyn initiation.  She was last seen by Dr. Aundra Dubin on 10/11/14.  She has a PMH significant for Afib (s/p DCCV in 3/14), CAD, CHF, and autoimmune hepatitis.  At her visit in March with Dr. Aundra Dubin, she was noted to be in atrial fibrillation.  Pt does not feel any palpitations when in afib but she does seem to be more symptomatic with CHF when in afib.  Plan was to attempt restoring NSR at that time.  Dr. Aundra Dubin discussed options with patient and given her other comorbidities, plan was to start Tikosyn.    Pt reported having incident where she went to ER at [facility] yesterday.  She was at her eye doctor when she had an episode of diaphoresis and increase in SOB. She was concerned it was related to her heart.   She states they did an EKG, chest x-ray, labs and urinalysis and everything was normal.  Her blood sugar was 103 so she believes it may have been an episode of hypoglycemia.  She states she feels fine today.    Reviewed pt's chart.  She is currently not taking any contraindicated medications.  She is taking methadone, which can prolong QTc.  This has been a chronic medication for her and QTc is stable today.  She has never taken any other antiarrhythmics.  She is adequately anticoagulated with Eliquis 2.15m BID (dose reduced for weight and SCr) and reports compliance over the last 30 days.   Spoke with insurance company regarding cost.  Tikosyn is covered at a Tier 3 with no PA required.  Her copay will be approximately $43 per month.   EKG reviewed by Dr. TLovena Le Atrial fibrillation with vent rate of 85 bpm.  QTc 423 msec.   Current Outpatient Prescriptions  Medication Sig Dispense Refill  . ACCU-CHEK AVIVA PLUS test strip USE TO CHECK BLOOD SUGAR AS DIRECTED 3 TIMES A DAY 100 each 0  . apixaban (ELIQUIS) 2.5 MG TABS tablet  Take 1 tablet (2.5 mg total) by mouth 2 (two) times daily.    . Ascorbic Acid (VITAMIN C) 1000 MG tablet Take 1,000 mg by mouth 2 (two) times daily.     . bisoprolol (ZEBETA) 5 MG tablet Take 1 tablet (5 mg total) by mouth daily. 90 tablet 3  . Blood Glucose Monitoring Suppl (ACCU-CHEK AVIVA PLUS) W/DEVICE KIT Use to check blood sugar 3 times per day dx code 250.00 1 kit 0  . calcium carbonate (OS-CAL) 600 MG TABS tablet Take 600 mg by mouth 2 (two) times daily with a meal.    . Cholecalciferol (VITAMIN D PO) Take 2,000 Units by mouth at bedtime.     . Chromium 500 MCG TABS Take 1 tablet by mouth daily.      .Marland Kitchenezetimibe (ZETIA) 10 MG tablet Take 10 mg by mouth daily.      . fish oil-omega-3 fatty acids 1000 MG capsule Take 1 g by mouth 2 (two) times daily.     . furosemide (LASIX) 40 MG tablet 1 tablet (459m by mouth in the AM and 1/2 tablet (2018mby mouth in the PM 135 tablet 3  . gabapentin (NEURONTIN) 100 MG capsule Take 1 capsule (100 mg total) by mouth 3 (three) times daily. 270 capsule 1  . insulin  aspart (NOVOLOG) 100 UNIT/ML injection Inject 8-15 Units into the skin 3 (three) times daily with meals. Rely R  03-13-14    . losartan (COZAAR) 25 MG tablet TAKE 1 TABLET BY MOUTH EVERY DAY. 90 tablet 3  . MAGNESIUM PO Take 125 mg by mouth daily.    . methadone (DOLOPHINE) 10 MG tablet Take 1 tablet (10 mg total) by mouth 2 (two) times daily. 30 tablet 0  . Multiple Vitamin (MULTIVITAMIN) capsule Take 1 capsule by mouth daily.      . nitroGLYCERIN (NITROSTAT) 0.4 MG SL tablet Place 1 tablet (0.4 mg total) under the tongue every 5 (five) minutes as needed for chest pain. 25 tablet 3  . omeprazole (PRILOSEC) 20 MG capsule Take 20 mg by mouth daily.     Glory Rosebush DELICA LANCETS FINE MISC Use to check blood sugar 3 times per day dx code 250.00 100 each 3  . oxyCODONE (OXY IR/ROXICODONE) 5 MG immediate release tablet Take 1 tablet by mouth as directed.  0  . pravastatin (PRAVACHOL) 80 MG tablet  Take 1 tablet (80 mg total) by mouth every evening. 90 tablet 3  . predniSONE (DELTASONE) 10 MG tablet Take 10 mg by mouth daily.      . Probiotic Product (PROBIOTIC DAILY PO) Take 1 tablet by mouth daily.     . Selenium 200 MCG CAPS Take 1 capsule by mouth daily.      . sirolimus (RAPAMUNE) 1 MG tablet Take 1 mg by mouth every other day.       No current facility-administered medications for this visit.

## 2014-10-19 NOTE — Progress Notes (Signed)
Pharmacy Consult for Dofetilide (Tikosyn) Initiation  Admit Complaint: 81 y.o. female admitted 10/19/2014 with atrial fibrillation to be initiated on dofetilide.   Assessment:  Patient Exclusion Criteria: If any screening criteria checked as "Yes", then  patient  should NOT receive dofetilide until criteria item is corrected. If "Yes" please indicate correction plan.  YES  NO Patient  Exclusion Criteria Correction Plan  []  [x]  Baseline QTc interval is greater than or equal to 440 msec. IF above YES box checked dofetilide contraindicated unless patient has ICD; then may proceed if QTc 500-550 msec or with known ventricular conduction abnormalities may proceed with QTc 550-600 msec. QTc =  423 on EKG from [facility] 10/19/14   []  [x]  Magnesium level is less than 1.8 mEq/l : Last magnesium:  Lab Results  Component Value Date   MG 2.5 10/19/2014         []  [x]  Potassium level is less than 4 mEq/l : Last potassium:  Lab Results  Component Value Date   K 4.0 10/19/2014         []  [x]  Patient is known or suspected to have a digoxin level greater than 2 ng/ml: No results found for: DIGOXIN  Patient is not taking digoxin    []  [x]  Creatinine clearance less than 20 ml/min (calculated using Cockcroft-Gault, actual body weight and serum creatinine): Estimated Creatinine Clearance: 21.6 mL/min (by C-G formula based on Cr of 1.82).  *Borderline- discussed with PA. Start low. Baseline SCr 1.2 or 1.3.  [x]  []  Patient has received drugs known to prolong the QT intervals within the last 48 hours(phenothiazines, tricyclics or tetracyclic antidepressants, erythromycin, H-1 antihistamines, cisapride, fluoroquinolones, azithromycin). Drugs not listed above may have an, as yet, undetected potential to prolong the QT interval, updated information on QT prolonging agents is available at this website:QT prolonging agents  *Patient was possibly on methadone 10mg  BID prior to admission which can prolong  QTc and would require monitoring. *Discussed history of methadone with PA - recommend monitoring.   []  [x]  Patient received a dose of hydrochlorothiazide (Oretic) alone or in any combination including triamterene (Dyazide, Maxzide) in the last 48 hours.   []  [x]  Patient received a medication known to increase dofetilide plasma concentrations prior to initial dofetilide dose:  . Trimethoprim (Primsol, Proloprim) in the last 36 hours . Verapamil (Calan, Verelan) in the last 36 hours or a sustained release dose in the last 72 hours . Megestrol (Megace) in the last 5 days  . Cimetidine (Tagamet) in the last 6 hours . Ketoconazole (Nizoral) in the last 24 hours . Itraconazole (Sporanox) in the last 48 hours  . Prochlorperazine (Compazine) in the last 36 hours    []  [x]  Patient is known to have a history of torsades de pointes; congenital or acquired long QT syndromes.   []  [x]  Patient has received a Class 1 antiarrhythmic with less than 2 half-lives since last dose. (Disopyramide, Quinidine, Procainamide, Lidocaine, Mexiletine, Flecainide, Propafenone)   []  [x]  Patient has received amiodarone therapy in the past 3 months or amiodarone level is greater than 0.3 ng/ml.    Patient has been appropriately anticoagulated with apixaban.  Ordering provider was confirmed at [email or web address] if they are not listed on the [place] Prescribers list.  Goal of Therapy: Follow renal function, electrolytes, potential drug interactions, and dose adjustment. Provide education and 1 week supply at discharge.  Plan:  [x]   Physician selected initial dose within range recommended for patients level of renal function - will monitor  for response. **Discussed with Angelena Form, PA Cardiology as patient with borderline CrCl of 21.6 mL/min.    []   Physician selected initial dose outside of range recommended for patients level of renal function - will discuss if the dose should be altered at this time.    [facility] One Calculated CrCl  Dose q12h  []  > 60 ml/min 500 mcg  []  40-60 ml/min 250 mcg  [x]  20-40 ml/min 125 mcg   2. Follow up QTc after the first 5 doses, renal function, electrolytes (K & Mg) daily x 3     days, dose adjustment, success of initiation and facilitate 1 week discharge supply as clinically indicated.  3. Initiate Tikosyn education video (Call [phone] and ask for video # 116).  4. Place Enrollment Form on the chart for discharge supply of dofetilide.   Brain Hilts 3:47 PM 10/19/2014

## 2014-10-20 ENCOUNTER — Other Ambulatory Visit: Payer: Self-pay | Admitting: Interventional Cardiology

## 2014-10-20 DIAGNOSIS — I481 Persistent atrial fibrillation: Principal | ICD-10-CM

## 2014-10-20 LAB — BASIC METABOLIC PANEL
ANION GAP: 9 (ref 5–15)
BUN: 41 mg/dL — ABNORMAL HIGH (ref 6–23)
CHLORIDE: 100 mmol/L (ref 96–112)
CO2: 31 mmol/L (ref 19–32)
CREATININE: 1.77 mg/dL — AB (ref 0.50–1.10)
Calcium: 9 mg/dL (ref 8.4–10.5)
GFR calc Af Amer: 30 mL/min — ABNORMAL LOW (ref >90)
GFR calc non Af Amer: 26 mL/min — ABNORMAL LOW (ref >90)
GLUCOSE: 111 mg/dL — AB (ref 70–99)
Potassium: 3.9 mmol/L (ref 3.5–5.1)
Sodium: 140 mmol/L (ref 135–145)

## 2014-10-20 LAB — GLUCOSE, CAPILLARY
GLUCOSE-CAPILLARY: 103 mg/dL — AB (ref 70–99)
GLUCOSE-CAPILLARY: 261 mg/dL — AB (ref 70–99)
Glucose-Capillary: 150 mg/dL — ABNORMAL HIGH (ref 70–99)
Glucose-Capillary: 175 mg/dL — ABNORMAL HIGH (ref 70–99)
Glucose-Capillary: 212 mg/dL — ABNORMAL HIGH (ref 70–99)

## 2014-10-20 LAB — MAGNESIUM: Magnesium: 2.3 mg/dL (ref 1.5–2.5)

## 2014-10-20 MED ORDER — POTASSIUM CHLORIDE CRYS ER 20 MEQ PO TBCR
40.0000 meq | EXTENDED_RELEASE_TABLET | Freq: Once | ORAL | Status: AC
  Administered 2014-10-20: 40 meq via ORAL
  Filled 2014-10-20: qty 2

## 2014-10-20 NOTE — Progress Notes (Signed)
Angelena Form PA notified of patient's conversion to SR and verbalized understanding. afleming RN

## 2014-10-20 NOTE — Progress Notes (Signed)
Patient ID: Cynthia Hicks, female   DOB: 30-Sep-1932, 81 y.o.   MRN: [id number]   SUBJECTIVE: No complaints.  Hard time sleeping in bed due to neck pain.   Scheduled Meds: . apixaban  2.5 mg Oral BID  . bisoprolol  5 mg Oral Daily  . calcium carbonate  1 tablet Oral BID WC  . dofetilide  125 mcg Oral BID  . ezetimibe  10 mg Oral Daily  . furosemide  40 mg Oral Daily  . gabapentin  100 mg Oral TID  . insulin aspart  0-20 Units Subcutaneous TID WC  . magnesium oxide  200 mg Oral Daily  . methadone  10 mg Oral BID  . omega-3 acid ethyl esters  1 g Oral BID  . pantoprazole  40 mg Oral Daily  . potassium chloride  40 mEq Oral Once  . pravastatin  80 mg Oral QPM  . predniSONE  10 mg Oral Q breakfast  . sirolimus  1 mg Oral QODAY  . sodium chloride  3 mL Intravenous Q12H   Continuous Infusions:  PRN Meds:.sodium chloride, nitroGLYCERIN, sodium chloride    Filed Vitals:   10/19/14 1539 10/19/14 1950 10/19/14 2103 10/20/14 0406  BP: 119/56  125/52 110/68  Pulse: 86  81 75  Temp: 98.3 F (36.8 C)  97.7 F (36.5 C) 97.8 F (36.6 C)  TempSrc: Oral  Oral Oral  Resp:   18 18  Height: 5\' 4"  (1.626 m) 5\' 4"  (1.626 m)    SpO2: 94%  96% 98%    Intake/Output Summary (Last 24 hours) at 10/20/14 0722 Last data filed at 10/19/14 1800  Gross per 24 hour  Intake    240 ml  Output      0 ml  Net    240 ml    LABS: Basic Metabolic Panel:  Recent Labs  10/19/14 1903 10/20/14 0500  NA 137 140  K 6.0* 3.9  CL 98 100  CO2 32 31  GLUCOSE 278* 111*  BUN 45* 41*  CREATININE 1.98* 1.77*  CALCIUM 9.0 9.0  MG 2.4 2.3   RADIOLOGY: No results found.  PHYSICAL EXAM General: NAD Neck: JVP 8-9 cm, no thyromegaly or thyroid nodule.  Lungs: Clear to auscultation bilaterally with normal respiratory effort. CV: Nondisplaced PMI.  Heart irregular S1/S2, no S3/S4, no murmur.  No peripheral edema.  No carotid bruit.  Normal pedal pulses.  Abdomen: Soft, nontender, no hepatosplenomegaly, no  distention.  Neurologic: Alert and oriented x 3.  Psych: Normal affect. Extremities: No clubbing or cyanosis.   TELEMETRY: Reviewed telemetry pt in atrial fibrillation in 80s  ASSESSMENT AND PLAN: 81 yo with CKD, CAD, chronic diastolic CHF, PAD, and persistent atrial fibrillation.  1. Atrial fibrillation: Persistent and symptomatic => more dyspnea when in atrial fibrillation.  Rate is controlled.  So far doing ok on reduced-dose Tikosyn.  - Continue Tikosyn 125 mcg bid and Eliquis. - If she does not come out of atrial fibrillation on her own, plan DCCV on Friday am.  - K/Mg repletion.  2. Chronic diastolic CHF: She does have some volume overload on exam.  Keep Lasix at 40 mg daily with Tikosyn initiation today, possibly back to 40 qam/20 qpm tomorrow.  3. CKD: Creatinine lower this morning.  I have stopped losartan.  4. CAD: Stable, no chest pain.  On statin.   [name] 10/20/2014 7:27 AM

## 2014-10-20 NOTE — Progress Notes (Signed)
Patient's OTc 0.48, patient is NSR. Afleming, RN

## 2014-10-20 NOTE — Care Management Note (Signed)
    Page 1 of 2   10/22/2014     3:08:32 PM CARE MANAGEMENT NOTE 10/22/2014  Patient:  Cynthia Hicks, Cynthia Hicks   Account Number:  [id number]  Date Initiated:  10/20/2014  Documentation initiated by:  Marvetta Gibbons  Subjective/Objective Assessment:   Pt admitted with afib for Tikosyn load     Action/Plan:   PTA pt lived at home- plan to return home-   Anticipated DC Date:  10/23/2014   Anticipated DC Plan:  [place]  CM consult  Medication Assistance  Outpatient Services - Pt will follow up      [facility] Choice  [place]   Choice offered to / List presented to:  C-1 Patient   DME arranged  3-N-1  Vassie Moselle      DME agency  [place].        Status of service:  Completed, signed off Medicare Important Message given?  YES (If response is "NO", the following Medicare IM given date fields will be blank) Date Medicare IM given:  10/22/2014 Medicare IM given by:  Marvetta Gibbons Date Additional Medicare IM given:   Additional Medicare IM given by:    Discharge Disposition:  HOME/SELF CARE  Per UR Regulation:  Reviewed for med. necessity/level of care/duration of stay  If discussed at [place] of Stay Meetings, dates discussed:    Comments:  10/22/14- 1200- Marvetta Gibbons RN, BSN [phone] Pt for d/c today, 7 day Tikosyn script sent to main pharmacy to fill- will call bedside RN when ready, spoke with pt who would like RW and 3n1 for home- call made to Rosaria Ferries PA- orders received for DME- spoke with [facility] with [facility] - equipment to be delivered to room prior to discharge- pt also was doing outpt PT- resumption note has been placed on pt's discharge AVS- pt aware and will take to outpt PT next week.  10/20/14- 1400- Marvetta Gibbons RN, BSN [phone] Referral for Tikosyn- spoke with pt regarding Tikosyn- per pt MD office checking on pt assistance option for pt- informed pt CM will check benefit coverage for pt - pt  informs CM that drug coverage is with [employer] (ID # [id number]) pt uses [facility] in [place]- call made to pharmacy who does participate in [place] program and is able to order drug once prescription received.  per benefits check- PT COPAY WILL BE  $95.73- PRIOR AUTH IS NOT REQUIRED--- pt informed of copay cost- will need 7 day supply from [place] (will need script to be sent down prior to discharge to fill)- pt understands that she will need to take script to her pharmacy so that they can order drug to have in stock.

## 2014-10-20 NOTE — Progress Notes (Signed)
Utilization review completed.  

## 2014-10-20 NOTE — Progress Notes (Signed)
Patient's OTc 0.48 afleming, RN

## 2014-10-21 ENCOUNTER — Ambulatory Visit: Payer: Medicare Other | Admitting: Cardiology

## 2014-10-21 LAB — CBC
HCT: 35.6 % — ABNORMAL LOW (ref 36.0–46.0)
Hemoglobin: 11.1 g/dL — ABNORMAL LOW (ref 12.0–15.0)
MCH: 29.4 pg (ref 26.0–34.0)
MCHC: 31.2 g/dL (ref 30.0–36.0)
MCV: 94.2 fL (ref 78.0–100.0)
PLATELETS: 168 10*3/uL (ref 150–400)
RBC: 3.78 MIL/uL — ABNORMAL LOW (ref 3.87–5.11)
RDW: 21 % — ABNORMAL HIGH (ref 11.5–15.5)
WBC: 7.7 10*3/uL (ref 4.0–10.5)

## 2014-10-21 LAB — GLUCOSE, CAPILLARY
GLUCOSE-CAPILLARY: 115 mg/dL — AB (ref 70–99)
Glucose-Capillary: 203 mg/dL — ABNORMAL HIGH (ref 70–99)
Glucose-Capillary: 190 mg/dL — ABNORMAL HIGH (ref 70–99)
Glucose-Capillary: 164 mg/dL — ABNORMAL HIGH (ref 70–99)

## 2014-10-21 LAB — BASIC METABOLIC PANEL
Anion gap: 9 (ref 5–15)
BUN: 42 mg/dL — AB (ref 6–23)
CALCIUM: 9.3 mg/dL (ref 8.4–10.5)
CO2: 31 mmol/L (ref 19–32)
CREATININE: 1.88 mg/dL — AB (ref 0.50–1.10)
Chloride: 102 mmol/L (ref 96–112)
GFR calc non Af Amer: 24 mL/min — ABNORMAL LOW (ref >90)
GFR, EST AFRICAN AMERICAN: 28 mL/min — AB (ref >90)
Glucose, Bld: 121 mg/dL — ABNORMAL HIGH (ref 70–99)
Potassium: 4.5 mmol/L (ref 3.5–5.1)
Sodium: 142 mmol/L (ref 135–145)

## 2014-10-21 LAB — MAGNESIUM: MAGNESIUM: 2.1 mg/dL (ref 1.5–2.5)

## 2014-10-21 MED ORDER — FUROSEMIDE 20 MG PO TABS
20.0000 mg | ORAL_TABLET | Freq: Every evening | ORAL | Status: DC
Start: 2014-10-21 — End: 2014-10-22
  Administered 2014-10-21: 20 mg via ORAL
  Filled 2014-10-21 (×3): qty 1

## 2014-10-21 MED ORDER — OXYCODONE HCL 5 MG PO TABS
5.0000 mg | ORAL_TABLET | Freq: Four times a day (QID) | ORAL | Status: DC | PRN
  Administered 2014-10-21 – 2014-10-22 (×2): 5 mg via ORAL
  Filled 2014-10-21 (×2): qty 1

## 2014-10-21 MED ORDER — HEPARIN SODIUM (PORCINE) 5000 UNIT/ML IJ SOLN: 5000.0000 [IU] | Freq: Three times a day (TID) | INTRAMUSCULAR | Status: DC

## 2014-10-21 NOTE — Progress Notes (Signed)
Patient ID: Cynthia Hicks, female   DOB: [birth year], 81 y.o.   MRN: [id number]   SUBJECTIVE: No complaints. She went into NSR yesterday.  Complains of her chronic right shoulder pain.   Scheduled Meds: . apixaban  2.5 mg Oral BID  . bisoprolol  5 mg Oral Daily  . calcium carbonate  1 tablet Oral BID WC  . dofetilide  125 mcg Oral BID  . ezetimibe  10 mg Oral Daily  . furosemide  40 mg Oral Daily  . gabapentin  100 mg Oral TID  . insulin aspart  0-20 Units Subcutaneous TID WC  . magnesium oxide  200 mg Oral Daily  . methadone  10 mg Oral BID  . omega-3 acid ethyl esters  1 g Oral BID  . pantoprazole  40 mg Oral Daily  . pravastatin  80 mg Oral QPM  . predniSONE  10 mg Oral Q breakfast  . sirolimus  1 mg Oral QODAY  . sodium chloride  3 mL Intravenous Q12H   Continuous Infusions:  PRN Meds:.sodium chloride, nitroGLYCERIN, oxyCODONE, sodium chloride    Filed Vitals:   10/20/14 1345 10/20/14 2018 10/20/14 2108 10/21/14 0449  BP: 114/57 130/50 121/46 120/47  Pulse: 66  67 63  Temp: 97.7 F (36.5 C)  98 F (36.7 C) 98.4 F (36.9 C)  TempSrc: Oral  Oral Oral  Resp: 17  18 19   Height:      SpO2: 98%  96% 95%    Intake/Output Summary (Last 24 hours) at 10/21/14 0902 Last data filed at 10/20/14 1700  Gross per 24 hour  Intake    720 ml  Output      0 ml  Net    720 ml    LABS: Basic Metabolic Panel:  Recent Labs  10/20/14 0500 10/21/14 0541  NA 140 142  K 3.9 4.5  CL 100 102  CO2 31 31  GLUCOSE 111* 121*  BUN 41* 42*  CREATININE 1.77* 1.88*  CALCIUM 9.0 9.3  MG 2.3 2.1   RADIOLOGY: No results found.  PHYSICAL EXAM General: NAD Neck: JVP 8-9 cm, no thyromegaly or thyroid nodule.  Lungs: Clear to auscultation bilaterally with normal respiratory effort. CV: Nondisplaced PMI.  Heart regular S1/S2, no S3/S4, no murmur.  No peripheral edema.  No carotid bruit.  Normal pedal pulses.  Abdomen: Soft, nontender, no hepatosplenomegaly, no distention.    Neurologic: Alert and oriented x 3.  Psych: Normal affect. Extremities: No clubbing or cyanosis.   TELEMETRY: Reviewed telemetry pt in NSR  ASSESSMENT AND PLAN: 81 yo with CKD, CAD, chronic diastolic CHF, PAD, and persistent atrial fibrillation.  1. Atrial fibrillation: Symptomatic => more dyspnea when in atrial fibrillation.  She is now in NSR. Last ECG showed stable QT interval. - Continue Tikosyn 125 mcg bid and Eliquis.   2. Chronic diastolic CHF: Lasix 40 JKD/32 qpm.    3. CKD: Creatinine stable this morning.  I have stopped losartan.  4. CAD: Stable, no chest pain.  On statin.   Home tomorrow.   [name] 10/21/2014 9:02 AM

## 2014-10-21 NOTE — Evaluation (Signed)
Physical Therapy Evaluation and Discharge Patient Details Name: Cynthia Hicks MRN: [id number] DOB: November 13, 1932 Today's Date: 10/21/2014   History of Present Illness  81 yo with CKD, CAD, chronic diastolic CHF, PAD, and persistent atrial fibrillation. Admitted for Tikosyn loading.  Clinical Impression  Patient evaluated by Physical Therapy with no further acute PT needs identified. All education has been completed and the patient has no further questions. Patient reports she has been making good progress at outpatient physical therapy. Today she ambulates generally well with a rolling walker although with a significantly flexed trunk (at baseline.) States she has a best friend that checks on her daily. Eager to return home and continue with outpatient physical therapy. States she needs an MD referral for insurance to allow her to continue with outpatient PT. See below for any follow-up Physial Therapy or equipment needs. PT is signing off. Thank you for this referral.     Follow Up Recommendations Outpatient PT (States she needs an MD referral to return to OP PT)    Equipment Recommendations  Rolling walker with 5" wheels;3in1 (PT)    Recommendations for Other Services       Precautions / Restrictions Precautions Precautions: Fall Restrictions Weight Bearing Restrictions: No      Mobility  Bed Mobility               General bed mobility comments: In chair at start of therapy  Transfers Overall transfer level: Modified independent                  Ambulation/Gait Ambulation/Gait assistance: Supervision Ambulation Distance (Feet): 175 Feet Assistive device: Rolling walker (2 wheeled) Gait Pattern/deviations: Step-through pattern;Decreased stride length;Trunk flexed;Drifts right/left Gait velocity: decreased   General Gait Details: Very flexed trunk. Frequent cues for upright posture with forward gaze. Mild drifting noted but demonstrates good control of RW  overall. No loss of balance requiring physical assist during ambulatory bout.   Stairs            Wheelchair Mobility    Modified Rankin (Stroke Patients Only)       Balance Overall balance assessment: Needs assistance Sitting-balance support: No upper extremity supported;Feet supported Sitting balance-Leahy Scale: Good     Standing balance support: No upper extremity supported;During functional activity Standing balance-Leahy Scale: Fair                               Pertinent Vitals/Pain Pain Assessment: 0-10 Pain Score: 3  Pain Location: Rt shoulder, Lt hip Pain Descriptors / Indicators: Aching;Shooting Pain Intervention(s): Limited activity within patient's tolerance;Monitored during session;Repositioned  HR elevated to 95 while ambulating today.    Home Living Family/patient expects to be discharged to:: Private residence Living Arrangements: Alone Available Help at Discharge: Friend(s) (Friend available as needed) Type of Home: House Home Access: Stairs to enter Entrance Stairs-Rails: Right Entrance Stairs-Number of Steps: 3 Home Layout: One level Home Equipment: [place] - 4 wheels;Cane - single point      Prior Function Level of Independence: Independent with assistive device(s)         Comments: Uses cane primarily to get to car.     Hand Dominance   Dominant Hand: Right    Extremity/Trunk Assessment   Upper Extremity Assessment: Defer to OT evaluation           Lower Extremity Assessment: RLE deficits/detail;LLE deficits/detail RLE Deficits / Details: RLE edematous and erythematous. LLE Deficits / Details:  LLE edematous, mild erythematous  Cervical / Trunk Assessment: Kyphotic  Communication   Communication: No difficulties  Cognition Arousal/Alertness: Awake/alert Behavior During Therapy: WFL for tasks assessed/performed Overall Cognitive Status: Within Functional Limits for tasks assessed                       General Comments      Exercises General Exercises - Lower Extremity Ankle Circles/Pumps: AROM;Both;5 reps;Seated Long Arc Quad: Strengthening;Both;5 reps;Seated Hip Flexion/Marching: Strengthening;Both;5 reps;Seated      Assessment/Plan    PT Assessment    PT Diagnosis Abnormality of gait;Acute pain   PT Problem List    PT Treatment Interventions     PT Goals (Current goals can be found in the Care Plan section) Acute Rehab PT Goals Patient Stated Goal: Go home and back to physical therapy clinic PT Goal Formulation: All assessment and education complete, DC therapy    Frequency     Barriers to discharge        Co-evaluation               End of Session Equipment Utilized During Treatment: Gait belt Activity Tolerance: Patient tolerated treatment well Patient left: in chair;with call bell/phone within reach Nurse Communication: Mobility status         Time: 2641-5830 PT Time Calculation (min) (ACUTE ONLY): 25 min   Charges:   PT Evaluation $Initial PT Evaluation Tier I: 1 Procedure PT Treatments $Gait Training: 8-22 mins   PT G CodesEllouise Newer 10/21/2014, 4:45 PM  [name], [place]

## 2014-10-21 NOTE — Progress Notes (Signed)
Patient remains in NSR with QTc 0.44. Tikosyn will be administered. Afleming, RN

## 2014-10-21 NOTE — Progress Notes (Signed)
Patient follow up EKG performed. Patient remains in NSR, QTc 0.44 following patient's 4th dose of Tikosyn. afleming RN

## 2014-10-22 LAB — BASIC METABOLIC PANEL
ANION GAP: 7 (ref 5–15)
BUN: 41 mg/dL — AB (ref 6–23)
CALCIUM: 9.4 mg/dL (ref 8.4–10.5)
CO2: 36 mmol/L — AB (ref 19–32)
CREATININE: 1.79 mg/dL — AB (ref 0.50–1.10)
Chloride: 102 mmol/L (ref 96–112)
GFR calc Af Amer: 30 mL/min — ABNORMAL LOW (ref >90)
GFR calc non Af Amer: 26 mL/min — ABNORMAL LOW (ref >90)
Glucose, Bld: 111 mg/dL — ABNORMAL HIGH (ref 70–99)
Potassium: 4.3 mmol/L (ref 3.5–5.1)
Sodium: 145 mmol/L (ref 135–145)

## 2014-10-22 LAB — GLUCOSE, CAPILLARY
Glucose-Capillary: 105 mg/dL — ABNORMAL HIGH (ref 70–99)
Glucose-Capillary: 210 mg/dL — ABNORMAL HIGH (ref 70–99)

## 2014-10-22 LAB — MAGNESIUM: MAGNESIUM: 1.9 mg/dL (ref 1.5–2.5)

## 2014-10-22 MED ORDER — DOFETILIDE 125 MCG PO CAPS: 125.0000 ug | ORAL_CAPSULE | Freq: Two times a day (BID) | ORAL | Status: DC

## 2014-10-22 MED ORDER — MAGNESIUM OXIDE 400 (241.3 MG) MG PO TABS
400.0000 mg | ORAL_TABLET | Freq: Every day | ORAL | Status: DC
  Administered 2014-10-22: 400 mg via ORAL
  Filled 2014-10-22: qty 1

## 2014-10-22 MED ORDER — MAGNESIUM OXIDE 400 (241.3 MG) MG PO TABS: 400.0000 mg | ORAL_TABLET | Freq: Every day | ORAL | Status: DC

## 2014-10-22 MED ORDER — POTASSIUM CHLORIDE CRYS ER 20 MEQ PO TBCR: 20.0000 meq | EXTENDED_RELEASE_TABLET | Freq: Every day | ORAL | Status: DC

## 2014-10-22 MED ORDER — POTASSIUM CHLORIDE CRYS ER 20 MEQ PO TBCR
20.0000 meq | EXTENDED_RELEASE_TABLET | Freq: Every day | ORAL | Status: DC
  Administered 2014-10-22: 20 meq via ORAL
  Filled 2014-10-22: qty 1

## 2014-10-22 NOTE — Progress Notes (Addendum)
PA paged for 7 day script for 7 day supply of tikosyn. Kathleen Argue S 10:13 AM

## 2014-10-22 NOTE — Progress Notes (Signed)
Discharge Note  Discontinued IV access Discontinued Telemetry monitor Pt informed about discharge instructions and discharge medications Assisted devices DME walker and bedside commode were provided for home use. Pt discharged to home with volunteer service on wheelchair All pts belongings sent with pt.  Marin Comment, Alexander Mcauley 2:00 PM 10/22/2014

## 2014-10-22 NOTE — Progress Notes (Signed)
Patient ID: Cynthia Hicks, female   DOB: Jan 20, 1933, 80 y.o.   MRN: [id number]   SUBJECTIVE: No complaints. Remains in NSR today.   Scheduled Meds: . apixaban  2.5 mg Oral BID  . bisoprolol  5 mg Oral Daily  . calcium carbonate  1 tablet Oral BID WC  . dofetilide  125 mcg Oral BID  . ezetimibe  10 mg Oral Daily  . furosemide  20 mg Oral QPM  . furosemide  40 mg Oral Daily  . gabapentin  100 mg Oral TID  . insulin aspart  0-20 Units Subcutaneous TID WC  . magnesium oxide  400 mg Oral Daily  . methadone  10 mg Oral BID  . omega-3 acid ethyl esters  1 g Oral BID  . pantoprazole  40 mg Oral Daily  . potassium chloride  20 mEq Oral Daily  . pravastatin  80 mg Oral QPM  . predniSONE  10 mg Oral Q breakfast  . sirolimus  1 mg Oral QODAY  . sodium chloride  3 mL Intravenous Q12H   Continuous Infusions:  PRN Meds:.sodium chloride, nitroGLYCERIN, oxyCODONE, sodium chloride    Filed Vitals:   10/21/14 1425 10/21/14 1745 10/21/14 2000 10/22/14 0500  BP: 110/55 117/46 145/76 114/56  Pulse: 63 72 88 70  Temp: 98 F (36.7 C)  98.6 F (37 C) 98 F (36.7 C)  TempSrc: Oral  Oral Oral  Resp: 20  19 21   Height:      SpO2: 93%  99% 95%    Intake/Output Summary (Last 24 hours) at 10/22/14 0746 Last data filed at 10/21/14 1600  Gross per 24 hour  Intake    600 ml  Output      0 ml  Net    600 ml    LABS: Basic Metabolic Panel:  Recent Labs  10/21/14 0541 10/22/14 0506  NA 142 145  K 4.5 4.3  CL 102 102  CO2 31 36*  GLUCOSE 121* 111*  BUN 42* 41*  CREATININE 1.88* 1.79*  CALCIUM 9.3 9.4  MG 2.1 1.9   RADIOLOGY: No results found.  PHYSICAL EXAM General: NAD Neck: JVP 7 cm, no thyromegaly or thyroid nodule.  Lungs: Clear to auscultation bilaterally with normal respiratory effort. CV: Nondisplaced PMI.  Heart regular S1/S2, no S3/S4, no murmur.  1+ ankle edema, R>L.  No carotid bruit.  Normal pedal pulses.  Abdomen: Soft, nontender, no hepatosplenomegaly, no  distention.  Neurologic: Alert and oriented x 3.  Psych: Normal affect. Extremities: No clubbing or cyanosis.   TELEMETRY: Reviewed telemetry pt in NSR  ASSESSMENT AND PLAN: 81 yo with CKD, CAD, chronic diastolic CHF, PAD, and persistent atrial fibrillation.  1. Atrial fibrillation: Symptomatic => more dyspnea when in atrial fibrillation.  She is now in NSR. Last ECG showed stable QT interval. - Continue Tikosyn 125 mcg bid and Eliquis.   2. Chronic diastolic CHF: Lasix 40 NKN/39 qpm.    3. CKD: Creatinine stable this morning.  I have stopped losartan.  4. CAD: Stable, no chest pain.  On statin.  5. Disposition: Home today, followup with me or PA in 2 weeks.  Home meds: Tikosyn 125 mcg bid, KCl 20 daily, magnesium oxide 400 mg daily, bisoprolol 5 mg daily, apixaban 2.5 mg bid, Lasix 40 qam/20 qpm, pravastatin 80 daily.  Stop losartan.   [name] 10/22/2014 7:46 AM

## 2014-10-22 NOTE — Progress Notes (Signed)
Medicare Important Message given? YES  (If response is "NO", the following Medicare IM given date fields will be blank)  Date Medicare IM given: 10/22/14 Medicare IM given by:  Sally Menard  

## 2014-10-22 NOTE — Discharge Instructions (Signed)
Okay to return to physical therapy next week.

## 2014-10-22 NOTE — Discharge Summary (Signed)
CARDIOLOGY DISCHARGE SUMMARY   Patient ID: Cynthia Hicks MRN: [id number] DOB/AGE: October 01, 1932 81 y.o.  Admit date: 10/19/2014 Discharge date: 10/22/2014  PCP: Bonnita Nasuti, MD Primary Cardiologist: Dr. Aundra Dubin  Primary Discharge Diagnosis:  Persistent atrial fibrillation Secondary Discharge Diagnosis:    Visit for monitoring Tikosyn therapy   Chronic anticoagulation with apixaban  Procedures: None  Hospital Course: Cynthia Hicks is a 81 y.o. female with a history of CKD, CAD, chronic diastolic CHF, PAD, and persistent atrial fibrillation. Her heart failure is more difficult to control when she is in atrial fibrillation. Tikosyn was recommended and she was admitted to the hospital on 10/19/2014 for Tikosyn loading.  She was started on Tikosyn at 125 g every 12 hours. She tolerated it well. Her ECG and labs were followed closely during her hospital stay. She spontaneously converted to sinus rhythm on 10/20/2014 and maintaining sinus rhythm during the rest of her stay.  On 10/22/2014, she had her sixth dose of Tikosyn. She was evaluated by Dr. Aundra Dubin and all data were reviewed. She was maintaining sinus rhythm and her electrolytes were stable. She will be on potassium supplementation and magnesium supplementation at discharge. Her losartan was discontinued. No further inpatient workup was indicated and she is considered stable for discharge, to follow-up as an outpatient.  Labs:   Lab Results  Component Value Date   WBC 7.7 10/21/2014   HGB 11.1* 10/21/2014   HCT 35.6* 10/21/2014   MCV 94.2 10/21/2014   PLT 168 10/21/2014     Recent Labs Lab 10/22/14 0506  NA 145  K 4.3  CL 102  CO2 36*  BUN 41*  CREATININE 1.79*  CALCIUM 9.4  GLUCOSE 111*   MAGNESIUM  Date Value Ref Range Status  10/22/2014 1.9 1.5 - 2.5 mg/dL Final   EKG: 10/21/2014 Sinus rhythm, sinus arrhythmia and PVCs Vent. rate 72 BPM PR interval 178 ms QRS duration 98 ms QT/QTc 430/470 ms P-R-T  axes 59 -59 156  FOLLOW UP PLANS AND APPOINTMENTS Allergies  Allergen Reactions  . Cyclosporine     Blasted bone marrow  . Azathioprine     Other reaction(s): Unknown  . Mycophenolate Other (See Comments)    Pre-op note mentions "intolerance" to mycophenolate.  Unknown reaction, unknown severity.  . Other     Other reaction(s): Other (See Comments) Uncoded Allergy. Allergen: Other Allergy: See Patient Chart for Details, Other Reaction: Toxicity  . Oysters [Shellfish Allergy] Nausea And Vomiting     Medication List    STOP taking these medications        losartan 25 MG tablet  Commonly known as:  COZAAR     MAGNESIUM PO      TAKE these medications        ACCU-CHEK AVIVA PLUS test strip  Generic drug:  glucose blood  USE TO CHECK BLOOD SUGAR AS DIRECTED 3 TIMES A DAY     ACCU-CHEK AVIVA PLUS W/DEVICE Kit  Use to check blood sugar 3 times per day dx code 250.00     apixaban 2.5 MG Tabs tablet  Commonly known as:  ELIQUIS  Take 1 tablet (2.5 mg total) by mouth 2 (two) times daily.     bisoprolol 5 MG tablet  Commonly known as:  ZEBETA  Take 1 tablet (5 mg total) by mouth daily.     calcium carbonate 600 MG Tabs tablet  Commonly known as:  OS-CAL  Take 600 mg by mouth 2 (two) times daily  with a meal.     Chromium 500 MCG Tabs  Take 1 tablet by mouth daily.     Coenzyme Q-10 100 MG capsule  Take 100 mg by mouth 2 (two) times daily.     dofetilide 125 MCG capsule  Commonly known as:  TIKOSYN  Take 1 capsule (125 mcg total) by mouth 2 (two) times daily.     ezetimibe 10 MG tablet  Commonly known as:  ZETIA  Take 10 mg by mouth daily.     fish oil-omega-3 fatty acids 1000 MG capsule  Take 1 g by mouth 2 (two) times daily.     furosemide 40 MG tablet  Commonly known as:  LASIX  1 tablet ($RemoveB'40mg'MEyIrKlZ$ ) by mouth in the AM and 1/2 tablet ($RemoveBef'20mg'wIvfjlcgSY$ ) by mouth in the PM     gabapentin 100 MG capsule  Commonly known as:  NEURONTIN  Take 1 capsule (100 mg total) by mouth 3  (three) times daily.     insulin aspart 100 UNIT/ML injection  Commonly known as:  novoLOG  Inject 8-15 Units into the skin 3 (three) times daily with meals. Rely R 8 units -8 units then 15 at bedtime.     magnesium oxide 400 (241.3 MG) MG tablet  Commonly known as:  MAG-OX  Take 1 tablet (400 mg total) by mouth daily.     methadone 10 MG tablet  Commonly known as:  DOLOPHINE  Take 1 tablet (10 mg total) by mouth 2 (two) times daily.     multivitamin capsule  Take 1 capsule by mouth daily.     nitroGLYCERIN 0.4 MG SL tablet  Commonly known as:  NITROSTAT  Place 1 tablet (0.4 mg total) under the tongue every 5 (five) minutes as needed for chest pain.     omeprazole 20 MG capsule  Commonly known as:  PRILOSEC  Take 20 mg by mouth daily.     ONETOUCH DELICA LANCETS FINE Misc  Use to check blood sugar 3 times per day dx code 250.00     oxyCODONE 5 MG immediate release tablet  Commonly known as:  Oxy IR/ROXICODONE  Take 5 mg by mouth 2 (two) times daily as needed for moderate pain.     potassium chloride SA 20 MEQ tablet  Commonly known as:  K-DUR,KLOR-CON  Take 1 tablet (20 mEq total) by mouth daily.     pravastatin 80 MG tablet  Commonly known as:  PRAVACHOL  Take 1 tablet (80 mg total) by mouth every evening.     predniSONE 10 MG tablet  Commonly known as:  DELTASONE  Take 10 mg by mouth daily.     PROBIOTIC DAILY PO  Take 1 tablet by mouth daily.     Selenium 200 MCG Caps  Take 1 capsule by mouth daily.     SYSTANE BALANCE OP  Place 1 drop into the left eye daily.     vitamin C 1000 MG tablet  Take 1,000 mg by mouth 2 (two) times daily.     VITAMIN D PO  Take 2,000 Units by mouth at bedtime.        Discharge Instructions    (HEART FAILURE PATIENTS) Call MD:  Anytime you have any of the following symptoms: 1) 3 pound weight gain in 24 hours or 5 pounds in 1 week 2) shortness of breath, with or without a dry hacking cough 3) swelling in the hands, feet or  stomach 4) if you have to sleep on extra pillows  at night in order to breathe.    Complete by:  As directed      Diet - low sodium heart healthy    Complete by:  As directed      Diet Carb Modified    Complete by:  As directed      Increase activity slowly    Complete by:  As directed             BRING ALL MEDICATIONS WITH YOU TO FOLLOW UP APPOINTMENTS  Time spent with patient to include physician time: 33 min Signed: Rosaria Ferries, PA-C 10/22/2014, 10:10 AM Co-Sign MD

## 2014-10-27 ENCOUNTER — Encounter: Payer: Self-pay | Admitting: Cardiology

## 2014-10-27 ENCOUNTER — Telehealth: Payer: Self-pay | Admitting: Cardiology

## 2014-10-27 NOTE — Telephone Encounter (Signed)
I left a message for the patient to call.  Labs scanned in today- dated 10/27/14 - BMP.

## 2014-10-27 NOTE — Telephone Encounter (Signed)
New message        Pt would like to know if you received her lab results that were faxed from Dr. Jannette Fogo office

## 2014-10-27 NOTE — Telephone Encounter (Signed)
I spoke with the patient and she is aware her BMP from today has been scanned in to her chart for Dr. Aundra Dubin to review. She was recently admitted from 3/15-3/18 for Tikosyn loading.

## 2014-10-27 NOTE — Telephone Encounter (Signed)
F/U ° ° ° ° ° ° ° ° ° °Pt returning call. Please call back.  °

## 2014-11-09 ENCOUNTER — Encounter: Payer: Self-pay | Admitting: Vascular Surgery

## 2014-11-10 ENCOUNTER — Other Ambulatory Visit: Payer: Self-pay | Admitting: Cardiology

## 2014-11-10 ENCOUNTER — Other Ambulatory Visit: Payer: Self-pay | Admitting: Vascular Surgery

## 2014-11-10 ENCOUNTER — Encounter: Payer: Self-pay | Admitting: Vascular Surgery

## 2014-11-10 ENCOUNTER — Ambulatory Visit (INDEPENDENT_AMBULATORY_CARE_PROVIDER_SITE_OTHER): Payer: Medicare Other | Admitting: Vascular Surgery

## 2014-11-10 ENCOUNTER — Ambulatory Visit (HOSPITAL_COMMUNITY)
Admission: RE | Admit: 2014-11-10 | Discharge: 2014-11-10 | Disposition: A | Discharge status: Home / Self Care | Payer: Medicare Other | Source: Ambulatory Visit | Attending: Vascular Surgery | Admitting: Vascular Surgery

## 2014-11-10 VITALS — BP 130/69 | HR 53 | Ht 64.0 in | Wt 127.0 lb

## 2014-11-10 DIAGNOSIS — M79604 Pain in right leg: Secondary | ICD-10-CM | POA: Diagnosis not present

## 2014-11-10 DIAGNOSIS — I83019 Varicose veins of right lower extremity with ulcer of unspecified site: Secondary | ICD-10-CM

## 2014-11-10 DIAGNOSIS — I83011 Varicose veins of right lower extremity with ulcer of thigh: Secondary | ICD-10-CM | POA: Diagnosis not present

## 2014-11-10 DIAGNOSIS — I872 Venous insufficiency (chronic) (peripheral): Secondary | ICD-10-CM | POA: Insufficient documentation

## 2014-11-10 DIAGNOSIS — I83012 Varicose veins of right lower extremity with ulcer of calf: Secondary | ICD-10-CM

## 2014-11-10 DIAGNOSIS — I83015 Varicose veins of right lower extremity with ulcer other part of foot: Secondary | ICD-10-CM

## 2014-11-10 DIAGNOSIS — I2510 Atherosclerotic heart disease of native coronary artery without angina pectoris: Secondary | ICD-10-CM

## 2014-11-10 DIAGNOSIS — I83013 Varicose veins of right lower extremity with ulcer of ankle: Secondary | ICD-10-CM | POA: Diagnosis not present

## 2014-11-10 DIAGNOSIS — L97919 Non-pressure chronic ulcer of unspecified part of right lower leg with unspecified severity: Secondary | ICD-10-CM

## 2014-11-10 DIAGNOSIS — I83014 Varicose veins of right lower extremity with ulcer of heel and midfoot: Secondary | ICD-10-CM

## 2014-11-10 DIAGNOSIS — I83018 Varicose veins of right lower extremity with ulcer other part of lower leg: Secondary | ICD-10-CM

## 2014-11-10 NOTE — Progress Notes (Signed)
[facility] [place]  Patient name: Cynthia Hicks MRN: [id number] DOB: July 23, 1933 Sex: female  REASON FOR VISIT: Follow up of chronic venous insufficiency.  HPI: Cynthia Hicks is a 81 y.o. female who I last saw on 10/22/2012. At that time, she had bilateral lower extremity swelling which was more significant on the right side. Venous duplex scan at that time showed no evidence of DVT in either lower extremity. There was no evidence of superficial thrombophlebitis. There was no reflux in the greater saphenous veins on either side. There was mild reflux in the deep venous systems bilaterally. However based on her exam I thought most likely her swelling was related to lymphedema.  She states that she's been hospitalized several times most recently with cellulitis of her right lower extremity. For this reason she was sent back for vascular evaluation.  She denies any significant claudication or rest pain. She just recently developed a wound on the lateral aspect of her right fifth toe.  She tries to elevate her legs but because of her back issues is really not able to do this correctly. In addition his of her limitations she is unable to wear compression stockings. She does use a venous pump on a regular basis however.  Past Medical History  Diagnosis Date  . Rapid heart rate 11/04/2010  . Hypertension   . Arthritis   . Diabetes mellitus   . Coronary artery disease 2007    A.  MI 01/2003;  B.  02/2006 NSTEMI - LAD 80-39m LCX nl, RCA 91m RCA stented w/ Taxus DES;   C.  2009 Neg MV;   D. 11/2010 Echo - EF 65%;  E. 08/2011 - nstemi, MV @ [facility] and EF 37%  . Autoimmune liver disease     A.  Followed @ [facility] - on chronic prednisone and  sirolimus  . Hyperlipidemia     inability to stake statins due to liver disease  . Cellulitis     chronic cellulitis of LE  . Chronic kidney disease   . Bulging disc   . Sciatica   . Chronic back pain     A.  On  Methadone  . Skin cancer (melanoma)   . Esophageal stricture     narrowing  . Irregular heart beat   . Myocardial infarction 2007 and  Jan. 2012  . Diabetic neuropathy   . Autoimmune liver disease   . Sepsis   . Visit for monitoring Tikosyn therapy 10/19/2014   Family History  Problem Relation Age of Onset  . Lung cancer Mother 4428. Heart attack Father 7938. Heart disease Father   . Congestive Heart Failure Father   . Heart disease Maternal Grandfather   . Hypertension Brother   . Hypertension Son   . Pancreatic cancer Mother    SOCIAL HISTORY: History  Substance Use Topics  . Smoking status: Never Smoker   . Smokeless tobacco: Never Used  . Alcohol Use: No   Allergies  Allergen Reactions  . Cyclosporine     Blasted bone marrow  . Azathioprine     Other reaction(s): Unknown  . Mycophenolate Other (See Comments)    Pre-op note mentions "intolerance" to mycophenolate.  Unknown reaction, unknown severity.  . Other     Other reaction(s): Other (See Comments) Uncoded Allergy. Allergen: Other Allergy: See Patient Chart for Details, Other Reaction: Toxicity  . Oysters [Shellfish Allergy] Nausea And Vomiting   Current  Outpatient Prescriptions  Medication Sig Dispense Refill  . ACCU-CHEK AVIVA PLUS test strip USE TO CHECK BLOOD SUGAR AS DIRECTED 3 TIMES A DAY 100 each 0  . apixaban (ELIQUIS) 2.5 MG TABS tablet Take 1 tablet (2.5 mg total) by mouth 2 (two) times daily.    . Ascorbic Acid (VITAMIN C) 1000 MG tablet Take 1,000 mg by mouth 2 (two) times daily.     . bisoprolol (ZEBETA) 5 MG tablet Take 1 tablet (5 mg total) by mouth daily. 90 tablet 3  . bisoprolol (ZEBETA) 5 MG tablet TAKE 1 TABLET BY MOUTH EVERY DAY 90 tablet 1  . Blood Glucose Monitoring Suppl (ACCU-CHEK AVIVA PLUS) W/DEVICE KIT Use to check blood sugar 3 times per day dx code 250.00 1 kit 0  . calcium carbonate (OS-CAL) 600 MG TABS tablet Take 600 mg by mouth 2 (two) times daily with a meal.    .  Cholecalciferol (VITAMIN D PO) Take 2,000 Units by mouth at bedtime.     . Chromium 500 MCG TABS Take 1 tablet by mouth daily.      . Coenzyme Q-10 100 MG capsule Take 100 mg by mouth 2 (two) times daily.    Marland Kitchen dofetilide (TIKOSYN) 125 MCG capsule Take 1 capsule (125 mcg total) by mouth 2 (two) times daily. 60 capsule 11  . ezetimibe (ZETIA) 10 MG tablet Take 10 mg by mouth daily.      . fish oil-omega-3 fatty acids 1000 MG capsule Take 1 g by mouth 2 (two) times daily.     . furosemide (LASIX) 40 MG tablet 1 tablet (80m) by mouth in the AM and 1/2 tablet (277m by mouth in the PM 135 tablet 3  . gabapentin (NEURONTIN) 100 MG capsule Take 1 capsule (100 mg total) by mouth 3 (three) times daily. 270 capsule 1  . magnesium oxide (MAG-OX) 400 (241.3 MG) MG tablet Take 1 tablet (400 mg total) by mouth daily. 30 tablet 11  . methadone (DOLOPHINE) 10 MG tablet Take 1 tablet (10 mg total) by mouth 2 (two) times daily. 30 tablet 0  . Multiple Vitamin (MULTIVITAMIN) capsule Take 1 capsule by mouth daily.      . nitroGLYCERIN (NITROSTAT) 0.4 MG SL tablet Place 1 tablet (0.4 mg total) under the tongue every 5 (five) minutes as needed for chest pain. 25 tablet 3  . omeprazole (PRILOSEC) 20 MG capsule Take 20 mg by mouth daily.     . Glory RosebushELICA LANCETS FINE MISC Use to check blood sugar 3 times per day dx code 250.00 100 each 3  . oxyCODONE (OXY IR/ROXICODONE) 5 MG immediate release tablet Take 5 mg by mouth 2 (two) times daily as needed for moderate pain.   0  . potassium chloride SA (K-DUR,KLOR-CON) 20 MEQ tablet Take 1 tablet (20 mEq total) by mouth daily. 30 tablet 11  . pravastatin (PRAVACHOL) 80 MG tablet Take 1 tablet (80 mg total) by mouth every evening. 90 tablet 3  . predniSONE (DELTASONE) 10 MG tablet Take 10 mg by mouth daily.      . Probiotic Product (PROBIOTIC DAILY PO) Take 1 tablet by mouth daily.     . Marland Kitchenropylene Glycol (SYSTANE BALANCE OP) Place 1 drop into the left eye daily.    .  Selenium 200 MCG CAPS Take 1 capsule by mouth daily.      . insulin aspart (NOVOLOG) 100 UNIT/ML injection Inject 8-15 Units into the skin 3 (three) times daily with meals. Rely R  8 units -8 units then 15 at bedtime.     No current facility-administered medications for this visit.   REVIEW OF SYSTEMS: Valu.Nieves ] denotes positive finding; [  ] denotes negative finding  CARDIOVASCULAR:  _0  chest pain   _1  chest pressure   _2  palpitations   _3  orthopnea   _4  dyspnea on exertion   _5  claudication   _6  rest pain   _7  DVT   _8  phlebitis PULMONARY:   _9  productive cough   _10  asthma   _11  wheezing NEUROLOGIC:   _12  weakness  _13  paresthesias  _14  aphasia  _15  amaurosis  _16  dizziness HEMATOLOGIC:   Valu.Nieves ] bleeding problems   _17  clotting disorders MUSCULOSKELETAL:  _18  joint pain   _19  joint swelling Valu.Nieves ] leg swelling GASTROINTESTINAL: _20   blood in stool  _21   hematemesis GENITOURINARY:  _22   dysuria  _23   hematuria PSYCHIATRIC:  _24  history of major depression INTEGUMENTARY:  _25  rashes  _26  ulcers CONSTITUTIONAL:  _27  fever   _28  chills  PHYSICAL EXAM: Filed Vitals:   11/10/14 1401 11/10/14 1409  BP: 143/66 130/69  Pulse: 61 53  Height: _29  (1.626 m)   Weight: 127 lb (57.607 kg)   SpO2: 94%    GENERAL: The patient is a well-nourished female, in no acute distress. The vital signs are documented above. CARDIOVASCULAR: There is a regular rate and rhythm. I do not detect carotid bruits. I cannot palpate pedal pulses however with the Doppler she has brisk signals in the posterior tibial position and monophasic dorsalis pedis signals. PULMONARY: There is good air exchange bilaterally without wheezing or rales. ABDOMEN: Soft and non-tender with normal pitched bowel sounds.  MUSCULOSKELETAL: There are no major deformities or cyanosis. NEUROLOGIC: No focal weakness or paresthesias are detected. SKIN: There is a very superficial wound on the lateral aspect of her right fifth toe. PSYCHIATRIC:  The patient has a normal affect.  DATA:  I have independently interpreted her lower extremity venous duplex scan today which shows no evidence of DVT area there is reflux in the right common femoral vein which was present on her previous study in 2014.  MEDICAL ISSUES: CHRONIC VENOUS INSUFFICIENCY: The patient's leg swelling is actually improved since I saw her last. Currently she has no evidence of cellulitis. She has reasonable arterial flow by Doppler exam. Her treatment for her venous insufficiency has been the use of her venous pump on a regular basis. Because of her mobility limitations she is really not able to position her legs correctly for elevation and has a very difficult time wearing compression stockings. I plan on seeing her back in 1 year. She is to call sooner she has problems.   Return in about 1 year (around 11/10/2015).   [place] [facility] [place] Beeper: [phone]

## 2014-11-17 ENCOUNTER — Ambulatory Visit (INDEPENDENT_AMBULATORY_CARE_PROVIDER_SITE_OTHER): Payer: Medicare Other | Admitting: Nurse Practitioner

## 2014-11-17 ENCOUNTER — Encounter: Payer: Self-pay | Admitting: Nurse Practitioner

## 2014-11-17 VITALS — BP 100/50 | HR 63 | Ht 64.0 in | Wt 129.4 lb

## 2014-11-17 DIAGNOSIS — I5032 Chronic diastolic (congestive) heart failure: Secondary | ICD-10-CM

## 2014-11-17 DIAGNOSIS — I2510 Atherosclerotic heart disease of native coronary artery without angina pectoris: Secondary | ICD-10-CM | POA: Diagnosis not present

## 2014-11-17 DIAGNOSIS — I480 Paroxysmal atrial fibrillation: Secondary | ICD-10-CM | POA: Diagnosis not present

## 2014-11-17 NOTE — Progress Notes (Signed)
Patient Name: Cynthia Hicks Date of Encounter: 11/17/2014  Primary Care Provider:  Bonnita Nasuti, MD Primary Cardiologist:  Einar Crow, MD   Chief Complaint  81 year old female with a history of CAD, diastolic heart failure, and paroxysmal atrial fibrillation who is status post recent admission for Tikosyn loading and presents today for follow-up.  Past Medical History   Past Medical History  Diagnosis Date  . Hypertension   . Arthritis   . Diabetes mellitus   . Coronary artery disease     a.  MI 01/2003;  b.  02/2006 NSTEMI - LAD 80-29m LCX nl, RCA 923m RCA stented w/ Taxus DES;   c.  2009 Neg MV;  d. 11/2010 Echo - EF 65%;  e. 02/2013 MV: LeCarlton Adamardiolite that was low risk with no ischemia.  . Autoimmune liver disease     a.  Followed @ [facility] - on chronic prednisone and  sirolimus  . Hyperlipidemia     a. inability to stake statins due to liver disease  . Cellulitis     a. chronic cellulitis of LE  . CKD (chronic kidney disease), stage IV   . Bulging disc   . Sciatica   . Chronic back pain     a.  On Methadone  . Skin cancer (melanoma)   . Esophageal stricture     narrowing  . Diabetic neuropathy   . Sepsis   . PAF (paroxysmal atrial fibrillation)     a. Tikosyn initiated 10/2014;  CHA2DS2VASc = 6 (eliquis).  . Chronic diastolic CHF (congestive heart failure)     a. 06/2014 TEE: EF 60-65%, Gr 2 DD, mild MR, mildly dil LA, Grade IV descending thoracic Ao plaque.   Past Surgical History  Procedure Laterality Date  . Cardiac catheterization      Normal EF  . Coronary angioplasty  2007    2 DES placement to RCA complicated by a wire perforation  . Back surgery  1992  . Tubal ligation  1982  . Elbow surgery  1994    neuroma removed left  . Abscess drainage      Right side  . Spine surgery    . Lamenectomy  1989  . Heart stents  2007  and  2012    X's 1  and  2012 X's 2 stents  . Cardioversion N/A 10/31/2012    Procedure: CARDIOVERSION;  Surgeon: DaLarey DresserMD;  [facility];  Service: Cardiovascular;  Laterality: N/A;  . Tee without cardioversion N/A 06/22/2014    Procedure: TRANSESOPHAGEAL ECHOCARDIOGRAM (TEE);  Surgeon: DaLarey DresserMD;  [facility];  Service: Cardiovascular;  Laterality: N/A;  . Left heart catheterization with coronary angiogram N/A 08/29/2011    Procedure: LEFT HEART CATHETERIZATION WITH CORONARY ANGIOGRAM;  Surgeon: MuWellington HampshireMD;  [facility];  Service: Cardiovascular;  Laterality: N/A;  . Percutaneous coronary stent intervention (pci-s)  08/29/2011    Procedure: PERCUTANEOUS CORONARY STENT INTERVENTION (PCI-S);  Surgeon: MuWellington HampshireMD;  [facility];  Service: Cardiovascular;;    Allergies  Allergies  Allergen Reactions  . Cyclosporine     Blasted bone marrow  . Azathioprine     Other reaction(s): Unknown  . Mycophenolate Other (See Comments)    Pre-op note mentions "intolerance" to mycophenolate.  Unknown reaction, unknown severity.  . Other     Other reaction(s): Other (See Comments) Uncoded Allergy. Allergen: Other Allergy: See Patient Chart for Details, Other  Reaction: Toxicity  . Oysters [Shellfish Allergy] Nausea And Vomiting    HPI  81 year old female with the above complex problem list. She has a history of CAD and chronic diastolic heart failure. Her heart failure is worse when in A. fib. As result, she was admitted in mid March for Tikosyn loading. She was placed on 125 g every 12 hours and converted to sinus rhythm during hospitalization. QTC remained stable. She was discharged home on March 18. Since discharge, she has done well. She is not aware of any recurrent palpitations or atrial fibrillation. Her volume status has been stable and she denies chest pain, dyspnea, PND, orthopnea, dizziness, syncope, edema, or early satiety. She does have some fatigue which she relates to chronic back pain methadone therapy.  Home Medications  Prior to  Admission medications   Medication Sig Start Date End Date Taking? Authorizing Provider  ACCU-CHEK AVIVA PLUS test strip USE TO CHECK BLOOD SUGAR AS DIRECTED 3 TIMES A DAY 10/18/14  Yes Elayne Snare, MD  Alendronate Sodium (FOSAMAX PO) Take 1 tablet by mouth once a week.   Yes Historical Provider, MD  apixaban (ELIQUIS) 2.5 MG TABS tablet Take 1 tablet (2.5 mg total) by mouth 2 (two) times daily. 09/13/14  Yes Imogene Burn, PA-C  Ascorbic Acid (VITAMIN C) 1000 MG tablet Take 1,000 mg by mouth 2 (two) times daily.    Yes Historical Provider, MD  bisoprolol (ZEBETA) 5 MG tablet Take 1 tablet (5 mg total) by mouth daily. 10/11/14  Yes Larey Dresser, MD  Blood Glucose Monitoring Suppl (ACCU-CHEK AVIVA PLUS) W/DEVICE KIT Use to check blood sugar 3 times per day dx code 250.00 03/10/14  Yes Elayne Snare, MD  calcium carbonate (OS-CAL) 600 MG TABS tablet Take 600 mg by mouth 2 (two) times daily with a meal.   Yes Historical Provider, MD  Cholecalciferol (VITAMIN D PO) Take 2,000 Units by mouth at bedtime.    Yes Historical Provider, MD  Chromium 500 MCG TABS Take 1 tablet by mouth daily.     Yes Historical Provider, MD  Coenzyme Q-10 100 MG capsule Take 100 mg by mouth 2 (two) times daily.   Yes Historical Provider, MD  dofetilide (TIKOSYN) 125 MCG capsule Take 1 capsule (125 mcg total) by mouth 2 (two) times daily. 10/22/14  Yes Rhonda G Barrett, PA-C  ezetimibe (ZETIA) 10 MG tablet Take 10 mg by mouth daily.     Yes Historical Provider, MD  fish oil-omega-3 fatty acids 1000 MG capsule Take 1 g by mouth 2 (two) times daily.    Yes Historical Provider, MD  furosemide (LASIX) 40 MG tablet 1 tablet (28m) by mouth in the AM and 1/2 tablet (292m by mouth in the PM 10/11/14  Yes DaLarey DresserMD  gabapentin (NEURONTIN) 100 MG capsule Take 1 capsule (100 mg total) by mouth 3 (three) times daily. 07/14/14  Yes AjElayne SnareMD  losartan (COZAAR) 25 MG tablet Take 25 mg by mouth daily. 11/03/14  Yes Historical Provider,  MD  magnesium oxide (MAG-OX) 400 (241.3 MG) MG tablet Take 1 tablet (400 mg total) by mouth daily. 10/22/14  Yes Rhonda G Barrett, PA-C  methadone (DOLOPHINE) 10 MG tablet Take 1 tablet (10 mg total) by mouth 2 (two) times daily. 06/24/14  Yes DiNorman HerrlichMD  Multiple Vitamin (MULTIVITAMIN) capsule Take 1 capsule by mouth daily.     Yes Historical Provider, MD  nitroGLYCERIN (NITROSTAT) 0.4 MG SL tablet Place 1 tablet (0.4  mg total) under the tongue every 5 (five) minutes as needed for chest pain. 09/01/11  Yes Rogelia Mire, NP  omeprazole (PRILOSEC) 20 MG capsule Take 20 mg by mouth daily.  06/06/14  Yes Historical Provider, MD  Jonetta Speak LANCETS FINE MISC Use to check blood sugar 3 times per day dx code 250.00 03/03/14  Yes Elayne Snare, MD  oxyCODONE (OXY IR/ROXICODONE) 5 MG immediate release tablet Take 5 mg by mouth 2 (two) times daily as needed for moderate pain.  09/16/14  Yes Historical Provider, MD  potassium chloride SA (K-DUR,KLOR-CON) 20 MEQ tablet Take 1 tablet (20 mEq total) by mouth daily. 10/22/14  Yes Rhonda G Barrett, PA-C  pravastatin (PRAVACHOL) 80 MG tablet Take 1 tablet (80 mg total) by mouth every evening. 10/11/14  Yes Larey Dresser, MD  predniSONE (DELTASONE) 10 MG tablet Take 10 mg by mouth daily.     Yes Historical Provider, MD  Probiotic Product (PROBIOTIC DAILY PO) Take 1 tablet by mouth daily.    Yes Historical Provider, MD  Propylene Glycol (SYSTANE BALANCE OP) Place 1 drop into the left eye daily.   Yes Historical Provider, MD  Selenium 200 MCG CAPS Take 1 capsule by mouth daily.     Yes Historical Provider, MD    Review of Systems  As above, she is doing well without recurrent palpitations or A. fib. She denies chest pain, dyspnea, PND, orthopnea, dizziness, syncope, edema, or early satiety. She has chronic back pain.  All other systems reviewed and are otherwise negative except as noted above.  Physical Exam  VS:  BP 100/50 mmHg  Pulse 63  Ht 5' 4"   (1.626 m)  Wt 129 lb 6.4 oz (58.695 kg)  BMI 22.20 kg/m2 , BMI Body mass index is 22.2 kg/(m^2). GEN: Well nourished, well developed, in no acute distress. HEENT: normal. Neck: Supple, no JVD, carotid bruits, or masses. Cardiac: Irregular, no murmurs, rubs, or gallops. No clubbing, cyanosis, edema.  Radials/DP/PT 2+ and equal bilaterally.  Respiratory:  Respirations regular and unlabored, clear to auscultation bilaterally. GI: Soft, nontender, nondistended, BS + x 4. MS: no deformity or atrophy. Skin: warm and dry, no rash. Neuro:  Strength and sensation are intact. Psych: Normal affect.  Accessory Clinical Findings  ECG - sinus arrhythmia, freq pac's, inf infarct, poor r progression, no acute st/t changes.  Very small P waves - possible wandering atrial pacemaker.  Reviewed with Dr. Meda Coffee and Dr. Rayann Heman. QTC 413 ms  Assessment & Plan  1.  PAF:   Status post Tikosyn loading in March. She has been tolerating this well. ECG shows sinus rhythm. She has very low voltage P waves. I reviewed her ECG today with both Dr. Meda Coffee and on her advice with Dr. Rayann Heman. Patient has been doing relatively well since discharge. She remains on eliquis at a reduced dose given age and weight. I will check a CBC and basic metabolic panel today.  2. Chronic diastolic congestive heart failure: She is euvolemic on exam. Heart rate and blood pressure well controlled.  3. Coronary artery disease: No recent chest pain. Continue beta blocker, Zetia, and statin therapy.  4. Disposition: Follow-up with Dr. Aundra Dubin in 3 months or sooner if necessary.    Murray Hodgkins, NP 11/17/2014, 3:52 PM

## 2014-11-17 NOTE — Patient Instructions (Signed)
Medication Instructions:  None  Labwork: None  Testing/Procedures: None  Follow-Up: Keep follow up with Dr. Aundra Dubin in June  Any Other Special Instructions Will Be Listed Below (If Applicable).

## 2014-12-24 ENCOUNTER — Telehealth: Payer: Self-pay | Admitting: Gastroenterology

## 2014-12-24 NOTE — Telephone Encounter (Signed)
Pt has been scheduled for 02/28/15 to see Dr Ardis Hughs.  Manuela Schwartz will call the pt

## 2015-01-27 ENCOUNTER — Encounter: Payer: Self-pay | Admitting: Cardiology

## 2015-01-27 ENCOUNTER — Ambulatory Visit (INDEPENDENT_AMBULATORY_CARE_PROVIDER_SITE_OTHER): Payer: Medicare Other | Admitting: Cardiology

## 2015-01-27 VITALS — BP 120/56 | HR 61 | Ht 64.0 in | Wt 128.0 lb

## 2015-01-27 DIAGNOSIS — I5032 Chronic diastolic (congestive) heart failure: Secondary | ICD-10-CM

## 2015-01-27 DIAGNOSIS — I2510 Atherosclerotic heart disease of native coronary artery without angina pectoris: Secondary | ICD-10-CM

## 2015-01-27 DIAGNOSIS — I480 Paroxysmal atrial fibrillation: Secondary | ICD-10-CM | POA: Diagnosis not present

## 2015-01-27 MED ORDER — FUROSEMIDE 40 MG PO TABS
ORAL_TABLET | ORAL | Status: DC
Start: 2015-01-27 — End: 2015-02-28

## 2015-01-27 NOTE — Patient Instructions (Signed)
Medication Instructions:  Increase lasix (furosemide) to 40mg  two times a day for 3 days, then decrease back to lasix 40mg  in the AM and 20mg  in the PM.  Labwork: BMET/BNP/CBCd/Magnesium level today  Testing/Procedures: None today  Follow-Up: Your physician recommends that you schedule a follow-up appointment in: 3 months with Dr Aundra Dubin.

## 2015-01-28 LAB — BASIC METABOLIC PANEL
BUN: 34 mg/dL — AB (ref 6–23)
CALCIUM: 9 mg/dL (ref 8.4–10.5)
CO2: 31 mEq/L (ref 19–32)
CREATININE: 1.64 mg/dL — AB (ref 0.40–1.20)
Chloride: 102 mEq/L (ref 96–112)
GFR: 32.08 mL/min — AB (ref >60.00)
GLUCOSE: 104 mg/dL — AB (ref 70–99)
Potassium: 4.9 mEq/L (ref 3.5–5.1)
Sodium: 141 mEq/L (ref 135–145)

## 2015-01-28 LAB — CBC WITH DIFFERENTIAL/PLATELET
Basophils Absolute: 0 10*3/uL (ref 0.0–0.1)
Basophils Relative: 0.3 % (ref 0.0–3.0)
EOS PCT: 0.1 % (ref 0.0–5.0)
Eosinophils Absolute: 0 10*3/uL (ref 0.0–0.7)
HEMATOCRIT: 35.6 % — AB (ref 36.0–46.0)
HEMOGLOBIN: 11.4 g/dL — AB (ref 12.0–15.0)
LYMPHS ABS: 1.2 10*3/uL (ref 0.7–4.0)
Lymphocytes Relative: 8.9 % — ABNORMAL LOW (ref 12.0–46.0)
MCHC: 32.1 g/dL (ref 30.0–36.0)
MCV: 93.4 fl (ref 78.0–100.0)
MONOS PCT: 4.6 % (ref 3.0–12.0)
Monocytes Absolute: 0.6 10*3/uL (ref 0.1–1.0)
Neutro Abs: 11.2 10*3/uL — ABNORMAL HIGH (ref 1.4–7.7)
Neutrophils Relative %: 86.1 % — ABNORMAL HIGH (ref 43.0–77.0)
PLATELETS: 268 10*3/uL (ref 150.0–400.0)
RBC: 3.81 Mil/uL — ABNORMAL LOW (ref 3.87–5.11)
RDW: 23.6 % — AB (ref 11.5–15.5)
WBC: 13 10*3/uL — ABNORMAL HIGH (ref 4.0–10.5)

## 2015-01-28 LAB — MAGNESIUM: MAGNESIUM: 2.2 mg/dL (ref 1.5–2.5)

## 2015-01-28 LAB — BRAIN NATRIURETIC PEPTIDE: Pro B Natriuretic peptide (BNP): 417 pg/mL — ABNORMAL HIGH (ref 0.0–100.0)

## 2015-01-28 NOTE — Progress Notes (Signed)
Patient ID: Cynthia Hicks, female   DOB: Jan 04, 1933, 81 y.o.   MRN: [id number] PCP: Dr. Celedonio Miyamoto  81 yo with history of CAD and autoimmune hepatitis presents for cardiology followup.  She was admitted in 1/13 with NSTEMI.  Left heart cath showed 80% mid LAD stenosis and 99% mid CFX stenosis (likely culprit).  EF was 40-45% by echo and LV-gram.  She had a PROMUS DES to the CFX.  No intervention to the LAD.   Lexiscan myoview done subsequently in 2/13 showed EF 49% with no ischemia.  Echo 4/13 showed EF around 45-50% with basal to mid inferior and mid-apical posterior akinesis along with mild MR.  She was admitted in 12/13 at [facility] with severe cellulitis and sepsis syndrome.  She was seen here by Truitt Merle later in 12/13 and found to be in atrial fibrillation.  Coumadin was started initially, but now she is on Eliquis. In 3/14, I cardioverted her to NSR.  In 7/14, she had an echo showing EF 55-60% with basal inferior hypokinesis and a Lexiscan Cardiolite that was low risk with no ischemia.  She went back into atrial fibrillation in 11/15, and I sent her for DCCV again.  However, she was back in NSR when she showed up for DCCV.  She had to be admitted at that time in 11/15 for PNA.  TEE was done in 11/15 during the PNA admission showing EF 55-60%, mild MR. She was also found to have significant PAD during 11/15 hospitalization.  She was back in atrial fibrillation at her 2/16 appt. In 3/16, she was admitted for Tikosyn and converted back to NSR.  She is in NSR today.   She is very limited by back and shoulder pain.  She is kyphotic.  She uses a walker.  She sleeps in a chair because of the back pain.  No dyspnea walking on flat ground.  No chest pain.  No definite claudication but not very active. Weight is down 3 lbs.  She falls asleep during the day very easily. She has increased lower extremity edema today but admits to eating a bag of potato chips yesterday.   ECG: NSR, septal infarct, lateral infarct,  QTc 406 msec  Labs (4/13): LDL 65, HDL 101, K 3.7, creatinine 1.46, AST 26, ALT 38 Labs (5/13): K 4.4, creatinine 1.54 Labs (10/13): LDL 100, HDL 87 Labs (1/14): K 4.3, creatinine 1.76 Labs (2/14): LDL 65, HDL 109, TGs 111 Labs (4/14): creatinine 1.4 => 1.47, K 4.1, BNP 381 Labs (5/14): K 4.6, creatinine 1.7 Labs (10/14): LDL 68, HDL 80 Labs (6/15): K 4.1, creatinine 1.18 Labs (9/15): HCT 35.2, K 4.3, creatinine 1.9 Labs (11/15): K 5, creatinine 1.38, AST 23, ALT 16 Labs (3/16): K 4.4, creatinine 1.56, LDL 73  PMH: 1. CAD: NSTEMI 1/07.  LHC showed 80-90% mid LAD stenosis and 95% mid RCA stenosis.  She had Taxus DES x 2 (overlapping) to the RCA.  Procedure was complicated by wire perforation of PLV, PLV was coil embolized.  Myoview 2009 showed no ischemia or infarction.  NSTEMI 1/13.  LHC with 80% mid LAD (same as 2007), 99% mid CFX, 70% distal RCA.  PROMUS DES to CFX.  Lexiscan myoview (2/13, to assess LAD territory): EF 49%, no ischemia.  Lexiscan Cardiolite (7/14) with EF 48%, no ischemia.  2. Hyperlipidemia: Unable to take Crestor or Lipitor due to leg weakness.  3. Autoimmune hepatitis: Followed at [facility].  On prednisone and sirolimus.   4. Lumbar disc disease/sciatica.  5. CKD 6. Chronic venous insufficiency with chronic R>L lower leg swelling.  7. PAD: Arterial dopplers (11/15) with tandom significant stenoses in the left mid femoral artery, significant stenosis right mid femoral artery.   8. SVT: suppressed by beta blocker.  9. Ischemic cardiomyopathy: Echo (1/13) with EF 40-45%.  EF 40% by LV-gram (1/13).  Echo (4/13): EF 45-50% with basal to mid inferior and mid to apical posterior akinesis and moderate MR.  Echo (7/14) with EF 55-60%, basal inferior HK, mild LVH.  TEE (11/15) with EF 55-60%, normal RV size and systolic function, mild MR, no LAA thrombus.  10. Carotid dopplers (12/12) with minimal disease.  11. Cholelithiasis/choledocholithiasis s/p ERCP and sphincterotomy in 2013.   12. Atrial fibrillation: First noted in 12/13.  DCCV 3/14. Back in atrial fibrillation in 9/15. She was admitted in 3/16 for dofetilide initiation.  She went back into NSR.  13. Frozen shoulder on right.   SH: Lives in [place], widowed, retired Engineer, site.  Never smoked.    FH: No premature CAD.    ROS: All systems reviewed and negative except as per HPI.   Current Outpatient Prescriptions  Medication Sig Dispense Refill  . morphine (MS CONTIN) 30 MG 12 hr tablet Take 1 tablet by mouth 2 (two) times daily.  0  . ACCU-CHEK AVIVA PLUS test strip USE TO CHECK BLOOD SUGAR AS DIRECTED 3 TIMES A DAY 100 each 0  . Alendronate Sodium (FOSAMAX PO) Take 1 tablet by mouth once a week.    Marland Kitchen apixaban (ELIQUIS) 2.5 MG TABS tablet Take 1 tablet (2.5 mg total) by mouth 2 (two) times daily.    . Ascorbic Acid (VITAMIN C) 1000 MG tablet Take 1,000 mg by mouth 2 (two) times daily.     . bisoprolol (ZEBETA) 5 MG tablet Take 1 tablet (5 mg total) by mouth daily. 90 tablet 3  . Blood Glucose Monitoring Suppl (ACCU-CHEK AVIVA PLUS) W/DEVICE KIT Use to check blood sugar 3 times per day dx code 250.00 1 kit 0  . calcium carbonate (OS-CAL) 600 MG TABS tablet Take 600 mg by mouth 2 (two) times daily with a meal.    . Cholecalciferol (VITAMIN D PO) Take 2,000 Units by mouth at bedtime.     . Chromium 500 MCG TABS Take 1 tablet by mouth daily.      . Coenzyme Q-10 100 MG capsule Take 100 mg by mouth 2 (two) times daily.    Marland Kitchen dofetilide (TIKOSYN) 125 MCG capsule Take 1 capsule (125 mcg total) by mouth 2 (two) times daily. 60 capsule 11  . ezetimibe (ZETIA) 10 MG tablet Take 10 mg by mouth daily.      . fish oil-omega-3 fatty acids 1000 MG capsule Take 1 g by mouth 2 (two) times daily.     . furosemide (LASIX) 40 MG tablet 1 tablet (30m) by mouth in the AM and 1/2 tablet (285m by mouth in the PM 135 tablet 3  . gabapentin (NEURONTIN) 100 MG capsule Take 1 capsule (100 mg total) by mouth 3 (three) times  daily. 270 capsule 1  . losartan (COZAAR) 25 MG tablet Take 25 mg by mouth daily.  3  . magnesium oxide (MAG-OX) 400 (241.3 MG) MG tablet Take 1 tablet (400 mg total) by mouth daily. 30 tablet 11  . Multiple Vitamin (MULTIVITAMIN) capsule Take 1 capsule by mouth daily.      . nitroGLYCERIN (NITROSTAT) 0.4 MG SL tablet Place 1 tablet (0.4 mg total) under  the tongue every 5 (five) minutes as needed for chest pain. 25 tablet 3  . omeprazole (PRILOSEC) 20 MG capsule Take 20 mg by mouth daily.     Glory Rosebush DELICA LANCETS FINE MISC Use to check blood sugar 3 times per day dx code 250.00 100 each 3  . oxyCODONE (OXY IR/ROXICODONE) 5 MG immediate release tablet Take 5 mg by mouth 2 (two) times daily as needed for moderate pain.   0  . potassium chloride SA (K-DUR,KLOR-CON) 20 MEQ tablet Take 1 tablet (20 mEq total) by mouth daily. 30 tablet 11  . pravastatin (PRAVACHOL) 80 MG tablet Take 1 tablet (80 mg total) by mouth every evening. 90 tablet 3  . predniSONE (DELTASONE) 10 MG tablet Take 10 mg by mouth daily.      . Probiotic Product (PROBIOTIC DAILY PO) Take 1 tablet by mouth daily.     Marland Kitchen Propylene Glycol (SYSTANE BALANCE OP) Place 1 drop into the left eye daily.    . Selenium 200 MCG CAPS Take 1 capsule by mouth daily.       No current facility-administered medications for this visit.   BP 120/56 mmHg  Pulse 61  Ht _0  (1.626 m)  Wt 128 lb (58.06 kg)  BMI 21.96 kg/m2 General: NAD Neck: JVP 8 cm, no thyromegaly or thyroid nodule.  Lungs: Clear to auscultation bilaterally with normal respiratory effort. CV: Nondisplaced PMI.  Heart regular S1/S2, no S3/S4, no murmur.  2+ edema 3/4 to knees bilaterally.  I cannot palpate pedal pulses.  Abdomen: Soft, nontender, no hepatosplenomegaly, no distention.   Neurologic: Alert and oriented x 3.  Psych: Normal affect. Extremities: No clubbing or cyanosis. Venous stasis changes lower legs bilaterally.   Assessment/Plan:  Chronic kidney disease   Relatively stable.  Repeat BMET today.    Coronary artery disease  NSTEMI with Promus DES to CFX in 1/13. Has residual 80% mid LAD stenosis. This was also seen on 2007 cath, and Lexiscan myoview in 7/14 showed no ischemia.  No chest pain.  - She is anticoagulated with stable CAD, so not on ASA.  - She will continue ARB, beta blocker, and statin.  HYPERLIPIDEMIA Good lipids in 3/16.  Chronic diastolic CHF EF 21-97% on 11/15 TEE.  NYHA class III symptoms.  She has some volume overload on exam that may be related to recent dietary indiscretion.   - Increase Lasix to 40 mg bid x 3 days then go back to 40 qam/20 qpm.  Needs to watch sodium in her diet very closely.      Atrial fibrillation She is in NSR on dofetilide.  Check BMET and Mg. Continue dofetilide and apixaban.  QTc ok on ECG today.  PAD She has followup scheduled with [facility] (Dr Scot Dock).   Followup in 3 months.   [name] 01/28/2015

## 2015-02-11 ENCOUNTER — Other Ambulatory Visit: Payer: Self-pay

## 2015-02-11 MED ORDER — APIXABAN 2.5 MG PO TABS: 2.5000 mg | ORAL_TABLET | Freq: Two times a day (BID) | ORAL | Status: DC

## 2015-02-16 ENCOUNTER — Telehealth: Payer: Self-pay

## 2015-02-16 NOTE — Telephone Encounter (Signed)
Approval from  [facility] for Eliquis 2.5mg  tabs. Event [id number], good through 02/15/2016.

## 2015-02-25 ENCOUNTER — Telehealth: Payer: Self-pay | Admitting: Cardiology

## 2015-02-25 DIAGNOSIS — I5032 Chronic diastolic (congestive) heart failure: Secondary | ICD-10-CM

## 2015-02-25 NOTE — Telephone Encounter (Signed)
Pt states she is having swelling at her knees to where it hurts to bend them.  She denies SOB but reports wt is up 9lbs in 3 weeks.  No cough.  States she watches her diet very carefully.  She weighs daily.  States she would like to see Dr Aundra Dubin.  Advised pt I will forward this information to Webb Silversmith to look for an appointment or Dr Aundra Dubin may want to treat her with medication changes and f/u labs.  Pt states she will try taking Furosemide 40 BID if s/s gets worse over the weekend.

## 2015-02-25 NOTE — Telephone Encounter (Signed)
Pt c/o swelling: STAT is pt has developed SOB within 24 hours  1. How long have you been experiencing swelling? 4-5 days   2. Where is the swelling located? Feet/ankles/knees; also hands  3.  Are you currently taking a "fluid pill"? Yes- flurosemdide  4.  Are you currently SOB? no  5.  Have you traveled recently? no

## 2015-02-28 ENCOUNTER — Telehealth: Payer: Self-pay

## 2015-02-28 ENCOUNTER — Ambulatory Visit (INDEPENDENT_AMBULATORY_CARE_PROVIDER_SITE_OTHER): Payer: Medicare Other | Admitting: Gastroenterology

## 2015-02-28 ENCOUNTER — Other Ambulatory Visit (INDEPENDENT_AMBULATORY_CARE_PROVIDER_SITE_OTHER): Payer: Medicare Other | Admitting: *Deleted

## 2015-02-28 ENCOUNTER — Encounter: Payer: Self-pay | Admitting: Gastroenterology

## 2015-02-28 VITALS — BP 142/60 | HR 64 | Ht 63.0 in | Wt 132.2 lb

## 2015-02-28 DIAGNOSIS — I5032 Chronic diastolic (congestive) heart failure: Secondary | ICD-10-CM | POA: Diagnosis not present

## 2015-02-28 DIAGNOSIS — I480 Paroxysmal atrial fibrillation: Secondary | ICD-10-CM

## 2015-02-28 DIAGNOSIS — R1310 Dysphagia, unspecified: Secondary | ICD-10-CM

## 2015-02-28 LAB — BASIC METABOLIC PANEL
BUN: 33 mg/dL — ABNORMAL HIGH (ref 6–23)
CO2: 32 mEq/L (ref 19–32)
Calcium: 9.2 mg/dL (ref 8.4–10.5)
Chloride: 101 mEq/L (ref 96–112)
Creatinine, Ser: 1.75 mg/dL — ABNORMAL HIGH (ref 0.40–1.20)
GFR: 29.76 mL/min — ABNORMAL LOW (ref >60.00)
GLUCOSE: 134 mg/dL — AB (ref 70–99)
Potassium: 4.3 mEq/L (ref 3.5–5.1)
Sodium: 142 mEq/L (ref 135–145)

## 2015-02-28 NOTE — Telephone Encounter (Signed)
Would continue Lasix at 40 mg bid, have her see me in CHF clinic next week.  Needs BMET this week though.

## 2015-02-28 NOTE — Patient Instructions (Addendum)
We will get records sent from your previous gastroenterologist at [facility] for review (2013 ERCP report with any associated pathology)  If you start to have trouble with your bowels again, you should reconsider colonoscopy for evaluation. You will be set up for an upper endoscopy +/- balloon dilation at [facility] with MAC sedation.  We will contact your cardiology about holding your blood thinner eliquis for 2 days prior to EGD (Dr. Benjamine Mola). In meantime chew your food well, eat slowly and take small bites.

## 2015-02-28 NOTE — Telephone Encounter (Signed)
Pt states her weight is down 3 pounds today, pt states she is a little SOB with exertion but otherwise SOB not bad.  Pt states she took lasix 40mg  bid over the weekend and thinks that has helped edema overall. Pt states she has cellulitis, uses a lymphedema machine that helps until she gets up and starts moving around.  Pt states  her feet and legs still swollen to thighs,  edema improved since Friday. Pt states she will go back on her usual dose of lasix 40 AM/20PM today. Pt advised I will forward to Dr Aundra Dubin for review.

## 2015-02-28 NOTE — Telephone Encounter (Signed)
Pt advised, verbalized understanding, will probably come for BMET today, will ask PCP to contact pt for appt in CHF clinic next week.

## 2015-02-28 NOTE — Telephone Encounter (Signed)
May hold Eliquis x 2 days.      ----- Message -----     From: Barron Alvine, CMA     Sent: 02/28/2015 10:41 AM      To: Larey Dresser, MD    Pt has been notified she will call with any questions

## 2015-02-28 NOTE — Progress Notes (Signed)
HPI: This is a very pleasant 81-year-old woman who was referred to me by Haque, Imran P, MD  to evaluate  dysphasia  .    Chief complaint is dysphagia    I last saw her May 2013 for right lower quadrant discomforts. She was also having some intermittent rectal bleeding. On review it appeared that she had had a right groin abscess 2 or 3 years before that appointment area I was concerned that she had another similar process going on or perhaps scar tissue related to the previous abscess. I recommended imaging including an MRI however she never had that done. I also recommended colonoscopy however she declined. The last I heard was several days after her 2013 visit she was heading to [facility] for her right lower quadrant pains. We have not heard from her since.  I was able to view limited records from [facility] from August 2013. There are some documents that seem to suggest she had an ERCP around that time for stone versus polyp in her bile duct. I cannot find any clear imaging reports, I cannot find the ERCP procedure report.  She was told she needed her GB to be removed.  She declined.  Last week she had right sided abdominal pains, US done in [place] and it showed gallstones in her GB, she was referred to general surgery, appt is tomorrow. These have been going on for about a month and can actually be positional at times.  She has difficulty swallowing and that is why she is here today.  This has been an issue for about 10 years.  Not getting worse.  Meats and breads seem to hang, this occurs about once daily.  Has never had EGD, dilation.    About a year ago she was evaluated for this same problem in [place], was set for barium esophagram but she cancelled that appt.  She also has left sided abdominal pains and loose stools recently.  Used to be constipated, chronically.  About a month ago, 4-6 loose stools a day that lasted for 3-4 weeks.  She understands she had fobt + stool  recently, per her PCP.  Has been gaining weight recently.  Review of systems: Pertinent positive and negative review of systems were noted in the above HPI section. Complete review of systems was performed and was otherwise normal.   Past Medical History  Diagnosis Date  . Hypertension   . Arthritis   . Diabetes mellitus   . Coronary artery disease     a.  MI 01/2003;  b.  02/2006 NSTEMI - LAD 80-90m, LCX nl, RCA 95m - RCA stented w/ Taxus DES;   c.  2009 Neg MV;  d. 11/2010 Echo - EF 65%;  e. 02/2013 MV: Lexiscan Cardiolite that was low risk with no ischemia.  . Autoimmune liver disease     a.  Followed @ [facility] - on chronic prednisone and  sirolimus  . Hyperlipidemia     a. inability to stake statins due to liver disease  . Cellulitis     a. chronic cellulitis of LE  . CKD (chronic kidney disease), stage IV   . Bulging disc   . Sciatica   . Chronic back pain     a.  On Methadone  . Skin cancer (melanoma)   . Esophageal stricture     narrowing  . Diabetic neuropathy   . Sepsis   . PAF (paroxysmal atrial fibrillation)     a. Tikosyn initiated   10/2014;  CHA2DS2VASc = 6 (eliquis).  . Chronic diastolic CHF (congestive heart failure)     a. 06/2014 TEE: EF 60-65%, Gr 2 DD, mild MR, mildly dil LA, Grade IV descending thoracic Ao plaque.    Past Surgical History  Procedure Laterality Date  . Cardiac catheterization      Normal EF  . Coronary angioplasty  2007    2 DES placement to RCA complicated by a wire perforation  . Tubal ligation  1982  . Elbow surgery Left 1994    neuroma removed   . Abscess drainage Right     side  . Lamenectomy  1989    only 1 disc surgery  . Heart stents  2007  and  2012    X's 1  and  2012 X's 2 stents  . Cardioversion N/A 10/31/2012    Procedure: CARDIOVERSION;  Surgeon: Dalton S McLean, MD;  Location: [facility] END[facility];  Service: Cardiovascular;  Laterality: N/A;  . Tee without cardioversion N/A 06/22/2014    Procedure: TRANSESOPHAGEAL ECHOCARDIOGRAM  (TEE);  Surgeon: Dalton S McLean, MD;  Location: [facility] ENDOSCOPY;  Service: Cardiovascular;  Laterality: N/A;  . Left heart catheterization with coronary angiogram N/A 08/29/2011    Procedure: LEFT HEART CATHETERIZATION WITH CORONARY ANGIOGRAM;  Surgeon: Muhammad A Arida, MD;  Location: [facility] CATH LAB;  Service: Cardiovascular;  Laterality: N/A;  . Percutaneous coronary stent intervention (pci-s)  08/29/2011    Procedure: PERCUTANEOUS CORONARY STENT INTERVENTION (PCI-S);  Surgeon: Muhammad A Arida, MD;  Location: [facility] CATH LAB;  Service: Cardiovascular;;    Current Outpatient Prescriptions  Medication Sig Dispense Refill  . ACCU-CHEK AVIVA PLUS test strip USE TO CHECK BLOOD SUGAR AS DIRECTED 3 TIMES A DAY 100 each 0  . Alendronate Sodium (FOSAMAX PO) Take 1 tablet by mouth once a week.    . apixaban (ELIQUIS) 2.5 MG TABS tablet Take 1 tablet (2.5 mg total) by mouth 2 (two) times daily. 60 tablet 1  . Ascorbic Acid (VITAMIN C) 1000 MG tablet Take 1,000 mg by mouth 2 (two) times daily.     . bisoprolol (ZEBETA) 5 MG tablet Take 1 tablet (5 mg total) by mouth daily. 90 tablet 3  . Blood Glucose Monitoring Suppl (ACCU-CHEK AVIVA PLUS) W/DEVICE KIT Use to check blood sugar 3 times per day dx code 250.00 1 kit 0  . calcium carbonate (OS-CAL) 600 MG TABS tablet Take 600 mg by mouth 2 (two) times daily with a meal.    . Cholecalciferol (VITAMIN D PO) Take 2,000 Units by mouth at bedtime.     . Chromium 500 MCG TABS Take 1 tablet by mouth daily.      . Coenzyme Q-10 100 MG capsule Take 100 mg by mouth 2 (two) times daily.    . dofetilide (TIKOSYN) 125 MCG capsule Take 1 capsule (125 mcg total) by mouth 2 (two) times daily. 60 capsule 11  . ezetimibe (ZETIA) 10 MG tablet Take 10 mg by mouth daily.      . fish oil-omega-3 fatty acids 1000 MG capsule Take 1 g by mouth 2 (two) times daily.     . gabapentin (NEURONTIN) 100 MG capsule Take 1 capsule (100 mg total) by mouth 3 (three) times daily. 270 capsule 1  .  losartan (COZAAR) 25 MG tablet Take 25 mg by mouth daily.  3  . magnesium oxide (MAG-OX) 400 (241.3 MG) MG tablet Take 1 tablet (400 mg total) by mouth daily. 30 tablet 11  . morphine (MS CONTIN)   60 MG 12 hr tablet Take 1 tablet by mouth 2 (two) times daily.    . Multiple Vitamin (MULTIVITAMIN) capsule Take 1 capsule by mouth daily.      . nitroGLYCERIN (NITROSTAT) 0.4 MG SL tablet Place 1 tablet (0.4 mg total) under the tongue every 5 (five) minutes as needed for chest pain. 25 tablet 3  . omeprazole (PRILOSEC) 20 MG capsule Take 20 mg by mouth daily.     Glory Rosebush DELICA LANCETS FINE MISC Use to check blood sugar 3 times per day dx code 250.00 100 each 3  . oxyCODONE (OXY IR/ROXICODONE) 5 MG immediate release tablet Take 5 mg by mouth 2 (two) times daily as needed for moderate pain.   0  . potassium chloride SA (K-DUR,KLOR-CON) 20 MEQ tablet Take 1 tablet (20 mEq total) by mouth daily. 30 tablet 11  . pravastatin (PRAVACHOL) 80 MG tablet Take 1 tablet (80 mg total) by mouth every evening. 90 tablet 3  . predniSONE (DELTASONE) 10 MG tablet Take 10 mg by mouth daily.      . Probiotic Product (PROBIOTIC DAILY PO) Take 1 tablet by mouth daily.     Marland Kitchen Propylene Glycol (SYSTANE BALANCE OP) Place 1 drop into the left eye daily.    . Selenium 200 MCG CAPS Take 1 capsule by mouth daily.      . [DISCONTINUED] furosemide (LASIX) 40 MG tablet 1 tablet (39m) by mouth in the AM and 1/2 tablet (242m by mouth in the PM 135 tablet 3   No current facility-administered medications for this visit.    Allergies as of 02/28/2015 - Review Complete 02/28/2015  Allergen Reaction Noted  . Cyclosporine  06/18/2014  . Azathioprine  03/03/2014  . Mycophenolate Other (See Comments) 06/23/2014  . Other  03/03/2014  . Oysters [shellfish allergy] Nausea And Vomiting 10/19/2014    Family History  Problem Relation Age of Onset  . Lung cancer Mother 4451. Heart attack Father 7932. Heart disease Father   .  Congestive Heart Failure Father   . Heart disease Maternal Grandfather   . Hypertension Brother   . Hypertension Son   . Pancreatic cancer Mother     History   Social History  . Marital Status: Married    Spouse Name: JoJenny Reichmann. Number of Children: 2  . Years of Education: N/A   Occupational History  . real esSport and exercise psychologist   retired   Social History Main Topics  . Smoking status: Never Smoker   . Smokeless tobacco: Never Used  . Alcohol Use: No  . Drug Use: No  . Sexual Activity: Not on file   Other Topics Concern  . Not on file   Social History Narrative   [place]  She is retired.  She is not very active @ home.     Physical Exam: BP 142/60 mmHg  Pulse 64  Ht 5' 3" (1.6 m)  Wt 132 lb 4 oz (59.988 kg)  BMI 23.43 kg/m2 Constitutional: Stooped stature Psychiatric: alert and oriented x3 Eyes: extraocular movements intact Mouth: oral pharynx moist, no lesions Neck: supple no lymphadenopathy Cardiovascular: heart regular rate and rhythm Lungs: clear to auscultation bilaterally Abdomen: soft, nontender, nondistended, no obvious ascites, no peritoneal signs, normal bowel sounds Extremities: no lower extremity edema bilaterally Skin: no lesions on visible extremities   Assessment and plan: [age].o. female with  recent change in bowels, chronic intermittent dysphasia, right upper quadrant  pains  She had choledocholithiasis treated by ERCP at [facility] 3 years ago. She was recommended to have a gallbladder removed but she never did. She has been having recent abdominal pains and her primary care physician found that she had gallstones in her gallbladder and she is meeting a Psychologist, sport and exercise later this week to discuss cholecystectomy. I agree that she needs her gallbladder out. She had recent bowel changes. I explained to her the colonoscopy was probably the best test for those changes however she declined just that she declined 3 years ago when I offered  colonoscopy. Her intermittent dysphasia is unlikely anything serious given her weight gain. I did recommend upper endoscopy with balloon dilation pending the findings. She is on a chronic blood thinner for coronary artery disease we will contact her cardiologist for safety of holding her Eliquis for 2 days prior to upper endoscopy.   Owens Loffler, MD [facility] 02/28/2015, 10:21 AM  Cc: Raelyn Number, MD

## 2015-02-28 NOTE — Telephone Encounter (Signed)
02/28/2015   RE: Cynthia Hicks DOB: [birth year] MRN: [id number]   Dear Dr Aundra Dubin,    We have scheduled the above patient for an endoscopic procedure. Our records show that she is on anticoagulation therapy.   Please advise as to how long the patient may come off her therapy of Eliquis prior to the procedure.  Please fax back/ or route the completed form to Sherece Gambrill at [phone].   Sincerely,    Christian Mate CMA(AAMA)

## 2015-03-03 ENCOUNTER — Ambulatory Visit (HOSPITAL_COMMUNITY)
Admission: RE | Admit: 2015-03-03 | Discharge: 2015-03-03 | Disposition: A | Discharge status: Home / Self Care | Payer: Medicare Other | Source: Ambulatory Visit | Attending: Cardiology | Admitting: Cardiology

## 2015-03-03 ENCOUNTER — Encounter (HOSPITAL_COMMUNITY): Payer: Self-pay

## 2015-03-03 VITALS — BP 112/60 | HR 78 | Wt 131.2 lb

## 2015-03-03 DIAGNOSIS — I5032 Chronic diastolic (congestive) heart failure: Secondary | ICD-10-CM | POA: Diagnosis present

## 2015-03-03 MED ORDER — METOLAZONE 2.5 MG PO TABS: 2.5000 mg | ORAL_TABLET | ORAL | Status: DC

## 2015-03-03 NOTE — Patient Instructions (Addendum)
START Metolazone 2.5 mg tablet once weekly. Take extra 20 meq potassium tablet with Metolazone.  Will schedule you for a Lexiscan Myoview Stress Test at [facility]. [place] [facility] [place]. [address] [place], [place] [address] [phone] Please wear comfortable clothes and shoes for this test. Avoid heavy meal before the test (light snack/meal recommended). Avoid caffeine, alcohol, tobacco products 12 hrs before test.  Return in 1-2 weeks for lab work.  Follow up 3-4 weeks.  Do the following things EVERYDAY: 1) Weigh yourself in the morning before breakfast. Write it down and keep it in a log. 2) Take your medicines as prescribed 3) Eat low salt foods-Limit salt (sodium) to 2000 mg per day.  4) Stay as active as you can everyday 5) Limit all fluids for the day to less than 2 liters

## 2015-03-03 NOTE — Progress Notes (Signed)
Patient ID: Cynthia Hicks, female   DOB: Oct 22, 1932, 81 y.o.   MRN: [id number] PCP: Dr. Celedonio Miyamoto General Surgeon:  Dr Abran Cantor [phone]  Primary HF: Dr Aundra Dubin.   Cynthia Hicks is a 81 yo woman with a history of PAF, diastolic HF, PAD, CAD and autoimmune hepatitis presents for cardiology followup.    She was admitted in 1/13 with NSTEMI.  Left heart cath showed 80% mid LAD stenosis and 99% mid CFX stenosis (likely culprit).  EF was 40-45% by echo and LV-gram.  She had a PROMUS DES to the CFX.  No intervention to the LAD.   Lexiscan myoview done subsequently in 2/13 showed EF 49% with no ischemia.  Echo 4/13 showed EF around 45-50% with basal to mid inferior and mid-apical posterior akinesis along with mild MR.  She was admitted in 12/13 at [facility] with severe cellulitis and sepsis syndrome.  She was seen here by Truitt Merle later in 12/13 and found to be in atrial fibrillation.  Coumadin was started initially, but now she is on Eliquis. In 3/14, she was cardioverted.  In 7/14, she had an echo showing EF 55-60% with basal inferior hypokinesis and a Lexiscan Cardiolite that was low risk with no ischemia.  She went back into atrial fibrillation in 11/15 but was back in NSR when she showed up for DCCV.  She had to be admitted at that time in 11/15 for PNA.  TEE was done in 11/15 during the PNA admission showing EF 55-60%, mild MR. She was also found to have significant PAD during 11/15 hospitalization.  She was back in atrial fibrillation at her 2/16 appt. In 3/16, she was admitted for Tikosyn and converted back to NSR.  She is in NSR today.   She returns for HF follow up and pre-op clearance. Last week lasix was increaed to 40 mg twice a day. Weight went up to 134 and now back down to 129 pounds. Chronic low extremity edema. Uses lymphedema machine twice a day. Chronic back pain. Mild dyspnea with exertion. Denies PND/Orthopnea. Able to perform all ADLs. No bleeding problems. She has been struggling with ab  pain and felt to have chronic cholecystitis. Lap chole pending. [facility] requesting pre-op evaluation.   Labs (4/13): LDL 65, HDL 101, K 3.7, creatinine 1.46, AST 26, ALT 38 Labs (5/13): K 4.4, creatinine 1.54 Labs (10/13): LDL 100, HDL 87 Labs (1/14): K 4.3, creatinine 1.76 Labs (2/14): LDL 65, HDL 109, TGs 111 Labs (4/14): creatinine 1.4 => 1.47, K 4.1, BNP 381 Labs (5/14): K 4.6, creatinine 1.7 Labs (10/14): LDL 68, HDL 80 Labs (6/15): K 4.1, creatinine 1.18 Labs (9/15): HCT 35.2, K 4.3, creatinine 1.9 Labs (11/15): K 5, creatinine 1.38, AST 23, ALT 16 Labs (3/16): K 4.4, creatinine 1.56, LDL 73 Labs 01/27/15: K 4.9 Creatinine 1.64 Hgb 11.4   PMH: 1. CAD: NSTEMI 1/07.  LHC showed 80-90% mid LAD stenosis and 95% mid RCA stenosis.  She had Taxus DES x 2 (overlapping) to the RCA.  Procedure was complicated by wire perforation of PLV, PLV was coil embolized.  Myoview 2009 showed no ischemia or infarction.  NSTEMI 1/13.  LHC with 80% mid LAD (same as 2007), 99% mid CFX, 70% distal RCA.  PROMUS DES to CFX.  Lexiscan myoview (2/13, to assess LAD territory): EF 49%, no ischemia.  Lexiscan Cardiolite (7/14) with EF 48%, no ischemia.  2. Hyperlipidemia: Unable to take Crestor or Lipitor due to leg weakness.  3. Autoimmune hepatitis: Followed at  [facility].  On prednisone and sirolimus.   4. Lumbar disc disease/sciatica.  5. CKD 6. Chronic venous insufficiency with chronic R>L lower leg swelling.  7. PAD: Arterial dopplers (11/15) with tandom significant stenoses in the left mid femoral artery, significant stenosis right mid femoral artery.   8. SVT: suppressed by beta blocker.  9. Ischemic cardiomyopathy: Echo (1/13) with EF 40-45%.  EF 40% by LV-gram (1/13).  Echo (4/13): EF 45-50% with basal to mid inferior and mid to apical posterior akinesis and moderate MR.  Echo (7/14) with EF 55-60%, basal inferior HK, mild LVH.  TEE (11/15) with EF 55-60%, normal RV size and systolic function, mild MR, no LAA thrombus.   10. Carotid dopplers (12/12) with minimal disease.  11. Cholelithiasis/choledocholithiasis s/p ERCP and sphincterotomy in 2013.  12. Atrial fibrillation: First noted in 12/13.  DCCV 3/14. Back in atrial fibrillation in 9/15. She was admitted in 3/16 for dofetilide initiation.  She went back into NSR.  13. Frozen shoulder on right.   SH: Lives in [place], widowed, retired Engineer, site.  Never smoked.    FH: No premature CAD.    ROS: All systems reviewed and negative except as per HPI.   Current Outpatient Prescriptions  Medication Sig Dispense Refill  . ACCU-CHEK AVIVA PLUS test strip USE TO CHECK BLOOD SUGAR AS DIRECTED 3 TIMES A DAY 100 each 0  . Alendronate Sodium (FOSAMAX PO) Take 1 tablet by mouth once a week.    Marland Kitchen apixaban (ELIQUIS) 2.5 MG TABS tablet Take 1 tablet (2.5 mg total) by mouth 2 (two) times daily. 60 tablet 1  . Ascorbic Acid (VITAMIN C) 1000 MG tablet Take 1,000 mg by mouth 2 (two) times daily.     . bisoprolol (ZEBETA) 5 MG tablet Take 1 tablet (5 mg total) by mouth daily. 90 tablet 3  . Blood Glucose Monitoring Suppl (ACCU-CHEK AVIVA PLUS) W/DEVICE KIT Use to check blood sugar 3 times per day dx code 250.00 1 kit 0  . calcium carbonate (OS-CAL) 600 MG TABS tablet Take 600 mg by mouth 2 (two) times daily with a meal.    . Cholecalciferol (VITAMIN D PO) Take 2,000 Units by mouth at bedtime.     . Chromium 500 MCG TABS Take 1 tablet by mouth daily.      . Coenzyme Q-10 100 MG capsule Take 100 mg by mouth 2 (two) times daily.    Marland Kitchen dofetilide (TIKOSYN) 125 MCG capsule Take 1 capsule (125 mcg total) by mouth 2 (two) times daily. 60 capsule 11  . ezetimibe (ZETIA) 10 MG tablet Take 10 mg by mouth daily.      . fish oil-omega-3 fatty acids 1000 MG capsule Take 1 g by mouth 2 (two) times daily.     . furosemide (LASIX) 40 MG tablet Take 1 tablet (40 mg total) by mouth 2 (two) times daily.    Marland Kitchen gabapentin (NEURONTIN) 100 MG capsule Take 1 capsule (100 mg total) by mouth  3 (three) times daily. 270 capsule 1  . losartan (COZAAR) 25 MG tablet Take 25 mg by mouth daily.  3  . magnesium oxide (MAG-OX) 400 (241.3 MG) MG tablet Take 1 tablet (400 mg total) by mouth daily. 30 tablet 11  . morphine (MS CONTIN) 60 MG 12 hr tablet Take 1 tablet by mouth 2 (two) times daily.    . Multiple Vitamin (MULTIVITAMIN) capsule Take 1 capsule by mouth daily.      Marland Kitchen omeprazole (PRILOSEC) 20 MG capsule  Take 20 mg by mouth daily.     Glory Rosebush DELICA LANCETS FINE MISC Use to check blood sugar 3 times per day dx code 250.00 100 each 3  . oxyCODONE (OXY IR/ROXICODONE) 5 MG immediate release tablet Take 5 mg by mouth 2 (two) times daily as needed for moderate pain.   0  . potassium chloride SA (K-DUR,KLOR-CON) 20 MEQ tablet Take 1 tablet (20 mEq total) by mouth daily. 30 tablet 11  . pravastatin (PRAVACHOL) 80 MG tablet Take 1 tablet (80 mg total) by mouth every evening. 90 tablet 3  . predniSONE (DELTASONE) 10 MG tablet Take 10 mg by mouth daily.      . Probiotic Product (PROBIOTIC DAILY PO) Take 1 tablet by mouth daily.     Marland Kitchen Propylene Glycol (SYSTANE BALANCE OP) Place 1 drop into the left eye daily.    . Selenium 200 MCG CAPS Take 1 capsule by mouth daily.      . nitroGLYCERIN (NITROSTAT) 0.4 MG SL tablet Place 1 tablet (0.4 mg total) under the tongue every 5 (five) minutes as needed for chest pain. (Patient not taking: Reported on 03/03/2015) 25 tablet 3   No current facility-administered medications for this encounter.   BP 112/60 mmHg  Pulse 78  Wt 131 lb 4 oz (59.535 kg)  SpO2 95% General: NAD. Elderly kyphotic. Neck: JVP 8 cm, no thyromegaly or thyroid nodule.  Lungs: Clear to auscultation bilaterally with normal respiratory effort. CV: Nondisplaced PMI.  Heart regular S1/S2, no S3/S4, no murmur.  R and LLE thigh edema. 2-3+ edema 3/4 to knees bilaterally.  I cannot palpate pedal pulses.  Abdomen: Soft, nontender, no hepatosplenomegaly, no distention.   Neurologic: Alert  and oriented x 3.  Psych: Normal affect. Extremities: No clubbing or cyanosis. Venous stasis changes lower legs bilaterally.   Assessment/Plan:  1) Chronic diastolic CHF EF 23-30% on 11/15 TEE.   NYHA class III symptoms. Volume status hard to assess due to lymphedema. Looks ok. Red Vest Score 28. - Continue lasix  40 mg bid twice a day. Will add 2.5 mg metolazone once a week and follow closely.  -Continue lymphedema boots - Check BMET in 2 weeks.   2) Chronic kidney disease  Relatively stable.  Repeat BMET today.    3) Coronary artery disease  NSTEMI with Promus DES to CFX in 1/13. Has residual 80% mid LAD stenosis. This was also seen on 2007 cath, and Lexiscan myoview in 7/14 showed no ischemia.  No chest pain.  - She is anticoagulated with apixaban.  Stable CAD, so not on ASA.  - She will continue ARB, beta blocker, and statin.  4) HYPERLIPIDEMIA Good lipids in 3/16.  5) Paroxysmal Atrial fibrillation She is in NSR on dofetilide.  Continue dofetilide and apixaban.   6) PAD She has followup scheduled with [facility] (Dr Scot Dock).  7) Abd Pain- General Surgery requesting cardiac clearance for cholecystectomy. Dr Abran Cantor [phone]  8) Chronic Lower Extremity Lymphedema- Using lymph edema machine twice a day.   Set up [facility] for cardia clearance.   Follow up in 3 months.   CLEGG,AMY NP-C  03/03/2015  Patient seen and examined with Darrick Grinder, NP. We discussed all aspects of the encounter. I agree with the assessment and plan as stated above.   Overall stable. She has severe LE edema but likely related more to lymphedema. Will continue lymphedema boots. Add metolazone 1x/week and see if this helps. Watch renal function closely. Functional status quite poor. Cannot  walk more than a few yards. Will get Myoview prior to clearing for surgery. Unless Myoview is high-risk would allow her to go to surgery with a low to moderate risk of CV complications. Would hold apixaban 48 hours  preoperatively and start back the morning after surgery unless otherwise contraindicated.   Total time spent 40 minutes. Over half that time spent discussing above.   Bensimhon, Daniel,MD 10:35 PM

## 2015-03-04 ENCOUNTER — Telehealth (HOSPITAL_COMMUNITY): Payer: Self-pay | Admitting: *Deleted

## 2015-03-04 NOTE — Telephone Encounter (Signed)
Patient given detailed instructions per Myocardial Perfusion Study Information Sheet for test on 03/07/2015 at 11:45. Patient Notified to arrive 15 minutes early, and that it is imperative to arrive on time for appointment to keep from having the test rescheduled. Patient verbalized understanding. Veronia Beets

## 2015-03-07 ENCOUNTER — Other Ambulatory Visit: Payer: Self-pay

## 2015-03-07 ENCOUNTER — Ambulatory Visit (HOSPITAL_COMMUNITY): Payer: Medicare Other | Attending: Cardiovascular Disease

## 2015-03-07 DIAGNOSIS — I10 Essential (primary) hypertension: Secondary | ICD-10-CM | POA: Insufficient documentation

## 2015-03-07 DIAGNOSIS — R0789 Other chest pain: Secondary | ICD-10-CM | POA: Insufficient documentation

## 2015-03-07 DIAGNOSIS — I480 Paroxysmal atrial fibrillation: Secondary | ICD-10-CM | POA: Insufficient documentation

## 2015-03-07 DIAGNOSIS — I5032 Chronic diastolic (congestive) heart failure: Secondary | ICD-10-CM | POA: Diagnosis present

## 2015-03-07 DIAGNOSIS — E119 Type 2 diabetes mellitus without complications: Secondary | ICD-10-CM | POA: Diagnosis not present

## 2015-03-07 DIAGNOSIS — I2510 Atherosclerotic heart disease of native coronary artery without angina pectoris: Secondary | ICD-10-CM | POA: Diagnosis not present

## 2015-03-07 MED ORDER — REGADENOSON 0.4 MG/5ML IV SOLN
0.4000 mg | Freq: Once | INTRAVENOUS | Status: AC
  Administered 2015-03-07: 0.4 mg via INTRAVENOUS

## 2015-03-07 MED ORDER — TECHNETIUM TC 99M SESTAMIBI GENERIC - CARDIOLITE
32.1000 | Freq: Once | INTRAVENOUS | Status: AC | PRN
  Administered 2015-03-07: 32.1 via INTRAVENOUS

## 2015-03-07 MED ORDER — DOFETILIDE 125 MCG PO CAPS: 125.0000 ug | ORAL_CAPSULE | Freq: Two times a day (BID) | ORAL | Status: DC

## 2015-03-07 MED ORDER — TECHNETIUM TC 99M SESTAMIBI GENERIC - CARDIOLITE
10.2000 | Freq: Once | INTRAVENOUS | Status: AC | PRN
  Administered 2015-03-07: 10 via INTRAVENOUS

## 2015-03-07 MED ORDER — APIXABAN 2.5 MG PO TABS: 2.5000 mg | ORAL_TABLET | Freq: Two times a day (BID) | ORAL | Status: AC

## 2015-03-08 LAB — MYOCARDIAL PERFUSION IMAGING
CHL CUP NUCLEAR SRS: 21
CHL CUP NUCLEAR SSS: 26
CHL CUP RESTING HR STRESS: 61 {beats}/min
LVDIAVOL: 66 mL
LVSYSVOL: 37 mL
NUC STRESS TID: 1.16
Peak HR: 79 {beats}/min
RATE: 0.19
SDS: 5

## 2015-03-10 ENCOUNTER — Other Ambulatory Visit (HOSPITAL_COMMUNITY): Payer: Medicare Other

## 2015-03-10 ENCOUNTER — Telehealth: Payer: Self-pay | Admitting: Cardiology

## 2015-03-10 ENCOUNTER — Telehealth (HOSPITAL_COMMUNITY): Payer: Self-pay | Admitting: Vascular Surgery

## 2015-03-10 NOTE — Telephone Encounter (Signed)
LMTBC for pt

## 2015-03-10 NOTE — Telephone Encounter (Signed)
New message      Talk to Webb Silversmith regarding getting her labs drawn in [place].  If it is ok, please fax order to [phone] Annye Asa.

## 2015-03-10 NOTE — Telephone Encounter (Signed)
PT called she canceled her lab appt and 03/31/15 appt w/ [facility], she does not want to come to the [facility] clinic anymore she wants to be a signed to a new Cardiologist at [place].. Please advise

## 2015-03-17 ENCOUNTER — Telehealth: Payer: Self-pay

## 2015-03-17 NOTE — Telephone Encounter (Signed)
Called patient to tell her that her Cynthia Hicks has arrived and can be picked up at the front desk.  Patient states that she is presently in [facility].

## 2015-03-21 NOTE — Telephone Encounter (Signed)
03/17/15 phone note indicates pt inpatient at [facility].

## 2015-03-22 NOTE — Progress Notes (Signed)
Call to Dr. Ardis Hughs office spoke with Chong Sicilian CMA to report that I spoke with Mrs. Trawick yesterday; she stated her son was to call and cancel her procedure for 03-24-15 because she is in the [facility] and has been for the past 11 days.

## 2015-03-24 ENCOUNTER — Ambulatory Visit (HOSPITAL_COMMUNITY): Admission: RE | Admit: 2015-03-24 | Payer: Medicare Other | Source: Ambulatory Visit | Admitting: Gastroenterology

## 2015-03-24 ENCOUNTER — Encounter (HOSPITAL_COMMUNITY): Admission: RE | Payer: Self-pay | Source: Ambulatory Visit

## 2015-03-24 SURGERY — ESOPHAGOGASTRODUODENOSCOPY (EGD) WITH PROPOFOL
Anesthesia: Monitor Anesthesia Care

## 2015-03-31 ENCOUNTER — Encounter (HOSPITAL_COMMUNITY): Payer: Medicare Other

## 2015-05-03 ENCOUNTER — Telehealth: Payer: Self-pay | Admitting: Cardiology

## 2015-05-03 NOTE — Telephone Encounter (Signed)
Request for surgical clearance:  1. What type of surgery is being performed? Gal bladder   2. When is this surgery scheduled? 05/04/2015  Patient c/o Afib:  High priority if patient c/o lightheadedness and shortness of breath.  1. How long have you been having afib? For 4-5. Heart Rate has been running from 98-130.   2. Are you currently experiencing lightheadedness and shortness of breath? Not SOB but she is weak. Lost about 20lbs in the last month   4. Are you experiencing any other symptoms? No   Comments: Pt states that she was cleared for Surgery a while back however within the last 5 days she has had Afib. Request a call back to determine if she should still have the surgery.

## 2015-05-03 NOTE — Telephone Encounter (Signed)
Pt states that she may be in a-fib her HR is 90 beats/minute this morning taken by the visiting nurse with a special monitor. Pt has a history of Paroxsymal Afib. Pt  denies any other  symptoms except for weakness pt states there are some other problems that is going on with her . Pt had an ct scan by her GI doctor. Pt states that she is scheduled for a Gal bladder surgery tomorrow and wants to know if she can have the procedure if she is in A-fib. Pt is aware to call her GI doctor and let him know and see what he said. Pt is taking Eliquis and Tikosyn 125 mg twice a day. Pt has an appointment with Dr. Aundra Dubin on this coming Friday 05/06/15 at 1:15 PM pt is aware. Pt is to call if need.

## 2015-05-06 ENCOUNTER — Ambulatory Visit (INDEPENDENT_AMBULATORY_CARE_PROVIDER_SITE_OTHER): Payer: Medicare Other | Admitting: Cardiology

## 2015-05-06 ENCOUNTER — Encounter: Payer: Self-pay | Admitting: Cardiology

## 2015-05-06 VITALS — BP 132/68 | HR 92 | Ht 63.0 in | Wt 114.0 lb

## 2015-05-06 DIAGNOSIS — I4819 Other persistent atrial fibrillation: Secondary | ICD-10-CM

## 2015-05-06 DIAGNOSIS — N183 Chronic kidney disease, stage 3 unspecified: Secondary | ICD-10-CM

## 2015-05-06 DIAGNOSIS — I481 Persistent atrial fibrillation: Secondary | ICD-10-CM | POA: Diagnosis not present

## 2015-05-06 DIAGNOSIS — I5022 Chronic systolic (congestive) heart failure: Secondary | ICD-10-CM | POA: Diagnosis not present

## 2015-05-06 DIAGNOSIS — I5032 Chronic diastolic (congestive) heart failure: Secondary | ICD-10-CM

## 2015-05-06 DIAGNOSIS — I2510 Atherosclerotic heart disease of native coronary artery without angina pectoris: Secondary | ICD-10-CM | POA: Diagnosis not present

## 2015-05-06 LAB — CBC WITH DIFFERENTIAL/PLATELET
Basophils Absolute: 0 10*3/uL (ref 0.0–0.1)
Basophils Relative: 0.3 % (ref 0.0–3.0)
EOS PCT: 0 % (ref 0.0–5.0)
Eosinophils Absolute: 0 10*3/uL (ref 0.0–0.7)
HEMATOCRIT: 36.9 % (ref 36.0–46.0)
HEMOGLOBIN: 11.6 g/dL — AB (ref 12.0–15.0)
LYMPHS PCT: 13.5 % (ref 12.0–46.0)
Lymphs Abs: 1.2 10*3/uL (ref 0.7–4.0)
MCHC: 31.3 g/dL (ref 30.0–36.0)
MCV: 96.7 fl (ref 78.0–100.0)
MONO ABS: 0.5 10*3/uL (ref 0.1–1.0)
Monocytes Relative: 5.8 % (ref 3.0–12.0)
Neutro Abs: 7.4 10*3/uL (ref 1.4–7.7)
Neutrophils Relative %: 80.4 % — ABNORMAL HIGH (ref 43.0–77.0)
PLATELETS: 236 10*3/uL (ref 150.0–400.0)
RBC: 3.82 Mil/uL — ABNORMAL LOW (ref 3.87–5.11)
RDW: 23.1 % — ABNORMAL HIGH (ref 11.5–15.5)
WBC: 9.2 10*3/uL (ref 4.0–10.5)

## 2015-05-06 LAB — BRAIN NATRIURETIC PEPTIDE: Pro B Natriuretic peptide (BNP): 180 pg/mL — ABNORMAL HIGH (ref 0.0–100.0)

## 2015-05-06 LAB — BASIC METABOLIC PANEL
BUN: 35 mg/dL — ABNORMAL HIGH (ref 6–23)
CO2: 33 meq/L — AB (ref 19–32)
CREATININE: 1.9 mg/dL — AB (ref 0.40–1.20)
Calcium: 9.3 mg/dL (ref 8.4–10.5)
Chloride: 98 mEq/L (ref 96–112)
GFR: 27.05 mL/min — ABNORMAL LOW (ref >60.00)
Glucose, Bld: 240 mg/dL — ABNORMAL HIGH (ref 70–99)
Potassium: 5.6 mEq/L — ABNORMAL HIGH (ref 3.5–5.1)
Sodium: 137 mEq/L (ref 135–145)

## 2015-05-06 MED ORDER — BISOPROLOL FUMARATE 5 MG PO TABS: 5.0000 mg | ORAL_TABLET | Freq: Every day | ORAL | Status: DC

## 2015-05-06 NOTE — Patient Instructions (Addendum)
Medication Instructions:  Bisoprolol 5mg  daily has been refilled.   Labwork: BMET/BNP/CBCd today  Testing/Procedures: Your physician has recommended that you wear an event monitor. Event monitors are medical devices that record the heart's electrical activity. Doctors most often [place] these monitors to diagnose arrhythmias. Arrhythmias are problems with the speed or rhythm of the heartbeat. The monitor is a small, portable device. You can wear one while you do your normal daily activities. This is usually used to diagnose what is causing palpitations/syncope (passing out). [place]    Follow-Up: Your physician recommends that you schedule a follow-up appointment in: 2 months with Dr Aundra Dubin in the [facility] [place] have been referred to Dr Donne Hazel at [facility].

## 2015-05-08 NOTE — Progress Notes (Signed)
Patient ID: Cynthia Hicks, female   DOB: 1932-12-04, 81 y.o.   MRN: [id number] PCP: Dr. Celedonio Miyamoto General Surgeon:  Dr Abran Cantor [phone]  Primary HF: Dr Aundra Dubin.   Cynthia Hicks is a 81 yo woman with a history of PAF, diastolic HF, PAD, CAD and autoimmune hepatitis presents for cardiology followup.    She was admitted in 1/13 with NSTEMI.  Left heart cath showed 80% mid LAD stenosis and 99% mid CFX stenosis (likely culprit).  EF was 40-45% by echo and LV-gram.  She had a PROMUS DES to the CFX.  No intervention to the LAD.   Lexiscan myoview done subsequently in 2/13 showed EF 49% with no ischemia.  Echo 4/13 showed EF around 45-50% with basal to mid inferior and mid-apical posterior akinesis along with mild MR.  She was admitted in 12/13 at [facility] with severe cellulitis and sepsis syndrome.  She was seen here by Truitt Merle later in 12/13 and found to be in atrial fibrillation.  Coumadin was started initially, but now she is on Eliquis. In 3/14, she was cardioverted.  In 7/14, she had an echo showing EF 55-60% with basal inferior hypokinesis and a Lexiscan Cardiolite that was low risk with no ischemia.  She went back into atrial fibrillation in 11/15 but was back in NSR when she showed up for DCCV.  She had to be admitted at that time in 11/15 for PNA.  TEE was done in 11/15 during the PNA admission showing EF 55-60%, mild MR. She was also found to have significant PAD during 11/15 hospitalization.  She was back in atrial fibrillation at her 2/16 appt. In 3/16, she was admitted for Tikosyn and converted back to NSR.  She is in NSR today.  She had a Cardiolite in 8/16 that showed EF 44% with fixed inferior and inferolateral defect, no ischemia.  She was planned for cholecystectomy but ended up being admitted to the hospital in [place] in 8/16 for 12 days with sepsis and PNA, and surgery was put off.    She remains very limited.  Quite weak, walking with a walker.  No dyspnea walking around her house but does  not get out much.  No chest pain.  She takes Lasix once a day and metolazone once a week.  Weight is down 17 lbs since last appointment.  She will occasionally feel her heart racing with the rate increasing up to the 130s at times.  Of note, she has been off of her bisoprolol since last hospitalization.  She still has some right upper quadrant pain, not clearly related to eating.    Labs (4/13): LDL 65, HDL 101, K 3.7, creatinine 1.46, AST 26, ALT 38 Labs (5/13): K 4.4, creatinine 1.54 Labs (10/13): LDL 100, HDL 87 Labs (1/14): K 4.3, creatinine 1.76 Labs (2/14): LDL 65, HDL 109, TGs 111 Labs (4/14): creatinine 1.4 => 1.47, K 4.1, BNP 381 Labs (5/14): K 4.6, creatinine 1.7 Labs (10/14): LDL 68, HDL 80 Labs (6/15): K 4.1, creatinine 1.18 Labs (9/15): HCT 35.2, K 4.3, creatinine 1.9 Labs (11/15): K 5, creatinine 1.38, AST 23, ALT 16 Labs (3/16): K 4.4, creatinine 1.56, LDL 73 Labs 01/27/15: K 4.9 Creatinine 1.64 Hgb 11.4  Labs (7/16): K 4.3, creatinine 1.75  ECG: NSR, LAFB, QTc 455  PMH: 1. CAD: NSTEMI 1/07.  LHC showed 80-90% mid LAD stenosis and 95% mid RCA stenosis.  She had Taxus DES x 2 (overlapping) to the RCA.  Procedure was complicated by wire  perforation of PLV, PLV was coil embolized.  Myoview 2009 showed no ischemia or infarction.  NSTEMI 1/13.  LHC with 80% mid LAD (same as 2007), 99% mid CFX, 70% distal RCA.  PROMUS DES to CFX.  Lexiscan myoview (2/13, to assess LAD territory): EF 49%, no ischemia.  Lexiscan Cardiolite (7/14) with EF 48%, no ischemia. Cardiolite 8/16 with EF 44%, no ischemia, fixed inferior/inferolateral defect.  2. Hyperlipidemia: Unable to take Crestor or Lipitor due to leg weakness.  3. Autoimmune hepatitis: Followed at [facility].  On prednisone and sirolimus.   4. Lumbar disc disease/sciatica.  5. CKD 6. Chronic venous insufficiency with chronic R>L lower leg swelling.  7. PAD: Arterial dopplers (11/15) with tandom significant stenoses in the left mid femoral  artery, significant stenosis right mid femoral artery.   8. SVT: suppressed by beta blocker.  9. Ischemic cardiomyopathy: Echo (1/13) with EF 40-45%.  EF 40% by LV-gram (1/13).  Echo (4/13): EF 45-50% with basal to mid inferior and mid to apical posterior akinesis and moderate MR.  Echo (7/14) with EF 55-60%, basal inferior HK, mild LVH.  TEE (11/15) with EF 55-60%, normal RV size and systolic function, mild MR, no LAA thrombus.  10. Carotid dopplers (12/12) with minimal disease.  11. Cholelithiasis/choledocholithiasis s/p ERCP and sphincterotomy in 2013.  12. Atrial fibrillation: First noted in 12/13.  DCCV 3/14. Back in atrial fibrillation in 9/15. She was admitted in 3/16 for dofetilide initiation.  She went back into NSR.  13. Frozen shoulder on right.   SH: Lives in [place], widowed, retired Engineer, site.  Never smoked.    FH: No premature CAD.    ROS: All systems reviewed and negative except as per HPI.   Current Outpatient Prescriptions  Medication Sig Dispense Refill  . Alendronate Sodium (FOSAMAX PO) Take 1 tablet by mouth once a week. Monday    . apixaban (ELIQUIS) 2.5 MG TABS tablet Take 1 tablet (2.5 mg total) by mouth 2 (two) times daily. 180 tablet 3  . Ascorbic Acid (VITAMIN C) 1000 MG tablet Take 1,000 mg by mouth 2 (two) times daily.     . bisoprolol (ZEBETA) 5 MG tablet Take 1 tablet (5 mg total) by mouth daily. 90 tablet 3  . calcium carbonate (OS-CAL) 600 MG TABS tablet Take 600 mg by mouth 2 (two) times daily with a meal.    . Cholecalciferol (VITAMIN D PO) Take 2,000 Units by mouth at bedtime.     . Chromium 500 MCG TABS Take 1 tablet by mouth daily.      . Coenzyme Q-10 100 MG capsule Take 100 mg by mouth 2 (two) times daily.    Marland Kitchen dofetilide (TIKOSYN) 125 MCG capsule Take 1 capsule (125 mcg total) by mouth 2 (two) times daily. 180 capsule 3  . ezetimibe (ZETIA) 10 MG tablet Take 10 mg by mouth daily.      . fish oil-omega-3 fatty acids 1000 MG capsule Take 1 g  by mouth 2 (two) times daily.     . furosemide (LASIX) 40 MG tablet Take 40 mg by mouth daily.    Marland Kitchen gabapentin (NEURONTIN) 100 MG capsule Take 100 mg by mouth 3 (three) times daily.    Marland Kitchen losartan (COZAAR) 25 MG tablet Take 25 mg by mouth daily.  3  . magnesium oxide (MAG-OX) 400 (241.3 MG) MG tablet Take 1 tablet (400 mg total) by mouth daily. 30 tablet 11  . morphine (MS CONTIN) 60 MG 12 hr tablet Take 1  tablet by mouth 2 (two) times daily.    . Multiple Vitamin (MULTIVITAMIN) capsule Take 1 capsule by mouth daily.      . nitroGLYCERIN (NITROSTAT) 0.4 MG SL tablet Place 1 tablet (0.4 mg total) under the tongue every 5 (five) minutes as needed for chest pain. 25 tablet 3  . omeprazole (PRILOSEC) 20 MG capsule Take 20 mg by mouth daily.     Marland Kitchen oxyCODONE (OXY IR/ROXICODONE) 5 MG immediate release tablet Take 5 mg by mouth 2 (two) times daily as needed for moderate pain.   0  . pravastatin (PRAVACHOL) 80 MG tablet Take 1 tablet (80 mg total) by mouth every evening. 90 tablet 3  . predniSONE (DELTASONE) 10 MG tablet Take 10 mg by mouth daily.      . Probiotic Product (PROBIOTIC DAILY PO) Take 1 tablet by mouth daily.     Marland Kitchen Propylene Glycol (SYSTANE BALANCE OP) Place 1 drop into the left eye daily.    . Selenium 200 MCG CAPS Take 1 capsule by mouth daily.      Marland Kitchen sulfamethoxazole-trimethoprim (BACTRIM DS,SEPTRA DS) 800-160 MG tablet TK 1 T PO BID FOR 10 DAYS  0   No current facility-administered medications for this visit.   BP 132/68 mmHg  Pulse 92  Ht 5\' 3"  (1.6 m)  Wt 114 lb (51.71 kg)  BMI 20.20 kg/m2 General: NAD. Elderly kyphotic. Neck: JVP 7 cm, no thyromegaly or thyroid nodule.  Lungs: Clear to auscultation bilaterally with normal respiratory effort. CV: Nondisplaced PMI.  Heart regular S1/S2, no S3/S4, no murmur.  1+ edema to knees bilaterally.  I cannot palpate pedal pulses.  Abdomen: Soft, nontender, no hepatosplenomegaly, no distention.   Neurologic: Alert and oriented x 3.  Psych:  Normal affect. Extremities: No clubbing or cyanosis. Venous stasis changes lower legs bilaterally.   Assessment/Plan:  1) Chronic primarily diastolic CHF EF 53% on 6/14 Cardiolite.  Suspect ischemic cardiomyopathy.   NYHA class III symptoms. Volume status hard to assess due to lymphedema but do not think that she has significant volume overload with no JVD.  - Continue lasix  40 mg daily.  Weight down a lot, think she can stop metolazone.  BMET/BNP today.  - Continue lymphedema boots 2) Chronic kidney disease  Relatively stable.  Repeat BMET today.    3) Coronary artery disease  NSTEMI with Promus DES to CFX in 1/13. Has residual 80% mid LAD stenosis. This was also seen on 2007 cath, and Lexiscan myoview in 7/14 showed no ischemia.  No chest pain.  Repeat Lexiscan Cardiolite prior to possible cholecystectomy in 8/16 showed EF 44% with no ischemia, there was was a fixed inferior/inferolateral perfusion defect.  - She is anticoagulated with apixaban.  Stable CAD, so not on ASA.  - She will continue ARB and statin.  4) HYPERLIPIDEMIA Good lipids in 3/16.  5) Paroxysmal Atrial fibrillation She is in NSR on dofetilide, QTc is ok.  Continue dofetilide and apixaban.  CBC today.  She reports that she feels her heart race.  Her HR is higher now that she is off bisoprolol.  I will have her restart bisoprolol 5 mg daily.  2 week event monitor to make sure she is not having frequent atrial fibrillation runs.  6) PAD She has followup with [facility] (Dr Scot Dock).  7) Symptomatic cholelithiasis: She still has RUQ pain.  I think it would be best that she have any surgery at [facility].  I will refer her to [facility].  8) Chronic Lower Extremity Lymphedema: Using lymph edema machine twice a day.   [name] 05/08/2015

## 2015-05-11 ENCOUNTER — Telehealth: Payer: Self-pay | Admitting: Cardiology

## 2015-05-11 NOTE — Telephone Encounter (Signed)
Instructed patient to keep her appointment - that Dr. Aundra Dubin wants to make sure she is not having any atrial fibrillation. Patient agrees with treatment plan.

## 2015-05-11 NOTE — Telephone Encounter (Signed)
New message      Pt is due to come in Friday to get a monitor.  Her HR was high.  Pt took a bisoprolol Friday evening and now her HR is normal (84 this am).  Should she still come in for the monitor?

## 2015-05-13 ENCOUNTER — Ambulatory Visit (INDEPENDENT_AMBULATORY_CARE_PROVIDER_SITE_OTHER): Payer: Medicare Other

## 2015-05-13 ENCOUNTER — Other Ambulatory Visit (INDEPENDENT_AMBULATORY_CARE_PROVIDER_SITE_OTHER): Payer: Medicare Other | Admitting: *Deleted

## 2015-05-13 DIAGNOSIS — N183 Chronic kidney disease, stage 3 unspecified: Secondary | ICD-10-CM

## 2015-05-13 DIAGNOSIS — I481 Persistent atrial fibrillation: Secondary | ICD-10-CM | POA: Diagnosis not present

## 2015-05-13 DIAGNOSIS — I5022 Chronic systolic (congestive) heart failure: Secondary | ICD-10-CM | POA: Diagnosis not present

## 2015-05-13 DIAGNOSIS — I2510 Atherosclerotic heart disease of native coronary artery without angina pectoris: Secondary | ICD-10-CM | POA: Diagnosis not present

## 2015-05-13 DIAGNOSIS — I4819 Other persistent atrial fibrillation: Secondary | ICD-10-CM

## 2015-05-13 LAB — BASIC METABOLIC PANEL
BUN: 40 mg/dL — AB (ref 7–25)
CHLORIDE: 101 mmol/L (ref 98–110)
CO2: 29 mmol/L (ref 20–31)
Calcium: 8.7 mg/dL (ref 8.6–10.4)
Creat: 1.3 mg/dL — ABNORMAL HIGH (ref 0.60–0.93)
Glucose, Bld: 263 mg/dL — ABNORMAL HIGH (ref 65–99)
POTASSIUM: 3.8 mmol/L (ref 3.5–5.3)
Sodium: 141 mmol/L (ref 135–146)

## 2015-05-13 NOTE — Addendum Note (Signed)
Addended by: Eulis Foster on: 05/13/2015 11:16 AM   Modules accepted: Orders

## 2015-06-03 ENCOUNTER — Telehealth: Payer: Self-pay | Admitting: Cardiology

## 2015-06-03 NOTE — Telephone Encounter (Signed)
Webb Silversmith, can you see what happened?

## 2015-06-03 NOTE — Telephone Encounter (Signed)
Pt states she was unable to make appt with Dr Donne Hazel, pt requesting to reschedule.

## 2015-06-03 NOTE — Telephone Encounter (Signed)
Santiago Glad at Dr Cristal Generous office is going to contact pt to reschedule the appt with Dr Donne Hazel.

## 2015-06-03 NOTE — Telephone Encounter (Signed)
New message    Dr. Donne Hazel office calling patient had appt on 10.24.2016 was a nos.

## 2015-06-06 ENCOUNTER — Telehealth: Payer: Self-pay | Admitting: Cardiology

## 2015-06-06 NOTE — Telephone Encounter (Signed)
Spoke with patient about recent monitor results.

## 2015-06-06 NOTE — Telephone Encounter (Signed)
New message      Returned Anne's call

## 2015-06-08 ENCOUNTER — Inpatient Hospital Stay (HOSPITAL_COMMUNITY)
Admission: EM | Admit: 2015-06-08 | Discharge: 2015-06-13 | DRG: 872 | Disposition: A | Discharge status: Skilled Nursing Facility | Payer: Medicare Other | Attending: Infectious Diseases | Admitting: Infectious Diseases

## 2015-06-08 ENCOUNTER — Encounter (HOSPITAL_COMMUNITY): Payer: Self-pay | Admitting: *Deleted

## 2015-06-08 ENCOUNTER — Emergency Department (HOSPITAL_COMMUNITY): Payer: Medicare Other

## 2015-06-08 DIAGNOSIS — M1990 Unspecified osteoarthritis, unspecified site: Secondary | ICD-10-CM | POA: Diagnosis present

## 2015-06-08 DIAGNOSIS — L89101 Pressure ulcer of unspecified part of back, stage 1: Secondary | ICD-10-CM | POA: Diagnosis present

## 2015-06-08 DIAGNOSIS — Z955 Presence of coronary angioplasty implant and graft: Secondary | ICD-10-CM

## 2015-06-08 DIAGNOSIS — E2740 Unspecified adrenocortical insufficiency: Secondary | ICD-10-CM | POA: Diagnosis present

## 2015-06-08 DIAGNOSIS — Z7952 Long term (current) use of systemic steroids: Secondary | ICD-10-CM

## 2015-06-08 DIAGNOSIS — M40209 Unspecified kyphosis, site unspecified: Secondary | ICD-10-CM | POA: Diagnosis present

## 2015-06-08 DIAGNOSIS — K8020 Calculus of gallbladder without cholecystitis without obstruction: Secondary | ICD-10-CM | POA: Diagnosis present

## 2015-06-08 DIAGNOSIS — N183 Chronic kidney disease, stage 3 unspecified: Secondary | ICD-10-CM | POA: Diagnosis present

## 2015-06-08 DIAGNOSIS — A4102 Sepsis due to Methicillin resistant Staphylococcus aureus: Principal | ICD-10-CM | POA: Diagnosis present

## 2015-06-08 DIAGNOSIS — E1140 Type 2 diabetes mellitus with diabetic neuropathy, unspecified: Secondary | ICD-10-CM | POA: Diagnosis present

## 2015-06-08 DIAGNOSIS — A419 Sepsis, unspecified organism: Secondary | ICD-10-CM | POA: Diagnosis present

## 2015-06-08 DIAGNOSIS — I5032 Chronic diastolic (congestive) heart failure: Secondary | ICD-10-CM | POA: Diagnosis present

## 2015-06-08 DIAGNOSIS — I480 Paroxysmal atrial fibrillation: Secondary | ICD-10-CM | POA: Diagnosis present

## 2015-06-08 DIAGNOSIS — I130 Hypertensive heart and chronic kidney disease with heart failure and stage 1 through stage 4 chronic kidney disease, or unspecified chronic kidney disease: Secondary | ICD-10-CM | POA: Diagnosis present

## 2015-06-08 DIAGNOSIS — G8929 Other chronic pain: Secondary | ICD-10-CM | POA: Diagnosis present

## 2015-06-08 DIAGNOSIS — Z85820 Personal history of malignant melanoma of skin: Secondary | ICD-10-CM

## 2015-06-08 DIAGNOSIS — E785 Hyperlipidemia, unspecified: Secondary | ICD-10-CM | POA: Diagnosis present

## 2015-06-08 DIAGNOSIS — M25519 Pain in unspecified shoulder: Secondary | ICD-10-CM | POA: Diagnosis present

## 2015-06-08 DIAGNOSIS — E119 Type 2 diabetes mellitus without complications: Secondary | ICD-10-CM

## 2015-06-08 DIAGNOSIS — K754 Autoimmune hepatitis: Secondary | ICD-10-CM | POA: Diagnosis present

## 2015-06-08 DIAGNOSIS — R05 Cough: Secondary | ICD-10-CM

## 2015-06-08 DIAGNOSIS — I252 Old myocardial infarction: Secondary | ICD-10-CM | POA: Diagnosis not present

## 2015-06-08 DIAGNOSIS — R509 Fever, unspecified: Secondary | ICD-10-CM

## 2015-06-08 DIAGNOSIS — B9561 Methicillin susceptible Staphylococcus aureus infection as the cause of diseases classified elsewhere: Secondary | ICD-10-CM | POA: Diagnosis not present

## 2015-06-08 DIAGNOSIS — Z8719 Personal history of other diseases of the digestive system: Secondary | ICD-10-CM

## 2015-06-08 DIAGNOSIS — I255 Ischemic cardiomyopathy: Secondary | ICD-10-CM | POA: Diagnosis present

## 2015-06-08 DIAGNOSIS — R058 Other specified cough: Secondary | ICD-10-CM

## 2015-06-08 DIAGNOSIS — Z8619 Personal history of other infectious and parasitic diseases: Secondary | ICD-10-CM | POA: Diagnosis not present

## 2015-06-08 DIAGNOSIS — D638 Anemia in other chronic diseases classified elsewhere: Secondary | ICD-10-CM | POA: Diagnosis present

## 2015-06-08 DIAGNOSIS — Z794 Long term (current) use of insulin: Secondary | ICD-10-CM | POA: Diagnosis not present

## 2015-06-08 DIAGNOSIS — Z79891 Long term (current) use of opiate analgesic: Secondary | ICD-10-CM | POA: Diagnosis not present

## 2015-06-08 DIAGNOSIS — S81802A Unspecified open wound, left lower leg, initial encounter: Secondary | ICD-10-CM

## 2015-06-08 DIAGNOSIS — I878 Other specified disorders of veins: Secondary | ICD-10-CM | POA: Diagnosis present

## 2015-06-08 DIAGNOSIS — I2510 Atherosclerotic heart disease of native coronary artery without angina pectoris: Secondary | ICD-10-CM | POA: Diagnosis present

## 2015-06-08 DIAGNOSIS — M545 Low back pain: Secondary | ICD-10-CM | POA: Diagnosis present

## 2015-06-08 DIAGNOSIS — B9689 Other specified bacterial agents as the cause of diseases classified elsewhere: Secondary | ICD-10-CM | POA: Diagnosis not present

## 2015-06-08 DIAGNOSIS — L89152 Pressure ulcer of sacral region, stage 2: Secondary | ICD-10-CM | POA: Diagnosis present

## 2015-06-08 DIAGNOSIS — I471 Supraventricular tachycardia: Secondary | ICD-10-CM | POA: Diagnosis present

## 2015-06-08 DIAGNOSIS — Z452 Encounter for adjustment and management of vascular access device: Secondary | ICD-10-CM | POA: Insufficient documentation

## 2015-06-08 DIAGNOSIS — I10 Essential (primary) hypertension: Secondary | ICD-10-CM | POA: Diagnosis present

## 2015-06-08 DIAGNOSIS — L03116 Cellulitis of left lower limb: Secondary | ICD-10-CM | POA: Diagnosis present

## 2015-06-08 LAB — URINALYSIS, ROUTINE W REFLEX MICROSCOPIC
BILIRUBIN URINE: NEGATIVE
GLUCOSE, UA: 100 mg/dL — AB
HGB URINE DIPSTICK: NEGATIVE
KETONES UR: NEGATIVE mg/dL
Leukocytes, UA: NEGATIVE
Nitrite: NEGATIVE
PROTEIN: NEGATIVE mg/dL
SPECIFIC GRAVITY, URINE: 1.012 (ref 1.005–1.030)
Urobilinogen, UA: 0.2 mg/dL (ref 0.0–1.0)
pH: 7 (ref 5.0–8.0)

## 2015-06-08 LAB — COMPREHENSIVE METABOLIC PANEL
ALK PHOS: 83 U/L (ref 38–126)
ALT: 13 U/L — AB (ref 14–54)
AST: 24 U/L (ref 15–41)
Albumin: 2.4 g/dL — ABNORMAL LOW (ref 3.5–5.0)
Anion gap: 15 (ref 5–15)
BILIRUBIN TOTAL: 0.7 mg/dL (ref 0.3–1.2)
BUN: 13 mg/dL (ref 6–20)
CALCIUM: 9.3 mg/dL (ref 8.9–10.3)
CO2: 28 mmol/L (ref 22–32)
CREATININE: 1.23 mg/dL — AB (ref 0.44–1.00)
Chloride: 98 mmol/L — ABNORMAL LOW (ref 101–111)
GFR, EST AFRICAN AMERICAN: 47 mL/min — AB (ref >60)
GFR, EST NON AFRICAN AMERICAN: 41 mL/min — AB (ref >60)
Glucose, Bld: 221 mg/dL — ABNORMAL HIGH (ref 65–99)
Potassium: 3.6 mmol/L (ref 3.5–5.1)
Sodium: 141 mmol/L (ref 135–145)
TOTAL PROTEIN: 5.9 g/dL — AB (ref 6.5–8.1)

## 2015-06-08 LAB — CBC WITH DIFFERENTIAL/PLATELET
BASOS ABS: 0 10*3/uL (ref 0.0–0.1)
BASOS PCT: 0 %
EOS ABS: 0 10*3/uL (ref 0.0–0.7)
Eosinophils Relative: 0 %
HCT: 33.8 % — ABNORMAL LOW (ref 36.0–46.0)
HEMOGLOBIN: 10.6 g/dL — AB (ref 12.0–15.0)
Lymphocytes Relative: 12 %
Lymphs Abs: 2.8 10*3/uL (ref 0.7–4.0)
MCH: 29.8 pg (ref 26.0–34.0)
MCHC: 31.4 g/dL (ref 30.0–36.0)
MCV: 94.9 fL (ref 78.0–100.0)
MONOS PCT: 6 %
Monocytes Absolute: 1.5 10*3/uL — ABNORMAL HIGH (ref 0.1–1.0)
NEUTROS PCT: 82 %
Neutro Abs: 19.1 10*3/uL — ABNORMAL HIGH (ref 1.7–7.7)
Platelets: 318 10*3/uL (ref 150–400)
RBC: 3.56 MIL/uL — AB (ref 3.87–5.11)
RDW: 19.2 % — ABNORMAL HIGH (ref 11.5–15.5)
WBC: 23.5 10*3/uL — AB (ref 4.0–10.5)

## 2015-06-08 LAB — I-STAT CG4 LACTIC ACID, ED
LACTIC ACID, VENOUS: 2.26 mmol/L — AB (ref 0.5–2.0)
LACTIC ACID, VENOUS: 1.2 mmol/L (ref 0.5–2.0)

## 2015-06-08 LAB — I-STAT ARTERIAL BLOOD GAS, ED
ACID-BASE EXCESS: 6 mmol/L — AB (ref 0.0–2.0)
BICARBONATE: 27.9 meq/L — AB (ref 20.0–24.0)
O2 SAT: 99 %
TCO2: 29 mmol/L (ref 0–100)
pCO2 arterial: 33.1 mmHg — ABNORMAL LOW (ref 35.0–45.0)
pH, Arterial: 7.541 — ABNORMAL HIGH (ref 7.350–7.450)
pO2, Arterial: 115 mmHg — ABNORMAL HIGH (ref 80.0–100.0)

## 2015-06-08 LAB — C-REACTIVE PROTEIN: CRP: 11.5 mg/dL — ABNORMAL HIGH (ref <1.0)

## 2015-06-08 LAB — MRSA PCR SCREENING: MRSA BY PCR: NEGATIVE

## 2015-06-08 LAB — SEDIMENTATION RATE: Sed Rate: 88 mm/hr — ABNORMAL HIGH (ref 0–22)

## 2015-06-08 MED ORDER — ACETAMINOPHEN 650 MG RE SUPP: 650.0000 mg | Freq: Four times a day (QID) | RECTAL | Status: DC | PRN

## 2015-06-08 MED ORDER — ACETAMINOPHEN 650 MG RE SUPP: 650.0000 mg | RECTAL | Status: DC | PRN

## 2015-06-08 MED ORDER — ACETAMINOPHEN 325 MG PO TABS: 650.0000 mg | ORAL_TABLET | Freq: Four times a day (QID) | ORAL | Status: DC | PRN

## 2015-06-08 MED ORDER — OXYCODONE HCL 5 MG PO TABS
5.0000 mg | ORAL_TABLET | Freq: Two times a day (BID) | ORAL | Status: DC | PRN
  Administered 2015-06-10 – 2015-06-13 (×5): 5 mg via ORAL
  Filled 2015-06-08 (×5): qty 1

## 2015-06-08 MED ORDER — SODIUM CHLORIDE 0.9 % IJ SOLN
3.0000 mL | Freq: Two times a day (BID) | INTRAMUSCULAR | Status: DC
  Administered 2015-06-09 – 2015-06-10 (×5): 3 mL via INTRAVENOUS

## 2015-06-08 MED ORDER — INSULIN ASPART 100 UNIT/ML ~~LOC~~ SOLN
0.0000 [IU] | SUBCUTANEOUS | Status: DC
  Administered 2015-06-09: 5 [IU] via SUBCUTANEOUS
  Administered 2015-06-09: 8 [IU] via SUBCUTANEOUS
  Administered 2015-06-09 (×2): 3 [IU] via SUBCUTANEOUS
  Administered 2015-06-09 – 2015-06-10 (×2): 5 [IU] via SUBCUTANEOUS
  Administered 2015-06-10: 3 [IU] via SUBCUTANEOUS
  Administered 2015-06-10: 0 [IU] via SUBCUTANEOUS
  Administered 2015-06-10: 2 [IU] via SUBCUTANEOUS
  Administered 2015-06-10: 11 [IU] via SUBCUTANEOUS

## 2015-06-08 MED ORDER — FENTANYL CITRATE (PF) 100 MCG/2ML IJ SOLN: 25.0000 ug | Freq: Once | INTRAMUSCULAR | Status: DC

## 2015-06-08 MED ORDER — ENSURE ENLIVE PO LIQD: 237.0000 mL | Freq: Two times a day (BID) | ORAL | Status: DC

## 2015-06-08 MED ORDER — SODIUM CHLORIDE 0.9 % IV BOLUS (SEPSIS)
1000.0000 mL | Freq: Once | INTRAVENOUS | Status: AC
  Administered 2015-06-08: 1000 mL via INTRAVENOUS

## 2015-06-08 MED ORDER — PIPERACILLIN-TAZOBACTAM 3.375 G IVPB 30 MIN
3.3750 g | Freq: Once | INTRAVENOUS | Status: AC
  Administered 2015-06-08: 3.375 g via INTRAVENOUS
  Filled 2015-06-08: qty 50

## 2015-06-08 MED ORDER — DOFETILIDE 125 MCG PO CAPS
125.0000 ug | ORAL_CAPSULE | Freq: Two times a day (BID) | ORAL | Status: DC
  Administered 2015-06-08 – 2015-06-13 (×10): 125 ug via ORAL
  Filled 2015-06-08 (×15): qty 1

## 2015-06-08 MED ORDER — PREDNISONE 10 MG PO TABS: 10.0000 mg | ORAL_TABLET | Freq: Every day | ORAL | Status: DC

## 2015-06-08 MED ORDER — SODIUM CHLORIDE 0.9 % IV BOLUS (SEPSIS)
500.0000 mL | Freq: Once | INTRAVENOUS | Status: AC
  Administered 2015-06-08: 500 mL via INTRAVENOUS

## 2015-06-08 MED ORDER — BISOPROLOL FUMARATE 5 MG PO TABS
5.0000 mg | ORAL_TABLET | Freq: Every day | ORAL | Status: DC
  Administered 2015-06-08: 5 mg via ORAL
  Filled 2015-06-08: qty 1

## 2015-06-08 MED ORDER — MORPHINE SULFATE ER 30 MG PO TBCR
30.0000 mg | EXTENDED_RELEASE_TABLET | Freq: Two times a day (BID) | ORAL | Status: DC
  Administered 2015-06-08 – 2015-06-13 (×10): 30 mg via ORAL
  Filled 2015-06-08 (×3): qty 2
  Filled 2015-06-08 (×2): qty 1
  Filled 2015-06-08: qty 2
  Filled 2015-06-08 (×2): qty 1
  Filled 2015-06-08 (×2): qty 2

## 2015-06-08 MED ORDER — ACETAMINOPHEN 325 MG PO TABS
650.0000 mg | ORAL_TABLET | Freq: Four times a day (QID) | ORAL | Status: DC | PRN
  Administered 2015-06-08 – 2015-06-11 (×2): 650 mg via ORAL
  Filled 2015-06-08 (×2): qty 2

## 2015-06-08 MED ORDER — APIXABAN 2.5 MG PO TABS
2.5000 mg | ORAL_TABLET | Freq: Two times a day (BID) | ORAL | Status: DC
  Administered 2015-06-08 – 2015-06-13 (×10): 2.5 mg via ORAL
  Filled 2015-06-08 (×10): qty 1

## 2015-06-08 MED ORDER — GABAPENTIN 100 MG PO CAPS
100.0000 mg | ORAL_CAPSULE | Freq: Three times a day (TID) | ORAL | Status: DC
  Administered 2015-06-08 – 2015-06-13 (×14): 100 mg via ORAL
  Filled 2015-06-08 (×14): qty 1

## 2015-06-08 MED ORDER — VANCOMYCIN HCL IN DEXTROSE 1-5 GM/200ML-% IV SOLN
1000.0000 mg | Freq: Once | INTRAVENOUS | Status: AC
  Administered 2015-06-08: 1000 mg via INTRAVENOUS
  Filled 2015-06-08: qty 200

## 2015-06-08 MED ORDER — LORAZEPAM 1 MG PO TABS: 1.0000 mg | ORAL_TABLET | ORAL | Status: DC | PRN

## 2015-06-08 MED ORDER — PREDNISONE 20 MG PO TABS
20.0000 mg | ORAL_TABLET | Freq: Every day | ORAL | Status: DC
  Administered 2015-06-09 – 2015-06-13 (×5): 20 mg via ORAL
  Filled 2015-06-08 (×6): qty 1

## 2015-06-08 NOTE — ED Notes (Signed)
Dr Estanislado Spire in room.

## 2015-06-08 NOTE — H&P (Signed)
Date: 06/08/2015               Patient Name:  Cynthia Hicks MRN: [id number]  DOB: Dec 10, 1932 Age / Sex: 81 y.o., female   PCP: Raelyn Number, MD         Medical Service: Internal Medicine Teaching Service         Attending Physician: Dr. Campbell Riches, MD    First Contact: Dr. Jule Ser, DO Pager: [phone]  Second Contact: Dr. Albin Felling, MD Pager: [phone]       After Hours (After 5p/  First Contact Pager: [phone]  weekends / holidays): Second Contact Pager: [phone]   Chief Complaint: Altered Mental Status  History of Present Illness:   Cynthia Hicks a 81 year old woman with a past medical history of atrial fibrillation on apixibanand dofetilide (CHA2DS2VASc 6), CAD (NSTEMI x2 '07, '13, s/p DES), autoimmune hepatitis (on prednisone), ischemic cardiomyopathy (11/15 TEE 55-65%) who presents with altered mental status from [facility]. Patient was lethargic during the encounter, so history was primarily obtained from son Cynthia Hicks and chart review; however, she was able to describe some her current symptoms. According to her patient transfer form, she had been admitted to [facility] from 10/25 to 10/31 for treatment of a MRSA cellulitis of her left foot. She had been discharged to [facility] on 10/31, where she had continued to receive ceftriaxone via PICC for this infection. A request was made to obtain records from [facility], and multiple calls were made to [facility] in attempts to obtain more history, blood culture results, and antibiotic sensitivities, but hitherto the latter has not responded. Her son, Cynthia Hicks, had said that he had visited her at [facility] last night and she appeared more tired and had been complaining of nausea and her "voice had changed." According to her transfer form, she had endorsed some bowel and bladder incontinence, and the minimal history we did obtain from Cynthia Hicks suggested that she had been moved to  the bedpan at her facility and "left there for hours."She reports that she has been coughing up some mucus, but her son reports she had not been complaining of any of this. She has some shoulder pain, but it is longstanding. He also says she had not complained of chest pain, shortness of breath, nausea, vomiting, diarrhea, constipation, or abdominal pain. With respect to management of her other medical issues, she is followed by Dr. Aundra Dubin for management of her Afib, and had been attempting to proceed to cholecystectomy; however, she has been unable to due to her multiple hospitalizations for other medical issues. She does have a PCP in [place], but is apparently not interested in seeing him anymore.  When EMS arrived at [facility], she had pinpoint pupils and was lethargic. Therefore, she was administered Narcan, and in the ED she was noted to be agitated and confused, believing it was 1960. In the ED, she was febrile (101.3-103.7), tachycardic (106-132), tachypneic (22-40), but normotensive and satting 98% on 2LNC. She had a leukocytosis to 23.5, and an initial lactic acid of 2.26 that decreased to 1.2 with 1.5 L NS boluses. She was noted to convert to SVT briefly into the 160s with a baseline rhythm of atrial fibrillation with RVR (130-140s), which improved with fluid boluses. Blood and urine cultures were obtained prior to administering vancomycin and Zosyn. A left foot X-ray demonstrated no osseous abnormality, just soft tissue swelling. Tibia/fibula X-ray showed no  osseous abnormality but evidence bone infarct.  Meds: Current Facility-Administered Medications  Medication Dose Route Frequency Provider Last Rate Last Dose  . apixaban (ELIQUIS) tablet 2.5 mg  2.5 mg Oral BID Lucious Groves, DO      . bisoprolol (ZEBETA) tablet 5 mg  5 mg Oral Daily Lucious Groves, DO      . dofetilide (TIKOSYN) capsule 125 mcg  125 mcg Oral BID Lucious Groves, DO      . fentaNYL (SUBLIMAZE) injection 25 mcg  25 mcg  Intravenous Once Hoyle Sauer, MD   25 mcg at 06/08/15 1920  . gabapentin (NEURONTIN) capsule 100 mg  100 mg Oral TID Lucious Groves, DO      . LORazepam (ATIVAN) tablet 1 mg  1 mg Oral Q4H PRN Lucious Groves, DO      . morphine (MS CONTIN) 12 hr tablet 30 mg  30 mg Oral BID Liberty Handy, MD      . oxyCODONE (Oxy IR/ROXICODONE) immediate release tablet 5 mg  5 mg Oral BID PRN Lucious Groves, DO      . predniSONE (DELTASONE) tablet 10 mg  10 mg Oral Daily Lucious Groves, DO      . sodium chloride 0.9 % injection 3 mL  3 mL Intravenous Q12H Lucious Groves, DO        Allergies: Allergies as of 06/08/2015 - Review Complete 06/08/2015  Allergen Reaction Noted  . Cyclosporine  06/18/2014  . Azathioprine  03/03/2014  . Mycophenolate Other (See Comments) 06/23/2014  . Other  03/03/2014  . Oysters [shellfish allergy] Nausea And Vomiting 10/19/2014   Past Medical History  Diagnosis Date  . Hypertension   . Arthritis   . Diabetes mellitus   . Coronary artery disease     a.  MI 01/2003;  b.  02/2006 NSTEMI - LAD 80-63m, LCX nl, RCA 80m - RCA stented w/ Taxus DES;   c.  2009 Neg MV;  d. 11/2010 Echo - EF 65%;  e. 02/2013 MV: Carlton Adam Cardiolite that was low risk with no ischemia.  . Autoimmune liver disease     a.  Followed @ [facility] - on chronic prednisone and  sirolimus  . Hyperlipidemia     a. inability to stake statins due to liver disease  . Cellulitis     a. chronic cellulitis of LE  . CKD (chronic kidney disease), stage IV ([place])   . Bulging disc   . Sciatica   . Chronic back pain     a.  On Methadone  . Skin cancer (melanoma) ([place])   . Esophageal stricture     narrowing  . Diabetic neuropathy ([place])   . Sepsis ([place])   . PAF (paroxysmal atrial fibrillation) ([place])     a. Tikosyn initiated 10/2014;  CHA2DS2VASc = 6 (eliquis).  . Chronic diastolic CHF (congestive heart failure) ([place])     a. 06/2014 TEE: EF 60-65%, Gr 2 DD, mild MR, mildly dil LA, Grade IV descending thoracic Ao plaque.    Past Surgical History  Procedure Laterality Date  . Cardiac catheterization      Normal EF  . Coronary angioplasty  2007    2 DES placement to RCA complicated by a wire perforation  . Tubal ligation  1982  . Elbow surgery Left 1994    neuroma removed   . Abscess drainage Right     side  . Lamenectomy  1989    only 1 disc surgery  .  Heart stents  2007  and  2012    X's 1  and  2012 X's 2 stents  . Cardioversion N/A 10/31/2012    Procedure: CARDIOVERSION;  Surgeon: Larey Dresser, MD;  Location[facility] ENDOSCOPY;  Service: Cardiovascular;  Laterality: N/A;  . Tee without cardioversion N/A 06/22/2014    Procedure: TRANSESOPHAGEAL ECHOCARDIOGRAM (TEE);  Surgeon: Larey Dresser, MD;  Location: [facility] ENDOSCOPY;  Service: Cardiovascular;  Laterality: N/A;  . Left heart catheterization with coronary angiogram N/A 08/29/2011    Procedure: LEFT HEART CATHETERIZATION WITH CORONARY ANGIOGRAM;  Surgeon: Wellington Hampshire, MD;  Location: [facility] CATH LAB;  Service: Cardiovascular;  Laterality: N/A;  . Percutaneous coronary stent intervention (pci-s)  08/29/2011    Procedure: PERCUTANEOUS CORONARY STENT INTERVENTION (PCI-S);  Surgeon: Wellington Hampshire, MD;  Location[facility] CATH LAB;  Service: Cardiovascular;;   Family History  Problem Relation Age of Onset  . Lung cancer Mother 53  . Heart attack Father 76  . Heart disease Father   . Congestive Heart Failure Father   . Heart disease Maternal Grandfather   . Hypertension Brother   . Hypertension Son   . Pancreatic cancer Mother    Social History   Social History  . Marital Status: Married    Spouse Name: Jenny Reichmann  . Number of Children: 2  . Years of Education: N/A   Occupational History  . real Sport and exercise psychologist     retired   Social History Main Topics  . Smoking status: Never Smoker   . Smokeless tobacco: Never Used  . Alcohol Use: No  . Drug Use: No  . Sexual Activity: Not on file   Other Topics Concern  . Not on file   Social History Narrative    Pt lives in [place] with husband.  She is retired.  She is not very active @ home.    Review of Systems: Negative except per HPI  Physical Exam: Blood pressure 119/56, pulse 106, temperature 101.3 F (38.5 C), temperature source Rectal, resp. rate 36, SpO2 98 %. General: Lethargic appearing woman lying in bed, appearing uncomfortable HEENT: Possible dried vomitus at corner of mouth, dry mucous membranes, no tonsillar erythema or exudate, pupils constricted by reactive to light, EOMI, No JVD Cardiovascular: Tachycardic with irregular rhythm. No murmurs appreciated.  Pulmonary: Difficult to auscultate given vocalizations with every breath, but no appreciable crackles or wheezes Abdomen: Non-tender, non-distended. Normal bowel sounds. Skin: Tender and erythematous at mid-tibial region. Healed ulcer noted on lateral side of left foot. Raised, non-tender, non-fluctuant nodule noted on proximal left tibia. Mild erythema in tibial region on the right. PICC site non-tender without obvious discharge.  MSK: Tenderness to palpation of shoulders, left greater than right.  Extremities: 2+ edema bilaterally in legs. No clubbing or cyanosis. 1+ symmetric DP pulses Neurological: Lethargic. Oriented to person, year, but not place (thought she was at her facility). Tongue midline, face symmetric. Speech slow but normal. Sensation in tact in distal extremities  Lab results: Basic Metabolic Panel:  Recent Labs  06/08/15 1534  NA 141  K 3.6  CL 98*  CO2 28  GLUCOSE 221*  BUN 13  CREATININE 1.23*  CALCIUM 9.3   Liver Function Tests:  Recent Labs  06/08/15 1534  AST 24  ALT 13*  ALKPHOS 83  BILITOT 0.7  PROT 5.9*  ALBUMIN 2.4*   CBC:  Recent Labs  06/08/15 1534  WBC 23.5*  NEUTROABS 19.1*  HGB 10.6*  HCT 33.8*  MCV 94.9  PLT 318   Urinalysis:  Recent Labs  06/08/15 1612  COLORURINE YELLOW  LABSPEC 1.012  PHURINE 7.0  GLUCOSEU 100*  HGBUR NEGATIVE  BILIRUBINUR NEGATIVE   KETONESUR NEGATIVE  PROTEINUR NEGATIVE  UROBILINOGEN 0.2  NITRITE NEGATIVE  LEUKOCYTESUR NEGATIVE     Imaging results:  Dg Tibia/fibula Left  06/08/2015  CLINICAL DATA:  81 year old with edema and erythema of the left lower leg and foot which began approximately 1 week ago. EXAM: LEFT TIBIA AND FIBULA - 2 VIEW COMPARISON:  No prior tibia-fibula imaging. Left foot CT 05/31/2015. Left knee MRI 11/09/2013. FINDINGS: No evidence of acute, subacute or healed fractures. Mild osseous demineralization. Bone infarcts in the proximal tibial metaphysis and both femoral condyles as noted on the prior MRI. No new intrinsic osseous abnormality. IMPRESSION: 1. No acute or subacute osseous abnormality. 2. 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 Electronically Signed   By: Evangeline Dakin M.D.   On: 06/08/2015 17:13   Dg Chest Port 1 View  06/08/2015  CLINICAL DATA:  81 year old female status post PICC placement. History of MR ASA infection. EXAM: PORTABLE CHEST 1 VIEW COMPARISON:  Chest x-ray 05/31/2015. FINDINGS: New left upper extremity PICC with tip terminating the distal superior vena cava. Chronically elevated right hemidiaphragm. Opacities at the right lung base likely reflect atelectasis and some chronic scarring. Possible small right pleural effusion (likely chronic). Left lung appears clear. No pleural or church and no evidence of pulmonary edema. Heart size is mildly enlarged. No pneumothorax. Upper mediastinal contours are within normal limits. Mild bilateral apical pleuroparenchymal thickening and calcification, most compatible with chronic post infectious or inflammatory scarring. Atherosclerosis in the thoracic aorta. IMPRESSION: 1. Tip of PICC is in the distal superior vena cava. 2. No pneumothorax. 3. Chronically elevated right hemidiaphragm with probable right basilar atelectasis and/or scarring, similar to prior studies. 4. Mild  cardiomegaly. 5. Atherosclerosis. Electronically Signed   By: Vinnie Langton M.D.   On: 06/08/2015 15:35   Dg Foot 2 Views Left  06/08/2015  CLINICAL DATA:  81 year old female with redness and swelling of the left foot and left tibia/ fibula. Wound on the lateral aspect of the left foot. EXAM: LEFT FOOT - 2 VIEW COMPARISON:  No priors. FINDINGS: Mild osteopenia. No focal aggressive areas of osteolysis identified. No acute displaced fractures. Multifocal degenerative changes of osteoarthritis. Numerous vascular calcifications. Soft tissue swelling overlying the forefoot. IMPRESSION: 1. No acute osseous abnormality in the left foot. 2. Diffuse soft tissue swelling most pronounced over the forefoot. 3. Atherosclerosis. 4. Mild osteopenia. Electronically Signed   By: Vinnie Langton M.D.   On: 06/08/2015 17:12    EKG: Ventricular Rate: 164 PR Interval:  QRS Duration: 96 QT Interval: 296 QTC Calculation: 489 R Axis: -85 Text Interpretation: Supraventricular tachycardia Left anterior  fascicular block Anterior infarct, old Repolarization abnormality, prob  rate related afib with RVR Left anterior fasicular block   Assessment & Plan by Problem:  Sepsis: 4/4 SIRS criteria with suspected source of infection her current cellulitis. If indeed the organism is MRSA, she has been treated inappropriately with Ceftriaxone, and it would explain her progression. However, this requires confirmation from her facility at [facility] and [facility]. A request has been made for [facility], and multiple attempts to contact [facility] have not been successful. Other possible sources would be her PICC line, although there is no evidence of an infection on exam. She has been admitted multiple  times for Sepsis 2/2 cellulitis, with earliest known at [facility] in 2013. - Continue vancomycin and zosyn IV until records from [facility] are obtained, then narrow as appropriate - Tylenol prn T  >100.4 - Blood and urine cultures pending - Obtain records from [facility] and [facility]  Atrial Fibrillation with RVR: During her June 2016 appt with Dr. Aundra Dubin with was in NSR, but she has persistently been in Afib with RVR since she arrived in the ED. It could be related to her acute illness or treatment failure with dofetilide. She had missed her subsequent appointments due to hospitalizations at [facility]. - Apixipan 2.5 mg BID - Dofetilide 125 mcg BID  Autoimmune Hepatitis: Followed at [facility]. Currently on prednisone 10 mg daily, but had also been on Sirolimus, but this has been discontinued. LFTs are normal for this hospitalization. - Double prednisone 10 mg daily to 20 mg in setting of acute illness.  Chronic Shoulder and Lower Back Pain: She is on a chronic pain regimen of scheduled MS Contin 60 mg BID and oxycodone IR 5 mg BID. She may have been overdosed at her facility at [facility]. It is likely in the setting of illness, her full dose could worsen her mental status. - Decrease MS Contin to 30 mg BID - Oxycodone 5 mg IR PRN - Gabapentin 100 mg TID  T2DM: On chronic steroids. A1c was 6.9 per our records in 2010, although she likely has other with her PCP. Her glucose was 221 on this admission. It does not appear that she takes anti-hyperglycemics. Her records suggests polyneuropathy, but this was not appreciated on exam.  - Moderate SSI  Diastolic CHF: TEE 86/7619 showed EF 60-65% with grade 2 diastolic dysfunction. Edema noted on exam, but could be related to venous stasis. She and her son deny that she has had any shortness of breath. - Bisoprolol 5 mg daily  Diet: Heart Healthy DVT Prophylaxis: Eliquis Code Status: Full. Patient unable to participate in code status discussion. The subject was discussed with her son Wilfred Lacy. Encouraged ongoing discussions with her and healthcare providers Admit to: Stepdown  Dispo: Disposition is deferred at this time, awaiting  improvement of current medical problems. Anticipated discharge in approximately 4-6 day(s).   The patient does have a current PCP (Imran Gertie Baron, MD) and does not need an [facility] hospital follow-up appointment after discharge.  The patient does have transportation limitations that hinder transportation to clinic appointments.  Signed: Liberty Handy, MD 06/08/2015, 8:58 PM

## 2015-06-08 NOTE — ED Notes (Signed)
Pt arrives from [place] facility. Pt there for MRSA infection. Last seen normal was 11am. Baseline mental status is AAOx4 and ambulatory with assistance. Facility called EMS after finding pt altered, lethargic. EMS gave 1mg  Narcan which made pt more alert. Pt alert to self.

## 2015-06-08 NOTE — ED Notes (Addendum)
Received verbal ok from Dr Vanita Panda to access picc line after viewing portable xray. picc line flushes well and has good blood return.

## 2015-06-08 NOTE — ED Provider Notes (Signed)
CSN: [id number]     Arrival date & time 06/08/15  1505 History   First MD Initiated Contact with Patient 06/08/15 1506     Chief Complaint  Patient presents with  . Altered Mental Status    HISTORY, ROS, AND PHYSICAL EXAM LIMITED BY CRITICAL CONDITION OF PATIENT   (Consider location/radiation/quality/duration/timing/severity/associated sxs/prior Treatment) Patient is a 81 y.o. female presenting with altered mental status. The history is provided by the EMS personnel and the nursing home. The history is limited by the condition of the patient.  Altered Mental Status Presenting symptoms: confusion, disorientation, lethargy and partial responsiveness   Severity:  Moderate Most recent episode:  Today Episode history:  Single Duration:  4 hours Timing:  Constant Progression:  Worsening Chronicity:  New Context: nursing home resident, recent illness and recent infection   Associated symptoms: agitation, fever, rash and weakness     Past Medical History  Diagnosis Date  . Hypertension   . Arthritis   . Diabetes mellitus   . Coronary artery disease     a.  MI 01/2003;  b.  02/2006 NSTEMI - LAD 80-57m, LCX nl, RCA 53m - RCA stented w/ Taxus DES;   c.  2009 Neg MV;  d. 11/2010 Echo - EF 65%;  e. 02/2013 MV: Carlton Adam Cardiolite that was low risk with no ischemia.  . Autoimmune liver disease     a.  Followed @ [facility] - on chronic prednisone and  sirolimus  . Hyperlipidemia     a. inability to stake statins due to liver disease  . Cellulitis     a. chronic cellulitis of LE  . CKD (chronic kidney disease), stage IV ([place])   . Bulging disc   . Sciatica   . Chronic back pain     a.  On Methadone  . Skin cancer (melanoma) ([place])   . Esophageal stricture     narrowing  . Diabetic neuropathy ([place])   . Sepsis ([place])   . PAF (paroxysmal atrial fibrillation) ([place])     a. Tikosyn initiated 10/2014;  CHA2DS2VASc = 6 (eliquis).  . Chronic diastolic CHF (congestive heart failure) ([place])     a. 06/2014  TEE: EF 60-65%, Gr 2 DD, mild MR, mildly dil LA, Grade IV descending thoracic Ao plaque.   Past Surgical History  Procedure Laterality Date  . Cardiac catheterization      Normal EF  . Coronary angioplasty  2007    2 DES placement to RCA complicated by a wire perforation  . Tubal ligation  1982  . Elbow surgery Left 1994    neuroma removed   . Abscess drainage Right     side  . Lamenectomy  1989    only 1 disc surgery  . Heart stents  2007  and  2012    X's 1  and  2012 X's 2 stents  . Cardioversion N/A 10/31/2012    Procedure: CARDIOVERSION;  Surgeon: Larey Dresser, MD;  Location: [facility] ENDOSCOPY;  Service: Cardiovascular;  Laterality: N/A;  . Tee without cardioversion N/A 06/22/2014    Procedure: TRANSESOPHAGEAL ECHOCARDIOGRAM (TEE);  Surgeon: Larey Dresser, MD;  Location: [facility] ENDOSCOPY;  Service: Cardiovascular;  Laterality: N/A;  . Left heart catheterization with coronary angiogram N/A 08/29/2011    Procedure: LEFT HEART CATHETERIZATION WITH CORONARY ANGIOGRAM;  Surgeon: Wellington Hampshire, MD;  Location: [facility] CATH LAB;  Service: Cardiovascular;  Laterality: N/A;  . Percutaneous coronary stent intervention (pci-s)  08/29/2011    Procedure: PERCUTANEOUS CORONARY  STENT INTERVENTION (PCI-S);  Surgeon: Wellington Hampshire, MD;  Location: [facility] CATH LAB;  Service: Cardiovascular;;   Family History  Problem Relation Age of Onset  . Lung cancer Mother 2  . Heart attack Father 31  . Heart disease Father   . Congestive Heart Failure Father   . Heart disease Maternal Grandfather   . Hypertension Brother   . Hypertension Son   . Pancreatic cancer Mother    Social History  Substance Use Topics  . Smoking status: Never Smoker   . Smokeless tobacco: Never Used  . Alcohol Use: No   OB History    No data available     Review of Systems  Unable to perform ROS: Mental status change  Constitutional: Positive for fever.  Skin: Positive for rash.  Neurological: Positive for weakness.   Psychiatric/Behavioral: Positive for confusion and agitation.      Allergies  Cyclosporine; Azathioprine; Mycophenolate; Other; and Oysters  Home Medications   Prior to Admission medications   Medication Sig Start Date End Date Taking? Authorizing Provider  alendronate (FOSAMAX) 70 MG tablet Take 70 mg by mouth once a week. Take with a full glass of water on an empty stomach.   Yes Historical Provider, MD  apixaban (ELIQUIS) 2.5 MG TABS tablet Take 1 tablet (2.5 mg total) by mouth 2 (two) times daily. 03/07/15  Yes Larey Dresser, MD  Ascorbic Acid (VITAMIN C) 1000 MG tablet Take 1,000 mg by mouth 2 (two) times daily.    Yes Historical Provider, MD  bisoprolol (ZEBETA) 5 MG tablet Take 1 tablet (5 mg total) by mouth daily. 05/06/15  Yes Larey Dresser, MD  cefTRIAXone (ROCEPHIN) 1 G injection Inject 1 g into the vein daily.   Yes Historical Provider, MD  Cholecalciferol (VITAMIN D PO) Take 2,000 Units by mouth at bedtime.    Yes Historical Provider, MD  docusate sodium (COLACE) 100 MG capsule Take 100 mg by mouth at bedtime.   Yes Historical Provider, MD  dofetilide (TIKOSYN) 125 MCG capsule Take 1 capsule (125 mcg total) by mouth 2 (two) times daily. 03/07/15  Yes Larey Dresser, MD  ezetimibe (ZETIA) 10 MG tablet Take 10 mg by mouth daily.     Yes Historical Provider, MD  Fish Oil-Cholecalciferol (FISH OIL + D3 PO) Take 1 capsule by mouth 2 (two) times daily.   Yes Historical Provider, MD  folic acid (FOLVITE) 409 MCG tablet Take 400 mcg by mouth daily.   Yes Historical Provider, MD  furosemide (LASIX) 40 MG tablet Take 60 mg by mouth at bedtime.  02/28/15  Yes Larey Dresser, MD  gabapentin (NEURONTIN) 100 MG capsule Take 100 mg by mouth 3 (three) times daily.   Yes Historical Provider, MD  hydrALAZINE (APRESOLINE) 100 MG tablet Take 100 mg by mouth 3 (three) times daily.   Yes Historical Provider, MD  LORazepam (ATIVAN) 1 MG tablet Take 1 mg by mouth every 4 (four) hours as needed for  anxiety.   Yes Historical Provider, MD  magnesium oxide (MAG-OX) 400 (241.3 MG) MG tablet Take 1 tablet (400 mg total) by mouth daily. 10/22/14  Yes Rhonda G Barrett, PA-C  metolazone (ZAROXOLYN) 2.5 MG tablet Take 2.5 mg by mouth once a week.   Yes Historical Provider, MD  morphine (MS CONTIN) 60 MG 12 hr tablet Take 1 tablet by mouth 2 (two) times daily. 02/21/15  Yes Historical Provider, MD  nitroGLYCERIN (NITROSTAT) 0.4 MG SL tablet Place 1 tablet (0.4 mg  total) under the tongue every 5 (five) minutes as needed for chest pain. 09/01/11  Yes Rogelia Mire, NP  omeprazole (PRILOSEC) 20 MG capsule Take 20 mg by mouth daily.  06/06/14  Yes Historical Provider, MD  oxyCODONE (OXY IR/ROXICODONE) 5 MG immediate release tablet Take 5 mg by mouth 2 (two) times daily as needed for moderate pain.  09/16/14  Yes Historical Provider, MD  pravastatin (PRAVACHOL) 80 MG tablet Take 1 tablet (80 mg total) by mouth every evening. 10/11/14  Yes Larey Dresser, MD  predniSONE (DELTASONE) 10 MG tablet Take 10 mg by mouth daily.     Yes Historical Provider, MD  senna (SENOKOT) 8.6 MG TABS tablet Take 1 tablet by mouth daily as needed for mild constipation.   Yes Historical Provider, MD  sulfamethoxazole-trimethoprim (BACTRIM DS,SEPTRA DS) 800-160 MG tablet TK 1 T PO BID FOR 10 DAYS 04/27/15   Historical Provider, MD   BP 150/73 mmHg  Pulse 128  Temp(Src) 103.7 F (39.8 C) (Oral)  Resp 30  SpO2 96% Physical Exam  Constitutional: She appears well-developed and well-nourished. She appears lethargic. She appears distressed.  HENT:  Head: Normocephalic and atraumatic.  Right Ear: External ear normal.  Left Ear: External ear normal.  Nose: Nose normal.  Mouth/Throat: Oropharynx is clear and moist. No oropharyngeal exudate.  Eyes: Conjunctivae and EOM are normal. Pupils are equal, round, and reactive to light. Right eye exhibits no discharge. Left eye exhibits no discharge. No scleral icterus.  Neck: Normal range  of motion. Neck supple. No JVD present. No tracheal deviation present. No thyromegaly present.  Cardiovascular: Intact distal pulses.  An irregularly irregular rhythm present. Tachycardia present.   Pulmonary/Chest: Effort normal and breath sounds normal. No stridor. No respiratory distress. She has no wheezes. She has no rales. She exhibits no tenderness.  Abdominal: Soft. She exhibits no distension.  Musculoskeletal: Normal range of motion. She exhibits no edema or tenderness.  Lymphadenopathy:    She has no cervical adenopathy.  Neurological: She appears lethargic. She is disoriented. She displays tremor. She displays no atrophy. She exhibits normal muscle tone. She displays no seizure activity. GCS eye subscore is 3. GCS verbal subscore is 4. GCS motor subscore is 6.  Skin: Skin is warm and dry. Rash noted. She is not diaphoretic. No erythema. No pallor.  Psychiatric: Her affect is blunt. She is agitated. Cognition and memory are impaired. She is noncommunicative.  Nursing note and vitals reviewed.   ED Course  Procedures (including critical care time) Labs Review Labs Reviewed  COMPREHENSIVE METABOLIC PANEL - Abnormal; Notable for the following:    Chloride 98 (*)    Glucose, Bld 221 (*)    Creatinine, Ser 1.23 (*)    Total Protein 5.9 (*)    Albumin 2.4 (*)    ALT 13 (*)    GFR calc non Af Amer 41 (*)    GFR calc Af Amer 47 (*)    All other components within normal limits  CBC WITH DIFFERENTIAL/PLATELET - Abnormal; Notable for the following:    WBC 23.5 (*)    RBC 3.56 (*)    Hemoglobin 10.6 (*)    HCT 33.8 (*)    RDW 19.2 (*)    Neutro Abs 19.1 (*)    Monocytes Absolute 1.5 (*)    All other components within normal limits  URINALYSIS, ROUTINE W REFLEX MICROSCOPIC (NOT AT [facility]) - Abnormal; Notable for the following:    Glucose, UA 100 (*)  All other components within normal limits  SEDIMENTATION RATE - Abnormal; Notable for the following:    Sed Rate 88 (*)    All  other components within normal limits  C-REACTIVE PROTEIN - Abnormal; Notable for the following:    CRP 11.5 (*)    All other components within normal limits  GLUCOSE, CAPILLARY - Abnormal; Notable for the following:    Glucose-Capillary 179 (*)    All other components within normal limits  CBC - Abnormal; Notable for the following:    WBC 21.4 (*)    RBC 3.14 (*)    Hemoglobin 9.2 (*)    HCT 30.3 (*)    RDW 19.5 (*)    All other components within normal limits  GLUCOSE, CAPILLARY - Abnormal; Notable for the following:    Glucose-Capillary 191 (*)    All other components within normal limits  GLUCOSE, CAPILLARY - Abnormal; Notable for the following:    Glucose-Capillary 193 (*)    All other components within normal limits  GLUCOSE, CAPILLARY - Abnormal; Notable for the following:    Glucose-Capillary 322 (*)    All other components within normal limits  GLUCOSE, CAPILLARY - Abnormal; Notable for the following:    Glucose-Capillary 244 (*)    All other components within normal limits  GLUCOSE, CAPILLARY - Abnormal; Notable for the following:    Glucose-Capillary 293 (*)    All other components within normal limits  GLUCOSE, CAPILLARY - Abnormal; Notable for the following:    Glucose-Capillary 205 (*)    All other components within normal limits  I-STAT CG4 LACTIC ACID, ED - Abnormal; Notable for the following:    Lactic Acid, Venous 2.26 (*)    All other components within normal limits  I-STAT ARTERIAL BLOOD GAS, ED - Abnormal; Notable for the following:    pH, Arterial 7.541 (*)    pCO2 arterial 33.1 (*)    pO2, Arterial 115.0 (*)    Bicarbonate 27.9 (*)    Acid-Base Excess 6.0 (*)    All other components within normal limits  CULTURE, BLOOD (ROUTINE X 2)  CULTURE, BLOOD (ROUTINE X 2)  URINE CULTURE  MRSA PCR SCREENING  CULTURE, EXPECTORATED SPUTUM-ASSESSMENT  VANCOMYCIN, RANDOM  HEMOGLOBIN X4G  BASIC METABOLIC PANEL  MAGNESIUM  CBC WITH DIFFERENTIAL/PLATELET   I-STAT CG4 LACTIC ACID, ED  I-STAT CG4 LACTIC ACID, ED    Imaging Review Dg Tibia/fibula Left  06/08/2015  CLINICAL DATA:  81 year old with edema and erythema of the left lower leg and foot which began approximately 1 week ago. EXAM: LEFT TIBIA AND FIBULA - 2 VIEW COMPARISON:  No prior tibia-fibula imaging. Left foot CT 05/31/2015. Left knee MRI 11/09/2013. FINDINGS: No evidence of acute, subacute or healed fractures. Mild osseous demineralization. Bone infarcts in the proximal tibial metaphysis and both femoral condyles as noted on the prior MRI. No new intrinsic osseous abnormality. IMPRESSION: 1. No acute or subacute osseous abnormality. 2. 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 Electronically Signed   By: Evangeline Dakin M.D.   On: 06/08/2015 17:13   Dg Chest Port 1 View  06/08/2015  CLINICAL DATA:  81 year old female status post PICC placement. History of MR ASA infection. EXAM: PORTABLE CHEST 1 VIEW COMPARISON:  Chest x-ray 05/31/2015. FINDINGS: New left upper extremity PICC with tip terminating the distal superior vena cava. Chronically elevated right hemidiaphragm. Opacities at the right lung base likely reflect atelectasis and some chronic scarring. Possible  small right pleural effusion (likely chronic). Left lung appears clear. No pleural or church and no evidence of pulmonary edema. Heart size is mildly enlarged. No pneumothorax. Upper mediastinal contours are within normal limits. Mild bilateral apical pleuroparenchymal thickening and calcification, most compatible with chronic post infectious or inflammatory scarring. Atherosclerosis in the thoracic aorta. IMPRESSION: 1. Tip of PICC is in the distal superior vena cava. 2. No pneumothorax. 3. Chronically elevated right hemidiaphragm with probable right basilar atelectasis and/or scarring, similar to prior studies. 4. Mild cardiomegaly. 5. Atherosclerosis. Electronically Signed    By: Vinnie Langton M.D.   On: 06/08/2015 15:35   Dg Foot 2 Views Left  06/08/2015  CLINICAL DATA:  81 year old female with redness and swelling of the left foot and left tibia/ fibula. Wound on the lateral aspect of the left foot. EXAM: LEFT FOOT - 2 VIEW COMPARISON:  No priors. FINDINGS: Mild osteopenia. No focal aggressive areas of osteolysis identified. No acute displaced fractures. Multifocal degenerative changes of osteoarthritis. Numerous vascular calcifications. Soft tissue swelling overlying the forefoot. IMPRESSION: 1. No acute osseous abnormality in the left foot. 2. Diffuse soft tissue swelling most pronounced over the forefoot. 3. Atherosclerosis. 4. Mild osteopenia. Electronically Signed   By: Vinnie Langton M.D.   On: 06/08/2015 17:12   I have personally reviewed and evaluated these images and lab results as part of my medical decision-making.   EKG Interpretation   Date/Time:  Wednesday June 08 2015 15:52:16 EDT Ventricular Rate:  164 PR Interval:    QRS Duration: 96 QT Interval:  296 QTC Calculation: 489 R Axis:   -85 Text Interpretation:  Supraventricular tachycardia Left anterior  fascicular block Anterior infarct, old Repolarization abnormality, prob  rate related afib with RVR Left anterior fasicular block Abnormal ekg  Confirmed by Carmin Muskrat  MD [phone]) on 06/08/2015 4:01:01 PM      MDM   Final diagnoses:  Fever in adult    Pt arrived from SNF for AMS.  Found to be febrile and tachycardic in AF w/RVR.  Pt confused on exam.  Being Tx for MRSA.  SNF reported Rocephin x 3 days at SNF through PICC line.  No signs of infection at PICC site.  Pt has notable LLE cellulitis and swelling.  Small stage 2 sacral ulcer noted upon arrival, w/o associated cellulitis.  Low likelihood of necrotizing fasciitis or osteomyelitis, but pt has a foot eschar noted.  Will screen with XR.  Inflammatory markers.  IVF bolus, broad spectrum ABX with MRSA coverage.  Vancomycin  Random obtained in case pt has already been on Vancomycin, to avoid supratherapeutic load.  Pt was sent to SNF from OSH.  ABX records not currently available.  Pt requiring emergent resuscitation.  Pt HR increased briefly and was in SVT.  Suspect underlying A Flutter, as this resolved to rate controlled AF following IVF boluses and rectal tylenol.  Pt discussed with medicine and will be admitted to stepdown unit.  Patient care was discussed with my attending, Dr. Vanita Panda.     Hoyle Sauer, MD 06/09/15 [id number]  Carmin Muskrat, MD 06/13/15 1102

## 2015-06-08 NOTE — Progress Notes (Signed)
Received report from ED, pt to go to 2C04.

## 2015-06-09 ENCOUNTER — Inpatient Hospital Stay (HOSPITAL_COMMUNITY): Payer: Medicare Other

## 2015-06-09 DIAGNOSIS — I5032 Chronic diastolic (congestive) heart failure: Secondary | ICD-10-CM

## 2015-06-09 DIAGNOSIS — B9689 Other specified bacterial agents as the cause of diseases classified elsewhere: Secondary | ICD-10-CM

## 2015-06-09 DIAGNOSIS — L03116 Cellulitis of left lower limb: Secondary | ICD-10-CM

## 2015-06-09 DIAGNOSIS — K754 Autoimmune hepatitis: Secondary | ICD-10-CM

## 2015-06-09 DIAGNOSIS — M25511 Pain in right shoulder: Secondary | ICD-10-CM

## 2015-06-09 DIAGNOSIS — Z452 Encounter for adjustment and management of vascular access device: Secondary | ICD-10-CM | POA: Insufficient documentation

## 2015-06-09 DIAGNOSIS — B9561 Methicillin susceptible Staphylococcus aureus infection as the cause of diseases classified elsewhere: Secondary | ICD-10-CM

## 2015-06-09 DIAGNOSIS — G8929 Other chronic pain: Secondary | ICD-10-CM

## 2015-06-09 DIAGNOSIS — A419 Sepsis, unspecified organism: Secondary | ICD-10-CM

## 2015-06-09 DIAGNOSIS — M25512 Pain in left shoulder: Secondary | ICD-10-CM

## 2015-06-09 DIAGNOSIS — I4891 Unspecified atrial fibrillation: Secondary | ICD-10-CM

## 2015-06-09 DIAGNOSIS — M545 Low back pain: Secondary | ICD-10-CM

## 2015-06-09 DIAGNOSIS — E119 Type 2 diabetes mellitus without complications: Secondary | ICD-10-CM

## 2015-06-09 LAB — CBC
HCT: 30.3 % — ABNORMAL LOW (ref 36.0–46.0)
Hemoglobin: 9.2 g/dL — ABNORMAL LOW (ref 12.0–15.0)
MCH: 29.3 pg (ref 26.0–34.0)
MCHC: 30.4 g/dL (ref 30.0–36.0)
MCV: 96.5 fL (ref 78.0–100.0)
Platelets: 265 10*3/uL (ref 150–400)
RBC: 3.14 MIL/uL — AB (ref 3.87–5.11)
RDW: 19.5 % — ABNORMAL HIGH (ref 11.5–15.5)
WBC: 21.4 10*3/uL — ABNORMAL HIGH (ref 4.0–10.5)

## 2015-06-09 LAB — GLUCOSE, CAPILLARY
GLUCOSE-CAPILLARY: 179 mg/dL — AB (ref 65–99)
GLUCOSE-CAPILLARY: 191 mg/dL — AB (ref 65–99)
GLUCOSE-CAPILLARY: 244 mg/dL — AB (ref 65–99)
Glucose-Capillary: 193 mg/dL — ABNORMAL HIGH (ref 65–99)
Glucose-Capillary: 322 mg/dL — ABNORMAL HIGH (ref 65–99)
Glucose-Capillary: 293 mg/dL — ABNORMAL HIGH (ref 65–99)
Glucose-Capillary: 205 mg/dL — ABNORMAL HIGH (ref 65–99)
Glucose-Capillary: 166 mg/dL — ABNORMAL HIGH (ref 65–99)

## 2015-06-09 LAB — URINE CULTURE

## 2015-06-09 LAB — VANCOMYCIN, RANDOM: Vancomycin Rm: <4 ug/mL

## 2015-06-09 MED ORDER — GLUCERNA SHAKE PO LIQD: 237.0000 mL | Freq: Two times a day (BID) | ORAL | Status: DC

## 2015-06-09 MED ORDER — HYDROCORTISONE NA SUCCINATE PF 100 MG IJ SOLR
50.0000 mg | Freq: Once | INTRAMUSCULAR | Status: AC
  Administered 2015-06-09: 50 mg via INTRAVENOUS
  Filled 2015-06-09: qty 2

## 2015-06-09 MED ORDER — SODIUM CHLORIDE 0.9 % IV BOLUS (SEPSIS): 500.0000 mL | Freq: Once | INTRAVENOUS | Status: DC

## 2015-06-09 MED ORDER — PIPERACILLIN-TAZOBACTAM 3.375 G IVPB
3.3750 g | Freq: Three times a day (TID) | INTRAVENOUS | Status: DC
  Administered 2015-06-09 – 2015-06-13 (×13): 3.375 g via INTRAVENOUS
  Filled 2015-06-09 (×17): qty 50

## 2015-06-09 MED ORDER — BISOPROLOL FUMARATE 5 MG PO TABS
2.5000 mg | ORAL_TABLET | Freq: Every day | ORAL | Status: DC
  Administered 2015-06-09 – 2015-06-12 (×4): 2.5 mg via ORAL
  Filled 2015-06-09: qty 0.5
  Filled 2015-06-09: qty 1
  Filled 2015-06-09: qty 0.5
  Filled 2015-06-09: qty 1
  Filled 2015-06-09: qty 0.5

## 2015-06-09 NOTE — Progress Notes (Addendum)
Dr. Johnnette Gourd and myself went to evaluate [name] after an acute drop in blood pressure (113/70 to 97/43) in the setting of sepsis after receiving bisoprolol. When we arrived to the room, we noted her blood pressure had decreased to 79/43, and she appeared lethargic but arousable to voice. I repeated her blood pressure, and it had improved to 97/55. She had already receive 1.5L NS in the ED. She has not received any of her prednisone since arrival, which she takes at home for autoimmune hepatitis.  Filed Vitals:   06/09/15 0028  BP: 97/43  Pulse:   Temp: 101.4 F (38.6 C)  Resp:    General: Lethargic and lying bed Pulmonary: Clear to ausculation bilaterally Extremities: 1+ DP pulses bilaterally Neurological: Improved orientation. Oriented to place, whereas before she did not know she was in the hospital. She had previously been oriented to person and year.  Assessment and Plan  Based on her weight, she has already received adequate fluid resuscitation. In the setting of sepsis and lack of her home steroids since arrival, this is likely relative adrenal insufficiency. On telemetry, she is no longer in Afib or tachycardic, but displays possible atrial bigeminy.  - 50 mg IV hydrocortisone - Increase prednisone to 20 mg daily

## 2015-06-09 NOTE — Clinical Social Work Note (Signed)
Clinical Social Work Assessment  Patient Details  Name: Cynthia Hicks MRN: [id number] Date of Birth: [birth year]  Date of referral:  06/09/15               Reason for consult:  Facility Placement                Permission sought to share information with:    Permission granted to share information::  Yes, Verbal Permission Granted  Name::        Agency::  [facility]  Relationship::     Contact Information:     Housing/Transportation Living arrangements for the past 2 months:  [place], [place] of Information:  Patient Patient Interpreter Needed:  None Criminal Activity/Legal Involvement Pertinent to Current Situation/Hospitalization:  No - Comment as needed Significant Relationships:  Adult Children Lives with:  Self Do you feel safe going back to the place where you live?  No (needs further rehab) Need for family participation in patient care:  No (Coment)  Care giving concerns:  Pt does not have sufficient support at home to return home at this time   Facilities manager / plan:  CSW spoke with pt about plan at time of DC  Employment status:  Retired Forensic scientist:  Medicare PT Recommendations:  Not assessed at this time Information / Referral to community resources:  [place]  Patient/Family's Response to care:  Pt reports that she was staying at [facility] and does not want to return at time of DC- she is interested in placement in [place] or [place] counties.    Patient/Family's Understanding of and Emotional Response to Diagnosis, Current Treatment, and Prognosis:  Pt seems to have good understanding of condition- no questions or concerns at this time  Emotional Assessment Appearance:  Appears stated age Attitude/Demeanor/Rapport:  Lethargic Affect (typically observed):  Appropriate Orientation:  Oriented to Self, Oriented to Place, Oriented to  Time, Oriented to  Situation Alcohol / Substance use:  Not Applicable Psych involvement (Current and /or in the community):  No (Comment)  Discharge Needs  Concerns to be addressed:  Care Coordination Readmission within the last 30 days:  No Current discharge risk:  Physical Impairment Barriers to Discharge:  Continued Medical Work up   [employer], LCSW 06/09/2015, 1:14 PM

## 2015-06-09 NOTE — Progress Notes (Signed)
   06/09/15 0900  Clinical Encounter Type  Visited With Patient  Visit Type Spiritual support  Referral From [place] responded to request for adv dir. Patient was very sleepy but said that she already has a living will and did not request an adv dir.

## 2015-06-09 NOTE — Progress Notes (Signed)
Subjective: Patient doing better this morning, eating breakfast.  No complaints.  Objective: Vital signs in last 24 hours: Filed Vitals:   06/09/15 0400 06/09/15 0500 06/09/15 0700 06/09/15 1000  BP: 106/43  97/48 129/47  Pulse: 70  81 82  Temp: 99.4 F (37.4 C)  99.1 F (37.3 C)   TempSrc: Rectal  Rectal   Resp: 27  24 20   Height:      Weight:  117 lb 11.6 oz (53.4 kg)    SpO2: 96%  96% 88%   Weight change:   Intake/Output Summary (Last 24 hours) at 06/09/15 1132 Last data filed at 06/09/15 5093  Gross per 24 hour  Intake   1420 ml  Output      0 ml  Net   1420 ml   General: resting in bed, frail appearing, eating breakfast HEENT: EOMI, no scleral icterus Cardiac: Regular rate, irregular rhythm, no rubs, murmurs or gallops Pulm: clear to auscultation bilaterally, poor inspiratory effort Ext: Healing ulcer on lateral aspect of left foot, erythema at mid-tibial region.  She does have a raised, non-fluctuant nodule on proximal left tibia that patient attributes to injury sustained at SNF when food tray fell on her leg.  2+ edema present bilaterally in lower extremities.  Dorsalis pedis pulses intact. Neuro: alert and oriented X3, cranial nerves II-XII grossly intact  Lab Results: Basic Metabolic Panel:  Recent Labs Lab 06/08/15 1534  NA 141  K 3.6  CL 98*  CO2 28  GLUCOSE 221*  BUN 13  CREATININE 1.23*  CALCIUM 9.3   Liver Function Tests:  Recent Labs Lab 06/08/15 1534  AST 24  ALT 13*  ALKPHOS 83  BILITOT 0.7  PROT 5.9*  ALBUMIN 2.4*   No results for input(s): LIPASE, AMYLASE in the last 168 hours. No results for input(s): AMMONIA in the last 168 hours. CBC:  Recent Labs Lab 06/08/15 1534  WBC 23.5*  NEUTROABS 19.1*  HGB 10.6*  HCT 33.8*  MCV 94.9  PLT 318   Cardiac Enzymes: No results for input(s): CKTOTAL, CKMB, CKMBINDEX, TROPONINI in the last 168 hours. BNP: No results for input(s): PROBNP in the last 168 hours. D-Dimer: No  results for input(s): DDIMER in the last 168 hours. CBG:  Recent Labs Lab 06/09/15 0022 06/09/15 0426 06/09/15 0753  GLUCAP 179* 191* 193*   Hemoglobin A1C: No results for input(s): HGBA1C in the last 168 hours. Fasting Lipid Panel: No results for input(s): CHOL, HDL, LDLCALC, TRIG, CHOLHDL, LDLDIRECT in the last 168 hours. Thyroid Function Tests: No results for input(s): TSH, T4TOTAL, FREET4, T3FREE, THYROIDAB in the last 168 hours. Coagulation: No results for input(s): LABPROT, INR in the last 168 hours. Anemia Panel: No results for input(s): VITAMINB12, FOLATE, FERRITIN, TIBC, IRON, RETICCTPCT in the last 168 hours. Urine Drug Screen: Drugs of Abuse  No results found for: LABOPIA, COCAINSCRNUR, LABBENZ, AMPHETMU, THCU, LABBARB  Alcohol Level: No results for input(s): ETH in the last 168 hours. Urinalysis:  Recent Labs Lab 06/08/15 1612  COLORURINE YELLOW  LABSPEC 1.012  PHURINE 7.0  GLUCOSEU 100*  HGBUR NEGATIVE  BILIRUBINUR NEGATIVE  KETONESUR NEGATIVE  PROTEINUR NEGATIVE  UROBILINOGEN 0.2  NITRITE NEGATIVE  LEUKOCYTESUR NEGATIVE   Misc. Labs: none  Micro Results: Recent Results (from the past 240 hour(s))  Culture, blood (routine x 2)     Status: None (Preliminary result)   Collection Time: 06/08/15  3:55 PM  Result Value Ref Range Status   Specimen Description BLOOD RIGHT HAND  Final   Special Requests IN PEDIATRIC BOTTLE 2CC  Final   Culture PENDING  Incomplete   Report Status PENDING  Incomplete  Urine culture     Status: None (Preliminary result)   Collection Time: 06/08/15  4:12 PM  Result Value Ref Range Status   Specimen Description URINE, RANDOM  Final   Special Requests NONE  Final   Culture NO GROWTH < 24 HOURS  Final   Report Status PENDING  Incomplete  MRSA PCR Screening     Status: None   Collection Time: 06/08/15 10:36 PM  Result Value Ref Range Status   MRSA by PCR NEGATIVE NEGATIVE Final    Comment:        The GeneXpert MRSA Assay  (FDA approved for NASAL specimens only), is one component of a comprehensive MRSA colonization surveillance program. It is not intended to diagnose MRSA infection nor to guide or monitor treatment for MRSA infections.    Studies/Results: Dg Tibia/fibula Left  06/08/2015  CLINICAL DATA:  81 year old with edema and erythema of the left lower leg and foot which began approximately 1 week ago. EXAM: LEFT TIBIA AND FIBULA - 2 VIEW COMPARISON:  No prior tibia-fibula imaging. Left foot CT 05/31/2015. Left knee MRI 11/09/2013. FINDINGS: No evidence of acute, subacute or healed fractures. Mild osseous demineralization. Bone infarcts in the proximal tibial metaphysis and both femoral condyles as noted on the prior MRI. No new intrinsic osseous abnormality. IMPRESSION: 1. No acute or subacute osseous abnormality. 2. 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 Electronically Signed   By: Evangeline Dakin M.D.   On: 06/08/2015 17:13   Dg Chest Port 1 View  06/08/2015  CLINICAL DATA:  81 year old female status post PICC placement. History of MR ASA infection. EXAM: PORTABLE CHEST 1 VIEW COMPARISON:  Chest x-ray 05/31/2015. FINDINGS: New left upper extremity PICC with tip terminating the distal superior vena cava. Chronically elevated right hemidiaphragm. Opacities at the right lung base likely reflect atelectasis and some chronic scarring. Possible small right pleural effusion (likely chronic). Left lung appears clear. No pleural or church and no evidence of pulmonary edema. Heart size is mildly enlarged. No pneumothorax. Upper mediastinal contours are within normal limits. Mild bilateral apical pleuroparenchymal thickening and calcification, most compatible with chronic post infectious or inflammatory scarring. Atherosclerosis in the thoracic aorta. IMPRESSION: 1. Tip of PICC is in the distal superior vena cava. 2. No pneumothorax. 3. Chronically elevated  right hemidiaphragm with probable right basilar atelectasis and/or scarring, similar to prior studies. 4. Mild cardiomegaly. 5. Atherosclerosis. Electronically Signed   By: Vinnie Langton M.D.   On: 06/08/2015 15:35   Dg Foot 2 Views Left  06/08/2015  CLINICAL DATA:  81 year old female with redness and swelling of the left foot and left tibia/ fibula. Wound on the lateral aspect of the left foot. EXAM: LEFT FOOT - 2 VIEW COMPARISON:  No priors. FINDINGS: Mild osteopenia. No focal aggressive areas of osteolysis identified. No acute displaced fractures. Multifocal degenerative changes of osteoarthritis. Numerous vascular calcifications. Soft tissue swelling overlying the forefoot. IMPRESSION: 1. No acute osseous abnormality in the left foot. 2. Diffuse soft tissue swelling most pronounced over the forefoot. 3. Atherosclerosis. 4. Mild osteopenia. Electronically Signed   By: Vinnie Langton M.D.   On: 06/08/2015 17:12   Medications:  Scheduled Meds: . apixaban  2.5 mg Oral BID  . bisoprolol  2.5 mg Oral QHS  . dofetilide  125 mcg Oral  BID  . feeding supplement (ENSURE ENLIVE)  237 mL Oral BID BM  . fentaNYL (SUBLIMAZE) injection  25 mcg Intravenous Once  . gabapentin  100 mg Oral TID  . insulin aspart  0-15 Units Subcutaneous 6 times per day  . morphine  30 mg Oral BID  . piperacillin-tazobactam (ZOSYN)  IV  3.375 g Intravenous 3 times per day  . predniSONE  20 mg Oral QAC breakfast  . sodium chloride  3 mL Intravenous Q12H   Continuous Infusions:  PRN Meds:.acetaminophen **OR** acetaminophen, LORazepam, oxyCODONE Assessment/Plan: Active Problems:   Anemia of chronic disease   HTN (hypertension)   Atrial fibrillation (HCC)   Chronic diastolic CHF (congestive heart failure) ([place])   Diabetes mellitus, type 2 ([place])   Long term current use of systemic steroids   CKD (chronic kidney disease), stage III   Sepsis ([place])   Sepsis due to methicillin resistant Staphylococcus aureus ([place])   PICC  (peripherally inserted central catheter) in place  Sepsis/LLE cellulitis: 4/4 SIRS criteria with suspected source of infection her current cellulitis on admission.  Patient had been receiving ceftriaxone for MSSA bacteremia likely due to her cellulitis.  Foot cultures from [facility] grew MSSA and Capnocytophaga species.  Blood cultures from [place] grew MSSA. - Continue broad coverage with Vancomycin and Zosyn [ ]  Blood cultures x 2 pending [ ]  Urine culture NGTD [ ]  ABIs  Atrial Fibrillation with RVR - Apixipan 2.5 mg BID - Dofetilide 125 mcg BID  Autoimmune Hepatitis - Double prednisone 10 mg daily to 20 mg in setting of acute illness.  Chronic Shoulder and Lower Back Pain: She is on a chronic pain regimen of scheduled MS Contin 60 mg BID and oxycodone IR 5 mg BID - Decrease MS Contin to 30 mg BID - Oxycodone 5 mg IR PRN - Gabapentin 100 mg TID  T2DM - Moderate SSI  Diastolic CHF: TEE 04/4708 showed EF 60-65% with grade 2 diastolic dysfunction. Edema noted on exam, but could be related to venous stasis. She and her son deny that she has had any shortness of breath. - Bisoprolol 5 mg daily  Diet: Heart Healthy  DVT Prophylaxis: Eliquis  Code Status: Full  Dispo: Disposition is deferred at this time, awaiting improvement of current medical problems.    The patient does have a current PCP (Imran Gertie Baron, MD) and does not need an [facility] hospital follow-up appointment after discharge.  The patient does not have transportation limitations that hinder transportation to clinic appointments.  .Services Needed at time of discharge: Y = Yes, Blank = No PT:   OT:   RN:   Equipment:   Other:     LOS: 1 day   Jule Ser, DO 06/09/2015, 11:32 AM

## 2015-06-09 NOTE — Progress Notes (Signed)
Utilization Review Completed.Donne Anon T11/10/2014

## 2015-06-09 NOTE — NC FL2 (Signed)
[place] LEVEL OF CARE SCREENING TOOL     IDENTIFICATION  Patient Name: Cynthia Hicks Birthdate: 05-Jul-1933 Sex: female Admission Date (Current Location): 06/08/2015  [facility] and [place] Number: [employer] and Address:  [facility] [place], [place] [address] [place], [place] [address]      Provider Number: [id number]  Attending Physician Name and Address:  Campbell Riches, MD  Relative Name and Phone Number:       Current Level of Care: Hospital Recommended Level of Care: [place] Prior Approval Number:    Date Approved/Denied:   PASRR Number:    Discharge Plan: SNF    Current Diagnoses: Patient Active Problem List   Diagnosis Date Noted  . PICC (peripherally inserted central catheter) in place   . Sepsis ([place]) 06/08/2015  . Sepsis due to methicillin resistant Staphylococcus aureus ([place]) 06/08/2015  . Visit for monitoring Tikosyn therapy   . CKD (chronic kidney disease), stage III 06/24/2014  . CAP (community acquired pneumonia)   . Acute on chronic diastolic CHF (congestive heart failure) ([place])   . Cellulitis of right lower extremity   . Diabetes mellitus type 2, uncontrolled ([place])   . Polyneuropathy in diabetes(357.2) 03/04/2014  . Localized, primary osteoarthritis of shoulder region 11/02/2013  . Chronic diastolic CHF (congestive heart failure) ([place]) 05/02/2013  . Biliary calculi, common bile duct 02/28/2013  . Fungal infection of nail 02/28/2013  . H/O immunosuppressive therapy 02/28/2013  . Long term (current) use of anticoagulants 10/31/2012  . Swelling of limb 10/22/2012  . Pain in limb 10/22/2012  . Lymphedema 10/22/2012  . Atrial fibrillation ([place]) 09/04/2012  . Cellulitis 07/10/2012  . Arteriosclerosis of coronary artery 04/04/2012  . Encounter for long-term (current) use of steroids 04/04/2012  . Diabetes mellitus, type 2 ([place]) 04/04/2012  . HLD (hyperlipidemia) 04/04/2012  . Disease of  liver 04/04/2012  . Heart attack ([place]) 04/04/2012  . Long term current use of systemic steroids 04/04/2012  . Abnormal abdominal MRI 03/03/2012  . Abdominal pain 12/04/2011  . Systolic CHF, chronic ([place]) 09/15/2011  . NSTEMI (non-ST elevated myocardial infarction) ([place]) 08/30/2011  . Systolic CHF, acute ([place]) 08/30/2011  . ACS (acute coronary syndrome) ([place]) 08/29/2011  . Coronary artery disease   . Chronic back pain   . Chronic kidney disease   . Autoimmune liver disease   . Carotid bruit 07/02/2011  . Hypoxemia 11/17/2010  . Palpitations 11/06/2010  . OTHER NONINFECTIOUS DISORDERS LYMPHATIC CHANNELS 07/21/2009  . Diabetes mellitus ([place]) 07/07/2009  . Anemia of chronic disease 07/07/2009  . HTN (hypertension) 07/07/2009  . GERD 07/07/2009  . Autoimmune hepatitis ([place]) 07/07/2009  . DEGENERATIVE DISC DISEASE 07/07/2009  . PSOAS MUSCLE ABSCESS 07/05/2009  . HYPERLIPIDEMIA-MIXED 01/02/2009  . VENOUS INSUFFICIENCY, LEGS 01/02/2009    Orientation ACTIVITIES/SOCIAL BLADDER RESPIRATION    Self, Time, Situation, Place    Continent Normal  BEHAVIORAL SYMPTOMS/MOOD NEUROLOGICAL BOWEL NUTRITION STATUS      Continent Diet (Cardiac)  PHYSICIAN VISITS COMMUNICATION OF NEEDS Height & Weight Skin    Verbally   117 lbs. Normal          AMBULATORY STATUS RESPIRATION    Assist extensive Normal      Personal Care Assistance Level of Assistance  Bathing, Dressing Bathing Assistance: Limited assistance   Dressing Assistance: Limited assistance      Functional Limitations Info                SPECIAL CARE FACTORS  FREQUENCY  PT (By licensed PT)     PT Frequency: 5/wk             Additional Factors Info  Allergies, Insulin Sliding Scale       Insulin Sliding Scale Info: 6x/day       Current Medications (06/09/2015): Current Facility-Administered Medications  Medication Dose Route Frequency Provider Last Rate Last Dose  . acetaminophen (TYLENOL) tablet 650 mg   650 mg Oral Q6H PRN Liberty Handy, MD   650 mg at 06/08/15 2220   Or  . acetaminophen (TYLENOL) suppository 650 mg  650 mg Rectal Q4H PRN Liberty Handy, MD      . apixaban Arne Cleveland) tablet 2.5 mg  2.5 mg Oral BID Lucious Groves, DO   2.5 mg at 06/09/15 0737  . bisoprolol (ZEBETA) tablet 2.5 mg  2.5 mg Oral QHS Jones Bales, MD      . dofetilide [place]) capsule 125 mcg  125 mcg Oral BID Lucious Groves, DO   125 mcg at 06/09/15 0734  . feeding supplement (ENSURE ENLIVE) (ENSURE ENLIVE) liquid 237 mL  237 mL Oral BID BM Campbell Riches, MD   237 mL at 06/09/15 1000  . fentaNYL (SUBLIMAZE) injection 25 mcg  25 mcg Intravenous Once Hoyle Sauer, MD   25 mcg at 06/08/15 1920  . gabapentin (NEURONTIN) capsule 100 mg  100 mg Oral TID Lucious Groves, DO   100 mg at 06/09/15 1062  . insulin aspart (novoLOG) injection 0-15 Units  0-15 Units Subcutaneous 6 times per day Liberty Handy, MD   5 Units at 06/09/15 1238  . LORazepam (ATIVAN) tablet 1 mg  1 mg Oral Q4H PRN Lucious Groves, DO      . morphine (MS CONTIN) 12 hr tablet 30 mg  30 mg Oral BID Liberty Handy, MD   30 mg at 06/09/15 6948  . oxyCODONE (Oxy IR/ROXICODONE) immediate release tablet 5 mg  5 mg Oral BID PRN Lucious Groves, DO      . piperacillin-tazobactam (ZOSYN) IVPB 3.375 g  3.375 g Intravenous 3 times per day Almeta Monas Bajbus, RPH   3.375 g at 06/09/15 0937  . predniSONE (DELTASONE) tablet 20 mg  20 mg Oral QAC breakfast Liberty Handy, MD   20 mg at 06/09/15 0734  . sodium chloride 0.9 % injection 3 mL  3 mL Intravenous Q12H Lucious Groves, DO   3 mL at 06/09/15 0945   Do not use this list as official medication orders. Please verify with discharge summary.  Discharge Medications:   Medication List    ASK your doctor about these medications        alendronate 70 MG tablet  Commonly known as:  FOSAMAX  Take 70 mg by mouth once a week. Take with a full glass of water on an empty stomach.     apixaban 2.5 MG Tabs tablet  Commonly known  as:  ELIQUIS  Take 1 tablet (2.5 mg total) by mouth 2 (two) times daily.     bisoprolol 5 MG tablet  Commonly known as:  ZEBETA  Take 1 tablet (5 mg total) by mouth daily.     docusate sodium 100 MG capsule  Commonly known as:  COLACE  Take 100 mg by mouth at bedtime.     dofetilide 125 MCG capsule  Commonly known as:  TIKOSYN  Take 1 capsule (125 mcg total) by mouth 2 (two) times daily.     ezetimibe 10  MG tablet  Commonly known as:  ZETIA  Take 10 mg by mouth daily.     FISH OIL + D3 PO  Take 1 capsule by mouth 2 (two) times daily.     folic acid 675 MCG tablet  Commonly known as:  FOLVITE  Take 400 mcg by mouth daily.     furosemide 40 MG tablet  Commonly known as:  LASIX  Take 60 mg by mouth at bedtime.     gabapentin 100 MG capsule  Commonly known as:  NEURONTIN  Take 100 mg by mouth 3 (three) times daily.     hydrALAZINE 100 MG tablet  Commonly known as:  APRESOLINE  Take 100 mg by mouth 3 (three) times daily.     LORazepam 1 MG tablet  Commonly known as:  ATIVAN  Take 1 mg by mouth every 4 (four) hours as needed for anxiety.     magnesium oxide 400 (241.3 MG) MG tablet  Commonly known as:  MAG-OX  Take 1 tablet (400 mg total) by mouth daily.     metolazone 2.5 MG tablet  Commonly known as:  ZAROXOLYN  Take 2.5 mg by mouth once a week.     morphine 60 MG 12 hr tablet  Commonly known as:  MS CONTIN  Take 1 tablet by mouth 2 (two) times daily.     nitroGLYCERIN 0.4 MG SL tablet  Commonly known as:  NITROSTAT  Place 1 tablet (0.4 mg total) under the tongue every 5 (five) minutes as needed for chest pain.     omeprazole 20 MG capsule  Commonly known as:  PRILOSEC  Take 20 mg by mouth daily.     oxyCODONE 5 MG immediate release tablet  Commonly known as:  Oxy IR/ROXICODONE  Take 5 mg by mouth 2 (two) times daily as needed for moderate pain.     pravastatin 80 MG tablet  Commonly known as:  PRAVACHOL  Take 1 tablet (80 mg total) by mouth every  evening.     predniSONE 10 MG tablet  Commonly known as:  DELTASONE  Take 10 mg by mouth daily.     ROCEPHIN 1 G injection  Generic drug:  cefTRIAXone  Inject 1 g into the vein daily.     senna 8.6 MG Tabs tablet  Commonly known as:  SENOKOT  Take 1 tablet by mouth daily as needed for mild constipation.     sulfamethoxazole-trimethoprim 800-160 MG tablet  Commonly known as:  BACTRIM DS,SEPTRA DS  TK 1 T PO BID FOR 10 DAYS     vitamin C 1000 MG tablet  Take 1,000 mg by mouth 2 (two) times daily.     VITAMIN D PO  Take 2,000 Units by mouth at bedtime.        Relevant Imaging Results:  Relevant Lab Results:  Recent Labs    Additional Information  Cranford Mon, LCSW

## 2015-06-09 NOTE — Progress Notes (Signed)
ANTIBIOTIC CONSULT NOTE - INITIAL  Pharmacy Consult for Zosyn Indication: sepsis  Allergies  Allergen Reactions  . Cyclosporine     Blasted bone marrow  . Azathioprine     Other reaction(s): Unknown  . Mycophenolate Other (See Comments)    Pre-op note mentions "intolerance" to mycophenolate.  Unknown reaction, unknown severity.  . Other     Other reaction(s): Other (See Comments) Uncoded Allergy. Allergen: Other Allergy: See Patient Chart for Details, Other Reaction: Toxicity  . Oysters [Shellfish Allergy] Nausea And Vomiting    Patient Measurements: Height: 5\' 1"  (154.9 cm) Weight: 117 lb 11.6 oz (53.4 kg) IBW/kg (Calculated) : 47.8   Vital Signs: Temp: 99.1 F (37.3 C) (11/03 0700) Temp Source: Rectal (11/03 0700) BP: 97/48 mmHg (11/03 0700) Pulse Rate: 81 (11/03 0700) Intake/Output from previous day: 11/02 0701 - 11/03 0700 In: 1250 [I.V.:1250] Out: -  Intake/Output from this shift: Total I/O In: 120 [P.O.:120] Out: -   Labs:  Recent Labs  06/08/15 1534  WBC 23.5*  HGB 10.6*  PLT 318  CREATININE 1.23*   Estimated Creatinine Clearance: 28 mL/min (by C-G formula based on Cr of 1.23).  Recent Labs  06/08/15 1534  VANCORANDOM <0     Microbiology: Recent Results (from the past 720 hour(s))  Culture, blood (routine x 2)     Status: None (Preliminary result)   Collection Time: 06/08/15  3:55 PM  Result Value Ref Range Status   Specimen Description BLOOD RIGHT HAND  Final   Special Requests IN PEDIATRIC BOTTLE 2CC  Final   Culture PENDING  Incomplete   Report Status PENDING  Incomplete  Urine culture     Status: None (Preliminary result)   Collection Time: 06/08/15  4:12 PM  Result Value Ref Range Status   Specimen Description URINE, RANDOM  Final   Special Requests NONE  Final   Culture NO GROWTH < 24 HOURS  Final   Report Status PENDING  Incomplete  MRSA PCR Screening     Status: None   Collection Time: 06/08/15 10:36 PM  Result Value Ref Range  Status   MRSA by PCR NEGATIVE NEGATIVE Final    Comment:        The GeneXpert MRSA Assay (FDA approved for NASAL specimens only), is one component of a comprehensive MRSA colonization surveillance program. It is not intended to diagnose MRSA infection nor to guide or monitor treatment for MRSA infections.     Assessment: 23 YOF admitted with AMS from [facility]. She was admitted there after a stay at [facility] with cellulitis- per the H&P, this was MRSA cellulitis and the patient was on ceftriaxone via PICC line at the [facility]. She was febrile in the ED along with tachypnea and tachycardia and an elevated lactic acid which improved with fluid.  Now LA 1.2, WBC 23.5, afebrile at the moment (last received APAP at 2220 last evening). SCr 1.23, est CrCL ~25-43mL/min.  11/2 BCx: sent 11/2 urine: NG <24h  Goal of Therapy:  eradication of infection  Plan:  -Zosyn 3.375g IV q8h EI -monitor c/s, clinical picture, renal function, LOT -follow need to add any other antibiotics  Cynthia Hicks, PharmD, BCPS Clinical Pharmacist Pager: [phone] 06/09/2015 8:18 AM

## 2015-06-09 NOTE — Clinical Social Work Placement (Signed)
   CLINICAL SOCIAL WORK PLACEMENT  NOTE  Date:  06/09/2015  Patient Details  Name: Cynthia Hicks MRN: [id number] Date of Birth: Nov 12, 1932  Clinical Social Work is seeking post-discharge placement for this patient at the [place] level of care (*CSW will initial, date and re-position this form in  chart as items are completed):  Yes   Patient/family provided with [place] Work Department's list of facilities offering this level of care within the geographic area requested by the patient (or if unable, by the patient's family).  Yes   Patient/family informed of their freedom to choose among providers that offer the needed level of care, that participate in Medicare, Medicaid or managed care program needed by the patient, have an available bed and are willing to accept the patient.  Yes   Patient/family informed of [place]'s ownership interest in [facility] and [facility], as well as of the fact that they are under no obligation to receive care at these facilities.  PASRR submitted to EDS on       PASRR number received on       Existing PASRR number confirmed on 06/09/15     FL2 transmitted to all facilities in geographic area requested by pt/family on 06/09/15     FL2 transmitted to all facilities within larger geographic area on       Patient informed that his/her managed care company has contracts with or will negotiate with certain facilities, including the following:            Patient/family informed of bed offers received.  Patient chooses bed at       Physician recommends and patient chooses bed at      Patient to be transferred to   on  .  Patient to be transferred to facility by       Patient family notified on   of transfer.  Name of family member notified:        PHYSICIAN Please sign FL2     Additional Comment:    _______________________________________________ Cranford Mon, LCSW 06/09/2015, 1:16  PM

## 2015-06-10 ENCOUNTER — Inpatient Hospital Stay (HOSPITAL_COMMUNITY): Payer: Medicare Other

## 2015-06-10 DIAGNOSIS — N189 Chronic kidney disease, unspecified: Secondary | ICD-10-CM

## 2015-06-10 DIAGNOSIS — R1114 Bilious vomiting: Secondary | ICD-10-CM

## 2015-06-10 DIAGNOSIS — R05 Cough: Secondary | ICD-10-CM

## 2015-06-10 DIAGNOSIS — E1122 Type 2 diabetes mellitus with diabetic chronic kidney disease: Secondary | ICD-10-CM

## 2015-06-10 DIAGNOSIS — R509 Fever, unspecified: Secondary | ICD-10-CM

## 2015-06-10 DIAGNOSIS — S81802A Unspecified open wound, left lower leg, initial encounter: Secondary | ICD-10-CM

## 2015-06-10 DIAGNOSIS — Z7952 Long term (current) use of systemic steroids: Secondary | ICD-10-CM

## 2015-06-10 DIAGNOSIS — L89109 Pressure ulcer of unspecified part of back, unspecified stage: Secondary | ICD-10-CM

## 2015-06-10 LAB — CBC WITH DIFFERENTIAL/PLATELET
BASOS PCT: 0 %
Basophils Absolute: 0 10*3/uL (ref 0.0–0.1)
EOS ABS: 0.1 10*3/uL (ref 0.0–0.7)
EOS PCT: 0 %
HCT: 27.1 % — ABNORMAL LOW (ref 36.0–46.0)
Hemoglobin: 8.5 g/dL — ABNORMAL LOW (ref 12.0–15.0)
LYMPHS ABS: 1.4 10*3/uL (ref 0.7–4.0)
Lymphocytes Relative: 10 %
MCH: 30.5 pg (ref 26.0–34.0)
MCHC: 31.4 g/dL (ref 30.0–36.0)
MCV: 97.1 fL (ref 78.0–100.0)
MONOS PCT: 4 %
Monocytes Absolute: 0.6 10*3/uL (ref 0.1–1.0)
Neutro Abs: 11.4 10*3/uL — ABNORMAL HIGH (ref 1.7–7.7)
Neutrophils Relative %: 85 %
PLATELETS: 271 10*3/uL (ref 150–400)
RBC: 2.79 MIL/uL — AB (ref 3.87–5.11)
RDW: 19.1 % — ABNORMAL HIGH (ref 11.5–15.5)
WBC: 13.4 10*3/uL — AB (ref 4.0–10.5)

## 2015-06-10 LAB — MAGNESIUM: Magnesium: 1.5 mg/dL — ABNORMAL LOW (ref 1.7–2.4)

## 2015-06-10 LAB — GLUCOSE, CAPILLARY
GLUCOSE-CAPILLARY: 137 mg/dL — AB (ref 65–99)
GLUCOSE-CAPILLARY: 325 mg/dL — AB (ref 65–99)
GLUCOSE-CAPILLARY: 219 mg/dL — AB (ref 65–99)
Glucose-Capillary: 147 mg/dL — ABNORMAL HIGH (ref 65–99)
Glucose-Capillary: 232 mg/dL — ABNORMAL HIGH (ref 65–99)

## 2015-06-10 LAB — BASIC METABOLIC PANEL
ANION GAP: 9 (ref 5–15)
BUN: 21 mg/dL — ABNORMAL HIGH (ref 6–20)
CALCIUM: 8.2 mg/dL — AB (ref 8.9–10.3)
CO2: 30 mmol/L (ref 22–32)
CREATININE: 1.39 mg/dL — AB (ref 0.44–1.00)
Chloride: 102 mmol/L (ref 101–111)
GFR, EST AFRICAN AMERICAN: 41 mL/min — AB (ref >60)
GFR, EST NON AFRICAN AMERICAN: 35 mL/min — AB (ref >60)
Glucose, Bld: 138 mg/dL — ABNORMAL HIGH (ref 65–99)
Potassium: 3.5 mmol/L (ref 3.5–5.1)
Sodium: 141 mmol/L (ref 135–145)

## 2015-06-10 LAB — EXPECTORATED SPUTUM ASSESSMENT W REFEX TO RESP CULTURE

## 2015-06-10 LAB — EXPECTORATED SPUTUM ASSESSMENT W GRAM STAIN, RFLX TO RESP C

## 2015-06-10 LAB — HEMOGLOBIN A1C
Hgb A1c MFr Bld: 7.3 % — ABNORMAL HIGH (ref 4.8–5.6)
MEAN PLASMA GLUCOSE: 163 mg/dL

## 2015-06-10 MED ORDER — BOOST / RESOURCE BREEZE PO LIQD: 1.0000 | Freq: Three times a day (TID) | ORAL | Status: DC

## 2015-06-10 MED ORDER — VANCOMYCIN HCL IN DEXTROSE 1-5 GM/200ML-% IV SOLN
1000.0000 mg | INTRAVENOUS | Status: DC
  Administered 2015-06-10 – 2015-06-12 (×2): 1000 mg via INTRAVENOUS
  Filled 2015-06-10 (×3): qty 200

## 2015-06-10 MED ORDER — MAGNESIUM SULFATE 2 GM/50ML IV SOLN
2.0000 g | Freq: Once | INTRAVENOUS | Status: AC
  Administered 2015-06-10: 2 g via INTRAVENOUS
  Filled 2015-06-10: qty 50

## 2015-06-10 MED ORDER — POTASSIUM CHLORIDE CRYS ER 20 MEQ PO TBCR
40.0000 meq | EXTENDED_RELEASE_TABLET | Freq: Two times a day (BID) | ORAL | Status: AC
  Administered 2015-06-10 (×2): 40 meq via ORAL
  Filled 2015-06-10 (×2): qty 2

## 2015-06-10 MED ORDER — VANCOMYCIN HCL IN DEXTROSE 1-5 GM/200ML-% IV SOLN: 1000.0000 mg | Freq: Once | INTRAVENOUS | Status: DC

## 2015-06-10 NOTE — Progress Notes (Signed)
VASCULAR LAB PRELIMINARY  ARTERIAL  ABI completed:    RIGHT    LEFT    PRESSURE WAVEFORM  PRESSURE WAVEFORM  BRACHIAL 106 Triphasic BRACHIAL Not obtained Due to PICC line placement   DP >254  Biphasic DP 93 Biphasic  PT 145 Monophasic PT 102 Monophasic    RIGHT LEFT  ABI N/A 0.96   Right ABI not ascertained due to Non compressible vessel. Left ABI indicates normal arterial flow however Abnormal Doppler waveforms may suggest a false elevation on pressures.  Jameire Kouba, RVS 06/10/2015, 2:51 PM

## 2015-06-10 NOTE — Progress Notes (Addendum)
Inpatient Diabetes Program Recommendations  AACE/ADA: New Consensus Statement on Inpatient Glycemic Control (2015)  Target Ranges:  Prepandial:   less than 140 mg/dL      Peak postprandial:   less than 180 mg/dL (1-2 hours)      Critically ill patients:  140 - 180 mg/dL    Results for ARELIA, VOLPE (MRN [id number]) as of 06/10/2015 13:14  Ref. Range 06/09/2015 00:22 06/09/2015 04:26 06/09/2015 07:53 06/09/2015 11:51 06/09/2015 11:58 06/09/2015 16:39 06/09/2015 20:46  Glucose-Capillary Latest Ref Range: 65-99 mg/dL 179 (H) 191 (H) 193 (H) 322 (H) 244 (H) 293 (H) 205 (H)    Results for CHARNETTA, WULFF (MRN [id number]) as of 06/10/2015 13:14  Ref. Range 06/09/2015 23:25 06/10/2015 03:29 06/10/2015 08:26 06/10/2015 12:37  Glucose-Capillary Latest Ref Range: 65-99 mg/dL 166 (H) 147 (H) 137 (H) 232 (H)    Home DM Meds: None  Current Insulin Orders: Novolog Moderate SSI (0-15 units) Q4 hours    -Patient currently getting Prednisone 20 mg daily.  -Having elevated postprandial glucose levels.    MD- Please consider starting Novolog Meal Coverage- Novolog 3 units tid with meals  Please also change Novolog Moderate SSI to TID AC + HS (currently ordered as Q4 hours)    --Will follow patient during hospitalization--  Wyn Quaker RN, MSN, CDE Diabetes Coordinator Inpatient Glycemic Control Team Team Pager: [phone] (8a-5p)

## 2015-06-10 NOTE — Progress Notes (Signed)
Subjective: Patient seen and examined this morning on rounds.  No acute events overnight.  Had abdominal ultrasound this morning after reporting bilious vomiting x 5 prior to admission.  No vomiting since admit.  She does report a productive cough.  No fever, chills, shortness of breath.  No chest pain.  Objective: Vital signs in last 24 hours: Filed Vitals:   06/10/15 0000 06/10/15 0400 06/10/15 0437 06/10/15 0800  BP: 114/46   120/52  Pulse: 74   75  Temp: 98 F (36.7 C) 98.2 F (36.8 C)  98.3 F (36.8 C)  TempSrc: Oral Oral  Oral  Resp: 24   15  Height:      Weight:   121 lb 0.5 oz (54.9 kg)   SpO2: 95%   95%   Weight change: 3 lb 1.4 oz (1.4 kg)  Intake/Output Summary (Last 24 hours) at 06/10/15 1203 Last data filed at 06/10/15 1033  Gross per 24 hour  Intake    349 ml  Output    400 ml  Net    -51 ml   General: sitting up in chair, no distress HEENT: EOMI, no scleral icterus Cardiac: Regular rate, irregular rhythm, no rubs, murmurs or gallops Pulm: clear to auscultation bilaterally, poor inspiratory effort Abdomen: BS present, tender to palpation of RUQ, no rebound or guarding Ext: Healing ulcer on lateral aspect of left foot, erythema at mid-tibial region relatively unchanged.  She does have a raised, non-fluctuant nodule on proximal left tibia that patient attributes to injury sustained at SNF when food tray fell on her leg.  2+ edema present bilaterally in lower extremities.  Dorsalis pedis pulses intact. Neuro: alert and oriented X3, cranial nerves II-XII grossly intact  Lab Results: Basic Metabolic Panel:  Recent Labs Lab 06/08/15 1534 06/10/15 0449  NA 141 141  K 3.6 3.5  CL 98* 102  CO2 28 30  GLUCOSE 221* 138*  BUN 13 21*  CREATININE 1.23* 1.39*  CALCIUM 9.3 8.2*  MG  --  1.5*   Liver Function Tests:  Recent Labs Lab 06/08/15 1534  AST 24  ALT 13*  ALKPHOS 83  BILITOT 0.7  PROT 5.9*  ALBUMIN 2.4*   No results for input(s): LIPASE,  AMYLASE in the last 168 hours. No results for input(s): AMMONIA in the last 168 hours. CBC:  Recent Labs Lab 06/08/15 1534 06/09/15 1119 06/10/15 0449  WBC 23.5* 21.4* 13.4*  NEUTROABS 19.1*  --  11.4*  HGB 10.6* 9.2* 8.5*  HCT 33.8* 30.3* 27.1*  MCV 94.9 96.5 97.1  PLT 318 265 271   Cardiac Enzymes: No results for input(s): CKTOTAL, CKMB, CKMBINDEX, TROPONINI in the last 168 hours. BNP: No results for input(s): PROBNP in the last 168 hours. D-Dimer: No results for input(s): DDIMER in the last 168 hours. CBG:  Recent Labs Lab 06/09/15 1158 06/09/15 1639 06/09/15 2046 06/09/15 2325 06/10/15 0329 06/10/15 0826  GLUCAP 244* 293* 205* 166* 147* 137*   Hemoglobin A1C:  Recent Labs Lab 06/09/15 0035  HGBA1C 7.3*   Fasting Lipid Panel: No results for input(s): CHOL, HDL, LDLCALC, TRIG, CHOLHDL, LDLDIRECT in the last 168 hours. Thyroid Function Tests: No results for input(s): TSH, T4TOTAL, FREET4, T3FREE, THYROIDAB in the last 168 hours. Coagulation: No results for input(s): LABPROT, INR in the last 168 hours. Anemia Panel: No results for input(s): VITAMINB12, FOLATE, FERRITIN, TIBC, IRON, RETICCTPCT in the last 168 hours. Urine Drug Screen: Drugs of Abuse  No results found for: LABOPIA, COCAINSCRNUR, LABBENZ,  AMPHETMU, THCU, LABBARB  Alcohol Level: No results for input(s): ETH in the last 168 hours. Urinalysis:  Recent Labs Lab 06/08/15 1612  COLORURINE YELLOW  LABSPEC 1.012  PHURINE 7.0  GLUCOSEU 100*  HGBUR NEGATIVE  BILIRUBINUR NEGATIVE  KETONESUR NEGATIVE  PROTEINUR NEGATIVE  UROBILINOGEN 0.2  NITRITE NEGATIVE  LEUKOCYTESUR NEGATIVE   Misc. Labs: none  Micro Results: Recent Results (from the past 240 hour(s))  Culture, blood (routine x 2)     Status: None (Preliminary result)   Collection Time: 06/08/15  3:55 PM  Result Value Ref Range Status   Specimen Description BLOOD RIGHT HAND  Final   Special Requests IN PEDIATRIC BOTTLE 2CC  Final    Culture NO GROWTH < 24 HOURS  Final   Report Status PENDING  Incomplete  Culture, blood (routine x 2)     Status: None (Preliminary result)   Collection Time: 06/08/15  3:59 PM  Result Value Ref Range Status   Specimen Description BLOOD RIGHT ARM  Final   Special Requests BOTTLES DRAWN AEROBIC AND ANAEROBIC 5CC  Final   Culture NO GROWTH < 24 HOURS  Final   Report Status PENDING  Incomplete  Urine culture     Status: None   Collection Time: 06/08/15  4:12 PM  Result Value Ref Range Status   Specimen Description URINE, RANDOM  Final   Special Requests NONE  Final   Culture 6,000 COLONIES/mL INSIGNIFICANT GROWTH  Final   Report Status 06/09/2015 FINAL  Final  MRSA PCR Screening     Status: None   Collection Time: 06/08/15 10:36 PM  Result Value Ref Range Status   MRSA by PCR NEGATIVE NEGATIVE Final    Comment:        The GeneXpert MRSA Assay (FDA approved for NASAL specimens only), is one component of a comprehensive MRSA colonization surveillance program. It is not intended to diagnose MRSA infection nor to guide or monitor treatment for MRSA infections.   Culture, expectorated sputum-assessment     Status: None   Collection Time: 06/10/15 11:20 AM  Result Value Ref Range Status   Specimen Description SPUTUM  Final   Special Requests Immunocompromised  Final   Sputum evaluation   Final    THIS SPECIMEN IS ACCEPTABLE. RESPIRATORY CULTURE REPORT TO FOLLOW.   Report Status 06/10/2015 FINAL  Final   Studies/Results: Dg Chest 2 View  06/10/2015  CLINICAL DATA:  81 year old female with productive cough and altered mental status EXAM: CHEST  2 VIEW COMPARISON:  Prior chest x-ray 06/08/2015 FINDINGS: Stable position of left upper extremity PICC with the catheter tip overlying the distal SVC. Cardiac and mediastinal contours remain within normal limits. Atherosclerotic calcifications again noted in the transverse aorta. Chronic elevation of the right hemidiaphragm. Mild left lower  lobe subsegmental atelectasis. No focal airspace consolidation, pulmonary edema, pleural effusion or pneumothorax. Remote right proximal humerus fracture. No acute osseous abnormality. IMPRESSION: 1. Stable chest x-ray without evidence of acute cardiopulmonary process. 2. Chronic elevation of the right hemidiaphragm. Electronically Signed   By: Jacqulynn Cadet M.D.   On: 06/10/2015 10:31   Dg Tibia/fibula Left  06/08/2015  CLINICAL DATA:  81 year old with edema and erythema of the left lower leg and foot which began approximately 1 week ago. EXAM: LEFT TIBIA AND FIBULA - 2 VIEW COMPARISON:  No prior tibia-fibula imaging. Left foot CT 05/31/2015. Left knee MRI 11/09/2013. FINDINGS: No evidence of acute, subacute or healed fractures. Mild osseous demineralization. 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 No new intrinsic osseous abnormality. IMPRESSION: 1. No acute or subacute osseous abnormality. 2. 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 Electronically Signed   By: Evangeline Dakin M.D.   On: 06/08/2015 17:13   US Abdomen Complete  06/10/2015  CLINICAL DATA:  Gallstones EXAM: ULTRASOUND ABDOMEN COMPLETE COMPARISON:  10/26/ 16 FINDINGS: Gallbladder: Gallstones are again noted within gallbladder. The largest measures 1 cm. No thickening of gallbladder wall. No sonographic Murphy's sign. Common bile duct: Diameter: 2 mm in diameter Liver: No focal lesion identified. Within normal limits in parenchymal echogenicity. IVC: No abnormality visualized. Pancreas: Visualized portion unremarkable. Spleen: Size and appearance within normal limits. Measures 8 cm in length. Right Kidney: Length: 7.7 cm. Mild increased echogenicity probable due to atrophy. No hydronephrosis. No renal calculi. Left Kidney: Length: 8.9 cm in length. Echogenicity within normal limits. No mass or hydronephrosis visualized.  Abdominal aorta: No aneurysm visualized. Measures up to 1.7 cm in diameter. Other findings: None. IMPRESSION: 1. Gallstones are noted within gallbladder the largest measures 1 cm. No thickening of gallbladder wall. No sonographic Murphy's sign. 2. Normal CBD. 3. No hydronephrosis. 4. No aortic aneurysm. Electronically Signed   By: Lahoma Crocker M.D.   On: 06/10/2015 09:59   Dg Chest Port 1 View  06/08/2015  CLINICAL DATA:  81 year old female status post PICC placement. History of MR ASA infection. EXAM: PORTABLE CHEST 1 VIEW COMPARISON:  Chest x-ray 05/31/2015. FINDINGS: New left upper extremity PICC with tip terminating the distal superior vena cava. Chronically elevated right hemidiaphragm. Opacities at the right lung base likely reflect atelectasis and some chronic scarring. Possible small right pleural effusion (likely chronic). Left lung appears clear. No pleural or church and no evidence of pulmonary edema. Heart size is mildly enlarged. No pneumothorax. Upper mediastinal contours are within normal limits. Mild bilateral apical pleuroparenchymal thickening and calcification, most compatible with chronic post infectious or inflammatory scarring. Atherosclerosis in the thoracic aorta. IMPRESSION: 1. Tip of PICC is in the distal superior vena cava. 2. No pneumothorax. 3. Chronically elevated right hemidiaphragm with probable right basilar atelectasis and/or scarring, similar to prior studies. 4. Mild cardiomegaly. 5. Atherosclerosis. Electronically Signed   By: Vinnie Langton M.D.   On: 06/08/2015 15:35   Dg Foot 2 Views Left  06/08/2015  CLINICAL DATA:  81 year old female with redness and swelling of the left foot and left tibia/ fibula. Wound on the lateral aspect of the left foot. EXAM: LEFT FOOT - 2 VIEW COMPARISON:  No priors. FINDINGS: Mild osteopenia. No focal aggressive areas of osteolysis identified. No acute displaced fractures. Multifocal degenerative changes of osteoarthritis. Numerous vascular  calcifications. Soft tissue swelling overlying the forefoot. IMPRESSION: 1. No acute osseous abnormality in the left foot. 2. Diffuse soft tissue swelling most pronounced over the forefoot. 3. Atherosclerosis. 4. Mild osteopenia. Electronically Signed   By: Vinnie Langton M.D.   On: 06/08/2015 17:12   Medications:  Scheduled Meds: . apixaban  2.5 mg Oral BID  . bisoprolol  2.5 mg Oral QHS  . dofetilide  125 mcg Oral BID  . feeding supplement (GLUCERNA SHAKE)  237 mL Oral BID BM  . fentaNYL (SUBLIMAZE) injection  25 mcg Intravenous Once  . gabapentin  100 mg Oral TID  . insulin aspart  0-15 Units Subcutaneous 6 times per day  . morphine  30 mg Oral BID  . piperacillin-tazobactam (ZOSYN)  IV  3.375 g Intravenous 3 times per day  . predniSONE  20 mg Oral QAC breakfast  . sodium chloride  3 mL Intravenous Q12H  . vancomycin  1,000 mg Intravenous Q48H   Continuous Infusions:  PRN Meds:.acetaminophen **OR** acetaminophen, LORazepam, oxyCODONE Assessment/Plan: Active Problems:   Anemia of chronic disease   HTN (hypertension)   Atrial fibrillation (HCC)   Chronic diastolic CHF (congestive heart failure) ([place])   Diabetes mellitus, type 2 ([place])   Long term current use of systemic steroids   CKD (chronic kidney disease), stage III   Sepsis ([place])   Sepsis due to methicillin resistant Staphylococcus aureus ([place])   PICC (peripherally inserted central catheter) in place  Sepsis/LLE cellulitis: 4/4 SIRS criteria with suspected source of infection her current cellulitis on admission.  Patient had been receiving ceftriaxone for MSSA bacteremia likely due to her cellulitis.  Foot cultures from [facility] grew MSSA and Capnocytophaga species.  Blood cultures from [place] grew MSSA. - Continue broad coverage with Vancomycin and Zosyn [ ]  Blood cultures x 2 NGTD - Urine culture w/ insignificant growth [ ]  ABIs pending - WBC trending down to 13.4 today - Tmax last 24 hours 98.3  Bilious  emesis: patient has history of gallstones and reports 5 episodes of emesis prior to admission - Abdominal ultrasound to assess for cellulitis given presentation of abdominal pain, emesis, fever, and elevated WBC with gallstones noted (largest 1cm).  No thickening of the gallbladder wall.  No sonographic Murphys sign.  Normal CBD.  Cough w/ productive sputum [ ]  Sputum culture - CXR without evidence of pneumonia  Atrial Fibrillation with RVR - Apixipan 2.5 mg BID - Dofetilide 125 mcg BID  Autoimmune Hepatitis - Double prednisone 10 mg daily to 20 mg in setting of acute illness.  CBGs have been ok on increased steroids  Chronic Shoulder and Lower Back Pain: She is on a chronic pain regimen of scheduled MS Contin 60 mg BID and oxycodone IR 5 mg BID - Continue decreased dose MS Contin to 30 mg BID - Oxycodone 5 mg IR PRN - Gabapentin 100 mg TID  T2DM - Moderate SSI  Diastolic CHF: TEE 76/5465 showed EF 60-65% with grade 2 diastolic dysfunction. Edema noted on exam, but could be related to venous stasis. She and her son deny that she has had any shortness of breath. - Bisoprolol 5 mg daily  Diet: Heart Healthy  DVT Prophylaxis: Eliquis  Code Status: Full  Dispo: Disposition is deferred at this time, awaiting improvement of current medical problems.    The patient does have a current PCP (Imran Gertie Baron, MD) and does not need an [facility] hospital follow-up appointment after discharge.  The patient does not have transportation limitations that hinder transportation to clinic appointments.  .Services Needed at time of discharge: Y = Yes, Blank = No PT:   OT:   RN:   Equipment:   Other:     LOS: 2 days   Jule Ser, DO 06/10/2015, 12:03 PM

## 2015-06-10 NOTE — Consult Note (Addendum)
WOC wound consult note Reason for Consult: Consult requested for sacrum.  Pt was noted to have a stage 2 wound to sacrum on 11/2 in the ER notes.  Pt states "it has been there awhile." Wound type: Stage 2 pressure injury; 1X1X.1cm, red and moist, small amt yellow drainage, no odor Pressure Ulcer POA: Yes Middle back with protruding spinal bone; appearance consistent with kyphosis,  difficult to reduce pressure to the affected area when she is lying in bed.   Measurement: Stage 1 pressure injury over middle back, 5X1cm, red and nonblanchable, no open wound or drainage Dressing procedure/placement/frequency: Foam dressing to protect sacrum and middle back and promote healing.  Discussed plan of care with patient and she verbalizes understanding. Please re-consult if further assistance is needed.  Thank-you,  Julien Girt MSN, [place], [place], [place], [place]

## 2015-06-10 NOTE — Progress Notes (Signed)
Initial Nutrition Assessment  DOCUMENTATION CODES:   Not applicable  INTERVENTION:   Boost Breeze po TID, each supplement provides 250 kcal and 9 grams of protein  NUTRITION DIAGNOSIS:   Increased nutrient needs related to acute illness as evidenced by estimated needs   GOAL:   Patient will meet greater than or equal to 90% of their needs  MONITOR:   PO intake, Supplement acceptance, Labs, Weight trends, I & O's  REASON FOR ASSESSMENT:   Malnutrition Screening Tool  ASSESSMENT:   81 y.o. Female with PMH of CAD, DM, arthritis, DM; admitted with AMS.  Patient currently in [place].  Per Malnutrition Screening Tool Report, pt with recent unintentional weight loss.  Per weight readings, pt has had an approximate 8% weight loss since August 2016 which is significant for time frame.  Currently on a Heart Healthy diet.  No % PO intake records available.  + bilious vomiting this AM.  RD unable to complete Nutrition Focused Physical Exam at this time.  Diet Order:  Diet Heart Room service appropriate?: Yes; Fluid consistency:: Thin  Skin:  Reviewed, no issues  Last BM:  11/1  Height:   Ht Readings from Last 1 Encounters:  06/08/15 5\' 1"  (1.549 m)    Weight:   Wt Readings from Last 1 Encounters:  06/10/15 121 lb 0.5 oz (54.9 kg)    Ideal Body Weight:  47.7 kg  BMI:  Body mass index is 22.88 kg/(m^2).  Estimated Nutritional Needs:   Kcal:  1400-1600  Protein:  60-70 gm  Fluid:  >/= 1.5 L  EDUCATION NEEDS:   No education needs identified at this time  Cynthia Hicks, RD, LDN Pager #: [phone] After-Hours Pager #: [phone]

## 2015-06-10 NOTE — Progress Notes (Signed)
ANTIBIOTIC CONSULT NOTE - FOLLOW UP  Pharmacy Consult for Zosyn, + adding Vancomycin Indication: sepsis  Allergies  Allergen Reactions  . Cyclosporine     Blasted bone marrow  . Azathioprine     Other reaction(s): Unknown  . Mycophenolate Other (See Comments)    Pre-op note mentions "intolerance" to mycophenolate.  Unknown reaction, unknown severity.  . Other     Other reaction(s): Other (See Comments) Uncoded Allergy. Allergen: Other Allergy: See Patient Chart for Details, Other Reaction: Toxicity  . Oysters [Shellfish Allergy] Nausea And Vomiting    Patient Measurements: Height: 5\' 1"  (154.9 cm) Weight: 121 lb 0.5 oz (54.9 kg) IBW/kg (Calculated) : 47.8   Vital Signs: Temp: 98.2 F (36.8 C) (11/04 0400) Temp Source: Oral (11/04 0400) BP: 114/46 mmHg (11/04 0000) Pulse Rate: 74 (11/04 0000) Intake/Output from previous day: 11/03 0701 - 11/04 0700 In: 516 [P.O.:360; I.V.:6; IV Piggyback:150] Out: 400 [Urine:400] Intake/Output from this shift:    Labs:  Recent Labs  06/08/15 1534 06/09/15 1119 06/10/15 0449  WBC 23.5* 21.4* 13.4*  HGB 10.6* 9.2* 8.5*  PLT 318 265 271  CREATININE 1.23*  --  1.39*   Estimated Creatinine Clearance: 24.8 mL/min (by C-G formula based on Cr of 1.39).  Recent Labs  06/08/15 1534  VANCORANDOM <4     Microbiology: Recent Results (from the past 720 hour(s))  Culture, blood (routine x 2)     Status: None (Preliminary result)   Collection Time: 06/08/15  3:55 PM  Result Value Ref Range Status   Specimen Description BLOOD RIGHT HAND  Final   Special Requests IN PEDIATRIC BOTTLE 2CC  Final   Culture NO GROWTH < 24 HOURS  Final   Report Status PENDING  Incomplete  Culture, blood (routine x 2)     Status: None (Preliminary result)   Collection Time: 06/08/15  3:59 PM  Result Value Ref Range Status   Specimen Description BLOOD RIGHT ARM  Final   Special Requests BOTTLES DRAWN AEROBIC AND ANAEROBIC 5CC  Final   Culture NO GROWTH <  24 HOURS  Final   Report Status PENDING  Incomplete  Urine culture     Status: None   Collection Time: 06/08/15  4:12 PM  Result Value Ref Range Status   Specimen Description URINE, RANDOM  Final   Special Requests NONE  Final   Culture 6,000 COLONIES/mL INSIGNIFICANT GROWTH  Final   Report Status 06/09/2015 FINAL  Final  MRSA PCR Screening     Status: None   Collection Time: 06/08/15 10:36 PM  Result Value Ref Range Status   MRSA by PCR NEGATIVE NEGATIVE Final    Comment:        The GeneXpert MRSA Assay (FDA approved for NASAL specimens only), is one component of a comprehensive MRSA colonization surveillance program. It is not intended to diagnose MRSA infection nor to guide or monitor treatment for MRSA infections.     Assessment: 28 YOF admitted with AMS from [facility]. She was admitted there after a stay at [facility] with cellulitis- per the H&P, this was MRSA cellulitis and the patient was on ceftriaxone via PICC line at the [facility]. Currently on IV Zosyn and adding Vancomycin to cover cellulitis. WBC 13.5. Afebrile   SCr 1.39, est CrCL ~20-44mL/min.  11/2 BCx: sent 11/2 urine: NG <24h  Goal of Therapy:  eradication of infection  Plan:  -Continue Zosyn 3.375g IV q8h EI -Resume Vancomycin at 1 gm IV Q 48 hours  -  monitor c/s, clinical picture, renal function, LOT -follow need to add any other antibiotics  Albertina Parr, PharmD., BCPS Clinical Pharmacist Pager [phone]

## 2015-06-10 NOTE — Progress Notes (Addendum)
  Date: 06/10/2015  Patient name: Cynthia Hicks  Medical record number: [id number]  Date of birth: 04-08-1933   This patient's plan of care was discussed with the house staff. Please see their note for complete details. I concur with their findings. 1. Bilious Emesis Will check abd u/s to eval for gall stones, cholecystitis  2. Cough, fever Will f/u her CXR now that she is hydrated.  It is possible she may have aspirated. Continue vanco/zosyn  3. Recent  LE cellulitis (Wound Cx MSSA and capnocytophaga, BCx MSSA) Will check ABIs BCx are ngtd Continue vanco/zosyn  4. CRI Her Cr is stable.   5. Autoimmune hepatitis On prednisone  6. Decubitus on back Will get mattress overlay Wound care eval  Campbell Riches, MD 06/10/2015, 11:38 AM

## 2015-06-10 NOTE — Progress Notes (Signed)
CSW provided pt and sons with bed offers- they will review facilities and make decision accordingly  CSW will continue to follow  Domenica Reamer, [place] Social Worker [phone]

## 2015-06-10 NOTE — Evaluation (Signed)
Physical Therapy Evaluation Patient Details Name: Cynthia Hicks MRN: [id number] DOB: 07/29/1933 Today's Date: 06/10/2015   History of Present Illness  Cynthia Hicks a 81 year old woman with a past medical history of atrial fibrillation on apixibanand dofetilide (CHA2DS2VASc 6), CAD (NSTEMI x2 '07, '13, s/p DES), autoimmune hepatitis (on prednisone), ischemic cardiomyopathy (11/15 TEE 55-65%) who presents with altered mental status from [facility]. Patient was lethargic during the encounter, so history was primarily obtained from Cynthia Hicks and chart review; however, she was able to describe some her current symptoms. According to her patient transfer form, she had been admitted to [facility] from 10/25 to 10/31 for treatment of a MRSA cellulitis of her left foot. She had been discharged to [facility] on 10/31, where she had continued to receive ceftriaxone via PICC for this infection. A request was made to obtain records from [facility], and multiple calls were made to [facility] in attempts to obtain more history, blood culture results, and antibiotic sensitivities, but hitherto the latter has not responded. Her Cynthia, Garwin Hicks, had said that he had visited her at [facility] last night and she appeared more tired and had been complaining of nausea and her "voice had changed." According to her transfer form, she had endorsed some bowel and bladder incontinence, and the minimal history we did obtain from Cynthia Hicks suggested that she had been moved to the bedpan at her facility and "left there for hours."  Clinical Impression  Pt admitted with above diagnosis. Pt currently with functional limitations due to the deficits listed below (see PT Problem List). Pt was able to ambulate with RW but poor endurance for activity and poor balance continues.  Pt needs SNF to get stronger so she can function at home.  Pt and Cynthia would like [facility] in [place] if they have a bed.   Pt also will benefit from OT for adaptive equipment as she has bil shoulder arthritis.  Will follow acutely.  Pt will benefit from skilled PT to increase their independence and safety with mobility to allow discharge to the venue listed below.      Follow Up Recommendations SNF;Supervision/Assistance - 24 hour    Equipment Recommendations  None recommended by PT    Recommendations for Other Services OT consult (needs adaptive equipment)     Precautions / Restrictions Precautions Precautions: Fall Restrictions Weight Bearing Restrictions: No      Mobility  Bed Mobility Overal bed mobility: Needs Assistance;+2 for physical assistance Bed Mobility: Supine to Sit     Supine to sit: Mod assist;+2 for physical assistance     General bed mobility comments: needed assist for LEs as well as for elevation of trunk.    Transfers Overall transfer level: Needs assistance Equipment used: Rolling walker (2 wheeled) Transfers: Sit to/from Stand Sit to Stand: Mod assist;+2 physical assistance;From elevated surface         General transfer comment: Pt needed assist to power up.  Pt needed assist to steady herself once up with slighlty flexed posture throughout.   Ambulation/Gait Ambulation/Gait assistance: Min assist;Mod assist;+2 safety/equipment Ambulation Distance (Feet): 40 Feet (15 feet with sitting rest break and then 25 feet.) Assistive device: Rolling walker (2 wheeled) Gait Pattern/deviations: Decreased stride length;Decreased step length - right;Decreased step length - left;Step-through pattern;Trunk flexed;Wide base of support   Gait velocity interpretation: Below normal speed for age/gender General Gait Details: Pt needed assist to maneuver RW at times.  Cues to  stay close to RW as well. Pt unsteady at times.  Incr cues for safety with ambulation as well as with ascent and descent to surfaces.  Had to follow pt with chair.  Stairs            Wheelchair Mobility     Modified Rankin (Stroke Patients Only)       Balance Overall balance assessment: Needs assistance Sitting-balance support: No upper extremity supported;Feet supported Sitting balance-Leahy Scale: Fair     Standing balance support: Bilateral upper extremity supported;During functional activity Standing balance-Leahy Scale: Poor Standing balance comment: requires UE support for balance in standing by RW.                              Pertinent Vitals/Pain Pain Assessment: No/denies pain  HR and BP stable.  O2 down to 79% on RA with ambulation.  99% on RA at rest.  Will need continued O2 at [facility] and wean as able.     Home Living Family/patient expects to be discharged to:: Skilled nursing facility Living Arrangements: Alone Available Help at Discharge: Family;Available PRN/intermittently (2 sons intermittent supervision) Type of Home: House Home Access: Stairs to enter Entrance Stairs-Rails: Right Entrance Stairs-Number of Steps: 3 Home Layout: One level Home Equipment: Walker - 4 wheels;Cane - single point;Wheelchair - manual;Bedside commode Additional Comments: Pt reports 2 weeks ago able to ambulate with  RW, do own ADLs and do for herself.     Prior Function Level of Independence: Independent with assistive device(s)         Comments: Reports she had been using 4 wheeled RW in house at all times.       Hand Dominance   Dominant Hand: Right    Extremity/Trunk Assessment   Upper Extremity Assessment: Defer to OT evaluation           Lower Extremity Assessment: Generalized weakness      Cervical / Trunk Assessment: Kyphotic  Communication   Communication: No difficulties  Cognition Arousal/Alertness: Awake/alert Behavior During Therapy: Flat affect Overall Cognitive Status: Within Functional Limits for tasks assessed                      General Comments General comments (skin integrity, edema, etc.): poor skin integrity.  Mentioned  to nurse ? need for air mattress overlay.     Exercises        Assessment/Plan    PT Assessment Patient needs continued PT services  PT Diagnosis Generalized weakness   PT Problem List Decreased activity tolerance;Decreased balance;Decreased mobility;Decreased knowledge of use of DME;Decreased safety awareness;Cardiopulmonary status limiting activity;Decreased knowledge of precautions  PT Treatment Interventions DME instruction;Gait training;Functional mobility training;Therapeutic activities;Therapeutic exercise;Balance training;Patient/family education   PT Goals (Current goals can be found in the Care Plan section) Acute Rehab PT Goals Patient Stated Goal: to get better PT Goal Formulation: With patient Time For Goal Achievement: 06/24/15 Potential to Achieve Goals: Good    Frequency Min 2X/week   Barriers to discharge Decreased caregiver support      Co-evaluation               End of Session Equipment Utilized During Treatment: Gait belt Activity Tolerance: Patient limited by fatigue Patient left: in chair;with call bell/phone within reach;with family/visitor present;with chair alarm set Nurse Communication: Mobility status         Time: 4709-6283 PT Time Calculation (min) (ACUTE ONLY): 18 min  Charges:   PT Evaluation $Initial PT Evaluation Tier I: 1 Procedure     PT G CodesDenice Paradise 20-Jun-2015, 1:39 PM Zebulin Siegel,PT Acute Rehabilitation [phone] (pager)

## 2015-06-10 NOTE — Care Management Important Message (Signed)
Important Message  Patient Details  Name: Cynthia Hicks MRN: [id number] Date of Birth: 06-Apr-1933   Medicare Important Message Given:  Yes-second notification given    [name] May 06/10/2015, 10:24 AM

## 2015-06-11 LAB — CBC WITH DIFFERENTIAL/PLATELET
BASOS ABS: 0 10*3/uL (ref 0.0–0.1)
BASOS PCT: 0 %
EOS ABS: 0.1 10*3/uL (ref 0.0–0.7)
Eosinophils Relative: 1 %
HEMATOCRIT: 27.2 % — AB (ref 36.0–46.0)
HEMOGLOBIN: 8.5 g/dL — AB (ref 12.0–15.0)
Lymphocytes Relative: 19 %
Lymphs Abs: 1.6 10*3/uL (ref 0.7–4.0)
MCH: 30.1 pg (ref 26.0–34.0)
MCHC: 31.3 g/dL (ref 30.0–36.0)
MCV: 96.5 fL (ref 78.0–100.0)
Monocytes Absolute: 0.5 10*3/uL (ref 0.1–1.0)
Monocytes Relative: 7 %
NEUTROS ABS: 6.1 10*3/uL (ref 1.7–7.7)
NEUTROS PCT: 74 %
Platelets: 275 10*3/uL (ref 150–400)
RBC: 2.82 MIL/uL — AB (ref 3.87–5.11)
RDW: 19.1 % — ABNORMAL HIGH (ref 11.5–15.5)
WBC: 8.3 10*3/uL (ref 4.0–10.5)

## 2015-06-11 LAB — GLUCOSE, CAPILLARY
GLUCOSE-CAPILLARY: 110 mg/dL — AB (ref 65–99)
GLUCOSE-CAPILLARY: 187 mg/dL — AB (ref 65–99)
GLUCOSE-CAPILLARY: 229 mg/dL — AB (ref 65–99)
GLUCOSE-CAPILLARY: 271 mg/dL — AB (ref 65–99)
GLUCOSE-CAPILLARY: 99 mg/dL (ref 65–99)

## 2015-06-11 LAB — BASIC METABOLIC PANEL
ANION GAP: 9 (ref 5–15)
BUN: 26 mg/dL — ABNORMAL HIGH (ref 6–20)
CHLORIDE: 104 mmol/L (ref 101–111)
CO2: 28 mmol/L (ref 22–32)
CREATININE: 1.49 mg/dL — AB (ref 0.44–1.00)
Calcium: 8.6 mg/dL — ABNORMAL LOW (ref 8.9–10.3)
GFR calc non Af Amer: 32 mL/min — ABNORMAL LOW (ref >60)
GFR, EST AFRICAN AMERICAN: 37 mL/min — AB (ref >60)
Glucose, Bld: 104 mg/dL — ABNORMAL HIGH (ref 65–99)
POTASSIUM: 4.4 mmol/L (ref 3.5–5.1)
SODIUM: 141 mmol/L (ref 135–145)

## 2015-06-11 MED ORDER — INSULIN ASPART 100 UNIT/ML ~~LOC~~ SOLN
0.0000 [IU] | Freq: Every day | SUBCUTANEOUS | Status: DC
  Administered 2015-06-12: 3 [IU] via SUBCUTANEOUS

## 2015-06-11 MED ORDER — INSULIN ASPART 100 UNIT/ML ~~LOC~~ SOLN
0.0000 [IU] | Freq: Three times a day (TID) | SUBCUTANEOUS | Status: DC
  Administered 2015-06-11: 8 [IU] via SUBCUTANEOUS
  Administered 2015-06-11: 5 [IU] via SUBCUTANEOUS
  Administered 2015-06-12: 11 [IU] via SUBCUTANEOUS
  Administered 2015-06-12: 3 [IU] via SUBCUTANEOUS

## 2015-06-11 MED ORDER — INSULIN ASPART 100 UNIT/ML ~~LOC~~ SOLN
3.0000 [IU] | Freq: Three times a day (TID) | SUBCUTANEOUS | Status: DC
  Administered 2015-06-12: 3 [IU] via SUBCUTANEOUS

## 2015-06-11 NOTE — Progress Notes (Signed)
Subjective: No acute events overnight. Patient reports she is still coughing up green mucus. She denies SOB. She denies any further nausea and vomiting. She does note the antibiotics are upsetting her stomach and had 2 loose stools this morning.   Objective: Vital signs in last 24 hours: Filed Vitals:   06/10/15 1943 06/11/15 0048 06/11/15 0500 06/11/15 0546  BP: 111/42 125/67  118/57  Pulse: 68 61  56  Temp: 97.8 F (36.6 C) 97.9 F (36.6 C)  97.4 F (36.3 C)  TempSrc: Oral Oral  Oral  Resp: 24 22  18   Height:      Weight:   57.607 kg (127 lb)   SpO2: 95% 95%  98%   Weight change: 2.707 kg (5 lb 15.5 oz)  Intake/Output Summary (Last 24 hours) at 06/11/15 0747 Last data filed at 06/11/15 0507  Gross per 24 hour  Intake    403 ml  Output      0 ml  Net    403 ml   Physical Exam General: alert, sitting up, NAD HEENT: [place]/AT, EOMI, sclera anicteric, mucus membranes moist CV: RRR, no m/g/r Pulm: CTA bilaterally, breaths non-labored  Abd: BS+, soft, non-tender Ext: healing ulcer on lateral left foot. Erythema up to mid-tibial region, unchanged. She has a raised, non-fluctuant nodule on the proximal left tibia. 1+ edema in LE bilaterally.  Neuro: alert and oriented x 3  Lab Results: Basic Metabolic Panel:  Recent Labs Lab 06/10/15 0449 06/11/15 0430  NA 141 141  K 3.5 4.4  CL 102 104  CO2 30 28  GLUCOSE 138* 104*  BUN 21* 26*  CREATININE 1.39* 1.49*  CALCIUM 8.2* 8.6*  MG 1.5*  --    Liver Function Tests:  Recent Labs Lab 06/08/15 1534  AST 24  ALT 13*  ALKPHOS 83  BILITOT 0.7  PROT 5.9*  ALBUMIN 2.4*   CBC:  Recent Labs Lab 06/10/15 0449 06/11/15 0430  WBC 13.4* 8.3  NEUTROABS 11.4* 6.1  HGB 8.5* 8.5*  HCT 27.1* 27.2*  MCV 97.1 96.5  PLT 271 275   CBG:  Recent Labs Lab 06/10/15 0826 06/10/15 1237 06/10/15 1624 06/10/15 2018 06/11/15 0049 06/11/15 0359  GLUCAP 137* 232* 325* 219* 99 110*   Hemoglobin A1C:  Recent Labs Lab  06/09/15 0035  HGBA1C 7.3*   Urinalysis:  Recent Labs Lab 06/08/15 1612  COLORURINE YELLOW  LABSPEC 1.012  PHURINE 7.0  GLUCOSEU 100*  HGBUR NEGATIVE  BILIRUBINUR NEGATIVE  KETONESUR NEGATIVE  PROTEINUR NEGATIVE  UROBILINOGEN 0.2  NITRITE NEGATIVE  LEUKOCYTESUR NEGATIVE     Micro Results: Recent Results (from the past 240 hour(s))  Culture, blood (routine x 2)     Status: None (Preliminary result)   Collection Time: 06/08/15  3:55 PM  Result Value Ref Range Status   Specimen Description BLOOD RIGHT HAND  Final   Special Requests IN PEDIATRIC BOTTLE 2CC  Final   Culture NO GROWTH 2 DAYS  Final   Report Status PENDING  Incomplete  Culture, blood (routine x 2)     Status: None (Preliminary result)   Collection Time: 06/08/15  3:59 PM  Result Value Ref Range Status   Specimen Description BLOOD RIGHT ARM  Final   Special Requests BOTTLES DRAWN AEROBIC AND ANAEROBIC 5CC  Final   Culture NO GROWTH 2 DAYS  Final   Report Status PENDING  Incomplete  Urine culture     Status: None   Collection Time: 06/08/15  4:12 PM  Result Value Ref Range Status   Specimen Description URINE, RANDOM  Final   Special Requests NONE  Final   Culture 6,000 COLONIES/mL INSIGNIFICANT GROWTH  Final   Report Status 06/09/2015 FINAL  Final  MRSA PCR Screening     Status: None   Collection Time: 06/08/15 10:36 PM  Result Value Ref Range Status   MRSA by PCR NEGATIVE NEGATIVE Final    Comment:        The GeneXpert MRSA Assay (FDA approved for NASAL specimens only), is one component of a comprehensive MRSA colonization surveillance program. It is not intended to diagnose MRSA infection nor to guide or monitor treatment for MRSA infections.   Culture, expectorated sputum-assessment     Status: None   Collection Time: 06/10/15 11:20 AM  Result Value Ref Range Status   Specimen Description SPUTUM  Final   Special Requests Immunocompromised  Final   Sputum evaluation   Final    THIS SPECIMEN  IS ACCEPTABLE. RESPIRATORY CULTURE REPORT TO FOLLOW.   Report Status 06/10/2015 FINAL  Final   Studies/Results: Dg Chest 2 View  06/10/2015  CLINICAL DATA:  81 year old female with productive cough and altered mental status EXAM: CHEST  2 VIEW COMPARISON:  Prior chest x-ray 06/08/2015 FINDINGS: Stable position of left upper extremity PICC with the catheter tip overlying the distal SVC. Cardiac and mediastinal contours remain within normal limits. Atherosclerotic calcifications again noted in the transverse aorta. Chronic elevation of the right hemidiaphragm. Mild left lower lobe subsegmental atelectasis. No focal airspace consolidation, pulmonary edema, pleural effusion or pneumothorax. Remote right proximal humerus fracture. No acute osseous abnormality. IMPRESSION: 1. Stable chest x-ray without evidence of acute cardiopulmonary process. 2. Chronic elevation of the right hemidiaphragm. Electronically Signed   By: Jacqulynn Cadet M.D.   On: 06/10/2015 10:31   US Abdomen Complete  06/10/2015  CLINICAL DATA:  Gallstones EXAM: ULTRASOUND ABDOMEN COMPLETE COMPARISON:  10/26/ 16 FINDINGS: Gallbladder: Gallstones are again noted within gallbladder. The largest measures 1 cm. No thickening of gallbladder wall. No sonographic Murphy's sign. Common bile duct: Diameter: 2 mm in diameter Liver: No focal lesion identified. Within normal limits in parenchymal echogenicity. IVC: No abnormality visualized. Pancreas: Visualized portion unremarkable. Spleen: Size and appearance within normal limits. Measures 8 cm in length. Right Kidney: Length: 7.7 cm. Mild increased echogenicity probable due to atrophy. No hydronephrosis. No renal calculi. Left Kidney: Length: 8.9 cm in length. Echogenicity within normal limits. No mass or hydronephrosis visualized. Abdominal aorta: No aneurysm visualized. Measures up to 1.7 cm in diameter. Other findings: None. IMPRESSION: 1. Gallstones are noted within gallbladder the largest measures  1 cm. No thickening of gallbladder wall. No sonographic Murphy's sign. 2. Normal CBD. 3. No hydronephrosis. 4. No aortic aneurysm. Electronically Signed   By: Lahoma Crocker M.D.   On: 06/10/2015 09:59   Medications: I have reviewed the patient's current medications. Scheduled Meds: . apixaban  2.5 mg Oral BID  . bisoprolol  2.5 mg Oral QHS  . dofetilide  125 mcg Oral BID  . feeding supplement  1 Container Oral TID WC  . fentaNYL (SUBLIMAZE) injection  25 mcg Intravenous Once  . gabapentin  100 mg Oral TID  . insulin aspart  0-15 Units Subcutaneous 6 times per day  . morphine  30 mg Oral BID  . piperacillin-tazobactam (ZOSYN)  IV  3.375 g Intravenous 3 times per day  . predniSONE  20 mg Oral QAC breakfast  . sodium chloride  3 mL Intravenous Q12H  .  vancomycin  1,000 mg Intravenous Q48H   Continuous Infusions:  PRN Meds:.acetaminophen **OR** acetaminophen, LORazepam, oxyCODONE Assessment/Plan:  Sepsis Secondary to LLE Cellulitis: Sepsis has resolved. She remains afebrile and VS stable. WBC count has come down to 8.3 today. Blood cultures with NGTD. Foot cultures from [facility] grew MSSA and Capnocytophaga species. Blood cultures from [place] grew MSSA. - Will transfer to floor since doing much better  - Continue Vanc and Zosyn - ABIs showed normal arterial blood flow on left. Unable to due right due to a non-compressible vessel.   Bilious Emesis- Resolved: Had 5 episodes of bilious emesis prior to admission and has hx of cholelithiasis. Abd [place] on 11/4 confirmed cholelithiasis and showed no evidence of cholecystitis or dilation of the CBD. She denies any recurrent nausea/vomiting. - Continue to monitor  Cough with Productive Sputum: Patient still notes cough with green sputum. Awaiting sputum culture. CXR 11/4 with no evidence of PNA.  - f/u sputum culture results  Atrial Fibrillation: Currently rate controlled. - Continue Eliquis 2.5 mg BID - Continue Dofetilide 125 mcg BID -  Continue to monitor K with level >/= 4.0. K 4.4 this AM.   Autoimmune Hepatitis:  - Continue doubled prednisone dose of 20 mg daily in setting of acute illness  Chronic Shoulder and LBP: On chronic pain regimen of scheduled MS Contin 60 mg BID and Oxycodone IR 5 mg BID. - Continue MS Contin 30 mg BID - Continue Oxycodone 5 mg IR PRN - Continue Gabapentin 100 mg TID  Type 2 DM: HbA1c 7.3 on 11/3. CBGs fluctuating between 100s-300s.  - Change moderate ISS to TID AC + HS - Start Novolog 3 units TID with meals   Diastolic CHF: TEE in 29/7989 showed EF 60-65% with grade 2 diastolic dysfunction. She has some bilateral LE edema on exam, but likely related to venous stasis. Denies dyspnea. No pulmonary edema or pleural effusions on CXR on 11/4.  - Continue Bisoprolol 5 mg daily    Diet: Heart healthy DVT: Eliquis  Dispo: Disposition is deferred at this time, awaiting improvement of current medical problems.  Anticipated discharge in approximately 1-2 day(s).   The patient does have a current PCP (Imran Gertie Baron, MD) and does need an [facility] hospital follow-up appointment after discharge.  The patient does not have transportation limitations that hinder transportation to clinic appointments.  .Services Needed at time of discharge: Y = Yes, Blank = No PT:   OT:   RN:   Equipment:   Other:     LOS: 3 days   Albin Felling, MD, MPH Internal Medicine Resident, PGY-II Pager: [phone]

## 2015-06-11 NOTE — Social Work (Signed)
CSW met with patient in order to identify bed choice. Patient stated that son is looking into that and will hopefully have some information soon. RN identified that patient is transitioning to 5W. CSW made patient aware that once she and sone have decided to contact CSW via RN to identify placement preference.  Christene Lye MSW, [place]

## 2015-06-11 NOTE — Progress Notes (Signed)
Pt arrived to room 5W37 via bed. A&O x4. Pt has been oriented to unit and given call bell. Skin assessment completed. RN will continue to monitor. Rachael Fee, RN

## 2015-06-12 LAB — BASIC METABOLIC PANEL
ANION GAP: 8 (ref 5–15)
BUN: 22 mg/dL — AB (ref 6–20)
CHLORIDE: 103 mmol/L (ref 101–111)
CO2: 29 mmol/L (ref 22–32)
Calcium: 8.9 mg/dL (ref 8.9–10.3)
Creatinine, Ser: 1.23 mg/dL — ABNORMAL HIGH (ref 0.44–1.00)
GFR calc Af Amer: 47 mL/min — ABNORMAL LOW (ref >60)
GFR calc non Af Amer: 41 mL/min — ABNORMAL LOW (ref >60)
GLUCOSE: 101 mg/dL — AB (ref 65–99)
POTASSIUM: 4.1 mmol/L (ref 3.5–5.1)
Sodium: 140 mmol/L (ref 135–145)

## 2015-06-12 LAB — GLUCOSE, CAPILLARY
GLUCOSE-CAPILLARY: 93 mg/dL (ref 65–99)
GLUCOSE-CAPILLARY: 162 mg/dL — AB (ref 65–99)
GLUCOSE-CAPILLARY: 321 mg/dL — AB (ref 65–99)
GLUCOSE-CAPILLARY: 288 mg/dL — AB (ref 65–99)
Glucose-Capillary: 102 mg/dL — ABNORMAL HIGH (ref 65–99)
Glucose-Capillary: 156 mg/dL — ABNORMAL HIGH (ref 65–99)

## 2015-06-12 LAB — CBC
HCT: 28.4 % — ABNORMAL LOW (ref 36.0–46.0)
HEMOGLOBIN: 8.7 g/dL — AB (ref 12.0–15.0)
MCH: 29.7 pg (ref 26.0–34.0)
MCHC: 30.6 g/dL (ref 30.0–36.0)
MCV: 96.9 fL (ref 78.0–100.0)
Platelets: 316 10*3/uL (ref 150–400)
RBC: 2.93 MIL/uL — AB (ref 3.87–5.11)
RDW: 18.7 % — ABNORMAL HIGH (ref 11.5–15.5)
WBC: 8.5 10*3/uL (ref 4.0–10.5)

## 2015-06-12 LAB — CULTURE, RESPIRATORY

## 2015-06-12 LAB — CULTURE, RESPIRATORY W GRAM STAIN: Culture: NORMAL

## 2015-06-12 LAB — VANCOMYCIN, TROUGH: Vancomycin Tr: 9 ug/mL — ABNORMAL LOW (ref 10.0–20.0)

## 2015-06-12 MED ORDER — DM-GUAIFENESIN ER 30-600 MG PO TB12: 1.0000 | ORAL_TABLET | Freq: Two times a day (BID) | ORAL | Status: DC

## 2015-06-12 MED ORDER — DM-GUAIFENESIN ER 30-600 MG PO TB12
1.0000 | ORAL_TABLET | Freq: Two times a day (BID) | ORAL | Status: DC
  Administered 2015-06-12 – 2015-06-13 (×3): 1 via ORAL
  Filled 2015-06-12 (×4): qty 1

## 2015-06-12 MED ORDER — SODIUM CHLORIDE 0.9 % IJ SOLN
10.0000 mL | INTRAMUSCULAR | Status: DC | PRN
  Administered 2015-06-13: 10 mL
  Filled 2015-06-12: qty 40

## 2015-06-12 NOTE — Progress Notes (Signed)
ANTIBIOTIC CONSULT NOTE - FOLLOW UP  Pharmacy Consult for Zosyn and Vanc Indication: Sepsis secondary to LLE cellulitis  Allergies  Allergen Reactions  . Cyclosporine     Blasted bone marrow  . Azathioprine     Other reaction(s): Unknown  . Mycophenolate Other (See Comments)    Pre-op note mentions "intolerance" to mycophenolate.  Unknown reaction, unknown severity.  . Other     Other reaction(s): Other (See Comments) Uncoded Allergy. Allergen: Other Allergy: See Patient Chart for Details, Other Reaction: Toxicity  . Oysters [Shellfish Allergy] Nausea And Vomiting    Patient Measurements: Height: 5\' 1"  (154.9 cm) Weight: 134 lb 7.7 oz (61 kg) IBW/kg (Calculated) : 47.8  Vital Signs: Temp: 97.9 F (36.6 C) (11/06 0507) Temp Source: Oral (11/06 0507) BP: 130/48 mmHg (11/06 0507) Pulse Rate: 57 (11/06 0507) Intake/Output from previous day:   Intake/Output from this shift:    Labs:  Recent Labs  06/10/15 0449 06/11/15 0430 06/12/15 0623  WBC 13.4* 8.3 8.5  HGB 8.5* 8.5* 8.7*  PLT 271 275 316  CREATININE 1.39* 1.49* 1.23*   Estimated Creatinine Clearance: 31.1 mL/min (by C-G formula based on Cr of 1.23).  Recent Labs  06/12/15 1000  VANCOTROUGH 9*     Microbiology: Recent Results (from the past 720 hour(s))  Culture, blood (routine x 2)     Status: None (Preliminary result)   Collection Time: 06/08/15  3:55 PM  Result Value Ref Range Status   Specimen Description BLOOD RIGHT HAND  Final   Special Requests IN PEDIATRIC BOTTLE 2CC  Final   Culture NO GROWTH 4 DAYS  Final   Report Status PENDING  Incomplete  Culture, blood (routine x 2)     Status: None (Preliminary result)   Collection Time: 06/08/15  3:59 PM  Result Value Ref Range Status   Specimen Description BLOOD RIGHT ARM  Final   Special Requests BOTTLES DRAWN AEROBIC AND ANAEROBIC 5CC  Final   Culture NO GROWTH 4 DAYS  Final   Report Status PENDING  Incomplete  Urine culture     Status: None   Collection Time: 06/08/15  4:12 PM  Result Value Ref Range Status   Specimen Description URINE, RANDOM  Final   Special Requests NONE  Final   Culture 6,000 COLONIES/mL INSIGNIFICANT GROWTH  Final   Report Status 06/09/2015 FINAL  Final  MRSA PCR Screening     Status: None   Collection Time: 06/08/15 10:36 PM  Result Value Ref Range Status   MRSA by PCR NEGATIVE NEGATIVE Final    Comment:        The GeneXpert MRSA Assay (FDA approved for NASAL specimens only), is one component of a comprehensive MRSA colonization surveillance program. It is not intended to diagnose MRSA infection nor to guide or monitor treatment for MRSA infections.   Culture, expectorated sputum-assessment     Status: None   Collection Time: 06/10/15 11:20 AM  Result Value Ref Range Status   Specimen Description SPUTUM  Final   Special Requests Immunocompromised  Final   Sputum evaluation   Final    THIS SPECIMEN IS ACCEPTABLE. RESPIRATORY CULTURE REPORT TO FOLLOW.   Report Status 06/10/2015 FINAL  Final    Anti-infectives    Start     Dose/Rate Route Frequency Ordered Stop   06/10/15 1000  vancomycin (VANCOCIN) IVPB 1000 mg/200 mL premix     1,000 mg 200 mL/hr over 60 Minutes Intravenous Every 48 hours 06/10/15 0723  06/10/15 0730  vancomycin (VANCOCIN) IVPB 1000 mg/200 mL premix  Status:  Discontinued     1,000 mg 200 mL/hr over 60 Minutes Intravenous  Once 06/10/15 0715 06/10/15 0719   06/09/15 0845  piperacillin-tazobactam (ZOSYN) IVPB 3.375 g     3.375 g 12.5 mL/hr over 240 Minutes Intravenous 3 times per day 06/09/15 0832     06/08/15 1600  vancomycin (VANCOCIN) IVPB 1000 mg/200 mL premix     1,000 mg 200 mL/hr over 60 Minutes Intravenous  Once 06/08/15 1546 06/08/15 1705   06/08/15 1600  piperacillin-tazobactam (ZOSYN) IVPB 3.375 g     3.375 g 100 mL/hr over 30 Minutes Intravenous  Once 06/08/15 1546 06/08/15 1634      Assessment: 76 YOF admitted with AMS from [facility]. She was  admitted there after a stay at [facility] with cellulitis- per the H&P, this was MRSA cellulitis and the patient was on ceftriaxone via PICC line at the [facility]. Currently on IV Zosyn and Vancomycin to cover cellulitis. Afebrile, WBC wnl.   11/6 vanc trough: 9 (will continue current dose since trough taken after only 2 doses of vanc and not  at steady state)  Goal of Therapy:  Vancomycin trough level 10-15 mcg/ml (cellulitis)   Plan:  -Continue Zosyn 3.375g IV q8h EI -Continue Vancomycin at 1 gm IV Q 48 hours  -Vanc Trough 11/8 (prior to 4th dose) -monitor c/s, clinical picture, renal function, LOT  Bennye Alm, PharmD Pharmacy Resident [phone]  06/12/2015,11:19 AM

## 2015-06-12 NOTE — Progress Notes (Signed)
Subjective: Patient seen and examined this morning.  Continues to improve overall.  Did have some productive cough last night and given Mucinex DM.  States this helped her cough but still feeling congested.  No more episodes of nausea or vomiting.  Objective: Vital signs in last 24 hours: Filed Vitals:   06/11/15 1249 06/11/15 1447 06/11/15 2127 06/12/15 0507  BP: 130/76 123/44 130/43 130/48  Pulse: 60 55 53 57  Temp: 97.9 F (36.6 C) 98.8 F (37.1 C) 98.6 F (37 C) 97.9 F (36.6 C)  TempSrc: Oral Oral Oral Oral  Resp: 25 18 18 18   Height:  5\' 1"  (1.549 m)    Weight:  133 lb (60.328 kg)  134 lb 7.7 oz (61 kg)  SpO2: 94% 96% 98% 98%   Weight change: 6 lb (2.722 kg) No intake or output data in the 24 hours ending 06/12/15 0817 Physical Exam General: sitting up in bed, no distress, watching television HEENT: Normocephalic, atraumatic, EOMI, no scleral icterus Cardiac: RRR, no rubs, murmurs or gallops Pulm: clear to auscultation bilaterally, no labored breathing Abd: soft, nontender, nondistended, BS present Ext: healing ulcer on lateral left foot, erythema to mid-tibial region improved bilaterally, non tender.  She also has a raised, non-fluctuant nodule on the proximal left tibia that is related to having a food tray dropped on her leg at the SNF.  Edema of bilateral extremities 1 + is unchanged Neuro: alert and oriented X3, cranial nerves II-XII grossly intact  Lab Results: Basic Metabolic Panel:  Recent Labs Lab 06/10/15 0449 06/11/15 0430 06/12/15 0623  NA 141 141 140  K 3.5 4.4 4.1  CL 102 104 103  CO2 30 28 29   GLUCOSE 138* 104* 101*  BUN 21* 26* 22*  CREATININE 1.39* 1.49* 1.23*  CALCIUM 8.2* 8.6* 8.9  MG 1.5*  --   --    Liver Function Tests:  Recent Labs Lab 06/08/15 1534  AST 24  ALT 13*  ALKPHOS 83  BILITOT 0.7  PROT 5.9*  ALBUMIN 2.4*   CBC:  Recent Labs Lab 06/10/15 0449 06/11/15 0430 06/12/15 0623  WBC 13.4* 8.3 8.5  NEUTROABS 11.4*  6.1  --   HGB 8.5* 8.5* 8.7*  HCT 27.1* 27.2* 28.4*  MCV 97.1 96.5 96.9  PLT 271 275 316   CBG:  Recent Labs Lab 06/11/15 1648 06/11/15 2005 06/11/15 2304 06/12/15 0042 06/12/15 0411 06/12/15 0802  GLUCAP 229* 271* 187* 156* 102* 93   Hemoglobin A1C:  Recent Labs Lab 06/09/15 0035  HGBA1C 7.3*   Urinalysis:  Recent Labs Lab 06/08/15 1612  COLORURINE YELLOW  LABSPEC 1.012  PHURINE 7.0  GLUCOSEU 100*  HGBUR NEGATIVE  BILIRUBINUR NEGATIVE  KETONESUR NEGATIVE  PROTEINUR NEGATIVE  UROBILINOGEN 0.2  NITRITE NEGATIVE  LEUKOCYTESUR NEGATIVE     Micro Results: Recent Results (from the past 240 hour(s))  Culture, blood (routine x 2)     Status: None (Preliminary result)   Collection Time: 06/08/15  3:55 PM  Result Value Ref Range Status   Specimen Description BLOOD RIGHT HAND  Final   Special Requests IN PEDIATRIC BOTTLE 2CC  Final   Culture NO GROWTH 3 DAYS  Final   Report Status PENDING  Incomplete  Culture, blood (routine x 2)     Status: None (Preliminary result)   Collection Time: 06/08/15  3:59 PM  Result Value Ref Range Status   Specimen Description BLOOD RIGHT ARM  Final   Special Requests BOTTLES DRAWN AEROBIC AND ANAEROBIC 5CC  Final   Culture NO GROWTH 3 DAYS  Final   Report Status PENDING  Incomplete  Urine culture     Status: None   Collection Time: 06/08/15  4:12 PM  Result Value Ref Range Status   Specimen Description URINE, RANDOM  Final   Special Requests NONE  Final   Culture 6,000 COLONIES/mL INSIGNIFICANT GROWTH  Final   Report Status 06/09/2015 FINAL  Final  MRSA PCR Screening     Status: None   Collection Time: 06/08/15 10:36 PM  Result Value Ref Range Status   MRSA by PCR NEGATIVE NEGATIVE Final    Comment:        The GeneXpert MRSA Assay (FDA approved for NASAL specimens only), is one component of a comprehensive MRSA colonization surveillance program. It is not intended to diagnose MRSA infection nor to guide or monitor  treatment for MRSA infections.   Culture, expectorated sputum-assessment     Status: None   Collection Time: 06/10/15 11:20 AM  Result Value Ref Range Status   Specimen Description SPUTUM  Final   Special Requests Immunocompromised  Final   Sputum evaluation   Final    THIS SPECIMEN IS ACCEPTABLE. RESPIRATORY CULTURE REPORT TO FOLLOW.   Report Status 06/10/2015 FINAL  Final   Studies/Results: Dg Chest 2 View  06/10/2015  CLINICAL DATA:  81 year old female with productive cough and altered mental status EXAM: CHEST  2 VIEW COMPARISON:  Prior chest x-ray 06/08/2015 FINDINGS: Stable position of left upper extremity PICC with the catheter tip overlying the distal SVC. Cardiac and mediastinal contours remain within normal limits. Atherosclerotic calcifications again noted in the transverse aorta. Chronic elevation of the right hemidiaphragm. Mild left lower lobe subsegmental atelectasis. No focal airspace consolidation, pulmonary edema, pleural effusion or pneumothorax. Remote right proximal humerus fracture. No acute osseous abnormality. IMPRESSION: 1. Stable chest x-ray without evidence of acute cardiopulmonary process. 2. Chronic elevation of the right hemidiaphragm. Electronically Signed   By: Jacqulynn Cadet M.D.   On: 06/10/2015 10:31   US Abdomen Complete  06/10/2015  CLINICAL DATA:  Gallstones EXAM: ULTRASOUND ABDOMEN COMPLETE COMPARISON:  10/26/ 16 FINDINGS: Gallbladder: Gallstones are again noted within gallbladder. The largest measures 1 cm. No thickening of gallbladder wall. No sonographic Murphy's sign. Common bile duct: Diameter: 2 mm in diameter Liver: No focal lesion identified. Within normal limits in parenchymal echogenicity. IVC: No abnormality visualized. Pancreas: Visualized portion unremarkable. Spleen: Size and appearance within normal limits. Measures 8 cm in length. Right Kidney: Length: 7.7 cm. Mild increased echogenicity probable due to atrophy. No hydronephrosis. No renal  calculi. Left Kidney: Length: 8.9 cm in length. Echogenicity within normal limits. No mass or hydronephrosis visualized. Abdominal aorta: No aneurysm visualized. Measures up to 1.7 cm in diameter. Other findings: None. IMPRESSION: 1. Gallstones are noted within gallbladder the largest measures 1 cm. No thickening of gallbladder wall. No sonographic Murphy's sign. 2. Normal CBD. 3. No hydronephrosis. 4. No aortic aneurysm. Electronically Signed   By: Lahoma Crocker M.D.   On: 06/10/2015 09:59   Medications: I have reviewed the patient's current medications. Scheduled Meds: . apixaban  2.5 mg Oral BID  . bisoprolol  2.5 mg Oral QHS  . dextromethorphan-guaiFENesin  1 tablet Oral BID  . dofetilide  125 mcg Oral BID  . feeding supplement  1 Container Oral TID WC  . fentaNYL (SUBLIMAZE) injection  25 mcg Intravenous Once  . gabapentin  100 mg Oral TID  . insulin aspart  0-15 Units Subcutaneous TID  WC  . insulin aspart  0-5 Units Subcutaneous QHS  . insulin aspart  3 Units Subcutaneous TID WC  . morphine  30 mg Oral BID  . piperacillin-tazobactam (ZOSYN)  IV  3.375 g Intravenous 3 times per day  . predniSONE  20 mg Oral QAC breakfast  . sodium chloride  3 mL Intravenous Q12H  . vancomycin  1,000 mg Intravenous Q48H   Continuous Infusions:  PRN Meds:.acetaminophen **OR** acetaminophen, LORazepam, oxyCODONE, sodium chloride Assessment/Plan:  Sepsis Secondary to LLE Cellulitis: Sepsis has resolved. She remains afebrile (Tmax last 24hrs 98.8) and vitals have been stable.  -WBC has trended down and is 8.5 today.  -Blood cultures drawn on admit NGTD. Foot cultures from [facility] grew MSSA and Capnocytophaga species. Blood cultures from [place] grew MSSA.  - Continue Vanc and Zosyn  Bilious Emesis- Resolved: Had 5 episodes of bilious emesis prior to admission and has hx of cholelithiasis.  -Abdominal ultrasound on 11/4 confirmed cholelithiasis and showed no evidence of cholecystitis or dilation  of the CBD. She denies any recurrent nausea/vomiting. - Continue to monitor  Cough with Productive Sputum: Patient still reports cough with productive sputum - CXR 11/4 with no evidence of pneumonia [ ]  sputum culture pending - Mucinex DM 1 tablet BID  Atrial Fibrillation: Currently rate controlled. - Continue Eliquis 2.5 mg BID - Continue Dofetilide 125 mcg BID - Continue to monitor K with level >/= 4.0 with Dofetilide.  K 4.1 this morning   Autoimmune Hepatitis:  - Continue doubled prednisone dose of 20 mg daily in setting of acute illness  Chronic Shoulder and LBP: On chronic pain regimen of scheduled MS Contin 60 mg BID and Oxycodone IR 5 mg BID.  Pain currently controlled on halved MS Contin dose and Oxycodone 5mg  BID PRN - Continue MS Contin 30 mg BID - Continue Oxycodone 5 mg IR BID PRN - Continue Gabapentin 100 mg TID  Type 2 DM: HbA1c 7.3 on 11/3. CBGs over last 24 hours ranging from 93-271 - Continue moderate SSI with HS coverage - Continue Novolog 3 units TID with meals   Diastolic CHF: TEE in 89/3810 showed EF 60-65% with grade 2 diastolic dysfunction. She has some bilateral LE edema on exam, but likely related to venous stasis. Denies dyspnea. No pulmonary edema or pleural effusions on CXR on 11/4.  - Continue Bisoprolol 5 mg daily   Diet: Heart healthy  DVT PPx: Eliquis   Dispo: Disposition is deferred at this time, awaiting improvement of current medical problems as well as SNF placement.  The patient does have a current PCP (Imran Gertie Baron, MD) and does need an [facility] hospital follow-up appointment after discharge.  The patient does not have transportation limitations that hinder transportation to clinic appointments.  .Services Needed at time of discharge: Y = Yes, Blank = No PT:   OT:   RN:   Equipment:   Other:     LOS: 4 days   Jule Ser, DO 06/12/2015, 8:17 AM PGY-1, [place] [facility] Pager: [phone]

## 2015-06-13 DIAGNOSIS — Z7901 Long term (current) use of anticoagulants: Secondary | ICD-10-CM

## 2015-06-13 DIAGNOSIS — M25519 Pain in unspecified shoulder: Secondary | ICD-10-CM

## 2015-06-13 DIAGNOSIS — S91302D Unspecified open wound, left foot, subsequent encounter: Secondary | ICD-10-CM

## 2015-06-13 DIAGNOSIS — Z8619 Personal history of other infectious and parasitic diseases: Secondary | ICD-10-CM

## 2015-06-13 DIAGNOSIS — X58XXXD Exposure to other specified factors, subsequent encounter: Secondary | ICD-10-CM

## 2015-06-13 DIAGNOSIS — Z794 Long term (current) use of insulin: Secondary | ICD-10-CM

## 2015-06-13 LAB — CULTURE, BLOOD (ROUTINE X 2)
Culture: NO GROWTH
Culture: NO GROWTH

## 2015-06-13 LAB — CBC
HEMATOCRIT: 29 % — AB (ref 36.0–46.0)
Hemoglobin: 9 g/dL — ABNORMAL LOW (ref 12.0–15.0)
MCH: 30 pg (ref 26.0–34.0)
MCHC: 31 g/dL (ref 30.0–36.0)
MCV: 96.7 fL (ref 78.0–100.0)
Platelets: 319 10*3/uL (ref 150–400)
RBC: 3 MIL/uL — ABNORMAL LOW (ref 3.87–5.11)
RDW: 18.5 % — AB (ref 11.5–15.5)
WBC: 8.3 10*3/uL (ref 4.0–10.5)

## 2015-06-13 LAB — GLUCOSE, CAPILLARY
GLUCOSE-CAPILLARY: 110 mg/dL — AB (ref 65–99)
GLUCOSE-CAPILLARY: 263 mg/dL — AB (ref 65–99)
Glucose-Capillary: 112 mg/dL — ABNORMAL HIGH (ref 65–99)
Glucose-Capillary: 146 mg/dL — ABNORMAL HIGH (ref 65–99)

## 2015-06-13 LAB — BASIC METABOLIC PANEL
ANION GAP: 9 (ref 5–15)
BUN: 23 mg/dL — ABNORMAL HIGH (ref 6–20)
CALCIUM: 8.8 mg/dL — AB (ref 8.9–10.3)
CHLORIDE: 103 mmol/L (ref 101–111)
CO2: 29 mmol/L (ref 22–32)
Creatinine, Ser: 1.3 mg/dL — ABNORMAL HIGH (ref 0.44–1.00)
GFR calc Af Amer: 44 mL/min — ABNORMAL LOW (ref >60)
GFR calc non Af Amer: 38 mL/min — ABNORMAL LOW (ref >60)
GLUCOSE: 109 mg/dL — AB (ref 65–99)
POTASSIUM: 4.2 mmol/L (ref 3.5–5.1)
Sodium: 141 mmol/L (ref 135–145)

## 2015-06-13 MED ORDER — CEFAZOLIN SODIUM-DEXTROSE 2-3 GM-% IV SOLR: 2.0000 g | Freq: Two times a day (BID) | INTRAVENOUS | Status: AC

## 2015-06-13 MED ORDER — CEFAZOLIN SODIUM 1-5 GM-% IV SOLN
1.0000 g | INTRAVENOUS | Status: AC
  Administered 2015-06-13: 1 g via INTRAVENOUS
  Filled 2015-06-13: qty 50

## 2015-06-13 MED ORDER — MORPHINE SULFATE ER 30 MG PO TBCR: 30.0000 mg | EXTENDED_RELEASE_TABLET | Freq: Two times a day (BID) | ORAL | Status: DC

## 2015-06-13 MED ORDER — INSULIN ASPART 100 UNIT/ML ~~LOC~~ SOLN
0.0000 [IU] | Freq: Three times a day (TID) | SUBCUTANEOUS | Status: DC
  Administered 2015-06-13: 8 [IU] via SUBCUTANEOUS

## 2015-06-13 MED ORDER — OXYCODONE HCL 5 MG PO TABS: 5.0000 mg | ORAL_TABLET | Freq: Two times a day (BID) | ORAL | Status: DC | PRN

## 2015-06-13 MED ORDER — CEFAZOLIN SODIUM 1-5 GM-% IV SOLN
1.0000 g | Freq: Two times a day (BID) | INTRAVENOUS | Status: DC
  Administered 2015-06-13: 1 g via INTRAVENOUS
  Filled 2015-06-13: qty 50

## 2015-06-13 MED ORDER — DM-GUAIFENESIN ER 30-600 MG PO TB12: 1.0000 | ORAL_TABLET | Freq: Two times a day (BID) | ORAL | Status: DC

## 2015-06-13 MED ORDER — CEFAZOLIN SODIUM-DEXTROSE 2-3 GM-% IV SOLR
2.0000 g | Freq: Two times a day (BID) | INTRAVENOUS | Status: DC
  Filled 2015-06-13: qty 50

## 2015-06-13 MED ORDER — INSULIN ASPART 100 UNIT/ML ~~LOC~~ SOLN
3.0000 [IU] | Freq: Three times a day (TID) | SUBCUTANEOUS | Status: DC
  Administered 2015-06-13 (×2): 3 [IU] via SUBCUTANEOUS

## 2015-06-13 MED ORDER — PREDNISONE 10 MG PO TABS: 10.0000 mg | ORAL_TABLET | Freq: Every day | ORAL | Status: DC

## 2015-06-13 MED ORDER — HEPARIN SOD (PORK) LOCK FLUSH 100 UNIT/ML IV SOLN
250.0000 [IU] | INTRAVENOUS | Status: AC | PRN
  Administered 2015-06-13: 250 [IU]

## 2015-06-13 NOTE — Care Management Note (Signed)
Case Management Note  Patient Details  Name: AFTYN NOTT MRN: [id number] Date of Birth: 04-03-1933  Subjective/Objective:      Patient is dc to [facility] today.  CSW following.              Action/Plan:   Expected Discharge Date:                  Expected Discharge Plan:  Skilled Nursing Facility  In-House Referral:  Clinical Social Work  Discharge planning Services  CM Consult  Post Acute Care Choice:    Choice offered to:     DME Arranged:    DME Agency:     HH Arranged:    [place] Agency:     Status of Service:  Completed, signed off  Medicare Important Message Given:  Yes-second notification given Date Medicare IM Given:    Medicare IM give by:    Date Additional Medicare IM Given:    Additional Medicare Important Message give by:     If discussed at [place] of Stay Meetings, dates discussed:    Additional Comments:  Zenon Mayo, RN 06/13/2015, 4:05 PM

## 2015-06-13 NOTE — Progress Notes (Signed)
Subjective: Patient seen and examined this morning.  No acute events overnight.  Denies fever, chills, SOB.  Still does have cough that states Mucinex has helped.  She and her son are still deciding which SNF will be best to go to at discharge.  Objective: Vital signs in last 24 hours: Filed Vitals:   06/12/15 0507 06/12/15 1443 06/12/15 2145 06/13/15 0506  BP: 130/48 128/44 136/48 157/56  Pulse: 57 65 56 66  Temp: 97.9 F (36.6 C)  98.1 F (36.7 C) 98.5 F (36.9 C)  TempSrc: Oral  Oral Oral  Resp: 18 22 18 18   Height:      Weight: 134 lb 7.7 oz (61 kg)   135 lb 9.3 oz (61.5 kg)  SpO2: 98% 99% 97% 99%   Weight change: 2 lb 9.3 oz (1.172 kg)  Intake/Output Summary (Last 24 hours) at 06/13/15 1119 Last data filed at 06/13/15 1117  Gross per 24 hour  Intake   1322 ml  Output    500 ml  Net    822 ml   Physical Exam General: sitting up in chair, no distress, watching television HEENT: Normocephalic, atraumatic, EOMI, no scleral icterus Cardiac: RRR, no rubs, murmurs or gallops Pulm: clear to auscultation bilaterally, no labored breathing Abd: soft, nontender, nondistended, BS present Ext: 1+ bilateral lower extremity edema,  ulcer on lateral left foot looks worse today with some drainage. Patient denies any tenderness to the area. Mid-tibia erythema improved.  She also has a raised, non-fluctuant nodule on the proximal left tibia that is related to having a food tray dropped on her leg at previous SNF. Neuro: alert and oriented X3, cranial nerves II-XII grossly intact  Lab Results: Basic Metabolic Panel:  Recent Labs Lab 06/10/15 0449  06/12/15 0623 06/13/15 0401  NA 141  < > 140 141  K 3.5  < > 4.1 4.2  CL 102  < > 103 103  CO2 30  < > 29 29  GLUCOSE 138*  < > 101* 109*  BUN 21*  < > 22* 23*  CREATININE 1.39*  < > 1.23* 1.30*  CALCIUM 8.2*  < > 8.9 8.8*  MG 1.5*  --   --   --   < > = values in this interval not displayed. Liver Function Tests:  Recent  Labs Lab 06/08/15 1534  AST 24  ALT 13*  ALKPHOS 83  BILITOT 0.7  PROT 5.9*  ALBUMIN 2.4*   CBC:  Recent Labs Lab 06/10/15 0449 06/11/15 0430 06/12/15 0623 06/13/15 0401  WBC 13.4* 8.3 8.5 8.3  NEUTROABS 11.4* 6.1  --   --   HGB 8.5* 8.5* 8.7* 9.0*  HCT 27.1* 27.2* 28.4* 29.0*  MCV 97.1 96.5 96.9 96.7  PLT 271 275 316 319   CBG:  Recent Labs Lab 06/12/15 1156 06/12/15 1646 06/12/15 2030 06/13/15 0024 06/13/15 0412 06/13/15 0802  GLUCAP 162* 321* 288* 146* 112* 110*   Hemoglobin A1C:  Recent Labs Lab 06/09/15 0035  HGBA1C 7.3*   Urinalysis:  Recent Labs Lab 06/08/15 1612  COLORURINE YELLOW  LABSPEC 1.012  PHURINE 7.0  GLUCOSEU 100*  HGBUR NEGATIVE  BILIRUBINUR NEGATIVE  KETONESUR NEGATIVE  PROTEINUR NEGATIVE  UROBILINOGEN 0.2  NITRITE NEGATIVE  LEUKOCYTESUR NEGATIVE     Micro Results: Recent Results (from the past 240 hour(s))  Culture, blood (routine x 2)     Status: None (Preliminary result)   Collection Time: 06/08/15  3:55 PM  Result Value Ref Range Status  Specimen Description BLOOD RIGHT HAND  Final   Special Requests IN PEDIATRIC BOTTLE 2CC  Final   Culture NO GROWTH 4 DAYS  Final   Report Status PENDING  Incomplete  Culture, blood (routine x 2)     Status: None (Preliminary result)   Collection Time: 06/08/15  3:59 PM  Result Value Ref Range Status   Specimen Description BLOOD RIGHT ARM  Final   Special Requests BOTTLES DRAWN AEROBIC AND ANAEROBIC 5CC  Final   Culture NO GROWTH 4 DAYS  Final   Report Status PENDING  Incomplete  Urine culture     Status: None   Collection Time: 06/08/15  4:12 PM  Result Value Ref Range Status   Specimen Description URINE, RANDOM  Final   Special Requests NONE  Final   Culture 6,000 COLONIES/mL INSIGNIFICANT GROWTH  Final   Report Status 06/09/2015 FINAL  Final  MRSA PCR Screening     Status: None   Collection Time: 06/08/15 10:36 PM  Result Value Ref Range Status   MRSA by PCR NEGATIVE  NEGATIVE Final    Comment:        The GeneXpert MRSA Assay (FDA approved for NASAL specimens only), is one component of a comprehensive MRSA colonization surveillance program. It is not intended to diagnose MRSA infection nor to guide or monitor treatment for MRSA infections.   Culture, expectorated sputum-assessment     Status: None   Collection Time: 06/10/15 11:20 AM  Result Value Ref Range Status   Specimen Description SPUTUM  Final   Special Requests Immunocompromised  Final   Sputum evaluation   Final    THIS SPECIMEN IS ACCEPTABLE. RESPIRATORY CULTURE REPORT TO FOLLOW.   Report Status 06/10/2015 FINAL  Final  Culture, respiratory (NON-Expectorated)     Status: None   Collection Time: 06/10/15 11:20 AM  Result Value Ref Range Status   Specimen Description SPUTUM  Final   Special Requests NONE  Final   Gram Stain   Final    ABUNDANT WBC PRESENT, PREDOMINANTLY PMN RARE SQUAMOUS EPITHELIAL CELLS PRESENT FEW YEAST RARE GRAM POSITIVE RODS Performed at [facility]    Culture   Final    NORMAL OROPHARYNGEAL FLORA Performed at [facility]    Report Status 06/12/2015 FINAL  Final   Studies/Results: No results found. Medications: I have reviewed the patient's current medications. Scheduled Meds: . apixaban  2.5 mg Oral BID  . bisoprolol  2.5 mg Oral QHS  . dextromethorphan-guaiFENesin  1 tablet Oral BID  . dofetilide  125 mcg Oral BID  . feeding supplement  1 Container Oral TID WC  . fentaNYL (SUBLIMAZE) injection  25 mcg Intravenous Once  . gabapentin  100 mg Oral TID  . insulin aspart  0-15 Units Subcutaneous TID WC  . insulin aspart  0-5 Units Subcutaneous QHS  . insulin aspart  3 Units Subcutaneous TID WC  . morphine  30 mg Oral BID  . piperacillin-tazobactam (ZOSYN)  IV  3.375 g Intravenous 3 times per day  . predniSONE  20 mg Oral QAC breakfast  . sodium chloride  3 mL Intravenous Q12H  . vancomycin  1,000 mg Intravenous Q48H   Continuous  Infusions:  PRN Meds:.acetaminophen **OR** acetaminophen, LORazepam, oxyCODONE, sodium chloride Assessment/Plan:  Sepsis Secondary to LLE Cellulitis: Sepsis has resolved. She remains afebrile (Tmax last 24hrs 98.5) and vitals have been stable.  - WBC has trended down and is 8.3 today.  - Blood cultures drawn on admit NGTD. Foot  cultures from [facility] grew MSSA and Capnocytophaga species. Blood cultures from [place] grew MSSA.  - WOC consult for foot ulcer - Discontinue Vanc and [place] for Cefazolin - Patient to have 6 weeks of antibiotics (10/26 - 12/7)  Bilious Emesis- Resolved: Had 5 episodes of bilious emesis prior to admission and has hx of cholelithiasis.  - Abdominal ultrasound on 11/4 confirmed cholelithiasis and showed no evidence of cholecystitis or dilation of the CBD. She denies any recurrent nausea/vomiting. - Continue to monitor  Cough with Productive Sputum: Patient still reports cough with productive sputum - CXR 11/4 with no evidence of pneumonia - Non-expectorated respiratory culture w/ normal oropharyngeal flora [ ]  Expectorated sputum culture pending - Mucinex DM 1 tablet BID  Atrial Fibrillation: Currently rate controlled. - Continue Eliquis 2.5 mg BID - Continue Dofetilide 125 mcg BID - Continue to monitor K with level >/= 4.0 with Dofetilide.  K 4.2 this morning   Autoimmune Hepatitis:  - Decrease prednisone to home dose of 10mg  daily  Chronic Shoulder and LBP: On chronic pain regimen of scheduled MS Contin 60 mg BID and Oxycodone IR 5 mg BID.  Pain currently controlled on halved MS Contin dose and Oxycodone 5mg  BID PRN - Continue MS Contin 30 mg BID - Continue Oxycodone 5 mg IR BID PRN - Continue Gabapentin 100 mg TID - Both PT and OT have evaluated patient and recommending SNF placement with no equipment recommendations  Type 2 DM: HbA1c 7.3 on 11/3 - Continue moderate SSI with HS coverage - Continue Novolog 3 units TID with  meals   Diastolic CHF: TEE in 54/6568 showed EF 60-65% with grade 2 diastolic dysfunction. She has some bilateral LE edema on exam, but likely related to venous stasis. Denies dyspnea. No pulmonary edema or pleural effusions on CXR on 11/4.  - Continue Bisoprolol 5 mg daily   Diet: Heart healthy  DVT PPx: Eliquis   Dispo: Disposition is deferred at this time, awaiting improvement of current medical problems as well as SNF placement.  The patient does have a current PCP (Imran Gertie Baron, MD) and does need an [facility] hospital follow-up appointment after discharge.  The patient does not have transportation limitations that hinder transportation to clinic appointments.  .Services Needed at time of discharge: Y = Yes, Blank = No PT:   OT:   RN:   Equipment:   Other:     LOS: 5 days   Jule Ser, DO 06/13/2015, 11:19 AM PGY-1, [place] [facility] Pager: [phone]

## 2015-06-13 NOTE — Discharge Summary (Signed)
Name: Cynthia Hicks MRN: [id number] DOB: April 10, 1933 81 y.o. PCP: Raelyn Number, MD  Date of Admission: 06/08/2015  3:05 PM Date of Discharge: 06/13/2015 Attending Physician: Campbell Riches, MD  Discharge Diagnosis: 1. Sepsis secondary to LLE Cellulitis  Active Problems:   Anemia of chronic disease   HTN (hypertension)   Atrial fibrillation (HCC)   Chronic diastolic CHF (congestive heart failure) (HCC)   Diabetes mellitus, type 2 ([place])   Long term current use of systemic steroids   CKD (chronic kidney disease), stage III   Sepsis ([place])   Sepsis due to methicillin resistant Staphylococcus aureus ([place])   PICC (peripherally inserted central catheter) in place  Discharge Medications:   Medication List    ASK your doctor about these medications        alendronate 70 MG tablet  Commonly known as:  FOSAMAX  Take 70 mg by mouth once a week. Take with a full glass of water on an empty stomach.     apixaban 2.5 MG Tabs tablet  Commonly known as:  ELIQUIS  Take 1 tablet (2.5 mg total) by mouth 2 (two) times daily.     bisoprolol 5 MG tablet  Commonly known as:  ZEBETA  Take 1 tablet (5 mg total) by mouth daily.     docusate sodium 100 MG capsule  Commonly known as:  COLACE  Take 100 mg by mouth at bedtime.     dofetilide 125 MCG capsule  Commonly known as:  TIKOSYN  Take 1 capsule (125 mcg total) by mouth 2 (two) times daily.     ezetimibe 10 MG tablet  Commonly known as:  ZETIA  Take 10 mg by mouth daily.     FISH OIL + D3 PO  Take 1 capsule by mouth 2 (two) times daily.     folic acid 130 MCG tablet  Commonly known as:  FOLVITE  Take 400 mcg by mouth daily.     furosemide 40 MG tablet  Commonly known as:  LASIX  Take 60 mg by mouth at bedtime.     gabapentin 100 MG capsule  Commonly known as:  NEURONTIN  Take 100 mg by mouth 3 (three) times daily.     hydrALAZINE 100 MG tablet  Commonly known as:  APRESOLINE  Take 100 mg by mouth 3 (three) times  daily.     LORazepam 1 MG tablet  Commonly known as:  ATIVAN  Take 1 mg by mouth every 4 (four) hours as needed for anxiety.     magnesium oxide 400 (241.3 MG) MG tablet  Commonly known as:  MAG-OX  Take 1 tablet (400 mg total) by mouth daily.     metolazone 2.5 MG tablet  Commonly known as:  ZAROXOLYN  Take 2.5 mg by mouth once a week.     morphine 60 MG 12 hr tablet  Commonly known as:  MS CONTIN  Take 1 tablet by mouth 2 (two) times daily.     nitroGLYCERIN 0.4 MG SL tablet  Commonly known as:  NITROSTAT  Place 1 tablet (0.4 mg total) under the tongue every 5 (five) minutes as needed for chest pain.     omeprazole 20 MG capsule  Commonly known as:  PRILOSEC  Take 20 mg by mouth daily.     oxyCODONE 5 MG immediate release tablet  Commonly known as:  Oxy IR/ROXICODONE  Take 5 mg by mouth 2 (two) times daily as needed for moderate pain.  pravastatin 80 MG tablet  Commonly known as:  PRAVACHOL  Take 1 tablet (80 mg total) by mouth every evening.     predniSONE 10 MG tablet  Commonly known as:  DELTASONE  Take 10 mg by mouth daily.     ROCEPHIN 1 G injection  Generic drug:  cefTRIAXone  Inject 1 g into the vein daily.     senna 8.6 MG Tabs tablet  Commonly known as:  SENOKOT  Take 1 tablet by mouth daily as needed for mild constipation.     sulfamethoxazole-trimethoprim 800-160 MG tablet  Commonly known as:  BACTRIM DS,SEPTRA DS  TK 1 T PO BID FOR 10 DAYS     vitamin C 1000 MG tablet  Take 1,000 mg by mouth 2 (two) times daily.     VITAMIN D PO  Take 2,000 Units by mouth at bedtime.        Disposition and follow-up:   Cynthia Hicks was discharged from [facility] in Stable condition.  At the hospital follow up visit please address:  1.  Blood cultures and completion of antibiotics.  Patient to receive 6 total weeks of antibiotics via PICC line (10/26-12/7).  She will be discharged on Cefazolin 2gm q12h.    Also, for lateral left  foot wound, please follow WOC recs: have the left lateral foot wound painted daily with betadine to dry this area and have the antibacterial affects, allow to air dry, protect with dry dressing. Change daily.  Monitor BMP to keep potassium level at or equal to 4.0 as patient is on Dofetilide.  Her home meds of Lasix and Hydralazine were held and not continued on discharge.  Her BP has been stable and she is not fluid overloaded.  Please assess whether to continue these as clinically indicated.  Her home dose of MS Contin was halved from 60 to 30mg  BID and she seemed to tolerate this well.  Please continue to assess her pain control.  2.  Labs / imaging needed at time of follow-up: none  3.  Pending labs/ test needing follow-up: blood cultures, sputum culture  Follow-up Appointments:   Discharge Instructions:   Consultations:    Procedures Performed:  Dg Chest 2 View  06/10/2015  CLINICAL DATA:  81 year old female with productive cough and altered mental status EXAM: CHEST  2 VIEW COMPARISON:  Prior chest x-ray 06/08/2015 FINDINGS: Stable position of left upper extremity PICC with the catheter tip overlying the distal SVC. Cardiac and mediastinal contours remain within normal limits. Atherosclerotic calcifications again noted in the transverse aorta. Chronic elevation of the right hemidiaphragm. Mild left lower lobe subsegmental atelectasis. No focal airspace consolidation, pulmonary edema, pleural effusion or pneumothorax. Remote right proximal humerus fracture. No acute osseous abnormality. IMPRESSION: 1. Stable chest x-ray without evidence of acute cardiopulmonary process. 2. Chronic elevation of the right hemidiaphragm. Electronically Signed   By: Jacqulynn Cadet M.D.   On: 06/10/2015 10:31   Dg Tibia/fibula Left  06/08/2015  CLINICAL DATA:  81 year old with edema and erythema of the left lower leg and foot which began approximately 1 week ago. EXAM: LEFT TIBIA AND FIBULA - 2 VIEW  COMPARISON:  No prior tibia-fibula imaging. Left foot CT 05/31/2015. Left knee MRI 11/09/2013. FINDINGS: No evidence of acute, subacute or healed fractures. Mild osseous demineralization. Bone infarcts in the proximal tibial metaphysis and both femoral condyles as noted on the prior MRI. No new intrinsic osseous abnormality. IMPRESSION: 1. No acute or subacute osseous abnormality. 2.  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 Electronically Signed   By: Evangeline Dakin M.D.   On: 06/08/2015 17:13   US Abdomen Complete  06/10/2015  CLINICAL DATA:  Gallstones EXAM: ULTRASOUND ABDOMEN COMPLETE COMPARISON:  10/26/ 16 FINDINGS: Gallbladder: Gallstones are again noted within gallbladder. The largest measures 1 cm. No thickening of gallbladder wall. No sonographic Murphy's sign. Common bile duct: Diameter: 2 mm in diameter Liver: No focal lesion identified. Within normal limits in parenchymal echogenicity. IVC: No abnormality visualized. Pancreas: Visualized portion unremarkable. Spleen: Size and appearance within normal limits. Measures 8 cm in length. Right Kidney: Length: 7.7 cm. Mild increased echogenicity probable due to atrophy. No hydronephrosis. No renal calculi. Left Kidney: Length: 8.9 cm in length. Echogenicity within normal limits. No mass or hydronephrosis visualized. Abdominal aorta: No aneurysm visualized. Measures up to 1.7 cm in diameter. Other findings: None. IMPRESSION: 1. Gallstones are noted within gallbladder the largest measures 1 cm. No thickening of gallbladder wall. No sonographic Murphy's sign. 2. Normal CBD. 3. No hydronephrosis. 4. No aortic aneurysm. Electronically Signed   By: Lahoma Crocker M.D.   On: 06/10/2015 09:59   Dg Chest Port 1 View  06/08/2015  CLINICAL DATA:  81 year old female status post PICC placement. History of MR ASA infection. EXAM: PORTABLE CHEST 1 VIEW COMPARISON:  Chest x-ray 05/31/2015. FINDINGS: New left  upper extremity PICC with tip terminating the distal superior vena cava. Chronically elevated right hemidiaphragm. Opacities at the right lung base likely reflect atelectasis and some chronic scarring. Possible small right pleural effusion (likely chronic). Left lung appears clear. No pleural or church and no evidence of pulmonary edema. Heart size is mildly enlarged. No pneumothorax. Upper mediastinal contours are within normal limits. Mild bilateral apical pleuroparenchymal thickening and calcification, most compatible with chronic post infectious or inflammatory scarring. Atherosclerosis in the thoracic aorta. IMPRESSION: 1. Tip of PICC is in the distal superior vena cava. 2. No pneumothorax. 3. Chronically elevated right hemidiaphragm with probable right basilar atelectasis and/or scarring, similar to prior studies. 4. Mild cardiomegaly. 5. Atherosclerosis. Electronically Signed   By: Vinnie Langton M.D.   On: 06/08/2015 15:35   Dg Foot 2 Views Left  06/08/2015  CLINICAL DATA:  81 year old female with redness and swelling of the left foot and left tibia/ fibula. Wound on the lateral aspect of the left foot. EXAM: LEFT FOOT - 2 VIEW COMPARISON:  No priors. FINDINGS: Mild osteopenia. No focal aggressive areas of osteolysis identified. No acute displaced fractures. Multifocal degenerative changes of osteoarthritis. Numerous vascular calcifications. Soft tissue swelling overlying the forefoot. IMPRESSION: 1. No acute osseous abnormality in the left foot. 2. Diffuse soft tissue swelling most pronounced over the forefoot. 3. Atherosclerosis. 4. Mild osteopenia. Electronically Signed   By: Vinnie Langton M.D.   On: 06/08/2015 17:12    2D Echo: none  Cardiac Cath: none  Admission HPI: Ms. Jardin a 82 year old woman with a past medical history of atrial fibrillation on apixibanand dofetilide (CHA2DS2VASc 6), CAD (NSTEMI x2 '07, '13, s/p DES), autoimmune hepatitis (on prednisone), ischemic cardiomyopathy  (11/15 TEE 55-65%) who presents with altered mental status from [facility]. Patient was lethargic during the encounter, so history was primarily obtained from son [name] and chart review; however, she was able to describe some her current symptoms. According to her patient transfer form, she had been admitted to [facility] from 10/25 to 10/31 for treatment of a MRSA  cellulitis of her left foot. She had been discharged to [place] on 10/31, where she had continued to receive ceftriaxone via PICC for this infection. A request was made to obtain records from [facility], and multiple calls were made to [facility] in attempts to obtain more history, blood culture results, and antibiotic sensitivities, but hitherto the latter has not responded. Her son, [name], had said that he had visited her at [facility] last night and she appeared more tired and had been complaining of nausea and her "voice had changed." According to her transfer form, she had endorsed some bowel and bladder incontinence, and the minimal history we did obtain from Ms. Stumpo suggested that she had been moved to the bedpan at her facility and "left there for hours."She reports that she has been coughing up some mucus, but her son reports she had not been complaining of any of this. She has some shoulder pain, but it is longstanding. He also says she had not complained of chest pain, shortness of breath, nausea, vomiting, diarrhea, constipation, or abdominal pain. With respect to management of her other medical issues, she is followed by Dr. Aundra Dubin for management of her Afib, and had been attempting to proceed to cholecystectomy; however, she has been unable to due to her multiple hospitalizations for other medical issues. She does have a PCP in [place], but is apparently not interested in seeing him anymore.  When EMS arrived at [facility], she had pinpoint pupils and was lethargic. Therefore,  she was administered Narcan, and in the ED she was noted to be agitated and confused, believing it was 1960. In the ED, she was febrile (101.3-103.7), tachycardic (106-132), tachypneic (22-40), but normotensive and satting 98% on 2LNC. She had a leukocytosis to 23.5, and an initial lactic acid of 2.26 that decreased to 1.2 with 1.5 L NS boluses. She was noted to convert to SVT briefly into the 160s with a baseline rhythm of atrial fibrillation with RVR (130-140s), which improved with fluid boluses. Blood and urine cultures were obtained prior to administering vancomycin and Zosyn. A left foot X-ray demonstrated no osseous abnormality, just soft tissue swelling. Tibia/fibula X-ray showed no osseous abnormality but evidence bone infarct.  Hospital Course by problem list: Active Problems:   Anemia of chronic disease   HTN (hypertension)   Atrial fibrillation (HCC)   Chronic diastolic CHF (congestive heart failure) (HCC)   Diabetes mellitus, type 2 ([place])   Long term current use of systemic steroids   CKD (chronic kidney disease), stage III   Sepsis ([place])   Sepsis due to methicillin resistant Staphylococcus aureus ([place])   PICC (peripherally inserted central catheter) in place   Sepsis Secondary to LLE Cellulitis: Sepsis has resolved. She remains afebrile (Tmax last 24hrs 98.5) and vitals have been stable.  - WBC has trended down since admission and is 8.3 today.  - Blood cultures drawn on admit NGTD. Foot cultures from [facility] grew MSSA and Capnocytophaga species. Blood cultures from [place] grew MSSA.  - WOC consult for foot ulcer as above - Patient to have 6 weeks of antibiotics (10/26 - 12/7).  At discharge will rx cefazolin 2gm q12h  Bilious Emesis- Resolved: Had 5 episodes of bilious emesis prior to admission and has hx of cholelithiasis.  - Abdominal ultrasound on 11/4 confirmed cholelithiasis and showed no evidence of cholecystitis or dilation of the CBD. She denies any  recurrent nausea/vomiting.  Cough with Productive Sputum: Patient still reports cough that  is improving - CXR 11/4 with no evidence of pneumonia - Non-expectorated respiratory culture w/ normal oropharyngeal flora - Expectorated sputum culture pending at discharge  - Will continue Mucinex DM 1 tablet BID  Atrial Fibrillation: Currently rate controlled. - Continue Eliquis 2.5 mg BID - Continue Dofetilide 125 mcg BID - Continue to monitor K with level >/= 4.0 with Dofetilide. K 4.2 at discharge  Autoimmune Hepatitis:  - Initially, started on stress dose steroids in setting of sepsis.  Decreased prednisone to home dose of 10mg  daily at discharge  Chronic Shoulder and LBP: On chronic pain regimen of scheduled MS Contin 30 mg BID and Oxycodone IR 5 mg BID.  At admission, she was on 60mg  BID of MS Contin, however, part of her altered mental status was thought to be due to pain medication so we halved this dose and she seemed to tolerate it well.  - Continue MS Contin 30 mg BID - Continue Oxycodone 5 mg IR BID PRN - Continue Gabapentin 100 mg TID - Both PT and OT have evaluated patient and recommending SNF placement with no equipment recommendations  Type 2 DM: HbA1c 7.3 on 67/5  Diastolic CHF: TEE in 91/6384 showed EF 60-65% with grade 2 diastolic dysfunction. She has some bilateral LE edema on exam, but likely related to venous stasis. Denies dyspnea. No pulmonary edema or pleural effusions on CXR on 11/4.  - Continue Bisoprolol 5 mg daily   Discharge Vitals:   BP 113/42 mmHg  Pulse 64  Temp(Src) 98.5 F (36.9 C) (Oral)  Resp 18  Ht 5\' 1"  (1.549 m)  Wt 135 lb 9.3 oz (61.5 kg)  BMI 25.63 kg/m2  SpO2 90%  Discharge Labs:  Results for orders placed or performed during the hospital encounter of 06/08/15 (from the past 24 hour(s))  Glucose, capillary     Status: Abnormal   Collection Time: 06/12/15  4:46 PM  Result Value Ref Range   Glucose-Capillary 321 (H) 65 - 99 mg/dL    Glucose, capillary     Status: Abnormal   Collection Time: 06/12/15  8:30 PM  Result Value Ref Range   Glucose-Capillary 288 (H) 65 - 99 mg/dL  Glucose, capillary     Status: Abnormal   Collection Time: 06/13/15 12:24 AM  Result Value Ref Range   Glucose-Capillary 146 (H) 65 - 99 mg/dL  CBC     Status: Abnormal   Collection Time: 06/13/15  4:01 AM  Result Value Ref Range   WBC 8.3 4.0 - 10.5 K/uL   RBC 3.00 (L) 3.87 - 5.11 MIL/uL   Hemoglobin 9.0 (L) 12.0 - 15.0 g/dL   HCT 29.0 (L) 36.0 - 46.0 %   MCV 96.7 78.0 - 100.0 fL   MCH 30.0 26.0 - 34.0 pg   MCHC 31.0 30.0 - 36.0 g/dL   RDW 18.5 (H) 11.5 - 15.5 %   Platelets 319 150 - 400 K/uL  Basic metabolic panel     Status: Abnormal   Collection Time: 06/13/15  4:01 AM  Result Value Ref Range   Sodium 141 135 - 145 mmol/L   Potassium 4.2 3.5 - 5.1 mmol/L   Chloride 103 101 - 111 mmol/L   CO2 29 22 - 32 mmol/L   Glucose, Bld 109 (H) 65 - 99 mg/dL   BUN 23 (H) 6 - 20 mg/dL   Creatinine, Ser 1.30 (H) 0.44 - 1.00 mg/dL   Calcium 8.8 (L) 8.9 - 10.3 mg/dL   GFR calc non Af  Amer 38 (L) >60 mL/min   GFR calc Af Amer 44 (L) >60 mL/min   Anion gap 9 5 - 15  Glucose, capillary     Status: Abnormal   Collection Time: 06/13/15  4:12 AM  Result Value Ref Range   Glucose-Capillary 112 (H) 65 - 99 mg/dL  Glucose, capillary     Status: Abnormal   Collection Time: 06/13/15  8:02 AM  Result Value Ref Range   Glucose-Capillary 110 (H) 65 - 99 mg/dL  Glucose, capillary     Status: Abnormal   Collection Time: 06/13/15 12:29 PM  Result Value Ref Range   Glucose-Capillary 263 (H) 65 - 99 mg/dL    Signed: Jule Ser, DO 06/13/2015, 2:37 PM    Services Ordered on Discharge: SNF Equipment Ordered on Discharge: none

## 2015-06-13 NOTE — Progress Notes (Signed)
Patient was discharged to nursing home [facility]) by MD order; discharged instructions review and sent to facility by social worker. Sent prescriptions to facility with EMS. PICC-line on LUA capped due to continue IV Abx. Facility was called and report was given to nurse Lattie Haw. Patient will be transported  To facility by her friend.

## 2015-06-13 NOTE — Discharge Instructions (Signed)
[name] was discharged from [facility] in Stable condition.  At the hospital follow up visit please address:  1.  Blood cultures and completion of antibiotics.  Patient to receive 6 total weeks of antibiotics via PICC line (10/26-12/7).  She will be discharged on Cefazolin 2gm q12h.    Also, for lateral left foot wound, please follow WOC recs: have the left lateral foot wound painted daily with betadine to dry this area and have the antibacterial affects, allow to air dry, protect with dry dressing. Change daily.  Monitor BMP to keep potassium level at or equal to 4.0 as patient is on Dofetilide.  Her home meds of Lasix and Hydralazine were held and not continued on discharge.  Her BP has been stable and she is not fluid overloaded.  Please assess whether to continue these as clinically indicated.  Her home dose of MS Contin was halved from 60 to 30mg  BID and she seemed to tolerate this well.  Please continue to assess her pain control.  2.  Labs / imaging needed at time of follow-up: none  3.  Pending labs/ test needing follow-up: blood cultures, sputum culture

## 2015-06-13 NOTE — Consult Note (Addendum)
WOC wound follow up Wound type: left pretibial hematoma, injury during previous hospitalization Left lateral foot wound, S/P I & D per outside MD.    Measurement: Left pretibial: 1cm x 1cm x 0 Left lateral: 3cm x 2.5cm x 0.2cm  Wound bed: left pretibial: purple, skin intact  Left lateral: some bullous material still present but does not appear to be purulent.  It is clear Dark centrally; but not eschar.   Drainage (amount, consistency, odor) none at the pretibial area.  Some serosanguinous drainage at the left lateral foot.  Periwound: intact  Dressing procedure/placement/frequency: Left pretibial -No topical care needed at this time. Can use silicone foam to protect if needed. Will add to have the left lateral foot wound painted daily with betadine to dry this area and have the antibacterial affects, allow to air dry, protect with dry dressing.  Change daily.    Re consult if needed, will not follow at this time. Thanks  Aveline Daus [place], [place] [phone])

## 2015-06-13 NOTE — Progress Notes (Signed)
Patient will discharge to [facility] Anticipated discharge date:06/13/15 Family notified:pt son Transportation by friend  CSW signing off.  Domenica Reamer, [place] Social Worker [phone]

## 2015-06-13 NOTE — Progress Notes (Signed)
  Date: 06/13/2015  Patient name: Cynthia Hicks  Medical record number: [id number]  Date of birth: [birth year]   This patient's plan of care was discussed with the house staff. Please see their note for complete details. I concur with their findings. 1. Sepsis, cellulitis Prev MSSA She will continue the previously recommended 6 weeks of anbx . Will change her to ancef as she had MSSA  2. LLE wound She has a notable wound on her LLE. She will need close f/u for this as it is at risk for putting her foot at risk.   3. DM2  Continue to watch her FSG.  Cont SSI  Appreciate PT/OT eval.   Campbell Riches, MD 06/13/2015, 11:33 AM

## 2015-06-13 NOTE — Progress Notes (Signed)
Physical Therapy Treatment Patient Details Name: Cynthia Hicks MRN: [id number] DOB: 1933-01-15 Today's Date: 06/13/2015    History of Present Illness Ms. Caracci a 81 year old woman with a past medical history of atrial fibrillation on apixibanand dofetilide (CHA2DS2VASc 6), CAD (NSTEMI x2 '07, '13, s/p DES), autoimmune hepatitis (on prednisone), ischemic cardiomyopathy (11/15 TEE 55-65%) who presents with altered mental status from [facility].  According to her patient transfer form, she had been admitted to [facility] from 10/25 to 10/31 for treatment of a MRSA cellulitis of her left foot. She had been discharged to [place] on 10/31, where she had continued to receive ceftriaxone via PICC for this infection.    PT Comments    Pt able to increase ambulation distance today with decreased assistance level.  Con't to recommend SNF.  Follow Up Recommendations  SNF;Supervision/Assistance - 24 hour     Equipment Recommendations  None recommended by PT    Recommendations for Other Services       Precautions / Restrictions Precautions Precautions: Fall Restrictions Weight Bearing Restrictions: No    Mobility  Bed Mobility               General bed mobility comments: sitting up in recliner upon arrival  Transfers Overall transfer level: Needs assistance Equipment used: Rolling walker (2 wheeled) Transfers: Sit to/from Stand Sit to Stand: Min guard         General transfer comment: min/guard with flexed posture  Ambulation/Gait Ambulation/Gait assistance: Min guard Ambulation Distance (Feet): 90 Feet Assistive device: Rolling walker (2 wheeled) Gait Pattern/deviations: Decreased step length - right;Decreased step length - left;Trunk flexed Gait velocity: decreased   General Gait Details: Cues to stay closer to RW. Pt with flexed posture with 2/4 dyspnea with 02 94% on RA (but took several minutes to get reading)   Chief Strategy Officer    Modified Rankin (Stroke Patients Only)       Balance             Standing balance-Leahy Scale: Poor Standing balance comment: requires RW                    Cognition Arousal/Alertness: Awake/alert Behavior During Therapy: WFL for tasks assessed/performed Overall Cognitive Status: Within Functional Limits for tasks assessed                      Exercises General Exercises - Lower Extremity Ankle Circles/Pumps: 5 reps;Both Long Arc Quad: 5 reps;Strengthening;Seated    General Comments General comments (skin integrity, edema, etc.): Pt reports she is feeling better      Pertinent Vitals/Pain Pain Assessment: No/denies pain    Home Living                      Prior Function            PT Goals (current goals can now be found in the care plan section) Acute Rehab PT Goals Patient Stated Goal: to return to her home PT Goal Formulation: With patient Time For Goal Achievement: 06/24/15 Potential to Achieve Goals: Good Progress towards PT goals: Progressing toward goals;Goals met and updated - see care plan    Frequency  Min 3X/week    PT Plan Current plan remains appropriate;Frequency needs to be updated    Co-evaluation  End of Session Equipment Utilized During Treatment: Gait belt Activity Tolerance: Patient tolerated treatment well Patient left: in chair;with call bell/phone within reach;with family/visitor present;with chair alarm set     Time: 5973-3125 PT Time Calculation (min) (ACUTE ONLY): 16 min  Charges:  $Gait Training: 8-22 mins                    G Codes:      Shuntia Exton LUBECK 06/13/2015, 2:43 PM

## 2015-06-13 NOTE — Progress Notes (Signed)
ANTIBIOTIC CONSULT NOTE - FOLLOW UP  Pharmacy Consult for cefazolin Indication: sepsis from cellulitis  Allergies  Allergen Reactions  . Cyclosporine     Blasted bone marrow  . Azathioprine     Other reaction(s): Unknown  . Mycophenolate Other (See Comments)    Pre-op note mentions "intolerance" to mycophenolate.  Unknown reaction, unknown severity.  . Other     Other reaction(s): Other (See Comments) Uncoded Allergy. Allergen: Other Allergy: See Patient Chart for Details, Other Reaction: Toxicity  . Oysters [Shellfish Allergy] Nausea And Vomiting    Patient Measurements: Height: 5\' 1"  (154.9 cm) Weight: 135 lb 9.3 oz (61.5 kg) IBW/kg (Calculated) : 47.8  Vital Signs: Temp: 98.5 F (36.9 C) (11/07 0506) Temp Source: Oral (11/07 0506) BP: 113/42 mmHg (11/07 1312) Pulse Rate: 64 (11/07 1312) Intake/Output from previous day: 11/06 0701 - 11/07 0700 In: 1060 [P.O.:560; IV Piggyback:500] Out: 200 [Urine:200] Intake/Output from this shift: Total I/O In: 382 [P.O.:382] Out: 450 [Urine:450]  Labs:  Recent Labs  06/11/15 0430 06/12/15 0623 06/13/15 0401  WBC 8.3 8.5 8.3  HGB 8.5* 8.7* 9.0*  PLT 275 316 319  CREATININE 1.49* 1.23* 1.30*   Estimated Creatinine Clearance: 29.5 mL/min (by C-G formula based on Cr of 1.3).  Recent Labs  06/12/15 1000  VANCOTROUGH 9*     Microbiology: Recent Results (from the past 720 hour(s))  Culture, blood (routine x 2)     Status: None   Collection Time: 06/08/15  3:55 PM  Result Value Ref Range Status   Specimen Description BLOOD RIGHT HAND  Final   Special Requests IN PEDIATRIC BOTTLE 2CC  Final   Culture NO GROWTH 5 DAYS  Final   Report Status 06/13/2015 FINAL  Final  Culture, blood (routine x 2)     Status: None   Collection Time: 06/08/15  3:59 PM  Result Value Ref Range Status   Specimen Description BLOOD RIGHT ARM  Final   Special Requests BOTTLES DRAWN AEROBIC AND ANAEROBIC 5CC  Final   Culture NO GROWTH 5 DAYS   Final   Report Status 06/13/2015 FINAL  Final  Urine culture     Status: None   Collection Time: 06/08/15  4:12 PM  Result Value Ref Range Status   Specimen Description URINE, RANDOM  Final   Special Requests NONE  Final   Culture 6,000 COLONIES/mL INSIGNIFICANT GROWTH  Final   Report Status 06/09/2015 FINAL  Final  MRSA PCR Screening     Status: None   Collection Time: 06/08/15 10:36 PM  Result Value Ref Range Status   MRSA by PCR NEGATIVE NEGATIVE Final    Comment:        The GeneXpert MRSA Assay (FDA approved for NASAL specimens only), is one component of a comprehensive MRSA colonization surveillance program. It is not intended to diagnose MRSA infection nor to guide or monitor treatment for MRSA infections.   Culture, expectorated sputum-assessment     Status: None   Collection Time: 06/10/15 11:20 AM  Result Value Ref Range Status   Specimen Description SPUTUM  Final   Special Requests Immunocompromised  Final   Sputum evaluation   Final    THIS SPECIMEN IS ACCEPTABLE. RESPIRATORY CULTURE REPORT TO FOLLOW.   Report Status 06/10/2015 FINAL  Final  Culture, respiratory (NON-Expectorated)     Status: None   Collection Time: 06/10/15 11:20 AM  Result Value Ref Range Status   Specimen Description SPUTUM  Final   Special Requests NONE  Final  Gram Stain   Final    ABUNDANT WBC PRESENT, PREDOMINANTLY PMN RARE SQUAMOUS EPITHELIAL CELLS PRESENT FEW YEAST RARE GRAM POSITIVE RODS Performed at [facility]    Culture   Final    NORMAL OROPHARYNGEAL FLORA Performed at [facility]    Report Status 06/12/2015 FINAL  Final    Anti-infectives    Start     Dose/Rate Route Frequency Ordered Stop   06/13/15 1300  ceFAZolin (ANCEF) IVPB 1 g/50 mL premix     1 g 100 mL/hr over 30 Minutes Intravenous Every 12 hours 06/13/15 1156     06/10/15 1000  vancomycin (VANCOCIN) IVPB 1000 mg/200 mL premix  Status:  Discontinued     1,000 mg 200 mL/hr over 60 Minutes  Intravenous Every 48 hours 06/10/15 0723 06/13/15 1128   06/10/15 0730  vancomycin (VANCOCIN) IVPB 1000 mg/200 mL premix  Status:  Discontinued     1,000 mg 200 mL/hr over 60 Minutes Intravenous  Once 06/10/15 0715 06/10/15 0719   06/09/15 0845  piperacillin-tazobactam (ZOSYN) IVPB 3.375 g  Status:  Discontinued     3.375 g 12.5 mL/hr over 240 Minutes Intravenous 3 times per day 06/09/15 0832 06/13/15 1128   06/08/15 1600  vancomycin (VANCOCIN) IVPB 1000 mg/200 mL premix     1,000 mg 200 mL/hr over 60 Minutes Intravenous  Once 06/08/15 1546 06/08/15 1705   06/08/15 1600  piperacillin-tazobactam (ZOSYN) IVPB 3.375 g     3.375 g 100 mL/hr over 30 Minutes Intravenous  Once 06/08/15 1546 06/08/15 1634      Assessment: 81 y/o female admitted with AMS secondary to LLE cellulitis/sepsis. 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 She took ceftriaxone PTA. She was started on vancomcyin and Zosyn here and now is transitioning to cefazolin. ID following and recommends 6 weeks of therapy (10/26 to 12/7). Currently afebrile, WBC normal, and SCr 1.3 with est CrCl ~29 ml/min.  Goal of Therapy:  eradication of infection  Plan:  - Cefazolin 2 g IV q12h - Monitor renal function and adjust dose as needed  [facility], Pharm.D., BCPS Clinical Pharmacist Pager: [phone] 06/13/2015 1:36 PM

## 2015-06-13 NOTE — Evaluation (Signed)
Occupational Therapy Evaluation Patient Details Name: Cynthia Hicks MRN: [id number] DOB: 1932-09-12 Today's Date: 06/13/2015    History of Present Illness Cynthia Hicks a 81 year old woman with a past medical history of atrial fibrillation on apixibanand dofetilide (CHA2DS2VASc 6), CAD (NSTEMI x2 '07, '13, s/p DES), autoimmune hepatitis (on prednisone), ischemic cardiomyopathy (11/15 TEE 55-65%) who presents with altered mental status from [facility].  According to her patient transfer form, she had been admitted to [facility] from 10/25 to 10/31 for treatment of a MRSA cellulitis of her left foot. She had been discharged to [place] on 10/31, where she had continued to receive ceftriaxone via PICC for this infection.   Clinical Impression   Pt admitted with above diagnosis.  Pt currently with functional limitations due to deficits listed below (see OT Problem List).  Pt was able to complete sit > stand with min guard with UE use and dynamic standing with min/steady assist.  Educated on shoulder exercises to continue ROM with pt able to return demonstrate shoulder elevation/depression and rolling forward/backward.  Educated on AE to assist with LB dressing and adaptive techniques for UE dressing and grooming tasks.  Pt will benefit from OT acutely to address shoulder ROM and ADLs until d/c to SNF for further rehab to return home.    Follow Up Recommendations  SNF;Supervision/Assistance - 24 hour    Equipment Recommendations  None recommended by OT    Recommendations for Other Services       Precautions / Restrictions Precautions Precautions: Fall Restrictions Weight Bearing Restrictions: No      Mobility  Transfers Overall transfer level: Needs assistance Equipment used: Rolling walker (2 wheeled) Transfers: Sit to/from Stand Sit to Stand: Min guard         General transfer comment: Pt able to complete sit > stand this session with min  guard/close supervision.  However she did require assist to steady herself once standing with slightly flexed posture throughout.         ADL Overall ADL's : Needs assistance/impaired     Grooming: Min guard;Standing;Minimal assistance   Upper Body Bathing: Standing;Minimal assitance       Upper Body Dressing : Minimal assistance;Standing                     General ADL Comments: Pt required min guard with sit > stand and min/steady assist during standing tasks with and without UE support.     Vision Vision Assessment?: No apparent visual deficits          Pertinent Vitals/Pain Pain Assessment: No/denies pain     Hand Dominance Right   Extremity/Trunk Assessment Upper Extremity Assessment Upper Extremity Assessment: Generalized weakness (limited shoulder ROM due to arthritis in shoulders)   Lower Extremity Assessment Lower Extremity Assessment: Generalized weakness   Cervical / Trunk Assessment Cervical / Trunk Assessment: Kyphotic   Communication Communication Communication: No difficulties   Cognition Arousal/Alertness: Awake/alert Behavior During Therapy: WFL for tasks assessed/performed Overall Cognitive Status: Within Functional Limits for tasks assessed                                Home Living Family/patient expects to be discharged to:: Skilled nursing facility Living Arrangements: Alone Available Help at Discharge: Family;Available PRN/intermittently (2 sons, intermittent supervision) Type of Home: House Home Access: Stairs to enter [employer] of Steps: 3 Entrance Stairs-Rails: Right Home Layout:  One level     Bathroom Shower/Tub: Walk-in shower;Door   [employer] Toilet: Standard Bathroom Accessibility: Yes   Home Equipment: Environmental consultant - 4 wheels;Cane - single point;Wheelchair - manual;Bedside commode   Additional Comments: Pt reports 2 weeks ago able to ambulate with  RW, do own ADLs and do for herself.        Prior Functioning/Environment Level of Independence: Independent with assistive device(s)        Comments: Reports she had been using 4 wheeled RW in house at all times, would stand to shower    OT Diagnosis: Generalized weakness   OT Problem List: Decreased strength;Decreased range of motion;Decreased activity tolerance;Impaired balance (sitting and/or standing);Decreased knowledge of use of DME or AE;Impaired UE functional use;Pain   OT Treatment/Interventions: Self-care/ADL training;Therapeutic exercise;DME and/or AE instruction;Therapeutic activities;Patient/family education;Balance training    OT Goals(Current goals can be found in the care plan section) Acute Rehab OT Goals Patient Stated Goal: to return to her home OT Goal Formulation: With patient Time For Goal Achievement: 06/27/15 Potential to Achieve Goals: Good  OT Frequency: Min 1X/week   Barriers to D/C: Decreased caregiver support  2 sons can provide intermittent supervision    End of Session    Activity Tolerance: Patient tolerated treatment well;No increased pain Patient left: in chair;with call bell/phone within reach   Time: 1121-6244 OT Time Calculation (min): 21 min Charges:  OT General Charges $OT Visit: 1 Procedure OT Evaluation $Initial OT Evaluation Tier I: 1 Procedure G-CodesSimonne Come, [id number] 06/13/2015, 9:27 AM

## 2015-06-14 LAB — GLUCOSE, CAPILLARY
Glucose-Capillary: 99 mg/dL (ref 65–99)
Glucose-Capillary: 267 mg/dL — ABNORMAL HIGH (ref 65–99)

## 2015-06-24 ENCOUNTER — Telehealth: Payer: Self-pay | Admitting: Cardiology

## 2015-06-24 NOTE — Telephone Encounter (Signed)
Please check with her and see what happened.

## 2015-06-24 NOTE — Telephone Encounter (Signed)
Appears pt has no showed twice with Dr Donne Hazel.  Was to be seen for evaluation of cholelithiasis.  Recently in the hospital from 11/2-7 d/t LLE cellulitis.  D/Ced back to SNF.

## 2015-06-24 NOTE — Telephone Encounter (Signed)
Spoke with pt was states she was just too sick to go.  She will call Dr Cristal Generous office to reschedule because she doesn't want to go until after she is d/ed from SNF (date unknown at this point).  Phone to his office given to pt to call at her convenience.

## 2015-06-24 NOTE — Telephone Encounter (Signed)
New Message  Rep from [facility]- Dr Cristal Generous office calling to notify our office that the pt had 'no-showed' her consultation w/ Dr Donne Hazel. Please advise

## 2015-06-29 ENCOUNTER — Encounter: Payer: Self-pay | Admitting: *Deleted

## 2015-07-06 ENCOUNTER — Ambulatory Visit: Payer: Medicare Other | Admitting: Cardiology

## 2015-07-19 ENCOUNTER — Telehealth: Payer: Self-pay

## 2015-07-19 NOTE — Telephone Encounter (Signed)
Tikosyn 136mcg re-ordered through [employer].

## 2015-07-21 ENCOUNTER — Telehealth: Payer: Self-pay

## 2015-07-21 NOTE — Telephone Encounter (Signed)
Patient informed her Phyllis Ginger has arrived for pickup. She will send her son to pick it up.

## 2015-08-03 ENCOUNTER — Telehealth: Payer: Self-pay | Admitting: Gastroenterology

## 2015-08-03 DIAGNOSIS — K831 Obstruction of bile duct: Secondary | ICD-10-CM

## 2015-08-03 NOTE — Telephone Encounter (Signed)
I spoke with [facility] records and requested a copy of the ERCP.    ROV with Dr Ardis Hughs is 08/24/15 at 2:45 pm.    You have been scheduled for an MRI at [facility] on 08/19/15. Your appointment time is 9 am. Please arrive 15 minutes prior to your appointment time for registration purposes. Please make certain not to have anything to eat or drink 6 hours prior to your test. In addition, if you have any metal in your body, have a pacemaker or defibrillator, please be sure to let your ordering physician know. This test typically takes 45 minutes to 1 hour to complete.  Spoke with pt and she was not available to talk and will call back when more convenient

## 2015-08-03 NOTE — Telephone Encounter (Signed)
I spoke with the pt and she is aware of the ROV with Dr Ardis Hughs and has her MRI instructions.  She is also calling [facility] to have the ERCP report faxed to our office.

## 2015-08-03 NOTE — Telephone Encounter (Signed)
Patty,   I reviewed the office note from Dr. Serita Grammes from [facility] surgery. I explored the care everywhere where section of epic and could not find the actual ERCP report from [facility]. There are many comments about the fact that she was having an ERCP but I cannot find the ERCP report or any other documents that explain what was found on that test, procedure. Can you please contact [facility] about getting the actual ERCP report and any associated pathology sent here for my review.   Since it has been 3-4 years since that procedure and there are considerations 4 cholecystectomy now I think her common bile duct should be reevaluated and the best noninvasive way to do that is with MRI with MRCP images. Can you please order MRI with MRCP images to see if she has any retained common bile duct stones.   also , I saw her here in the office several months ago and was planning to do upper endoscopy for some dysphagia symptoms however that procedure was scheduled but never occurred. I cannot tell in epic Hulen Skains was canceled.   She has been pretty ill recently and has an indwelling PICC line for skin infections that seem to be recurrent.    please offer her return office visit with me, within the next 3-4 weeks and double book if needed , please put her on the wait list if she is double booked.

## 2015-08-16 ENCOUNTER — Telehealth: Payer: Self-pay | Admitting: Gastroenterology

## 2015-08-16 NOTE — Telephone Encounter (Signed)
Rec'd from [facility] forward 36 pages to [place]

## 2015-08-17 ENCOUNTER — Telehealth: Payer: Self-pay | Admitting: Gastroenterology

## 2015-08-17 DIAGNOSIS — K8041 Calculus of bile duct with cholecystitis, unspecified, with obstruction: Secondary | ICD-10-CM

## 2015-08-17 NOTE — Telephone Encounter (Signed)
  Pt has been rescheduled to an open MRI and has been instructed.  You have been scheduled for an MRI at [facility] on 08/19/15. Your appointment time is 8:35 am. Please arrive 30 minutes prior to your appointment time for registration purposes. Please make certain not to have anything to eat or drink 6 hours prior to your test. In addition, if you have any metal in your body, have a pacemaker or defibrillator, please be sure to let your ordering physician know. This test typically takes 45 minutes to 1 hour to complete.

## 2015-08-18 ENCOUNTER — Telehealth: Payer: Self-pay | Admitting: Gastroenterology

## 2015-08-18 MED ORDER — DIAZEPAM 5 MG PO TABS: ORAL_TABLET | ORAL | Status: DC

## 2015-08-18 NOTE — Telephone Encounter (Signed)
Pt aware and script sent to pharmacy. Pt knows she needs to have a driver due to the valium.

## 2015-08-18 NOTE — Telephone Encounter (Signed)
Pt states she cannot do her MRI tomorrow without sedation. Please advise.

## 2015-08-18 NOTE — Telephone Encounter (Signed)
OK, please offer her valium 5mg  pills.  2 pills. She should take one pill about 1 hour prior to MRI and the second one after 45 minutes or an hour if she needs it.  Refills none.  She should not drive for several hours after taking the medicine.

## 2015-08-19 ENCOUNTER — Other Ambulatory Visit: Payer: Self-pay | Admitting: Gastroenterology

## 2015-08-19 ENCOUNTER — Ambulatory Visit (HOSPITAL_COMMUNITY): Payer: Medicare Other

## 2015-08-19 ENCOUNTER — Ambulatory Visit
Admission: RE | Admit: 2015-08-19 | Discharge: 2015-08-19 | Disposition: A | Discharge status: Home / Self Care | Payer: Medicare Other | Source: Ambulatory Visit | Attending: Gastroenterology | Admitting: Gastroenterology

## 2015-08-19 DIAGNOSIS — K8041 Calculus of bile duct with cholecystitis, unspecified, with obstruction: Secondary | ICD-10-CM

## 2015-08-23 ENCOUNTER — Telehealth: Payer: Self-pay | Admitting: Gastroenterology

## 2015-08-23 NOTE — Telephone Encounter (Signed)
The pt was called and we discussed at length that Dr Donne Hazel advised that the pt have the MRCP as a non invasive procedure to evaluate the gall stones and bile duct and tomorrow she has an appt with Dr Ardis Hughs to discuss the EGD.  The pt verbalized understanding.  She will keep the appt for tomorrow.

## 2015-08-24 ENCOUNTER — Encounter: Payer: Self-pay | Admitting: Gastroenterology

## 2015-08-24 ENCOUNTER — Ambulatory Visit (INDEPENDENT_AMBULATORY_CARE_PROVIDER_SITE_OTHER): Payer: Medicare Other | Admitting: Gastroenterology

## 2015-08-24 ENCOUNTER — Telehealth: Payer: Self-pay

## 2015-08-24 VITALS — BP 110/44 | HR 72 | Ht 61.0 in | Wt 109.4 lb

## 2015-08-24 DIAGNOSIS — R1310 Dysphagia, unspecified: Secondary | ICD-10-CM

## 2015-08-24 NOTE — Patient Instructions (Signed)
You will be set up for an upper endoscopy with dilation for your dysphagia. We will communicate with Dr. Aundra Dubin about holding your eliquis for 2 days prior to the procedure. You are not having any convincing symptoms of gallbladder problems, do not recommend that you have gallbladder surgery at this time. A copy of this information will be made available to Dr. Donne Hazel.

## 2015-08-24 NOTE — Telephone Encounter (Signed)
08/24/2015   RE: Cynthia Hicks DOB: 1933/04/26 MRN: [id number]   Dear Dr Aundra Dubin,    We have scheduled the above patient for an endoscopic procedure. Our records show that she is on anticoagulation therapy.   Please advise as to how long the patient may come off her therapy of Eliquis prior to the procedure, which is scheduled for 09/09/15.  Please fax back/ or route the response to Ambra Haverstick at [phone].   Sincerely,    Christian Mate RN

## 2015-08-24 NOTE — Progress Notes (Signed)
HPI: This is a    Very pleasant 82 year old woman whom I last saw several months ago.  Chief complaint is dysphasia  She was told she should have her GB removed by2013 ERCP team at [facility]  She was sent to see Dr. Donne Hazel by outside provider to consider gallbladder surgery just recently. He did not feel she was a very good surgical candidate and sent her back here to help determine if it was absolutely necessary that she undergo surgery.  I arranged for MRI with MRCP to reevaluate her bile duct. Those results are below.  She has dysphagia to solid foods for 6-7 years at least.  Occurs about 2-3 times per week.  Was planning to have EGD with me several months ago, however that case was cancelled for unclear reasons.   She has RLQ pains that occurs infrequently.  Overall her weight has been stable.  Walks with a walker.  MRI/MRCP 08/2015: 1. There is a 1.4 cm gallstone in the gallbladder. 2. Chronic extrahepatic and mild chronic intrahepatic biliary dilatation with pneumobilia. I do not see a definite obstructing lesion in the common bile duct. 3. Small right pleural effusion with passive atelectasis. 4. Prominent stool throughout the colon favors constipation. 5. Aortoiliac atherosclerotic vascular disease. 6. Compression fractures at T12 and L3. 7. IV contrast could not be administered due to renal insufficiency.   We have been trying to get her records from [facility] for several months, finally succeeded and I was able to review her ERCP ERCP at [facility] August 2013 done for abnormal MRI. This found cholelithiasis, dilated biliary tree, choledocholithiasis which was treated by biliary sphincterotomy and balloon retrieval.     Past Medical History  Diagnosis Date  . Hypertension   . Arthritis   . Diabetes mellitus   . Coronary artery disease     a.  MI 01/2003;  b.  02/2006 NSTEMI - LAD 80-95m, LCX nl, RCA 59m - RCA stented w/ Taxus DES;   c.  2009 Neg MV;   d. 11/2010 Echo - EF 65%;  e. 02/2013 MV: Carlton Adam Cardiolite that was low risk with no ischemia.  . Autoimmune liver disease     a.  Followed @ [facility] - on chronic prednisone and  sirolimus  . Hyperlipidemia     a. inability to stake statins due to liver disease  . Cellulitis     a. chronic cellulitis of LE  . CKD (chronic kidney disease), stage IV ([place])   . Bulging disc   . Sciatica   . Chronic back pain     a.  On Methadone  . Skin cancer (melanoma) ([place])   . Esophageal stricture     narrowing  . Diabetic neuropathy ([place])   . Sepsis ([place])   . PAF (paroxysmal atrial fibrillation) ([place])     a. Tikosyn initiated 10/2014;  CHA2DS2VASc = 6 (eliquis).  . Chronic diastolic CHF (congestive heart failure) ([place])     a. 06/2014 TEE: EF 60-65%, Gr 2 DD, mild MR, mildly dil LA, Grade IV descending thoracic Ao plaque.    Past Surgical History  Procedure Laterality Date  . Cardiac catheterization      Normal EF  . Coronary angioplasty  2007    2 DES placement to RCA complicated by a wire perforation  . Tubal ligation  1982  . Elbow surgery Left 1994    neuroma removed   . Abscess drainage Right     side  . Lamenectomy  1989    only 1 disc surgery  . Heart stents  2007  and  2012    X's 1  and  2012 X's 2 stents  . Cardioversion N/A 10/31/2012    Procedure: CARDIOVERSION;  Surgeon: Larey Dresser, MD;  Location: [facility] ENDOSCOPY;  Service: Cardiovascular;  Laterality: N/A;  . Tee without cardioversion N/A 06/22/2014    Procedure: TRANSESOPHAGEAL ECHOCARDIOGRAM (TEE);  Surgeon: Larey Dresser, MD;  Location: [facility] ENDOSCOPY;  Service: Cardiovascular;  Laterality: N/A;  . Left heart catheterization with coronary angiogram N/A 08/29/2011    Procedure: LEFT HEART CATHETERIZATION WITH CORONARY ANGIOGRAM;  Surgeon: Wellington Hampshire, MD;  Location: [facility] CATH LAB;  Service: Cardiovascular;  Laterality: N/A;  . Percutaneous coronary stent intervention (pci-s)  08/29/2011    Procedure: PERCUTANEOUS CORONARY  STENT INTERVENTION (PCI-S);  Surgeon: Wellington Hampshire, MD;  Location: [facility] CATH LAB;  Service: Cardiovascular;;    Current Outpatient Prescriptions  Medication Sig Dispense Refill  . alendronate (FOSAMAX) 70 MG tablet Take 70 mg by mouth once a week. Take with a full glass of water on an empty stomach.    Marland Kitchen apixaban (ELIQUIS) 2.5 MG TABS tablet Take 1 tablet (2.5 mg total) by mouth 2 (two) times daily. 180 tablet 3  . Ascorbic Acid (VITAMIN C) 1000 MG tablet Take 1,000 mg by mouth 2 (two) times daily.     . bisoprolol (ZEBETA) 5 MG tablet Take 1 tablet (5 mg total) by mouth daily. 90 tablet 3  . Cholecalciferol (VITAMIN D PO) Take 2,000 Units by mouth at bedtime.     . dofetilide (TIKOSYN) 125 MCG capsule Take 1 capsule (125 mcg total) by mouth 2 (two) times daily. 180 capsule 3  . ezetimibe (ZETIA) 10 MG tablet Take 10 mg by mouth daily.      . Fish Oil-Cholecalciferol (FISH OIL + D3 PO) Take 1 capsule by mouth 2 (two) times daily.    . folic acid (FOLVITE) A999333 MCG tablet Take 400 mcg by mouth daily.    Marland Kitchen gabapentin (NEURONTIN) 100 MG capsule Take 100 mg by mouth 3 (three) times daily.    Marland Kitchen LORazepam (ATIVAN) 1 MG tablet Take 1 mg by mouth every 4 (four) hours as needed for anxiety.    . magnesium oxide (MAG-OX) 400 (241.3 MG) MG tablet Take 1 tablet (400 mg total) by mouth daily. 30 tablet 11  . metolazone (ZAROXOLYN) 2.5 MG tablet Take 2.5 mg by mouth once a week.    . morphine (MS CONTIN) 30 MG 12 hr tablet Take 1 tablet (30 mg total) by mouth 2 (two) times daily. (Patient taking differently: Take 60 mg by mouth 2 (two) times daily. ) 60 tablet 0  . nitroGLYCERIN (NITROSTAT) 0.4 MG SL tablet Place 1 tablet (0.4 mg total) under the tongue every 5 (five) minutes as needed for chest pain. 25 tablet 3  . omeprazole (PRILOSEC) 20 MG capsule Take 20 mg by mouth daily.     Marland Kitchen oxyCODONE (OXY IR/ROXICODONE) 5 MG immediate release tablet Take 1 tablet (5 mg total) by mouth 2 (two) times daily as needed  for moderate pain. 60 tablet 0  . pravastatin (PRAVACHOL) 80 MG tablet Take 1 tablet (80 mg total) by mouth every evening. 90 tablet 3  . predniSONE (DELTASONE) 10 MG tablet Take 10 mg by mouth daily.      Marland Kitchen senna (SENOKOT) 8.6 MG TABS tablet Take 1 tablet by mouth daily as needed for mild constipation.  No current facility-administered medications for this visit.    Allergies as of 08/24/2015 - Review Complete 08/24/2015  Allergen Reaction Noted  . Cyclosporine  06/18/2014  . Azathioprine  03/03/2014  . Mycophenolate Other (See Comments) 06/23/2014  . Other  03/03/2014  . Oysters [shellfish allergy] Nausea And Vomiting 10/19/2014    Family History  Problem Relation Age of Onset  . Lung cancer Mother 80  . Heart attack Father 76  . Heart disease Father   . Congestive Heart Failure Father   . Heart disease Maternal Grandfather   . Hypertension Brother   . Hypertension Son   . Pancreatic cancer Mother     Social History   Social History  . Marital Status: Married    Spouse Name: Jenny Reichmann  . Number of Children: 2  . Years of Education: N/A   Occupational History  . real Sport and exercise psychologist     retired   Social History Main Topics  . Smoking status: Never Smoker   . Smokeless tobacco: Never Used  . Alcohol Use: No  . Drug Use: No  . Sexual Activity: Not on file   Other Topics Concern  . Not on file   Social History Narrative   Pt lives in [place] with husband.  She is retired.  She is not very active @ home.     Physical Exam: BP 88/50 mmHg  Pulse 72  Ht 5\' 1"  (1.549 m)  Wt 109 lb 6 oz (49.612 kg)  BMI 20.68 kg/m2 Constitutional: frail, chronically ill-appearing, walks with a walker Psychiatric: alert and oriented x3 Abdomen: soft, nontender, nondistended, no obvious ascites, no peritoneal signs, normal bowel sounds   Assessment and plan: 82 y.o. female with cholelithiasis, previous choledocholithiasis, dysphasia  MRI with MRCP last week shows no persistent  choledocholithiasis however she still has a 1.4 cm stone in her gallbladder. She was told by her [facility] ERCP team that she should consider gallbladder resection 4 years ago. This was recently reevaluated by Dr. Donne Hazel he felt she might not be a very good surgical candidate which I completely agree with. She has a variety of pains throughout her abdomen but none are convincing for recurrent biliary colic and I advised her against laparoscopic cholecystectomy or any gallbladder procedures unless absolutely necessary (acute cholecystitis). She is a very poor surgical candidate. She does have dysphasia which occurs several times weekly. I was planning to perform EGD and dilation for her several months ago but that procedure was canceled for unclear reasons. We revisited that today and agreed to try again with EGD, dilation at [facility] her soonest convenience. We'll communicate with her cardiologist about the safety of her holding her blood thinner for 2 days prior.   Owens Loffler, MD [facility] 08/24/2015, 3:01 PM

## 2015-08-26 NOTE — Telephone Encounter (Signed)
She can hold Eliquis for 2 days prior to GI procedure.

## 2015-08-29 ENCOUNTER — Encounter (HOSPITAL_COMMUNITY): Payer: Self-pay | Admitting: *Deleted

## 2015-08-29 NOTE — Telephone Encounter (Signed)
The pt was notified to hold eliquis for 2 days prior to the procedure.

## 2015-09-03 ENCOUNTER — Encounter (HOSPITAL_COMMUNITY): Payer: Self-pay | Admitting: Anesthesiology

## 2015-09-03 NOTE — Anesthesia Preprocedure Evaluation (Deleted)
Anesthesia Evaluation  Patient identified by MRN, date of birth, ID band Patient awake    Reviewed: Allergy & Precautions, NPO status , Patient's Chart, lab work & pertinent test results  Airway        Dental   Pulmonary neg pulmonary ROS,           Cardiovascular hypertension, Pt. on medications + CAD, + Past MI, + Peripheral Vascular Disease and +CHF  + dysrhythmias Atrial Fibrillation   HX NSTEMI, STENTS 2009, STRESS 03/2015 EF 44%, Fixed Inferior lateral defect, AF, On Elliquis, Has been cleared for EGD, hold Eliquis x2 days prior   Neuro/Psych  Neuromuscular disease    GI/Hepatic GERD  ,(+) Hepatitis -, AutoimmuneOn prednisone for liver   Endo/Other  diabetes, Type 2, Insulin Dependent  Renal/GU Renal InsufficiencyRenal diseaseCreat 1.3     Musculoskeletal   Abdominal   Peds  Hematology  (+) anemia , 10/30 06/2015   Anesthesia Other Findings   Reproductive/Obstetrics                             Anesthesia Physical Anesthesia Plan  ASA: IV  Anesthesia Plan: MAC   Post-op Pain Management:    Induction: Intravenous  Airway Management Planned: Nasal Cannula  Additional Equipment:   Intra-op Plan:   Post-operative Plan:   Informed Consent: I have reviewed the patients History and Physical, chart, labs and discussed the procedure including the risks, benefits and alternatives for the proposed anesthesia with the patient or authorized representative who has indicated his/her understanding and acceptance.     Plan Discussed with:   Anesthesia Plan Comments: (Cardiology aware of planned procedure, HX CAD, AF, EF 44%, STENTS, On Prednisone for liver, To hold Eliquis x 2 days)        Anesthesia Quick Evaluation

## 2015-09-07 ENCOUNTER — Telehealth: Payer: Self-pay | Admitting: Gastroenterology

## 2015-09-07 ENCOUNTER — Other Ambulatory Visit: Payer: Self-pay

## 2015-09-07 DIAGNOSIS — D509 Iron deficiency anemia, unspecified: Secondary | ICD-10-CM

## 2015-09-08 NOTE — Telephone Encounter (Signed)
Pt aware and will call to reschedule when she is feeling better

## 2015-09-08 NOTE — Telephone Encounter (Signed)
M[place], thanks.  Please ask her to call and reschedule when she is feeling better

## 2015-09-08 NOTE — Telephone Encounter (Signed)
Pt is in the hospital with cellulitis and will need to cancel the EGD.  [facility] endo has been notified.  FYI Dr Ardis Hughs

## 2015-09-09 ENCOUNTER — Ambulatory Visit (HOSPITAL_COMMUNITY): Admission: RE | Admit: 2015-09-09 | Payer: Medicare Other | Source: Ambulatory Visit | Admitting: Gastroenterology

## 2015-09-09 SURGERY — ESOPHAGOGASTRODUODENOSCOPY (EGD) WITH PROPOFOL
Anesthesia: Monitor Anesthesia Care

## 2015-10-07 ENCOUNTER — Encounter: Payer: Medicare Other | Admitting: Gastroenterology

## 2015-10-10 ENCOUNTER — Ambulatory Visit: Payer: Medicare Other | Admitting: Cardiology

## 2015-10-19 ENCOUNTER — Encounter: Payer: Self-pay | Admitting: Cardiology

## 2015-10-24 ENCOUNTER — Other Ambulatory Visit: Payer: Self-pay | Admitting: Orthopedic Surgery

## 2015-10-24 DIAGNOSIS — M25512 Pain in left shoulder: Secondary | ICD-10-CM

## 2015-10-26 ENCOUNTER — Ambulatory Visit (INDEPENDENT_AMBULATORY_CARE_PROVIDER_SITE_OTHER): Payer: Medicare Other | Admitting: Internal Medicine

## 2015-10-26 ENCOUNTER — Ambulatory Visit
Admission: RE | Admit: 2015-10-26 | Discharge: 2015-10-26 | Disposition: A | Discharge status: Home / Self Care | Payer: Medicare Other | Source: Ambulatory Visit | Attending: Internal Medicine | Admitting: Internal Medicine

## 2015-10-26 ENCOUNTER — Telehealth: Payer: Self-pay

## 2015-10-26 ENCOUNTER — Other Ambulatory Visit: Payer: Self-pay | Admitting: Internal Medicine

## 2015-10-26 ENCOUNTER — Encounter: Payer: Self-pay | Admitting: Internal Medicine

## 2015-10-26 VITALS — BP 108/56 | HR 56 | Temp 97.9°F | Wt 108.0 lb

## 2015-10-26 DIAGNOSIS — A4902 Methicillin resistant Staphylococcus aureus infection, unspecified site: Secondary | ICD-10-CM | POA: Diagnosis not present

## 2015-10-26 LAB — BASIC METABOLIC PANEL
BUN: 39 mg/dL — ABNORMAL HIGH (ref 7–25)
CO2: 32 mmol/L — AB (ref 20–31)
Calcium: 8.6 mg/dL (ref 8.6–10.4)
Chloride: 98 mmol/L (ref 98–110)
Creat: 1.5 mg/dL — ABNORMAL HIGH (ref 0.60–0.88)
GLUCOSE: 73 mg/dL (ref 65–99)
POTASSIUM: 4.8 mmol/L (ref 3.5–5.3)
SODIUM: 141 mmol/L (ref 135–146)

## 2015-10-26 LAB — CBC WITH DIFFERENTIAL/PLATELET
BASOS ABS: 0 10*3/uL (ref 0.0–0.1)
BASOS PCT: 0 % (ref 0–1)
EOS ABS: 0 10*3/uL (ref 0.0–0.7)
EOS PCT: 0 % (ref 0–5)
HCT: 34.3 % — ABNORMAL LOW (ref 36.0–46.0)
Hemoglobin: 10.5 g/dL — ABNORMAL LOW (ref 12.0–15.0)
LYMPHS ABS: 1.7 10*3/uL (ref 0.7–4.0)
Lymphocytes Relative: 12 % (ref 12–46)
MCH: 27.3 pg (ref 26.0–34.0)
MCHC: 30.6 g/dL (ref 30.0–36.0)
MCV: 89.1 fL (ref 78.0–100.0)
MPV: 10.7 fL (ref 8.6–12.4)
Monocytes Absolute: 1 10*3/uL (ref 0.1–1.0)
Monocytes Relative: 7 % (ref 3–12)
NEUTROS PCT: 81 % — AB (ref 43–77)
Neutro Abs: 11.2 10*3/uL — ABNORMAL HIGH (ref 1.7–7.7)
PLATELETS: 314 10*3/uL (ref 150–400)
RBC: 3.85 MIL/uL — ABNORMAL LOW (ref 3.87–5.11)
RDW: 21.1 % — AB (ref 11.5–15.5)
WBC: 13.8 10*3/uL — ABNORMAL HIGH (ref 4.0–10.5)

## 2015-10-26 LAB — C-REACTIVE PROTEIN

## 2015-10-26 MED ORDER — MUPIROCIN 2 % EX OINT: 1.0000 "application " | TOPICAL_OINTMENT | Freq: Two times a day (BID) | CUTANEOUS | Status: DC

## 2015-10-26 MED ORDER — SULFAMETHOXAZOLE-TRIMETHOPRIM 400-80 MG PO TABS: 1.0000 | ORAL_TABLET | Freq: Every day | ORAL | Status: DC

## 2015-10-26 MED ORDER — CHLORHEXIDINE GLUCONATE 4 % EX SOLN: 1.0000 "application " | Freq: Every day | CUTANEOUS | Status: DC

## 2015-10-26 NOTE — Telephone Encounter (Addendum)
Faxed release of information form to [facility] [phone]) and [facility] and [facility] [phone]) and [place] to obtain patient's medical records per Dr. Baxter Flattery.

## 2015-10-26 NOTE — Progress Notes (Signed)
RFV: hospital follow up for MSSA sepsis in Oct-Dec 2016 Subjective:    Patient ID: Cynthia Hicks, female    DOB: 1932-12-03, 82 y.o.   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 She was originally admitted at [facility] with MSSA bactereamia but also her wound cx grew MSSA and Capnocytophaga species. She has finished treatment for her infection and osteomyelitis of foot but it is still slow to heal. She was retreated with vancomycin per the patient, though we do not have confirmation, still has picc line in place.  She states that she was sent here for follow up for her foot infection from the skilled facility  She denies any drainage or erythema to her left lateral foot though still has small open indentation similar to a puncture that has been slow to heal over the last 4-5 months.   Allergies  Allergen Reactions  . Cyclosporine     Blasted bone marrow  . Azathioprine     Other reaction(s): Unknown  . Mycophenolate Other (See Comments)    Pre-op note mentions "intolerance" to mycophenolate.  Unknown reaction, unknown severity.  . Other     Other reaction(s): Other (See Comments) Uncoded Allergy. Allergen: Other Allergy: See Patient Chart for Details, Other Reaction: Toxicity  . Oysters [Shellfish Allergy] Nausea And Vomiting   Current Outpatient Prescriptions on File Prior to Visit  Medication Sig Dispense Refill  . alendronate (FOSAMAX) 70 MG tablet Take 70 mg by mouth every Monday. Take with a full glass of water on an empty stomach.    Marland Kitchen apixaban (ELIQUIS) 2.5 MG TABS tablet Take 1 tablet (2.5 mg total) by mouth 2 (two) times daily. 180 tablet 3   . Ascorbic Acid (VITAMIN C) 1000 MG tablet Take 1,000 mg by mouth 2 (two) times daily.     . bisoprolol (ZEBETA) 5 MG tablet Take 1 tablet (5 mg total) by mouth daily. 90 tablet 3  . busPIRone (BUSPAR) 5 MG tablet Take 5 mg by mouth 3 (three) times daily.  1  . Cholecalciferol (VITAMIN D PO) Take 2,000 Units by mouth at bedtime.     . dofetilide (TIKOSYN) 125 MCG capsule Take 1 capsule (125 mcg total) by mouth 2 (two) times daily. 180 capsule 3  . ezetimibe (ZETIA) 10 MG tablet Take 10 mg by mouth daily.      . Fish Oil-Cholecalciferol (FISH OIL + D3 PO) Take 1 capsule by mouth 2 (two) times daily.    . furosemide (LASIX) 40 MG tablet Take 0.5-1 tablets by mouth 2 (two) times daily. Takes one tablet in the morning and then takes half a tablet if needed in the evening.  1  . gabapentin (NEURONTIN) 100 MG capsule Take 100 mg by mouth 3 (three) times daily.    . Insulin Regular Human (RELION R IJ) Inject 8-15 Units as directed 3 (three) times daily. Takes 8 units at breakfast and lunch. Takes 12-15 units at supper per sliding scale.    . magnesium oxide (MAG-OX) 400 (241.3 MG) MG tablet Take 1 tablet (400 mg total) by mouth daily. (Patient taking differently: Take 400 mg by mouth at bedtime. ) 30  tablet 11  . metolazone (ZAROXOLYN) 2.5 MG tablet Take 2.5 mg by mouth every 14 (fourteen) days.     Marland Kitchen morphine (MS CONTIN) 30 MG 12 hr tablet Take 1 tablet (30 mg total) by mouth 2 (two) times daily. (Patient taking differently: Take 60 mg by mouth 2 (two) times daily. ) 60 tablet 0  . morphine (MS CONTIN) 60 MG 12 hr tablet Take 60 mg by mouth every 8 (eight) hours.     . nitroGLYCERIN (NITROSTAT) 0.4 MG SL tablet Place 1 tablet (0.4 mg total) under the tongue every 5 (five) minutes as needed for chest pain. 25 tablet 3  . omeprazole (PRILOSEC) 20 MG capsule Take 20 mg by mouth daily.     Marland Kitchen oxyCODONE (OXY IR/ROXICODONE) 5 MG immediate release tablet Take 1 tablet (5 mg total) by mouth 2 (two) times daily as  needed for moderate pain. 60 tablet 0  . polyvinyl alcohol (LIQUIFILM TEARS) 1.4 % ophthalmic solution Place 1 drop into both eyes daily as needed for dry eyes.    . pravastatin (PRAVACHOL) 80 MG tablet Take 1 tablet (80 mg total) by mouth every evening. 90 tablet 3  . predniSONE (DELTASONE) 10 MG tablet Take 10 mg by mouth daily.       No current facility-administered medications on file prior to visit.   Active Ambulatory Problems    Diagnosis Date Noted  . Diabetes mellitus ([place]) 07/07/2009  . HYPERLIPIDEMIA-MIXED 01/02/2009  . Anemia of chronic disease 07/07/2009  . HTN (hypertension) 07/07/2009  . OTHER NONINFECTIOUS DISORDERS LYMPHATIC CHANNELS 07/21/2009  . VENOUS INSUFFICIENCY, LEGS 01/02/2009  . GERD 07/07/2009  . PSOAS MUSCLE ABSCESS 07/05/2009  . Autoimmune hepatitis ([place]) 07/07/2009  . DEGENERATIVE DISC DISEASE 07/07/2009  . Palpitations 11/06/2010  . Hypoxemia 11/17/2010  . Carotid bruit 07/02/2011  . Coronary artery disease   . ACS (acute coronary syndrome) ([place]) 08/29/2011  . Chronic back pain   . Chronic kidney disease   . Autoimmune liver disease   . NSTEMI (non-ST elevated myocardial infarction) ([place]) 08/30/2011  . Systolic CHF, acute ([place]) 08/30/2011  . Systolic CHF, chronic ([place]) 09/15/2011  . Abdominal pain 12/04/2011  . Atrial fibrillation ([place]) 09/04/2012  . Swelling of limb 10/22/2012  . Pain in limb 10/22/2012  . Lymphedema 10/22/2012  . Long term (current) use of anticoagulants 10/31/2012  . Chronic diastolic CHF (congestive heart failure) ([place]) 05/02/2013  . Polyneuropathy in diabetes(357.2) 03/04/2014  . Abnormal abdominal MRI 03/03/2012  . Arteriosclerosis of coronary artery 04/04/2012  . Cellulitis 07/10/2012  . Biliary calculi, common bile duct 02/28/2013  . Encounter for long-term (current) use of steroids 04/04/2012  . Diabetes mellitus, type 2 ([place]) 04/04/2012  . Fungal infection of nail 02/28/2013  . HLD (hyperlipidemia) 04/04/2012  .  Disease of liver 04/04/2012  . Localized, primary osteoarthritis of shoulder region 11/02/2013  . Heart attack ([place]) 04/04/2012  . H/O immunosuppressive therapy 02/28/2013  . Long term current use of systemic steroids 04/04/2012  . Cellulitis of right lower extremity   . Diabetes mellitus type 2, uncontrolled ([place])   . CAP (community acquired pneumonia)   . Acute on chronic diastolic CHF (congestive heart failure) ([place])   . CKD (chronic kidney disease), stage III 06/24/2014  . Visit for monitoring Tikosyn therapy   . Sepsis ([place]) 06/08/2015  . Sepsis due to methicillin resistant Staphylococcus aureus ([place]) 06/08/2015  . PICC (peripherally inserted central catheter) in place    Resolved Ambulatory Problems    Diagnosis Date  Noted  . Hypoxia 11/16/2010   Past Medical History  Diagnosis Date  . Hypertension   . Arthritis   . Diabetes mellitus   . Hyperlipidemia   . CKD (chronic kidney disease), stage IV ([place])   . Bulging disc   . Sciatica   . Skin cancer (melanoma) ([place])   . Esophageal stricture   . Diabetic neuropathy ([place])   . PAF (paroxysmal atrial fibrillation) ([facility])      Review of Systems No pain to left foot, no fever, chills, nightsweats, no rash to lower extremitites    Objective:   Physical Exam BP 108/56 mmHg  Pulse 56  Temp(Src) 97.9 F (36.6 C) (Oral)  Wt 108 lb (48.988 kg) gen = a xo by 3 in NAD Ext = left foot lateral wound .5cm deep 1.2cm long, dry granulated wound bed no surrounding erythema Right arm picc line is c/d/i Skin = left slightly enlarged in comparison to the right lower leg (she states that is her baseline) + 1 pitting edema. Slight blanching erythema, no raised macular papular rash  Lab Results  Component Value Date   ESRSEDRATE 5 10/26/2015   Lab Results  Component Value Date   CRP <0.5 10/26/2015   Imaging ( in comparison to Nov 2016) CLINICAL DATA: Patient has sustained a puncture wound over the distal fifth meta tarsal 1 month  ago, nonhealing, MRSA infection, diabetes.   FINDINGS: Again noted is osteolysis involving the distal aspect of the shaft and the head of the fifth metatarsal. There is mild narrowing of the adjacent fifth MTP joint. There is an overlying skin ulcer laterally. The bones elsewhere exhibit no acute abnormalities. There is diffuse osteopenia. There are vascular calcifications. No soft tissue gas collections are observed.  IMPRESSION: Findings compatible with osteomyelitis of the fifth metatarsal with adjacent cellulitis and known cutaneous ulcer. There has been slight interval worsening in the appearance of the dystrophic change of the distal aspect of the fifth metatarsal.      Assessment & Plan:  Diabetic osteomyelitis of foot = she has completed addn course of IV abtx. Recommend to discontinue IV PICC line. Will call [facility] home health to see that it is pulled once her IV abtx has stopped (which she reports is this week). Her inflammatory markers have normalized. Recommend to continue with going to wound care in order to help with wound healing.Will get xray of foot to see if signs of osteo.Will get cbc with diff, bmp, and sed rate   MRSA colonzation = we will swab for MRSA, anddecolonizaiton. Will recommend to do body wash, bactrim ss daily (renally dosed), plus mupirocin TID x 10 days.  Cellulitis = it does not appear that she has cellulitis at this time. Her legs slightly erythamatous due to lower extremity edema.  Get copies of last hospitalization from [place]  rtc in 4-6 wk to see if any improvement in wound

## 2015-10-27 LAB — SEDIMENTATION RATE: Sed Rate: 5 mm/hr (ref 0–30)

## 2015-10-29 ENCOUNTER — Other Ambulatory Visit: Payer: Self-pay | Admitting: Cardiology

## 2015-11-02 ENCOUNTER — Telehealth: Payer: Self-pay | Admitting: *Deleted

## 2015-11-02 NOTE — Telephone Encounter (Signed)
Per Dr Baxter Flattery called the patient to check if her PICC was removed and if she is being seen by Wound Care. She advised her PICC was removed and she was told prior to leaving the facility she did not need to follow up with wound care as the spot on her foot has healed. She would like to speak the Dr Baxter Flattery about the labs and scans she had done last week. Advised the patient will let the doctor know and have her call soon.

## 2015-11-02 NOTE — Telephone Encounter (Signed)
i called patient and gave her results

## 2015-11-02 NOTE — Telephone Encounter (Signed)
-----   Message from Carlyle Basques, MD sent at 10/27/2015 11:51 AM EDT ----- Can we make sure she is being seen at [facility] wound [facility] for her left foot.  Also can you make sure that [facility] home [facility] has pulled her picc line if she is not using abtx anymore

## 2015-11-04 ENCOUNTER — Ambulatory Visit
Admission: RE | Admit: 2015-11-04 | Discharge: 2015-11-04 | Disposition: A | Discharge status: Home / Self Care | Payer: Medicare Other | Source: Ambulatory Visit | Attending: Orthopedic Surgery | Admitting: Orthopedic Surgery

## 2015-11-04 DIAGNOSIS — M25512 Pain in left shoulder: Secondary | ICD-10-CM

## 2015-11-08 ENCOUNTER — Encounter: Payer: Self-pay | Admitting: Vascular Surgery

## 2015-11-14 ENCOUNTER — Other Ambulatory Visit: Payer: Self-pay | Admitting: *Deleted

## 2015-11-14 DIAGNOSIS — I872 Venous insufficiency (chronic) (peripheral): Secondary | ICD-10-CM

## 2015-11-16 ENCOUNTER — Ambulatory Visit (INDEPENDENT_AMBULATORY_CARE_PROVIDER_SITE_OTHER): Payer: Medicare Other | Admitting: Vascular Surgery

## 2015-11-16 ENCOUNTER — Ambulatory Visit (HOSPITAL_COMMUNITY)
Admission: RE | Admit: 2015-11-16 | Discharge: 2015-11-16 | Disposition: A | Discharge status: Home / Self Care | Payer: Medicare Other | Source: Ambulatory Visit | Attending: Vascular Surgery | Admitting: Vascular Surgery

## 2015-11-16 ENCOUNTER — Encounter: Payer: Self-pay | Admitting: Vascular Surgery

## 2015-11-16 VITALS — BP 118/44 | HR 66 | Temp 98.4°F | Ht 61.0 in | Wt 113.0 lb

## 2015-11-16 DIAGNOSIS — E1142 Type 2 diabetes mellitus with diabetic polyneuropathy: Secondary | ICD-10-CM | POA: Diagnosis not present

## 2015-11-16 DIAGNOSIS — I872 Venous insufficiency (chronic) (peripheral): Secondary | ICD-10-CM

## 2015-11-16 DIAGNOSIS — N184 Chronic kidney disease, stage 4 (severe): Secondary | ICD-10-CM | POA: Diagnosis not present

## 2015-11-16 DIAGNOSIS — I130 Hypertensive heart and chronic kidney disease with heart failure and stage 1 through stage 4 chronic kidney disease, or unspecified chronic kidney disease: Secondary | ICD-10-CM | POA: Diagnosis not present

## 2015-11-16 DIAGNOSIS — I5032 Chronic diastolic (congestive) heart failure: Secondary | ICD-10-CM | POA: Insufficient documentation

## 2015-11-16 DIAGNOSIS — E785 Hyperlipidemia, unspecified: Secondary | ICD-10-CM | POA: Insufficient documentation

## 2015-11-16 DIAGNOSIS — E1122 Type 2 diabetes mellitus with diabetic chronic kidney disease: Secondary | ICD-10-CM | POA: Diagnosis not present

## 2015-11-16 DIAGNOSIS — I8391 Asymptomatic varicose veins of right lower extremity: Secondary | ICD-10-CM | POA: Diagnosis not present

## 2015-11-16 DIAGNOSIS — R609 Edema, unspecified: Secondary | ICD-10-CM | POA: Diagnosis present

## 2015-11-16 NOTE — Progress Notes (Signed)
[facility] [place]  Patient name: Cynthia Hicks MRN: [id number] DOB: 30-Sep-1932 Sex: female  REASON FOR VISIT: Follow up of chronic venous insufficiency.  HPI: Cynthia Hicks is a 82 y.o. female who I last saw on 11/10/2014. He had been hospitalized several times for cellulitis of the right lower extremity. She had no evidence of DVT at the time of her last visit and reasonable arterial flow by Doppler. She had been using a venous pump. She comes in for a 1 year follow up visit.  Since I saw her last, he has been hospitalized several times for cellulitis of the right leg. In addition, she apparently developed MRSA involving a wound on the lateral aspect of her left foot. She was treated with 6 weeks of clindamycin.  Her only complaint is bilateral lower extreme swelling which is not painful. She denies any claudication or rest pain.  Past Medical History  Diagnosis Date  . Hypertension   . Arthritis   . Diabetes mellitus   . Coronary artery disease     a.  MI 01/2003;  b.  02/2006 NSTEMI - LAD 80-25m, LCX nl, RCA 104m - RCA stented w/ Taxus DES;   c.  2009 Neg MV;  d. 11/2010 Echo - EF 65%;  e. 02/2013 MV: Carlton Adam Cardiolite that was low risk with no ischemia.  . Autoimmune liver disease     a.  Followed @ [facility] - on chronic prednisone and  sirolimus  . Hyperlipidemia     a. inability to stake statins due to liver disease  . Cellulitis     a. chronic cellulitis of LE  . CKD (chronic kidney disease), stage IV ([place])   . Bulging disc   . Sciatica   . Chronic back pain     a.  On Methadone  . Skin cancer (melanoma) ([place])   . Esophageal stricture     narrowing  . Diabetic neuropathy ([place])   . Sepsis ([place])   . PAF (paroxysmal atrial fibrillation) ([place])     a. Tikosyn initiated 10/2014;  CHA2DS2VASc = 6 (eliquis).  . Chronic diastolic CHF (congestive heart failure) ([place])     a. 06/2014 TEE: EF 60-65%, Gr 2 DD, mild MR, mildly dil LA, Grade IV descending thoracic  Ao plaque.    Family History  Problem Relation Age of Onset  . Lung cancer Mother 62  . Heart attack Father 80  . Heart disease Father   . Congestive Heart Failure Father   . Heart disease Maternal Grandfather   . Hypertension Brother   . Hypertension Son   . Pancreatic cancer Mother     SOCIAL HISTORY: Social History  Substance Use Topics  . Smoking status: Never Smoker   . Smokeless tobacco: Never Used  . Alcohol Use: No    Allergies  Allergen Reactions  . Cyclosporine     Blasted bone marrow  . Azathioprine     Other reaction(s): Unknown  . Mycophenolate Other (See Comments)    Pre-op note mentions "intolerance" to mycophenolate.  Unknown reaction, unknown severity.  . Other     Other reaction(s): Other (See Comments) Uncoded Allergy. Allergen: Other Allergy: See Patient Chart for Details, Other Reaction: Toxicity  . Oysters [Shellfish Allergy] Nausea And Vomiting    Current Outpatient Prescriptions  Medication Sig Dispense Refill  . alendronate (FOSAMAX) 70 MG tablet Take 70 mg by mouth every Monday. Take with a full glass of water on an empty stomach.    Marland Kitchen  apixaban (ELIQUIS) 2.5 MG TABS tablet Take 1 tablet (2.5 mg total) by mouth 2 (two) times daily. 180 tablet 3  . Ascorbic Acid (VITAMIN C) 1000 MG tablet Take 1,000 mg by mouth 2 (two) times daily.     . bisoprolol (ZEBETA) 5 MG tablet Take 1 tablet (5 mg total) by mouth daily. 90 tablet 3  . busPIRone (BUSPAR) 5 MG tablet Take 5 mg by mouth 3 (three) times daily.  1  . Cholecalciferol (VITAMIN D PO) Take 2,000 Units by mouth at bedtime.     . dofetilide (TIKOSYN) 125 MCG capsule Take 1 capsule (125 mcg total) by mouth 2 (two) times daily. 180 capsule 3  . ezetimibe (ZETIA) 10 MG tablet Take 10 mg by mouth daily.      . Fish Oil-Cholecalciferol (FISH OIL + D3 PO) Take 1 capsule by mouth daily.     . furosemide (LASIX) 40 MG tablet TAKE 1 TABLET BY MOUTH EVERY MORNING AND 1/2 TABLET BY MOUTH IN THE EVENING 135  tablet 0  . gabapentin (NEURONTIN) 100 MG capsule Take 100 mg by mouth 3 (three) times daily.    . Insulin Regular Human (RELION R IJ) Inject 8-15 Units as directed 3 (three) times daily. Takes 8 units at breakfast and lunch. Takes 12-15 units at supper per sliding scale.    . metolazone (ZAROXOLYN) 2.5 MG tablet Take 2.5 mg by mouth every 14 (fourteen) days.     Marland Kitchen morphine (MS CONTIN) 60 MG 12 hr tablet Take 60 mg by mouth every 8 (eight) hours.     . nitroGLYCERIN (NITROSTAT) 0.4 MG SL tablet Place 1 tablet (0.4 mg total) under the tongue every 5 (five) minutes as needed for chest pain. 25 tablet 3  . omeprazole (PRILOSEC) 20 MG capsule Take 20 mg by mouth daily.     Marland Kitchen oxyCODONE (OXY IR/ROXICODONE) 5 MG immediate release tablet Take 1 tablet (5 mg total) by mouth 2 (two) times daily as needed for moderate pain. 60 tablet 0  . polyvinyl alcohol (LIQUIFILM TEARS) 1.4 % ophthalmic solution Place 1 drop into both eyes daily as needed for dry eyes.    . pravastatin (PRAVACHOL) 80 MG tablet Take 1 tablet (80 mg total) by mouth every evening. 90 tablet 3  . predniSONE (DELTASONE) 10 MG tablet Take 10 mg by mouth daily.      . Chlorhexidine Gluconate 4 % SOLN Apply 1 application topically daily. Wash from head to toes. X 10 days (Patient not taking: Reported on 11/16/2015) 237 mL 1  . magnesium oxide (MAG-OX) 400 (241.3 MG) MG tablet Take 1 tablet (400 mg total) by mouth daily. (Patient not taking: Reported on 11/16/2015) 30 tablet 11  . morphine (MS CONTIN) 30 MG 12 hr tablet Take 1 tablet (30 mg total) by mouth 2 (two) times daily. (Patient not taking: Reported on 11/16/2015) 60 tablet 0  . mupirocin ointment (BACTROBAN) 2 % Place 1 application into the nose 2 (two) times daily. (Patient not taking: Reported on 11/16/2015) 22 g 0  . sulfamethoxazole-trimethoprim (BACTRIM,SEPTRA) 400-80 MG tablet Take 1 tablet by mouth daily. (Patient not taking: Reported on 11/16/2015) 10 tablet 0   No current  facility-administered medications for this visit.    REVIEW OF SYSTEMS:  [X]  denotes positive finding, [ ]  denotes negative finding Cardiac  Comments:  Chest pain or chest pressure:    Shortness of breath upon exertion:    Short of breath when lying flat:    Irregular heart  rhythm:        Vascular    Pain in calf, thigh, or hip brought on by ambulation:    Pain in feet at night that wakes you up from your sleep:     Blood clot in your veins:    Leg swelling:  X       Pulmonary    Oxygen at home:    Productive cough:     Wheezing:         Neurologic    Sudden weakness in arms or legs:     Sudden numbness in arms or legs:     Sudden onset of difficulty speaking or slurred speech:    Temporary loss of vision in one eye:     Problems with dizziness:         Gastrointestinal    Blood in stool:     Vomited blood:         Genitourinary    Burning when urinating:     Blood in urine:        Psychiatric    Major depression:         Hematologic    Bleeding problems:    Problems with blood clotting too easily:        Skin    Rashes or ulcers:        Constitutional    Fever or chills:      PHYSICAL EXAM: Filed Vitals:   11/16/15 1343  BP: 118/44  Pulse: 66  Temp: 98.4 F (36.9 C)  TempSrc: Oral  Height: 5\' 1"  (1.549 m)  Weight: 113 lb (51.256 kg)  SpO2: 98%    GENERAL: The patient is a well-nourished female, in no acute distress. The vital signs are documented above. CARDIAC: There is a regular rate and rhythm.  VASCULAR: do not detect carotid bruits. She has monophasic dorsalis pedis signals with the Doppler and brisk biphasic posterior tibial signals with the Doppler. Has moderate bilateral lower swelling which is more significant on the right side. PULMONARY: There is good air exchange bilaterally without wheezing or rales. ABDOMEN: Soft and non-tender with normal pitched bowel sounds.  MUSCULOSKELETAL: There are no major deformities or  cyanosis. NEUROLOGIC: No focal weakness or paresthesias are detected. SKIN: There is a small dry wound on the lateral aspect of her fifth toe. As no cellulitis or drainage. PSYCHIATRIC: The patient has a normal affect.  DATA:   LOWER EXTREMITY VENOUS DUPLEX: I have independently interpreted her lower extremity venous duplex scan today.  On the right side there is no evidence of DVT. She does have reflux in the common femoral vein on the right only. There is no significant reflux in the great saphenous vein.   MEDICAL ISSUES:  CHRONIC VENOUS INSUFFICIENCY: she has reflux in her right common femoral vein but otherwise no significant distal disease. There is no evidence of DVT in the right lower extremity. We have discussed the importance of intermittent leg elevation in the proper positioning for this. She is unable to put on compression stockings. I would like to see her back in 6 months with ABIs to keep an eye on the wound on her left foot. She appears to have reasonable arterial flow by Doppler but given the wound on the foot I think this needs to be followed closely. She knows to call sooner if she has problems.   Deitra Mayo [facility] [place]: [phone]

## 2015-11-28 ENCOUNTER — Ambulatory Visit (INDEPENDENT_AMBULATORY_CARE_PROVIDER_SITE_OTHER): Payer: Medicare Other | Admitting: Physician Assistant

## 2015-11-28 ENCOUNTER — Encounter: Payer: Self-pay | Admitting: Physician Assistant

## 2015-11-28 VITALS — BP 120/50 | HR 72 | Ht 60.0 in | Wt 109.0 lb

## 2015-11-28 DIAGNOSIS — I890 Lymphedema, not elsewhere classified: Secondary | ICD-10-CM | POA: Diagnosis not present

## 2015-11-28 DIAGNOSIS — I5022 Chronic systolic (congestive) heart failure: Secondary | ICD-10-CM | POA: Diagnosis not present

## 2015-11-28 DIAGNOSIS — I2510 Atherosclerotic heart disease of native coronary artery without angina pectoris: Secondary | ICD-10-CM

## 2015-11-28 DIAGNOSIS — I480 Paroxysmal atrial fibrillation: Secondary | ICD-10-CM | POA: Diagnosis not present

## 2015-11-28 NOTE — Patient Instructions (Addendum)
Medication Instructions:   START TAKING LASIX  60 MG IN THE MORNING   CONTINUE TAKING THE REST OF YOUR  MEDICATIONS AS PRESCRIBED   If you need a refill on your cardiac medications before your next appointment, please call your pharmacy.  Labwork: NONE ORDER TODAY   Testing/Procedures:  NONE ORDER TODAY   Follow-Up:  WITH [facility] IN  3 TO 4 MONTHS    Any Other Special Instructions Will Be Listed Below (If Applicable).  Low-Sodium Eating Plan Sodium raises blood pressure and causes water to be held in the body. Getting less sodium from food will help lower your blood pressure, reduce any swelling, and protect your heart, liver, and kidneys. We get sodium by adding salt (sodium chloride) to food. Most of our sodium comes from canned, boxed, and frozen foods. Restaurant foods, fast foods, and pizza are also very high in sodium. Even if you take medicine to lower your blood pressure or to reduce fluid in your body, getting less sodium from your food is important.  WHAT IS MY PLAN?  Most people should limit their sodium intake to 2,300 mg a day. WHAT DO I NEED TO KNOW ABOUT THIS EATING PLAN?  For the low-sodium eating plan, you will follow these general guidelines:  Choose foods with a % Daily Value for sodium of less than 5% (as listed on the food label).   Use salt-free seasonings or herbs instead of table salt or sea salt.   Check with your health care provider or pharmacist before using salt substitutes.   Eat fresh foods.  Eat more vegetables and fruits.  Limit canned vegetables. If you do use them, rinse them well to decrease the sodium.   Limit cheese to 1 oz (28 g) per day.   Eat lower-sodium products, often labeled as "lower sodium" or "no salt added."  Avoid foods that contain monosodium glutamate (MSG). MSG is sometimes added to [place] food and some canned foods.  Check food labels (Nutrition Facts labels) on foods to learn how much sodium is in one  serving.  Eat more home-cooked food and less restaurant, buffet, and fast food.  When eating at a restaurant, ask that your food be prepared with less salt, or no salt if possible.  HOW DO I READ FOOD LABELS FOR SODIUM INFORMATION? The Nutrition Facts label lists the amount of sodium in one serving of the food. If you eat more than one serving, you must multiply the listed amount of sodium by the number of servings. Food labels may also identify foods as:  Sodium free--Less than 5 mg in a serving.  Very low sodium--35 mg or less in a serving.  Low sodium--140 mg or less in a serving.  Light in sodium--50% less sodium in a serving. For example, if a food that usually has 300 mg of sodium is changed to become light in sodium, it will have 150 mg of sodium.  Reduced sodium--25% less sodium in a serving. For example, if a food that usually has 400 mg of sodium is changed to reduced sodium, it will have 300 mg of sodium. WHAT FOODS CAN I EAT? Grains Low-sodium cereals, including oats, puffed wheat and rice, and shredded wheat cereals. Low-sodium crackers. Unsalted rice and pasta. Lower-sodium bread.  Vegetables Frozen or fresh vegetables. Low-sodium or reduced-sodium canned vegetables. Low-sodium or reduced-sodium tomato sauce and paste. Low-sodium or reduced-sodium tomato and vegetable juices.  Fruits Fresh, frozen, and canned fruit. Fruit juice.  Meat and Other Protein Products  Low-sodium canned tuna and salmon. Fresh or frozen meat, poultry, seafood, and fish. Lamb. Unsalted nuts. Dried beans, peas, and lentils without added salt. Unsalted canned beans. Homemade soups without salt. Eggs.  Dairy Milk. Soy milk. Ricotta cheese. Low-sodium or reduced-sodium cheeses. Yogurt.  Condiments Fresh and dried herbs and spices. Salt-free seasonings. Onion and garlic powders. Low-sodium varieties of mustard and ketchup. Fresh or refrigerated horseradish. Lemon juice.  Fats and  Oils Reduced-sodium salad dressings. Unsalted butter.  Other Unsalted popcorn and pretzels.  The items listed above may not be a complete list of recommended foods or beverages. Contact your dietitian for more options. WHAT FOODS ARE NOT RECOMMENDED? Grains Instant hot cereals. Bread stuffing, pancake, and biscuit mixes. Croutons. Seasoned rice or pasta mixes. Noodle soup cups. Boxed or frozen macaroni and cheese. Self-rising flour. Regular salted crackers. Vegetables Regular canned vegetables. Regular canned tomato sauce and paste. Regular tomato and vegetable juices. Frozen vegetables in sauces. Salted [place] fries. Olives. Angie Fava. Relishes. Sauerkraut. Salsa. Meat and Other Protein Products Salted, canned, smoked, spiced, or pickled meats, seafood, or fish. Bacon, ham, sausage, hot dogs, corned beef, chipped beef, and packaged luncheon meats. Salt pork. Jerky. Pickled herring. Anchovies, regular canned tuna, and sardines. Salted nuts. Dairy Processed cheese and cheese spreads. Cheese curds. Blue cheese and cottage cheese. Buttermilk.  Condiments Onion and garlic salt, seasoned salt, table salt, and sea salt. Canned and packaged gravies. Worcestershire sauce. Tartar sauce. Barbecue sauce. Teriyaki sauce. Soy sauce, including reduced sodium. Steak sauce. Fish sauce. Oyster sauce. Cocktail sauce. Horseradish that you find on the shelf. Regular ketchup and mustard. Meat flavorings and tenderizers. Bouillon cubes. Hot sauce. Tabasco sauce. Marinades. Taco seasonings. Relishes. Fats and Oils Regular salad dressings. Salted butter. Margarine. Ghee. Bacon fat.  Other Potato and tortilla chips. Corn chips and puffs. Salted popcorn and pretzels. Canned or dried soups. Pizza. Frozen entrees and pot pies.  The items listed above may not be a complete list of foods and beverages to avoid. Contact your dietitian for more information.   This information is not intended to replace advice given  to you by your health care provider. Make sure you discuss any questions you have with your health care provider.   Document Released: 01/12/2002 Document Revised: 08/13/2014 Document Reviewed: 05/27/2013 Elsevier Interactive Patient Education [employer].

## 2015-11-28 NOTE — Progress Notes (Signed)
Cardiology Office Note    Date:  11/28/2015   ID:  Cynthia Hicks, DOB 1932/12/26, MRN [id number]  PCP:  Bonnita Nasuti, MD  Cardiologist: Dr. Aundra Dubin   Chief complaint: Swelling of her legs  History of Present Illness:  Cynthia Hicks is a 82 y.o. female with a history of PAF, diastolic HF, PAD, CAD and autoimmune hepatitis.She was admitted in 1/13 with NSTEMI.  Left heart cath showed 80% mid LAD stenosis and 99% mid CFX stenosis (likely culprit).  EF was 40-45% by echo and LV-gram.  She had a PROMUS DES to the CFX.  No intervention to the LAD.   Lexiscan myoview done subsequently in 2/13 showed EF 49% with no ischemia.  Echo 4/13 showed EF around 45-50% with basal to mid inferior and mid-apical posterior akinesis along with mild MR.  She was admitted in 12/13 at [facility] with severe cellulitis and sepsis syndrome.  She was seen here by Truitt Merle later in 12/13 and found to be in atrial fibrillation.  Coumadin was started initially, but now she is on Eliquis. In 3/14, she was cardioverted.  In 7/14, she had an echo showing EF 55-60% with basal inferior hypokinesis and a Lexiscan Cardiolite that was low risk with no ischemia.  She went back into atrial fibrillation in 11/15 but was back in NSR when she showed up for DCCV.  She had to be admitted at that time in 11/15 for PNA.  TEE was done in 11/15 during the PNA admission showing EF 55-60%, mild MR. She was also found to have significant PAD during 11/15 hospitalization.  She was back in atrial fibrillation at her 2/16 appt. In 3/16, she was admitted for Tikosyn and converted back to NSR.  She is in NSR today.  She had a Cardiolite in 8/16 that showed EF 44% with fixed inferior and inferolateral defect, no ischemia.  She was planned for cholecystectomy but ended up being admitted to the hospital in [place] in 8/16 for 12 days with sepsis and PNA, and surgery was put off.    Patient last saw Dr. Aundra Dubin 05/06/15 at which time she was taking Lasix once  a day and metolazone once a week. Her weight was down 17 pounds since her previous appointment. She was having some heart racing up to 130 bpm.  She has since been diagnosed with MRSA involving a wound on the lateral aspect of her left foot felt possibly due to osteomyelitis of the fifth metatarsal with associated cellulitis. She was treated with clindamycin for 6 weeks.  Patient comes in today complaining of swelling in her lower extremities. She has a lymph machine at home that she uses twice a day. She does admit to forgetting to take her evening dose of Lasix and has not taken her Zaroxolyn in a long time. She supposed to take it every 14 days can't remember to do this. Yesterday she ate a can of soup. She is not always following a low-sodium diet. She occasionally has some dyspnea on exertion. Yesterday while eating lunch she had some jaw pain but says it was different than when she had her heart attack. She denies any chest tightness, pressure, dizziness, orthopnea or presyncope she is very weak and gets around with a walker. She has somebody staying with her now to help her at home.      Past Medical History  Diagnosis Date  . Hypertension   . Arthritis   . Diabetes mellitus   .  Coronary artery disease     a.  MI 01/2003;  b.  02/2006 NSTEMI - LAD 80-109m, LCX nl, RCA 16m - RCA stented w/ Taxus DES;   c.  2009 Neg MV;  d. 11/2010 Echo - EF 65%;  e. 02/2013 MV: Carlton Adam Cardiolite that was low risk with no ischemia.  . Autoimmune liver disease     a.  Followed @ [facility] - on chronic prednisone and  sirolimus  . Hyperlipidemia     a. inability to stake statins due to liver disease  . Cellulitis     a. chronic cellulitis of LE  . CKD (chronic kidney disease), stage IV ([place])   . Bulging disc   . Sciatica   . Chronic back pain     a.  On Methadone  . Skin cancer (melanoma) ([place])   . Esophageal stricture     narrowing  . Diabetic neuropathy ([place])   . Sepsis ([place])   . PAF (paroxysmal atrial  fibrillation) ([place])     a. Tikosyn initiated 10/2014;  CHA2DS2VASc = 6 (eliquis).  . Chronic diastolic CHF (congestive heart failure) ([place])     a. 06/2014 TEE: EF 60-65%, Gr 2 DD, mild MR, mildly dil LA, Grade IV descending thoracic Ao plaque.    Past Surgical History  Procedure Laterality Date  . Cardiac catheterization      Normal EF  . Coronary angioplasty  2007    2 DES placement to RCA complicated by a wire perforation  . Tubal ligation  1982  . Elbow surgery Left 1994    neuroma removed   . Abscess drainage Right     side  . Lamenectomy  1989    only 1 disc surgery  . Heart stents  2007  and  2012    X's 1  and  2012 X's 2 stents  . Cardioversion N/A 10/31/2012    Procedure: CARDIOVERSION;  Surgeon: Larey Dresser, MD;  Location: [facility] ENDOSCOPY;  Service: Cardiovascular;  Laterality: N/A;  . Tee without cardioversion N/A 06/22/2014    Procedure: TRANSESOPHAGEAL ECHOCARDIOGRAM (TEE);  Surgeon: Larey Dresser, MD;  Location: [facility] ENDOSCOPY;  Service: Cardiovascular;  Laterality: N/A;  . Left heart catheterization with coronary angiogram N/A 08/29/2011    Procedure: LEFT HEART CATHETERIZATION WITH CORONARY ANGIOGRAM;  Surgeon: Wellington Hampshire, MD;  Location: [place] CATH LAB;  Service: Cardiovascular;  Laterality: N/A;  . Percutaneous coronary stent intervention (pci-s)  08/29/2011    Procedure: PERCUTANEOUS CORONARY STENT INTERVENTION (PCI-S);  Surgeon: Wellington Hampshire, MD;  Location: [facility] CATH LAB;  Service: Cardiovascular;;    Current Medications: Outpatient Prescriptions Prior to Visit  Medication Sig Dispense Refill  . alendronate (FOSAMAX) 70 MG tablet Take 70 mg by mouth every Monday. Take with a full glass of water on an empty stomach.    Marland Kitchen apixaban (ELIQUIS) 2.5 MG TABS tablet Take 1 tablet (2.5 mg total) by mouth 2 (two) times daily. 180 tablet 3  . Ascorbic Acid (VITAMIN C) 1000 MG tablet Take 1,000 mg by mouth 2 (two) times daily.     . bisoprolol (ZEBETA) 5 MG tablet Take 1  tablet (5 mg total) by mouth daily. 90 tablet 3  . Cholecalciferol (VITAMIN D PO) Take 2,000 Units by mouth at bedtime.     . dofetilide (TIKOSYN) 125 MCG capsule Take 1 capsule (125 mcg total) by mouth 2 (two) times daily. 180 capsule 3  . ezetimibe (ZETIA) 10 MG tablet Take 10 mg by mouth  daily.      . Fish Oil-Cholecalciferol (FISH OIL + D3 PO) Take 1 capsule by mouth daily.     Marland Kitchen gabapentin (NEURONTIN) 100 MG capsule Take 100 mg by mouth 3 (three) times daily.    . Insulin Regular Human (RELION R IJ) Inject 8-15 Units as directed 3 (three) times daily. Takes 8 units at breakfast and lunch. Takes 12-15 units at supper per sliding scale.    . magnesium oxide (MAG-OX) 400 (241.3 MG) MG tablet Take 1 tablet (400 mg total) by mouth daily. 30 tablet 11  . metolazone (ZAROXOLYN) 2.5 MG tablet Take 2.5 mg by mouth every 14 (fourteen) days.     . nitroGLYCERIN (NITROSTAT) 0.4 MG SL tablet Place 1 tablet (0.4 mg total) under the tongue every 5 (five) minutes as needed for chest pain. 25 tablet 3  . omeprazole (PRILOSEC) 20 MG capsule Take 20 mg by mouth daily.     Marland Kitchen oxyCODONE (OXY IR/ROXICODONE) 5 MG immediate release tablet Take 1 tablet (5 mg total) by mouth 2 (two) times daily as needed for moderate pain. 60 tablet 0  . polyvinyl alcohol (LIQUIFILM TEARS) 1.4 % ophthalmic solution Place 1 drop into both eyes daily as needed for dry eyes.    . pravastatin (PRAVACHOL) 80 MG tablet Take 1 tablet (80 mg total) by mouth every evening. 90 tablet 3  . predniSONE (DELTASONE) 10 MG tablet Take 10 mg by mouth daily.      . furosemide (LASIX) 40 MG tablet TAKE 1 TABLET BY MOUTH EVERY MORNING AND 1/2 TABLET BY MOUTH IN THE EVENING (Patient taking differently: TAKE 60 MG IN THE AM) 135 tablet 0  . morphine (MS CONTIN) 30 MG 12 hr tablet Take 1 tablet (30 mg total) by mouth 2 (two) times daily. 60 tablet 0  . busPIRone (BUSPAR) 5 MG tablet Take 5 mg by mouth 3 (three) times daily.  1  . Chlorhexidine Gluconate 4 %  SOLN Apply 1 application topically daily. Wash from head to toes. X 10 days (Patient not taking: Reported on 11/16/2015) 237 mL 1  . morphine (MS CONTIN) 60 MG 12 hr tablet Take 60 mg by mouth every 8 (eight) hours.     . mupirocin ointment (BACTROBAN) 2 % Place 1 application into the nose 2 (two) times daily. (Patient not taking: Reported on 11/16/2015) 22 g 0  . sulfamethoxazole-trimethoprim (BACTRIM,SEPTRA) 400-80 MG tablet Take 1 tablet by mouth daily. (Patient not taking: Reported on 11/16/2015) 10 tablet 0   No facility-administered medications prior to visit.     Allergies:   Cyclosporine; Azathioprine; Mycophenolate; Other; and Oysters   Social History   Social History  . Marital Status: Married    Spouse Name: Jenny Reichmann  . Number of Children: 2  . Years of Education: N/A   Occupational History  . real Sport and exercise psychologist     retired   Social History Main Topics  . Smoking status: Never Smoker   . Smokeless tobacco: Never Used  . Alcohol Use: No  . Drug Use: No  . Sexual Activity: Not Asked   Other Topics Concern  . None   Social History Narrative   Pt lives in [place] with husband.  She is retired.  She is not very active @ home.     Family History:  The patient's    family history includes Congestive Heart Failure in her father; Heart attack (age of onset: 30) in her father; Heart disease in her father and maternal  grandfather; Hypertension in her brother and son; Lung cancer (age of onset: 99) in her mother; Pancreatic cancer in her mother.   ROS:   Please see the history of present illness.    Review of Systems  Constitution: Positive for weakness, malaise/fatigue and weight loss.  HENT: Negative.   Eyes: Positive for visual disturbance.  Cardiovascular: Negative.   Respiratory: Negative.   Hematologic/Lymphatic: Bruises/bleeds easily.  Musculoskeletal: Positive for arthritis, back pain, muscle weakness and myalgias. Negative for joint pain.  Gastrointestinal: Negative.    Genitourinary: Negative.    All other systems reviewed and are negative.   PHYSICAL EXAM:   VS:  BP 120/50 mmHg  Pulse 72  Ht 5' (1.524 m)  Wt 109 lb (49.442 kg)  BMI 21.29 kg/m2  SpO2 91%   GEN: Well nourished, well developed, in no acute distress HEENT: normal Neck: no JVD, carotid bruits, or masses Cardiac:  RRR; distant heart sounds no murmurs, rubs, or gallops, chronic lymphedema lower extremities right greater than left, some erythema the right tibia region Respiratory:  Decreased breath sounds but clear  to auscultation bilaterally, normal work of breathing GI: soft, nontender, nondistended, + BS MS: Kyphosis and arthritis  Skin: Bruising of her right arm Neuro:  Alert and Oriented x 3, Strength and sensation are intact Psych: euthymic mood, full affect  Wt Readings from Last 3 Encounters:  11/28/15 109 lb (49.442 kg)  11/16/15 113 lb (51.256 kg)  10/26/15 108 lb (48.988 kg)      Studies/Labs Reviewed:   EKG:  EKG is  ordered today.  The ekg ordered today demonstrates Normal sinus rhythm poor R wave progression, no acute change  Recent Labs: 05/06/2015: Pro B Natriuretic peptide (BNP) 180.0* 06/08/2015: ALT 13* 06/10/2015: Magnesium 1.5* 10/26/2015: BUN 39*; Creat 1.50*; Hemoglobin 10.5*; Platelets 314; Potassium 4.8; Sodium 141   Lipid Panel    Component Value Date/Time   CHOL 170 10/11/2014 1506   TRIG 206.0* 10/11/2014 1506   HDL 73.00 10/11/2014 1506   CHOLHDL 2 10/11/2014 1506   VLDL 41.2* 10/11/2014 1506   LDLCALC 192 08/04/2009 0000   LDLDIRECT 73.0 10/11/2014 1506    Additional studies/ records that were reviewed today include:   2-D echo 06/2014 Study Conclusions  - Left ventricle: The cavity size was normal. Wall thickness was   increased in a pattern of mild LVH. Systolic function was normal.   The estimated ejection fraction was in the range of 60% to 65%.   Wall motion was normal; there were no regional wall motion   abnormalities.  Features are consistent with a pseudonormal left   ventricular filling pattern, with concomitant abnormal relaxation   and increased filling pressure (grade 2 diastolic dysfunction). - Aortic valve: No evidence of vegetation. There was no stenosis.   There was trivial regurgitation. - Aorta: There was prominent (grade IV) plaque in the descending   thoracic aorta. - Mitral valve: No evidence of vegetation. There was mild   regurgitation. - Left atrium: The atrium was mildly dilated. No evidence of   thrombus in the atrial cavity or appendage. - Right ventricle: The cavity size was normal. Systolic function   was normal. - Right atrium: The atrium was mildly dilated. - Atrial septum: No defect or patent foramen ovale was identified.   Echo contrast study showed no right-to-left atrial level shunt,   at baseline or with provocation. - Tricuspid valve: No evidence of vegetation. Peak RV-RA gradient   19 mmHg. - Pulmonic valve: No  evidence of vegetation.  Impressions:  - No evidence of endocarditis.   Nuclear stress test 03/07/15 inferolateral fixed defect consistent with prior MI, no ischemia EF 44%    ASSESSMENT:    1. Lymphedema   2. Systolic CHF, chronic ([place])   3. Coronary artery disease involving native coronary artery of native heart without angina pectoris   4. Paroxysmal atrial fibrillation (HCC)   5. Essential hypertension      PLAN:  In order of problems listed above: Patient has chronic lymphedema use of the lung machine at home. She continues to have swelling that I think is due to dietary indiscretion. She is also forgotten to take her evening dose of Lasix most days and has totally stopped taking Zaroxolyn should take every 14 days. Recommend increasing Lasix to 60 mg in the morning. She should follow-up labs next week. Last creatinine on 10/26/15 was 1.5. 2 g sodium diet. Follow-up with Dr. Algernon Huxley in 2 months  CHF stable  CAD patient did have some jaw pain  yesterday but it was different from her MI pain. EKG without acute change. Patient has not had any further chest pain. Normal nuclear stress test 03/2015  Patient is in normal sinus rhythm today. She is maintained on Tikosyn and Eliquis. QT interval is stable. Recent labs in March were stable.  Blood pressure controlled.    Medication Adjustments/Labs and Tests Ordered: Current medicines are reviewed at length with the patient today.  Concerns regarding medicines are outlined above.  Medication changes, Labs and Tests ordered today are listed in the Patient Instructions below. Patient Instructions  Medication Instructions:   START TAKING LASIX  60 MG IN THE MORNING   CONTINUE TAKING THE REST OF YOUR  MEDICATIONS AS PRESCRIBED   If you need a refill on your cardiac medications before your next appointment, please call your pharmacy.  Labwork: NONE ORDER TODAY   Testing/Procedures:  NONE ORDER TODAY   Follow-Up:  WITH [facility] IN  3 TO 4 MONTHS    Any Other Special Instructions Will Be Listed Below (If Applicable).  Low-Sodium Eating Plan Sodium raises blood pressure and causes water to be held in the body. Getting less sodium from food will help lower your blood pressure, reduce any swelling, and protect your heart, liver, and kidneys. We get sodium by adding salt (sodium chloride) to food. Most of our sodium comes from canned, boxed, and frozen foods. Restaurant foods, fast foods, and pizza are also very high in sodium. Even if you take medicine to lower your blood pressure or to reduce fluid in your body, getting less sodium from your food is important.  WHAT IS MY PLAN?  Most people should limit their sodium intake to 2,300 mg a day. WHAT DO I NEED TO KNOW ABOUT THIS EATING PLAN?  For the low-sodium eating plan, you will follow these general guidelines:  Choose foods with a % Daily Value for sodium of less than 5% (as listed on the food label).   Use salt-free seasonings or  herbs instead of table salt or sea salt.   Check with your health care provider or pharmacist before using salt substitutes.   Eat fresh foods.  Eat more vegetables and fruits.  Limit canned vegetables. If you do use them, rinse them well to decrease the sodium.   Limit cheese to 1 oz (28 g) per day.   Eat lower-sodium products, often labeled as "lower sodium" or "no salt added."  Avoid foods that contain monosodium glutamate (  MSG). MSG is sometimes added to [place] food and some canned foods.  Check food labels (Nutrition Facts labels) on foods to learn how much sodium is in one serving.  Eat more home-cooked food and less restaurant, buffet, and fast food.  When eating at a restaurant, ask that your food be prepared with less salt, or no salt if possible.  HOW DO I READ FOOD LABELS FOR SODIUM INFORMATION? The Nutrition Facts label lists the amount of sodium in one serving of the food. If you eat more than one serving, you must multiply the listed amount of sodium by the number of servings. Food labels may also identify foods as:  Sodium free--Less than 5 mg in a serving.  Very low sodium--35 mg or less in a serving.  Low sodium--140 mg or less in a serving.  Light in sodium--50% less sodium in a serving. For example, if a food that usually has 300 mg of sodium is changed to become light in sodium, it will have 150 mg of sodium.  Reduced sodium--25% less sodium in a serving. For example, if a food that usually has 400 mg of sodium is changed to reduced sodium, it will have 300 mg of sodium. WHAT FOODS CAN I EAT? Grains Low-sodium cereals, including oats, puffed wheat and rice, and shredded wheat cereals. Low-sodium crackers. Unsalted rice and pasta. Lower-sodium bread.  Vegetables Frozen or fresh vegetables. Low-sodium or reduced-sodium canned vegetables. Low-sodium or reduced-sodium tomato sauce and paste. Low-sodium or reduced-sodium tomato and vegetable juices.    Fruits Fresh, frozen, and canned fruit. Fruit juice.  Meat and Other Protein Products Low-sodium canned tuna and salmon. Fresh or frozen meat, poultry, seafood, and fish. Lamb. Unsalted nuts. Dried beans, peas, and lentils without added salt. Unsalted canned beans. Homemade soups without salt. Eggs.  Dairy Milk. Soy milk. Ricotta cheese. Low-sodium or reduced-sodium cheeses. Yogurt.  Condiments Fresh and dried herbs and spices. Salt-free seasonings. Onion and garlic powders. Low-sodium varieties of mustard and ketchup. Fresh or refrigerated horseradish. Lemon juice.  Fats and Oils Reduced-sodium salad dressings. Unsalted butter.  Other Unsalted popcorn and pretzels.  The items listed above may not be a complete list of recommended foods or beverages. Contact your dietitian for more options. WHAT FOODS ARE NOT RECOMMENDED? Grains Instant hot cereals. Bread stuffing, pancake, and biscuit mixes. Croutons. Seasoned rice or pasta mixes. Noodle soup cups. Boxed or frozen macaroni and cheese. Self-rising flour. Regular salted crackers. Vegetables Regular canned vegetables. Regular canned tomato sauce and paste. Regular tomato and vegetable juices. Frozen vegetables in sauces. Salted [place] fries. Olives. Angie Fava. Relishes. Sauerkraut. Salsa. Meat and Other Protein Products Salted, canned, smoked, spiced, or pickled meats, seafood, or fish. Bacon, ham, sausage, hot dogs, corned beef, chipped beef, and packaged luncheon meats. Salt pork. Jerky. Pickled herring. Anchovies, regular canned tuna, and sardines. Salted nuts. Dairy Processed cheese and cheese spreads. Cheese curds. Blue cheese and cottage cheese. Buttermilk.  Condiments Onion and garlic salt, seasoned salt, table salt, and sea salt. Canned and packaged gravies. Worcestershire sauce. Tartar sauce. Barbecue sauce. Teriyaki sauce. Soy sauce, including reduced sodium. Steak sauce. Fish sauce. Oyster sauce. Cocktail sauce.  Horseradish that you find on the shelf. Regular ketchup and mustard. Meat flavorings and tenderizers. Bouillon cubes. Hot sauce. Tabasco sauce. Marinades. Taco seasonings. Relishes. Fats and Oils Regular salad dressings. Salted butter. Margarine. Ghee. Bacon fat.  Other Potato and tortilla chips. Corn chips and puffs. Salted popcorn and pretzels. Canned or dried soups. Pizza. Frozen entrees and pot  pies.  The items listed above may not be a complete list of foods and beverages to avoid. Contact your dietitian for more information.   This information is not intended to replace advice given to you by your health care provider. Make sure you discuss any questions you have with your health care provider.   Document Released: 01/12/2002 Document Revised: 08/13/2014 Document Reviewed: 05/27/2013 Elsevier Interactive Patient Education 2016 [place], Ermalinda Barrios, [place]  11/28/2015 11:19 AM    [facility] [address], [place], [place]  [address] Phone: [phone]; Fax: [phone]

## 2015-11-30 ENCOUNTER — Other Ambulatory Visit: Payer: Self-pay | Admitting: Cardiology

## 2015-12-15 NOTE — Addendum Note (Signed)
Addended by: Dorothyann Gibbs on: 12/15/2015 03:09 PM   Modules accepted: Orders

## 2015-12-16 ENCOUNTER — Other Ambulatory Visit: Payer: Self-pay

## 2015-12-16 MED ORDER — DOFETILIDE 125 MCG PO CAPS: 125.0000 ug | ORAL_CAPSULE | Freq: Two times a day (BID) | ORAL | Status: DC

## 2016-01-05 ENCOUNTER — Ambulatory Visit: Payer: Medicare Other | Admitting: Internal Medicine

## 2016-01-06 ENCOUNTER — Inpatient Hospital Stay (HOSPITAL_COMMUNITY)
Admission: EM | Admit: 2016-01-06 | Discharge: 2016-01-10 | DRG: 549 | Disposition: A | Discharge status: Skilled Nursing Facility | Payer: Medicare Other | Attending: Internal Medicine | Admitting: Internal Medicine

## 2016-01-06 ENCOUNTER — Encounter (HOSPITAL_COMMUNITY): Payer: Self-pay

## 2016-01-06 DIAGNOSIS — E1140 Type 2 diabetes mellitus with diabetic neuropathy, unspecified: Secondary | ICD-10-CM | POA: Diagnosis present

## 2016-01-06 DIAGNOSIS — L03116 Cellulitis of left lower limb: Secondary | ICD-10-CM | POA: Diagnosis present

## 2016-01-06 DIAGNOSIS — Z79899 Other long term (current) drug therapy: Secondary | ICD-10-CM

## 2016-01-06 DIAGNOSIS — L8990 Pressure ulcer of unspecified site, unspecified stage: Secondary | ICD-10-CM | POA: Insufficient documentation

## 2016-01-06 DIAGNOSIS — M545 Low back pain: Secondary | ICD-10-CM | POA: Diagnosis present

## 2016-01-06 DIAGNOSIS — Z7901 Long term (current) use of anticoagulants: Secondary | ICD-10-CM

## 2016-01-06 DIAGNOSIS — M62562 Muscle wasting and atrophy, not elsewhere classified, left lower leg: Secondary | ICD-10-CM | POA: Diagnosis present

## 2016-01-06 DIAGNOSIS — Z955 Presence of coronary angioplasty implant and graft: Secondary | ICD-10-CM

## 2016-01-06 DIAGNOSIS — M00861 Arthritis due to other bacteria, right knee: Secondary | ICD-10-CM | POA: Diagnosis not present

## 2016-01-06 DIAGNOSIS — Z6820 Body mass index (BMI) 20.0-20.9, adult: Secondary | ICD-10-CM

## 2016-01-06 DIAGNOSIS — Z7983 Long term (current) use of bisphosphonates: Secondary | ICD-10-CM

## 2016-01-06 DIAGNOSIS — M25562 Pain in left knee: Secondary | ICD-10-CM

## 2016-01-06 DIAGNOSIS — Z881 Allergy status to other antibiotic agents status: Secondary | ICD-10-CM

## 2016-01-06 DIAGNOSIS — Z91013 Allergy to seafood: Secondary | ICD-10-CM

## 2016-01-06 DIAGNOSIS — N183 Chronic kidney disease, stage 3 unspecified: Secondary | ICD-10-CM | POA: Diagnosis present

## 2016-01-06 DIAGNOSIS — Z888 Allergy status to other drugs, medicaments and biological substances status: Secondary | ICD-10-CM

## 2016-01-06 DIAGNOSIS — M170 Bilateral primary osteoarthritis of knee: Secondary | ICD-10-CM | POA: Diagnosis present

## 2016-01-06 DIAGNOSIS — I480 Paroxysmal atrial fibrillation: Secondary | ICD-10-CM | POA: Diagnosis present

## 2016-01-06 DIAGNOSIS — K754 Autoimmune hepatitis: Secondary | ICD-10-CM | POA: Diagnosis present

## 2016-01-06 DIAGNOSIS — I2510 Atherosclerotic heart disease of native coronary artery without angina pectoris: Secondary | ICD-10-CM | POA: Diagnosis present

## 2016-01-06 DIAGNOSIS — M25512 Pain in left shoulder: Secondary | ICD-10-CM | POA: Diagnosis present

## 2016-01-06 DIAGNOSIS — L89891 Pressure ulcer of other site, stage 1: Secondary | ICD-10-CM | POA: Diagnosis present

## 2016-01-06 DIAGNOSIS — I890 Lymphedema, not elsewhere classified: Secondary | ICD-10-CM | POA: Diagnosis present

## 2016-01-06 DIAGNOSIS — I130 Hypertensive heart and chronic kidney disease with heart failure and stage 1 through stage 4 chronic kidney disease, or unspecified chronic kidney disease: Secondary | ICD-10-CM | POA: Diagnosis present

## 2016-01-06 DIAGNOSIS — M25561 Pain in right knee: Secondary | ICD-10-CM | POA: Diagnosis not present

## 2016-01-06 DIAGNOSIS — E1151 Type 2 diabetes mellitus with diabetic peripheral angiopathy without gangrene: Secondary | ICD-10-CM | POA: Diagnosis present

## 2016-01-06 DIAGNOSIS — L03119 Cellulitis of unspecified part of limb: Secondary | ICD-10-CM

## 2016-01-06 DIAGNOSIS — R52 Pain, unspecified: Secondary | ICD-10-CM

## 2016-01-06 DIAGNOSIS — Z794 Long term (current) use of insulin: Secondary | ICD-10-CM

## 2016-01-06 DIAGNOSIS — L89152 Pressure ulcer of sacral region, stage 2: Secondary | ICD-10-CM | POA: Diagnosis present

## 2016-01-06 DIAGNOSIS — K219 Gastro-esophageal reflux disease without esophagitis: Secondary | ICD-10-CM | POA: Diagnosis present

## 2016-01-06 DIAGNOSIS — E119 Type 2 diabetes mellitus without complications: Secondary | ICD-10-CM

## 2016-01-06 DIAGNOSIS — I252 Old myocardial infarction: Secondary | ICD-10-CM

## 2016-01-06 DIAGNOSIS — E1122 Type 2 diabetes mellitus with diabetic chronic kidney disease: Secondary | ICD-10-CM | POA: Diagnosis present

## 2016-01-06 DIAGNOSIS — I10 Essential (primary) hypertension: Secondary | ICD-10-CM | POA: Diagnosis present

## 2016-01-06 DIAGNOSIS — E440 Moderate protein-calorie malnutrition: Secondary | ICD-10-CM | POA: Diagnosis present

## 2016-01-06 DIAGNOSIS — B965 Pseudomonas (aeruginosa) (mallei) (pseudomallei) as the cause of diseases classified elsewhere: Secondary | ICD-10-CM | POA: Diagnosis present

## 2016-01-06 DIAGNOSIS — M009 Pyogenic arthritis, unspecified: Secondary | ICD-10-CM

## 2016-01-06 DIAGNOSIS — M62561 Muscle wasting and atrophy, not elsewhere classified, right lower leg: Secondary | ICD-10-CM | POA: Diagnosis present

## 2016-01-06 DIAGNOSIS — L03115 Cellulitis of right lower limb: Secondary | ICD-10-CM | POA: Diagnosis present

## 2016-01-06 DIAGNOSIS — M00862 Arthritis due to other bacteria, left knee: Secondary | ICD-10-CM | POA: Diagnosis present

## 2016-01-06 DIAGNOSIS — G8929 Other chronic pain: Secondary | ICD-10-CM | POA: Diagnosis present

## 2016-01-06 DIAGNOSIS — I5032 Chronic diastolic (congestive) heart failure: Secondary | ICD-10-CM | POA: Diagnosis present

## 2016-01-06 DIAGNOSIS — Z7952 Long term (current) use of systemic steroids: Secondary | ICD-10-CM

## 2016-01-06 DIAGNOSIS — N184 Chronic kidney disease, stage 4 (severe): Secondary | ICD-10-CM | POA: Diagnosis present

## 2016-01-06 DIAGNOSIS — E785 Hyperlipidemia, unspecified: Secondary | ICD-10-CM | POA: Diagnosis present

## 2016-01-06 DIAGNOSIS — M549 Dorsalgia, unspecified: Secondary | ICD-10-CM

## 2016-01-06 LAB — C-REACTIVE PROTEIN: CRP: 15.8 mg/dL — AB (ref <1.0)

## 2016-01-06 LAB — BASIC METABOLIC PANEL
ANION GAP: 9 (ref 5–15)
BUN: 50 mg/dL — AB (ref 6–20)
CHLORIDE: 99 mmol/L — AB (ref 101–111)
CO2: 29 mmol/L (ref 22–32)
Calcium: 7.9 mg/dL — ABNORMAL LOW (ref 8.9–10.3)
Creatinine, Ser: 1.26 mg/dL — ABNORMAL HIGH (ref 0.44–1.00)
GFR calc Af Amer: 45 mL/min — ABNORMAL LOW (ref >60)
GFR, EST NON AFRICAN AMERICAN: 39 mL/min — AB (ref >60)
GLUCOSE: 491 mg/dL — AB (ref 65–99)
POTASSIUM: 4.8 mmol/L (ref 3.5–5.1)
Sodium: 137 mmol/L (ref 135–145)

## 2016-01-06 LAB — URINALYSIS, ROUTINE W REFLEX MICROSCOPIC
BILIRUBIN URINE: NEGATIVE
Glucose, UA: >1000 mg/dL — AB
HGB URINE DIPSTICK: NEGATIVE
KETONES UR: NEGATIVE mg/dL
Leukocytes, UA: NEGATIVE
NITRITE: NEGATIVE
Protein, ur: NEGATIVE mg/dL
Specific Gravity, Urine: 1.02 (ref 1.005–1.030)
pH: 6.5 (ref 5.0–8.0)

## 2016-01-06 LAB — URIC ACID: URIC ACID, SERUM: 8.7 mg/dL — AB (ref 2.3–6.6)

## 2016-01-06 LAB — GLUCOSE, CAPILLARY
GLUCOSE-CAPILLARY: 460 mg/dL — AB (ref 65–99)
Glucose-Capillary: 192 mg/dL — ABNORMAL HIGH (ref 65–99)

## 2016-01-06 LAB — CBC
HEMATOCRIT: 32.2 % — AB (ref 36.0–46.0)
HEMOGLOBIN: 9.7 g/dL — AB (ref 12.0–15.0)
MCH: 28.4 pg (ref 26.0–34.0)
MCHC: 30.1 g/dL (ref 30.0–36.0)
MCV: 94.2 fL (ref 78.0–100.0)
PLATELETS: 213 10*3/uL (ref 150–400)
RBC: 3.42 MIL/uL — AB (ref 3.87–5.11)
RDW: 21.4 % — ABNORMAL HIGH (ref 11.5–15.5)
WBC: 24.6 10*3/uL — AB (ref 4.0–10.5)

## 2016-01-06 LAB — URINE MICROSCOPIC-ADD ON

## 2016-01-06 LAB — SEDIMENTATION RATE: SED RATE: 100 mm/h — AB (ref 0–22)

## 2016-01-06 LAB — I-STAT CG4 LACTIC ACID, ED: Lactic Acid, Venous: 2.12 mmol/L (ref 0.5–2.0)

## 2016-01-06 MED ORDER — MORPHINE SULFATE ER 30 MG PO TBCR
60.0000 mg | EXTENDED_RELEASE_TABLET | Freq: Three times a day (TID) | ORAL | Status: DC
  Administered 2016-01-06 – 2016-01-08 (×5): 60 mg via ORAL
  Filled 2016-01-06 (×6): qty 2

## 2016-01-06 MED ORDER — PREDNISONE 20 MG PO TABS
10.0000 mg | ORAL_TABLET | Freq: Every day | ORAL | Status: DC
  Administered 2016-01-07: 10 mg via ORAL
  Filled 2016-01-06: qty 1

## 2016-01-06 MED ORDER — GABAPENTIN 100 MG PO CAPS
100.0000 mg | ORAL_CAPSULE | Freq: Three times a day (TID) | ORAL | Status: DC
  Administered 2016-01-06: 100 mg via ORAL
  Filled 2016-01-06 (×2): qty 1

## 2016-01-06 MED ORDER — VANCOMYCIN HCL 500 MG IV SOLR
500.0000 mg | INTRAVENOUS | Status: DC
  Administered 2016-01-07 – 2016-01-08 (×2): 500 mg via INTRAVENOUS
  Filled 2016-01-06 (×5): qty 500

## 2016-01-06 MED ORDER — PIPERACILLIN-TAZOBACTAM 3.375 G IVPB 30 MIN
3.3750 g | Freq: Once | INTRAVENOUS | Status: AC
  Administered 2016-01-06: 3.375 g via INTRAVENOUS
  Filled 2016-01-06: qty 50

## 2016-01-06 MED ORDER — LOSARTAN POTASSIUM 25 MG PO TABS
25.0000 mg | ORAL_TABLET | Freq: Every day | ORAL | Status: DC
  Filled 2016-01-06 (×2): qty 1

## 2016-01-06 MED ORDER — VITAMIN D 1000 UNITS PO TABS
2000.0000 [IU] | ORAL_TABLET | Freq: Every day | ORAL | Status: DC
Start: 2016-01-06 — End: 2016-01-10
  Administered 2016-01-06 – 2016-01-09 (×4): 2000 [IU] via ORAL
  Filled 2016-01-06 (×4): qty 2

## 2016-01-06 MED ORDER — ENOXAPARIN SODIUM 30 MG/0.3ML ~~LOC~~ SOLN: 30.0000 mg | SUBCUTANEOUS | Status: DC

## 2016-01-06 MED ORDER — DOFETILIDE 125 MCG PO CAPS
125.0000 ug | ORAL_CAPSULE | Freq: Two times a day (BID) | ORAL | Status: DC
  Administered 2016-01-06 – 2016-01-10 (×9): 125 ug via ORAL
  Filled 2016-01-06 (×8): qty 1

## 2016-01-06 MED ORDER — PIPERACILLIN-TAZOBACTAM 3.375 G IVPB
3.3750 g | Freq: Three times a day (TID) | INTRAVENOUS | Status: DC
  Administered 2016-01-06 – 2016-01-08 (×5): 3.375 g via INTRAVENOUS
  Filled 2016-01-06 (×7): qty 50

## 2016-01-06 MED ORDER — INSULIN ASPART 100 UNIT/ML ~~LOC~~ SOLN
0.0000 [IU] | Freq: Three times a day (TID) | SUBCUTANEOUS | Status: DC
  Administered 2016-01-06: 20 [IU] via SUBCUTANEOUS
  Administered 2016-01-07: 4 [IU] via SUBCUTANEOUS
  Administered 2016-01-07: 11 [IU] via SUBCUTANEOUS
  Administered 2016-01-08: 15 [IU] via SUBCUTANEOUS
  Administered 2016-01-08: 4 [IU] via SUBCUTANEOUS
  Administered 2016-01-09: 15 [IU] via SUBCUTANEOUS
  Administered 2016-01-09 (×2): 4 [IU] via SUBCUTANEOUS
  Administered 2016-01-10: 20 [IU] via SUBCUTANEOUS
  Administered 2016-01-10: 3 [IU] via SUBCUTANEOUS

## 2016-01-06 MED ORDER — VANCOMYCIN HCL IN DEXTROSE 1-5 GM/200ML-% IV SOLN
1000.0000 mg | Freq: Once | INTRAVENOUS | Status: AC
  Administered 2016-01-06: 1000 mg via INTRAVENOUS
  Filled 2016-01-06: qty 200

## 2016-01-06 MED ORDER — BISOPROLOL FUMARATE 5 MG PO TABS
5.0000 mg | ORAL_TABLET | Freq: Every day | ORAL | Status: DC
  Filled 2016-01-06: qty 1

## 2016-01-06 MED ORDER — ENOXAPARIN SODIUM 30 MG/0.3ML ~~LOC~~ SOLN
30.0000 mg | SUBCUTANEOUS | Status: DC
  Administered 2016-01-06 – 2016-01-07 (×2): 30 mg via SUBCUTANEOUS
  Filled 2016-01-06 (×2): qty 0.3

## 2016-01-06 MED ORDER — INSULIN ASPART 100 UNIT/ML ~~LOC~~ SOLN: 0.0000 [IU] | Freq: Three times a day (TID) | SUBCUTANEOUS | Status: DC

## 2016-01-06 MED ORDER — SODIUM CHLORIDE 0.9 % IV BOLUS (SEPSIS)
500.0000 mL | Freq: Once | INTRAVENOUS | Status: AC
  Administered 2016-01-06: 500 mL via INTRAVENOUS

## 2016-01-06 MED ORDER — GI COCKTAIL ~~LOC~~
30.0000 mL | Freq: Once | ORAL | Status: AC
  Administered 2016-01-06: 30 mL via ORAL
  Filled 2016-01-06: qty 30

## 2016-01-06 NOTE — ED Notes (Signed)
Pt presents with 2 week h/o bilateral leg redness, pain and swelling.  Pt is being treated for gout with prednisone by PCP x 3-4 days.  Pt not able to ambulate.

## 2016-01-06 NOTE — H&P (Signed)
Date: 01/06/2016               Patient Name:  Cynthia Hicks MRN: [id number]  DOB: 1933/02/06 Age / Sex: 82 y.o., female   PCP: Raelyn Number, MD         Medical Service: Internal Medicine Teaching Service         Attending Physician: Dr. Annia Belt, MD    First Contact: Dr. Liberty Handy Pager: [phone]  Second Contact: Dr. Julious Oka Pager: [phone]       After Hours (After 5p/  First Contact Pager: [phone]  weekends / holidays): Second Contact Pager: [phone]   Chief Complaint: Knee Pain  History of Present Illness:   Cynthia Hicks is an 82 year old woman with a PMH of autoimmune hepatitis on chronic steroids, lymphedema of right leg, recurrent cellulitis, MSSA sepsis, CKD Stage 4, PAF (on Eliquis, dofetilide), PAD, CAD (s/p RCA, LCx DES) who comes to the hospital complaining of bilateral knee pain. The pain started approximately two weeks ago shortly after she was discharged from [facility], where she was treated for a cellulitis with vancomycin and discharged on clindamycin. It started as a pain in her right knee and she went to her PCP who noted a high uric acid and presumed she had a gout flare. She has never had gout before and no knee aspiration was performed. She was started on a 5 day course of prednisone, 50 mg daily in addition to her 10 mg daily. Her pain continued to worsen and she noticed she was having pain in her left knee. She called her PCP a week later who started her on 70 mg prednisone with a 10 mg daily taper to her normal dose of 10 mg, which she completed. She was also given a single dose of colchicine. She noticed no improvement in her symptoms and her knee pain continued to worsen, and now she is unable to walk. She has noticed a red area start to appear over both knees as well. She also describes longstanding lymphedema, redness, and swelling in both legs. She has not noticed any changes in her lower extremity swelling or redness. She has not using her  "leg pump" for her swelling due to the knee pain.   She denies any headache, light-headedness, fevers, dyspnea, cough, chest pain, palpitations, n/v/d, dysuria, changes in urination, blackouts, confusion, or falls. She was complaining of some "heartburn."  In the ED, it was presumed the patient had lower extremity cellulitis and vancomycin and Zosyn were ordered. She was afebrile with a WBC of 24.6. Lactate of 2.12.   Meds: Current Facility-Administered Medications  Medication Dose Route Frequency Provider Last Rate Last Dose  . [START ON 01/07/2016] bisoprolol (ZEBETA) tablet 5 mg  5 mg Oral Daily Norman Herrlich, MD      . cholecalciferol (VITAMIN D) tablet 2,000 Units  2,000 Units Oral QHS Norman Herrlich, MD      . dofetilide [facility]) capsule 125 mcg  125 mcg Oral BID Norman Herrlich, MD      . enoxaparin (LOVENOX) injection 30 mg  30 mg Subcutaneous Q24H Otilio Miu, RPH      . gabapentin (NEURONTIN) capsule 100 mg  100 mg Oral TID Norman Herrlich, MD      . gi cocktail (Maalox,Lidocaine,Donnatal)  30 mL Oral Once Norman Herrlich, MD      . insulin aspart (novoLOG) injection 0-9 Units  0-9 Units Subcutaneous TID WC  Norman Herrlich, MD      . losartan (COZAAR) tablet 25 mg  25 mg Oral Daily Norman Herrlich, MD      . morphine (MS CONTIN) 12 hr tablet 60 mg  60 mg Oral Q8H Norman Herrlich, MD      . piperacillin-tazobactam (ZOSYN) IVPB 3.375 g  3.375 g Intravenous Q8H Rachel L Rumbarger, RPH      . [START ON 01/07/2016] predniSONE (DELTASONE) tablet 10 mg  10 mg Oral Q breakfast Norman Herrlich, MD      . sodium chloride 0.9 % bolus 500 mL  500 mL Intravenous Once Norman Herrlich, MD      . Derrill Memo ON 01/07/2016] vancomycin (VANCOCIN) 500 mg in sodium chloride 0.9 % 100 mL IVPB  500 mg Intravenous Q24H Valeda Malm Rumbarger, RPH        Allergies: Allergies as of 01/06/2016 - Review Complete 01/06/2016  Allergen Reaction Noted  . Cyclosporine  06/18/2014  . Azathioprine  03/03/2014  . Mycophenolate  Other (See Comments) 06/23/2014  . Other  03/03/2014  . Oysters [shellfish allergy] Nausea And Vomiting 10/19/2014   Past Medical History  Diagnosis Date  . Hypertension   . Arthritis   . Diabetes mellitus   . Coronary artery disease     a.  MI 01/2003;  b.  02/2006 NSTEMI - LAD 80-75m, LCX nl, RCA 26m - RCA stented w/ Taxus DES;   c.  2009 Neg MV;  d. 11/2010 Echo - EF 65%;  e. 02/2013 MV: Carlton Adam Cardiolite that was low risk with no ischemia.  . Autoimmune liver disease     a.  Followed @ [facility] - on chronic prednisone and  sirolimus  . Hyperlipidemia     a. inability to stake statins due to liver disease  . Cellulitis     a. chronic cellulitis of LE  . CKD (chronic kidney disease), stage IV ([place])   . Bulging disc   . Sciatica   . Chronic back pain     a.  On Methadone  . Skin cancer (melanoma) ([place])   . Esophageal stricture     narrowing  . Diabetic neuropathy ([place])   . Sepsis ([place])   . PAF (paroxysmal atrial fibrillation) ([place])     a. Tikosyn initiated 10/2014;  CHA2DS2VASc = 6 (eliquis).  . Chronic diastolic CHF (congestive heart failure) ([place])     a. 06/2014 TEE: EF 60-65%, Gr 2 DD, mild MR, mildly dil LA, Grade IV descending thoracic Ao plaque.   Past Surgical History  Procedure Laterality Date  . Cardiac catheterization      Normal EF  . Coronary angioplasty  2007    2 DES placement to RCA complicated by a wire perforation  . Tubal ligation  1982  . Elbow surgery Left 1994    neuroma removed   . Abscess drainage Right     side  . Lamenectomy  1989    only 1 disc surgery  . Heart stents  2007  and  2012    X's 1  and  2012 X's 2 stents  . Cardioversion N/A 10/31/2012    Procedure: CARDIOVERSION;  Surgeon: Larey Dresser, MD;  Location: [facility] ENDOSCOPY;  Service: Cardiovascular;  Laterality: N/A;  . Tee without cardioversion N/A 06/22/2014    Procedure: TRANSESOPHAGEAL ECHOCARDIOGRAM (TEE);  Surgeon: Larey Dresser, MD;  Location: [place];  Service:  Cardiovascular;  Laterality: N/A;  . Left heart catheterization with coronary angiogram  N/A 08/29/2011    Procedure: LEFT HEART CATHETERIZATION WITH CORONARY ANGIOGRAM;  Surgeon: Wellington Hampshire, MD;  Location: [place] CATH LAB;  Service: Cardiovascular;  Laterality: N/A;  . Percutaneous coronary stent intervention (pci-s)  08/29/2011    Procedure: PERCUTANEOUS CORONARY STENT INTERVENTION (PCI-S);  Surgeon: Wellington Hampshire, MD;  Location: [facility] CATH LAB;  Service: Cardiovascular;;   Family History  Problem Relation Age of Onset  . Lung cancer Mother 9  . Heart attack Father 56  . Heart disease Father   . Congestive Heart Failure Father   . Heart disease Maternal Grandfather   . Hypertension Brother   . Hypertension Son   . Pancreatic cancer Mother    Social History   Social History  . Marital Status: Married    Spouse Name: Jenny Reichmann  . Number of Children: 2  . Years of Education: N/A   Occupational History  . real Sport and exercise psychologist     retired   Social History Main Topics  . Smoking status: Never Smoker   . Smokeless tobacco: Never Used  . Alcohol Use: No  . Drug Use: No  . Sexual Activity: Not on file   Other Topics Concern  . Not on file   Social History Narrative   Pt lives in [place] with husband.  She is retired.  She is not very active @ home.    Review of Systems: All systems negative except per HPI.   Physical Exam: Blood pressure 159/60, pulse 76, temperature 97.7 F (36.5 C), temperature source Oral, resp. rate 18, height 5\' 1"  (1.549 m), weight 107 lb (48.535 kg), SpO2 98 %. General: Lying in bed, not diaphoretic  HEENT: Dry MM, no tonsillar exudate or erythema, EOMI, no scleral icterus Cardiovascular: Regular rate and rhythm. No murmurs, rubs, gallops. Pulmonary: CTAB. Unlabored breathing. Abdominal: Soft NT/ND. Normal bowel sounds. Extremities and Skin: Erythematous lower extremities with pitting edema below the tibial tuberosity, erythema overlying the inferior  patellae bilaterally. Superficial left heal ulceration. Musculoskeletal: No appreciable fluctuance over the left and right knee. Significant tenderness to palpation in both knees. Limited range of motion. No significant swelling in the knee. Neurological: AAOx3, tongue midline, face symmetric Psychiatric: Normal behavior and affect   Lab results: Basic Metabolic Panel:  Recent Labs  01/06/16 1345  NA 137  K 4.8  CL 99*  CO2 29  GLUCOSE 491*  BUN 50*  CREATININE 1.26*  CALCIUM 7.9*   CBC:  Recent Labs  01/06/16 1345  WBC 24.6*  HGB 9.7*  HCT 32.2*  MCV 94.2  PLT 213    Assessment & Plan by Problem:  Bilateral Knee Pain: Patient has no history of gout yet was empirically treated with an augmented steroid regimen. She has continued to worsen. No joint aspiration has been performed. I have a low suspicion for gout at this time given her lack of history, non-resolution, and worsening on steroids. A more likely diagnosis, in my opinion, is septic arthritis given that she is immunosuppressed from steroids, non-ambulatory and has a leukocytosis. It is possible the right knee may have seeded the left knee. Her immunosuppression may explain her lack of fever. Her lower extremity redness is likely due to her lymphedema rather than an acute cellulitis, as this is longstanding, and there is no extension of the lower extremity redness to the knee; therefore, I would be comfortable with a joint aspiration as I think the risk of seeding the joint is low. Her last dose of eliquis was the  morning, so we will wait until tomorrow for a joint aspiration. Other considerations would be pseudogout, autoimmune arthritis, or an intra-articular injury. - Bilateral Knee Aspiration in the morning; Labs: gram stain, joint fluid analysis, culture - Continue home pain regimen - Holding antibiotics for now, but low threshold to initiate vancomycin and ceftriaxone if she shows signs of sepsis (rigors,  tachycardia, hypotension) prior to her knee aspiration.  - Hold Eliquis, hold pharmacologic DVT prophylaxis 12 hours before procedure  Lymphedema: There has been no change in her lower extremity redness or swelling since she was discharged from [facility] 2-3 weeks ago. I do not suspect she has a cellulitis at this time. She has not used her leg compression device since she left [place]. - Holding Lasix  Type 2 DM: Takes Regular Insulin 8U breakfast and lunch, 12-15U at dinner on a sliding scale. - Resistant SSI  PAF (CHADS-VASc 6): Patient not currently in atrial fibrillation.  - Dofetilide 125 mcg BID - Bisoprolol 5 mg daily - Holding Eliquis in preparation for knee aspiration  CAD (s/p LCx, RCA DES): Patient not currently on antiplatelet therapy. Denies chest pain.  HTN: Stable. 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 - Prednisone 10 mg daily  Chronic Back Pain: On chronic pain regimen. Recently switched from methadone. - Morphine 60 mg 12 hr q8h prn  DVT ppx: Lovenox once tonight, hold for the morning knee aspiration.  Diet: NPO at midight  Code Status: DNI. However, patient wants chest compressions and cardioversion.  Dispo: Disposition is deferred at this time, awaiting improvement of current medical problems. Anticipated discharge in approximately 2-3 day(s).   The patient does have a current PCP (Imran Gertie Baron, MD) and does not need an [facility] hospital follow-up appointment after discharge.  The patient does have transportation limitations that hinder transportation to clinic appointments.  Signed: Liberty Handy, MD 01/06/2016, 4:46 PM

## 2016-01-06 NOTE — ED Notes (Signed)
Antibiotics delayed for blood cultures.  Cultures drawn at 15:15

## 2016-01-06 NOTE — Progress Notes (Signed)
Pharmacy Antibiotic Note  Cynthia Hicks is a 82 y.o. female admitted on 01/06/2016 with cellulitis.  Pharmacy has been consulted for vancomycin and zosyn dosing. Pt is afebrile and WBC is elevated at 24.6. SCr is 1.26.   Plan: - vanc 1gm IV x 1 then 500mg  IV Q24H - Zosyn 3.375gm IV Q8H (4 hr inf) - F/u renal fxn, C&S, clinical status and trough at SS  Height: 5\' 1"  (154.9 cm) Weight: 107 lb (48.535 kg) IBW/kg (Calculated) : 47.8  Temp (24hrs), Avg:97.7 F (36.5 C), Min:97.7 F (36.5 C), Max:97.7 F (36.5 C)   Recent Labs Lab 01/06/16 1345  WBC 24.6*  CREATININE 1.26*    Estimated Creatinine Clearance: 26.9 mL/min (by C-G formula based on Cr of 1.26).    Allergies  Allergen Reactions  . Cyclosporine     Blasted bone marrow  . Azathioprine     Other reaction(s): Unknown  . Mycophenolate Other (See Comments)    Pre-op note mentions "intolerance" to mycophenolate.  Unknown reaction, unknown severity.  . Other     Other reaction(s): Other (See Comments) Uncoded Allergy. Allergen: Other Allergy: See Patient Chart for Details, Other Reaction: Toxicity  . Oysters [Shellfish Allergy] Nausea And Vomiting    Antimicrobials this admission: Vanc 6/2>> Zosyn 6/2>>  Dose adjustments this admission: N/A  Microbiology results: Pending  Thank you for allowing pharmacy to be a part of this patient's care.  Kjersti Dittmer, Rande Lawman 01/06/2016 2:41 PM

## 2016-01-06 NOTE — ED Notes (Signed)
Antibiotics stopped per verbal order of MD Hulen Luster

## 2016-01-07 ENCOUNTER — Observation Stay (HOSPITAL_COMMUNITY): Payer: Medicare Other

## 2016-01-07 DIAGNOSIS — E440 Moderate protein-calorie malnutrition: Secondary | ICD-10-CM | POA: Diagnosis present

## 2016-01-07 DIAGNOSIS — Z9889 Other specified postprocedural states: Secondary | ICD-10-CM | POA: Diagnosis not present

## 2016-01-07 DIAGNOSIS — M549 Dorsalgia, unspecified: Secondary | ICD-10-CM

## 2016-01-07 DIAGNOSIS — Z79891 Long term (current) use of opiate analgesic: Secondary | ICD-10-CM

## 2016-01-07 DIAGNOSIS — M62562 Muscle wasting and atrophy, not elsewhere classified, left lower leg: Secondary | ICD-10-CM | POA: Diagnosis present

## 2016-01-07 DIAGNOSIS — M00861 Arthritis due to other bacteria, right knee: Secondary | ICD-10-CM | POA: Diagnosis present

## 2016-01-07 DIAGNOSIS — Z888 Allergy status to other drugs, medicaments and biological substances status: Secondary | ICD-10-CM | POA: Diagnosis not present

## 2016-01-07 DIAGNOSIS — M00862 Arthritis due to other bacteria, left knee: Secondary | ICD-10-CM | POA: Diagnosis present

## 2016-01-07 DIAGNOSIS — G8929 Other chronic pain: Secondary | ICD-10-CM

## 2016-01-07 DIAGNOSIS — E1122 Type 2 diabetes mellitus with diabetic chronic kidney disease: Secondary | ICD-10-CM | POA: Diagnosis present

## 2016-01-07 DIAGNOSIS — E119 Type 2 diabetes mellitus without complications: Secondary | ICD-10-CM

## 2016-01-07 DIAGNOSIS — I480 Paroxysmal atrial fibrillation: Secondary | ICD-10-CM

## 2016-01-07 DIAGNOSIS — N184 Chronic kidney disease, stage 4 (severe): Secondary | ICD-10-CM | POA: Diagnosis present

## 2016-01-07 DIAGNOSIS — Z794 Long term (current) use of insulin: Secondary | ICD-10-CM

## 2016-01-07 DIAGNOSIS — Z7901 Long term (current) use of anticoagulants: Secondary | ICD-10-CM | POA: Diagnosis not present

## 2016-01-07 DIAGNOSIS — I2510 Atherosclerotic heart disease of native coronary artery without angina pectoris: Secondary | ICD-10-CM

## 2016-01-07 DIAGNOSIS — M545 Low back pain: Secondary | ICD-10-CM | POA: Diagnosis present

## 2016-01-07 DIAGNOSIS — L89152 Pressure ulcer of sacral region, stage 2: Secondary | ICD-10-CM | POA: Diagnosis present

## 2016-01-07 DIAGNOSIS — Z91013 Allergy to seafood: Secondary | ICD-10-CM | POA: Diagnosis not present

## 2016-01-07 DIAGNOSIS — M25562 Pain in left knee: Secondary | ICD-10-CM

## 2016-01-07 DIAGNOSIS — Z881 Allergy status to other antibiotic agents status: Secondary | ICD-10-CM | POA: Diagnosis not present

## 2016-01-07 DIAGNOSIS — L03116 Cellulitis of left lower limb: Secondary | ICD-10-CM | POA: Diagnosis present

## 2016-01-07 DIAGNOSIS — I890 Lymphedema, not elsewhere classified: Secondary | ICD-10-CM | POA: Diagnosis present

## 2016-01-07 DIAGNOSIS — B9689 Other specified bacterial agents as the cause of diseases classified elsewhere: Secondary | ICD-10-CM | POA: Diagnosis not present

## 2016-01-07 DIAGNOSIS — I252 Old myocardial infarction: Secondary | ICD-10-CM | POA: Diagnosis not present

## 2016-01-07 DIAGNOSIS — M25561 Pain in right knee: Secondary | ICD-10-CM | POA: Diagnosis present

## 2016-01-07 DIAGNOSIS — Z79899 Other long term (current) drug therapy: Secondary | ICD-10-CM | POA: Diagnosis not present

## 2016-01-07 DIAGNOSIS — L03115 Cellulitis of right lower limb: Secondary | ICD-10-CM | POA: Diagnosis present

## 2016-01-07 DIAGNOSIS — Z955 Presence of coronary angioplasty implant and graft: Secondary | ICD-10-CM

## 2016-01-07 DIAGNOSIS — E1140 Type 2 diabetes mellitus with diabetic neuropathy, unspecified: Secondary | ICD-10-CM | POA: Diagnosis present

## 2016-01-07 DIAGNOSIS — B965 Pseudomonas (aeruginosa) (mallei) (pseudomallei) as the cause of diseases classified elsewhere: Secondary | ICD-10-CM | POA: Diagnosis present

## 2016-01-07 DIAGNOSIS — K219 Gastro-esophageal reflux disease without esophagitis: Secondary | ICD-10-CM | POA: Diagnosis present

## 2016-01-07 DIAGNOSIS — I130 Hypertensive heart and chronic kidney disease with heart failure and stage 1 through stage 4 chronic kidney disease, or unspecified chronic kidney disease: Secondary | ICD-10-CM | POA: Diagnosis present

## 2016-01-07 DIAGNOSIS — Z7952 Long term (current) use of systemic steroids: Secondary | ICD-10-CM | POA: Diagnosis not present

## 2016-01-07 DIAGNOSIS — Z6820 Body mass index (BMI) 20.0-20.9, adult: Secondary | ICD-10-CM | POA: Diagnosis not present

## 2016-01-07 DIAGNOSIS — I5032 Chronic diastolic (congestive) heart failure: Secondary | ICD-10-CM | POA: Diagnosis present

## 2016-01-07 DIAGNOSIS — M62561 Muscle wasting and atrophy, not elsewhere classified, right lower leg: Secondary | ICD-10-CM | POA: Diagnosis present

## 2016-01-07 DIAGNOSIS — I10 Essential (primary) hypertension: Secondary | ICD-10-CM

## 2016-01-07 DIAGNOSIS — E785 Hyperlipidemia, unspecified: Secondary | ICD-10-CM | POA: Diagnosis present

## 2016-01-07 DIAGNOSIS — M25512 Pain in left shoulder: Secondary | ICD-10-CM | POA: Diagnosis present

## 2016-01-07 DIAGNOSIS — K754 Autoimmune hepatitis: Secondary | ICD-10-CM | POA: Diagnosis present

## 2016-01-07 DIAGNOSIS — L89891 Pressure ulcer of other site, stage 1: Secondary | ICD-10-CM | POA: Diagnosis present

## 2016-01-07 DIAGNOSIS — E1151 Type 2 diabetes mellitus with diabetic peripheral angiopathy without gangrene: Secondary | ICD-10-CM | POA: Diagnosis present

## 2016-01-07 DIAGNOSIS — Z7983 Long term (current) use of bisphosphonates: Secondary | ICD-10-CM | POA: Diagnosis not present

## 2016-01-07 DIAGNOSIS — M170 Bilateral primary osteoarthritis of knee: Secondary | ICD-10-CM | POA: Diagnosis present

## 2016-01-07 LAB — SYNOVIAL CELL COUNT + DIFF, W/ CRYSTALS
Crystals, Fluid: NONE SEEN
Crystals, Fluid: NONE SEEN
Eosinophils-Synovial: 0 % (ref 0–1)
Eosinophils-Synovial: 0 % (ref 0–1)
Lymphocytes-Synovial Fld: 0 % (ref 0–20)
Lymphocytes-Synovial Fld: 0 % (ref 0–20)
Monocyte-Macrophage-Synovial Fluid: 14 % — ABNORMAL LOW (ref 50–90)
Monocyte-Macrophage-Synovial Fluid: 18 % — ABNORMAL LOW (ref 50–90)
Neutrophil, Synovial: 82 % — ABNORMAL HIGH (ref 0–25)
Neutrophil, Synovial: 86 % — ABNORMAL HIGH (ref 0–25)
WBC, Synovial: 44000 /mm3 — ABNORMAL HIGH (ref 0–200)
WBC, Synovial: 7800 /mm3 — ABNORMAL HIGH (ref 0–200)

## 2016-01-07 LAB — GRAM STAIN

## 2016-01-07 LAB — GLUCOSE, CAPILLARY
GLUCOSE-CAPILLARY: 270 mg/dL — AB (ref 65–99)
Glucose-Capillary: 162 mg/dL — ABNORMAL HIGH (ref 65–99)
Glucose-Capillary: 189 mg/dL — ABNORMAL HIGH (ref 65–99)
Glucose-Capillary: 185 mg/dL — ABNORMAL HIGH (ref 65–99)

## 2016-01-07 LAB — BASIC METABOLIC PANEL
Anion gap: 10 (ref 5–15)
BUN: 39 mg/dL — AB (ref 6–20)
CHLORIDE: 102 mmol/L (ref 101–111)
CO2: 30 mmol/L (ref 22–32)
CREATININE: 1.14 mg/dL — AB (ref 0.44–1.00)
Calcium: 8.5 mg/dL — ABNORMAL LOW (ref 8.9–10.3)
GFR calc Af Amer: 51 mL/min — ABNORMAL LOW (ref >60)
GFR calc non Af Amer: 44 mL/min — ABNORMAL LOW (ref >60)
GLUCOSE: 157 mg/dL — AB (ref 65–99)
POTASSIUM: 4.6 mmol/L (ref 3.5–5.1)
SODIUM: 142 mmol/L (ref 135–145)

## 2016-01-07 LAB — CBC
HCT: 34.1 % — ABNORMAL LOW (ref 36.0–46.0)
HEMOGLOBIN: 10.2 g/dL — AB (ref 12.0–15.0)
MCH: 27.9 pg (ref 26.0–34.0)
MCHC: 29.9 g/dL — AB (ref 30.0–36.0)
MCV: 93.4 fL (ref 78.0–100.0)
Platelets: 200 10*3/uL (ref 150–400)
RBC: 3.65 MIL/uL — AB (ref 3.87–5.11)
RDW: 21.3 % — ABNORMAL HIGH (ref 11.5–15.5)
WBC: 19.9 10*3/uL — ABNORMAL HIGH (ref 4.0–10.5)

## 2016-01-07 LAB — URINE CULTURE

## 2016-01-07 LAB — MAGNESIUM: Magnesium: 2.2 mg/dL (ref 1.7–2.4)

## 2016-01-07 MED ORDER — PREDNISONE 10 MG PO TABS
10.0000 mg | ORAL_TABLET | Freq: Every day | ORAL | Status: DC
  Administered 2016-01-08 – 2016-01-10 (×3): 10 mg via ORAL
  Filled 2016-01-07 (×3): qty 1

## 2016-01-07 MED ORDER — OXYCODONE HCL 5 MG PO TABS
5.0000 mg | ORAL_TABLET | Freq: Once | ORAL | Status: AC
  Administered 2016-01-07: 5 mg via ORAL

## 2016-01-07 MED ORDER — OXYCODONE HCL 5 MG PO TABS
5.0000 mg | ORAL_TABLET | Freq: Two times a day (BID) | ORAL | Status: DC | PRN
  Administered 2016-01-07 – 2016-01-10 (×5): 5 mg via ORAL
  Filled 2016-01-07 (×7): qty 1

## 2016-01-07 MED ORDER — APIXABAN 2.5 MG PO TABS: 2.5000 mg | ORAL_TABLET | Freq: Two times a day (BID) | ORAL | Status: DC

## 2016-01-07 MED ORDER — BISOPROLOL FUMARATE 5 MG PO TABS
5.0000 mg | ORAL_TABLET | Freq: Every day | ORAL | Status: DC
  Administered 2016-01-07 – 2016-01-10 (×4): 5 mg via ORAL
  Filled 2016-01-07 (×4): qty 1

## 2016-01-07 MED ORDER — IOHEXOL 180 MG/ML  SOLN: 10.0000 mL | Freq: Once | INTRAMUSCULAR | Status: DC | PRN

## 2016-01-07 MED ORDER — LOSARTAN POTASSIUM 50 MG PO TABS
25.0000 mg | ORAL_TABLET | Freq: Every day | ORAL | Status: DC
  Administered 2016-01-07 – 2016-01-10 (×4): 25 mg via ORAL
  Filled 2016-01-07 (×4): qty 1

## 2016-01-07 MED ORDER — LIDOCAINE HCL (PF) 1 % IJ SOLN
INTRAMUSCULAR | Status: AC
  Filled 2016-01-07: qty 5

## 2016-01-07 MED ORDER — APIXABAN 2.5 MG PO TABS
2.5000 mg | ORAL_TABLET | Freq: Two times a day (BID) | ORAL | Status: DC
Start: 2016-01-08 — End: 2016-01-08
  Administered 2016-01-08: 2.5 mg via ORAL
  Filled 2016-01-07: qty 1

## 2016-01-07 MED ORDER — ACETAMINOPHEN 325 MG PO TABS
650.0000 mg | ORAL_TABLET | Freq: Four times a day (QID) | ORAL | Status: DC | PRN
  Administered 2016-01-07: 650 mg via ORAL
  Filled 2016-01-07: qty 2

## 2016-01-07 MED ORDER — GLUCERNA SHAKE PO LIQD: 237.0000 mL | Freq: Two times a day (BID) | ORAL | Status: DC

## 2016-01-07 MED ORDER — DICLOFENAC SODIUM 1 % TD GEL
2.0000 g | Freq: Four times a day (QID) | TRANSDERMAL | Status: DC
  Administered 2016-01-07 – 2016-01-10 (×10): 2 g via TOPICAL
  Filled 2016-01-07: qty 100

## 2016-01-07 MED ORDER — GABAPENTIN 100 MG PO CAPS
100.0000 mg | ORAL_CAPSULE | Freq: Three times a day (TID) | ORAL | Status: DC
  Administered 2016-01-07 – 2016-01-10 (×9): 100 mg via ORAL
  Filled 2016-01-07 (×9): qty 1

## 2016-01-07 NOTE — Progress Notes (Signed)
Initial Nutrition Assessment  DOCUMENTATION CODES:   Non-severe (moderate) malnutrition in context of chronic illness  INTERVENTION:   -Glucerna Shake po BID, each supplement provides 220 kcal and 10 grams of protein  NUTRITION DIAGNOSIS:   Malnutrition related to chronic illness as evidenced by mild depletion of body fat, mild depletion of muscle mass.  GOAL:   Patient will meet greater than or equal to 90% of their needs  MONITOR:   PO intake, Supplement acceptance, Labs, Weight trends, Skin, I & O's  REASON FOR ASSESSMENT:   Malnutrition Screening Tool    ASSESSMENT:   Cynthia Hicks is an 82 year old woman with a PMH of autoimmune hepatitis on chronic steroids, lymphedema of right leg, recurrent cellulitis, MSSA sepsis, CKD Stage 4, PAF (on Eliquis, dofetilide), PAD, CAD (s/p RCA, LCx DES) who comes to the hospital complaining of bilateral knee pain.  Pt admitted with bilateral knee pain.   Hx obtained from pt at bedside. She reports a general decline in health over the past years, however, more so over the past 1-2 months, due to recent hospitalization and weakness. Pt reports her appetite has been poor and has been consuming mostly easy to prepare foods, due to inability to cook meals. Typical meal pattern is as follows- breakfast: cereal, lunch: half sandwich, dinner: cereal or soup.   Pt reports weight loss, however, unable to provide quantity or time frame. Wt hx reviewed; noted UBW of around 130#. Pt has experienced a 20.7% wt loss over the past 7 months, which is significant for time frame.   Pt scheduled for knee aspiration today.   Nutrition-Focused physical exam completed. Findings are mild to moderate fat depletion, mild to moderate muscle depletion, and no edema.   Pt acknowledges importance of good nutrition to assist with healing. Pt reports she does not like Ensure or Boost supplements, but has consumed Glucerna in the past. She is agreeable to consume to  improve her nutritional status, however, reports that she does not particularly care for the supplement.   Labs reviewed.   Diet Order:  Diet heart healthy/carb modified Room service appropriate?: Yes; Fluid consistency:: Thin  Skin:  Wound (see comment) (cellulitis bilateral legs)  Last BM:  01/06/16  Height:   Ht Readings from Last 1 Encounters:  01/06/16 5\' 1"  (1.549 m)    Weight:   Wt Readings from Last 1 Encounters:  01/06/16 107 lb (48.535 kg)    Ideal Body Weight:  47.7 kg  BMI:  Body mass index is 20.23 kg/(m^2).  Estimated Nutritional Needs:   Kcal:  1400-1600  Protein:  65-80 grams  Fluid:  1.4-1.6 L  EDUCATION NEEDS:   Education needs addressed  Natasha Paulson A. Jimmye Norman, RD, LDN, CDE Pager: [phone] After hours Pager: [phone]

## 2016-01-07 NOTE — Progress Notes (Signed)
MD paged to make aware of patient's 9/10 pain unrelieved by PO morphine. Patient tearful.

## 2016-01-07 NOTE — Consult Note (Signed)
WOC wound consult note Reason for Consult: Recently ruptured (prior to this admission) serum filled blister, patient reports that she "peeled skin off" of site.  Additionally, a small area of partial thickness tissue loss at coccyx that has been present for three years is noted. A dried area with eschar is on the lateral left foot at the 5th digit. Wound type:Trauma, pressure and neuropathic Pressure Ulcer POA: Yes Measurement: left foot, 5th digit:  1.2cm round area of dry, stable eschar. No drainage.  No dressing order required. Left lateral heel:  0.6cm round x 0.1cm partial thickness, red, moist wound bed, no drainage.  Coccyx: 1cm x 1cm x 0.1cm area of chronic tissue loss, scant serous exudate. Wound bed:As noted above. Drainage (amount, consistency, odor) As noted above Periwound: Intact, dry. Bilateral erythema and edema. Dressing procedure/placement/frequency: Nursing is provided with guidance for placement of a prophylactic/treatment foam dressing to the coccyx, also a foam dressing for the left lateral heel.  Bilateral heels will benefit from flotation via Prevalon boots and those are ordered for her today.  The dried, stable eschar on the left lateral foot will require no dressing. [facility] nursing team will not follow, but will remain available to this patient, the nursing and medical teams.  Please re-consult if needed. Thanks, Maudie Flakes, MSN, RN, [place], Arther Abbott  Pager# [phone]

## 2016-01-07 NOTE — Procedures (Signed)
Bilateral knee joint aspirations performed. Only trace (1 mL or less) of serosanguinous fluid obtained from each knee with initial aspiration. 10 mL sterile saline then injected into each knee, reaspirated, and sent separately from initial aspirates.

## 2016-01-07 NOTE — Progress Notes (Signed)
Patient asking for pain medication and PRN order is only BID per PTA med list and orders from MD in epic.  Patient states her Pain PCP ordered it Q6hr PRN at home and asked RN to call MD here. MD placed orders.

## 2016-01-07 NOTE — Progress Notes (Signed)
Patient NPO, c/o pain- had morphine PO scheduled for 0600 am. Per doctor Rivet ok to give PO medication at this time.

## 2016-01-07 NOTE — Progress Notes (Signed)
Subjective:  Ms. Pritt says that he knees continue to hurt this morning, but the redness over the knee has improved. She continues to endorse longstanding lower back pain, left shoulder pain. She denies any fever or chills this morning.  Objective: Vital signs in last 24 hours: Filed Vitals:   01/06/16 2129 01/06/16 2130 01/07/16 0516 01/07/16 0733  BP:  132/54 143/56 147/49  Pulse:  72 72 69  Temp: 97.9 F (36.6 C)  98.2 F (36.8 C)   TempSrc: Oral  Oral   Resp:  18 18   Height:      Weight:      SpO2:  100% 97% 100%   Weight change:   Intake/Output Summary (Last 24 hours) at 01/07/16 1012 Last data filed at 01/06/16 2138  Gross per 24 hour  Intake    740 ml  Output    550 ml  Net    190 ml   Physical Exam. General: Lying in bed, not diaphoretic HEENT: Dry MM, Neck Supple Cardiovascular: RRR, no m/r/g Pulmonary: CTAB. Unlabored breathing Abdominal: Soft NT/ND. Normal BS. Extremities and Skin: Erythematous lower extremities with 1+ pitting edema. Erythema overlying the knees has improved and less demarcated today. TTP of knees with limited ROM.  Lab Results: Basic Metabolic Panel:  Recent Labs Lab 01/06/16 1345 01/07/16 0537  NA 137 142  K 4.8 4.6  CL 99* 102  CO2 29 30  GLUCOSE 491* 157*  BUN 50* 39*  CREATININE 1.26* 1.14*  CALCIUM 7.9* 8.5*   CBC:  Recent Labs Lab 01/06/16 1345 01/07/16 0537  WBC 24.6* 19.9*  HGB 9.7* 10.2*  HCT 32.2* 34.1*  MCV 94.2 93.4  PLT 213 200   CBG:  Recent Labs Lab 01/06/16 1659 01/06/16 2205 01/07/16 0804  GLUCAP 460* 192* 162*    Micro Results: Recent Results (from the past 240 hour(s))  Blood Culture (routine x 2)     Status: None (Preliminary result)   Collection Time: 01/06/16  3:00 PM  Result Value Ref Range Status   Specimen Description BLOOD RIGHT ANTECUBITAL  Final   Special Requests   Final    BOTTLES DRAWN AEROBIC AND ANAEROBIC BLUE 10CC RED 5CC   Culture NO GROWTH < 24 HOURS  Final   Report Status PENDING  Incomplete  Blood Culture (routine x 2)     Status: None (Preliminary result)   Collection Time: 01/06/16  3:10 PM  Result Value Ref Range Status   Specimen Description BLOOD LEFT ANTECUBITAL  Final   Special Requests BOTTLES DRAWN AEROBIC ONLY 10CC  Final   Culture NO GROWTH < 24 HOURS  Final   Report Status PENDING  Incomplete   Studies/Results: No results found. Medications: I have reviewed the patient's current medications. Scheduled Meds: . bisoprolol  5 mg Oral Daily  . cholecalciferol  2,000 Units Oral QHS  . diclofenac sodium  2 g Topical QID  . dofetilide  125 mcg Oral BID  . enoxaparin (LOVENOX) injection  30 mg Subcutaneous Q24H  . gabapentin  100 mg Oral TID  . insulin aspart  0-20 Units Subcutaneous TID WC  . losartan  25 mg Oral Daily  . morphine  60 mg Oral Q8H  . piperacillin-tazobactam (ZOSYN)  IV  3.375 g Intravenous Q8H  . predniSONE  10 mg Oral Q breakfast  . vancomycin  500 mg Intravenous Q24H   Continuous Infusions:  PRN Meds:.acetaminophen Assessment/Plan:  Bilateral Knee Pain: Differential at this time includes septic arthritis, autoimmune  etiologies (i.e. Sarcoidosis or previous bacterial infection leading to erythema nodosum). Gout or pseudogout seem less likely at this time. Zosyn was inadvertently started and she has seen an improvement in the erythema. We will add vancomycin at this time since antibiotics have been started to complete empiric therapy. We will also proceed with knee X-rays and aspiration. We appreciate radiology for their assistance. - Vancomycin and Zosyn IV - Knee aspiration today - Knee X-rays pending - Blood cultures NTD  Lymphedema: There has been no change in her lower extremity redness or swelling since she was discharged from [facility] 2-3 weeks ago. I do not suspect she has a cellulitis at this time. She has not used her leg compression device since she left [place]. - Holding Lasix  Type 2  DM: BGs in 100s todayTakes Regular Insulin 8U breakfast and lunch, 12-15U at dinner on a sliding scale. - Resistant SSI  PAF (CHADS-VASc 6): Patient not currently in atrial fibrillation.  - Dofetilide 125 mcg BID - Bisoprolol 5 mg daily - Holding Eliquis in preparation for knee aspiration  HTN: Stable. 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 - Prednisone 10 mg daily  Chronic Back Pain: On chronic pain regimen. Recently switched from methadone. - Morphine 60 mg 12 hr q8h prn - Oxycodone 5 mg BID prn  DVT ppx: Will restart Eliquis after knee aspiration  Dispo: Disposition is deferred at this time, awaiting improvement of current medical problems.  Anticipated discharge in approximately 2-3 day(s).   The patient does have a current PCP (Imran Gertie Baron, MD) and does not need an [facility] hospital follow-up appointment after discharge.  The patient does have transportation limitations that hinder transportation to clinic appointments.  .Services Needed at time of discharge: Y = Yes, Blank = No PT:   OT:   RN:   Equipment:   Other:       [place] Handy, MD 01/07/2016, 10:12 AM

## 2016-01-08 ENCOUNTER — Inpatient Hospital Stay (HOSPITAL_COMMUNITY): Payer: Medicare Other | Admitting: Anesthesiology

## 2016-01-08 ENCOUNTER — Encounter (HOSPITAL_COMMUNITY)
Admission: EM | Disposition: A | Discharge status: Skilled Nursing Facility | Payer: Self-pay | Source: Home / Self Care | Attending: Oncology

## 2016-01-08 DIAGNOSIS — E440 Moderate protein-calorie malnutrition: Secondary | ICD-10-CM | POA: Insufficient documentation

## 2016-01-08 DIAGNOSIS — L8990 Pressure ulcer of unspecified site, unspecified stage: Secondary | ICD-10-CM | POA: Insufficient documentation

## 2016-01-08 HISTORY — PX: KNEE ARTHROSCOPY: SHX127

## 2016-01-08 LAB — CBC
HEMATOCRIT: 31.6 % — AB (ref 36.0–46.0)
HEMOGLOBIN: 9.1 g/dL — AB (ref 12.0–15.0)
MCH: 27.2 pg (ref 26.0–34.0)
MCHC: 28.8 g/dL — AB (ref 30.0–36.0)
MCV: 94.3 fL (ref 78.0–100.0)
Platelets: 184 10*3/uL (ref 150–400)
RBC: 3.35 MIL/uL — ABNORMAL LOW (ref 3.87–5.11)
RDW: 21.2 % — ABNORMAL HIGH (ref 11.5–15.5)
WBC: 16.6 10*3/uL — ABNORMAL HIGH (ref 4.0–10.5)

## 2016-01-08 LAB — GLUCOSE, CAPILLARY
GLUCOSE-CAPILLARY: 180 mg/dL — AB (ref 65–99)
GLUCOSE-CAPILLARY: 155 mg/dL — AB (ref 65–99)
Glucose-Capillary: 155 mg/dL — ABNORMAL HIGH (ref 65–99)
Glucose-Capillary: 312 mg/dL — ABNORMAL HIGH (ref 65–99)
Glucose-Capillary: 97 mg/dL (ref 65–99)
Glucose-Capillary: 106 mg/dL — ABNORMAL HIGH (ref 65–99)

## 2016-01-08 LAB — COMPREHENSIVE METABOLIC PANEL
ALBUMIN: 1.8 g/dL — AB (ref 3.5–5.0)
ALK PHOS: 86 U/L (ref 38–126)
ALT: 29 U/L (ref 14–54)
ANION GAP: 8 (ref 5–15)
AST: 27 U/L (ref 15–41)
BILIRUBIN TOTAL: 1 mg/dL (ref 0.3–1.2)
BUN: 32 mg/dL — ABNORMAL HIGH (ref 6–20)
CALCIUM: 8.6 mg/dL — AB (ref 8.9–10.3)
CO2: 28 mmol/L (ref 22–32)
CREATININE: 1.18 mg/dL — AB (ref 0.44–1.00)
Chloride: 108 mmol/L (ref 101–111)
GFR calc Af Amer: 49 mL/min — ABNORMAL LOW (ref >60)
GFR calc non Af Amer: 42 mL/min — ABNORMAL LOW (ref >60)
GLUCOSE: 123 mg/dL — AB (ref 65–99)
Potassium: 4.7 mmol/L (ref 3.5–5.1)
Sodium: 144 mmol/L (ref 135–145)
TOTAL PROTEIN: 5.2 g/dL — AB (ref 6.5–8.1)

## 2016-01-08 SURGERY — ARTHROSCOPY, KNEE
Anesthesia: General | Site: Knee | Laterality: Bilateral

## 2016-01-08 MED ORDER — MORPHINE SULFATE ER 30 MG PO TBCR
60.0000 mg | EXTENDED_RELEASE_TABLET | Freq: Three times a day (TID) | ORAL | Status: DC
  Administered 2016-01-08 – 2016-01-10 (×6): 60 mg via ORAL
  Filled 2016-01-08 (×6): qty 2

## 2016-01-08 MED ORDER — DEXTROSE 5 % IV SOLN
2.0000 g | INTRAVENOUS | Status: DC
  Administered 2016-01-08 – 2016-01-09 (×2): 2 g via INTRAVENOUS
  Filled 2016-01-08 (×4): qty 2

## 2016-01-08 MED ORDER — ONDANSETRON HCL 4 MG/2ML IJ SOLN: 4.0000 mg | Freq: Four times a day (QID) | INTRAMUSCULAR | Status: DC | PRN

## 2016-01-08 MED ORDER — FUROSEMIDE 40 MG PO TABS
40.0000 mg | ORAL_TABLET | Freq: Every morning | ORAL | Status: DC
  Administered 2016-01-09 – 2016-01-10 (×2): 40 mg via ORAL
  Filled 2016-01-08 (×2): qty 1

## 2016-01-08 MED ORDER — HYDROMORPHONE HCL 1 MG/ML IJ SOLN
0.2500 mg | INTRAMUSCULAR | Status: DC | PRN
  Administered 2016-01-08 (×4): 0.5 mg via INTRAVENOUS

## 2016-01-08 MED ORDER — SODIUM CHLORIDE 0.9 % IV SOLN
INTRAVENOUS | Status: DC | PRN
  Administered 2016-01-08: 14:00:00 via INTRAVENOUS

## 2016-01-08 MED ORDER — PHENYLEPHRINE HCL 10 MG/ML IJ SOLN
10.0000 mg | INTRAVENOUS | Status: DC | PRN
  Administered 2016-01-08: 60 ug/min via INTRAVENOUS

## 2016-01-08 MED ORDER — EPHEDRINE 5 MG/ML INJ
INTRAVENOUS | Status: AC
  Filled 2016-01-08: qty 10

## 2016-01-08 MED ORDER — FUROSEMIDE 20 MG PO TABS
20.0000 mg | ORAL_TABLET | Freq: Every day | ORAL | Status: DC
Start: 2016-01-08 — End: 2016-01-10
  Administered 2016-01-09 – 2016-01-10 (×2): 20 mg via ORAL
  Filled 2016-01-08 (×3): qty 1

## 2016-01-08 MED ORDER — EPHEDRINE SULFATE 50 MG/ML IJ SOLN
INTRAMUSCULAR | Status: DC | PRN
  Administered 2016-01-08: 20 mg via INTRAVENOUS

## 2016-01-08 MED ORDER — SUCCINYLCHOLINE CHLORIDE 20 MG/ML IJ SOLN
INTRAMUSCULAR | Status: DC | PRN
  Administered 2016-01-08: 80 mg via INTRAVENOUS

## 2016-01-08 MED ORDER — METHOCARBAMOL 500 MG PO TABS
500.0000 mg | ORAL_TABLET | Freq: Four times a day (QID) | ORAL | Status: DC | PRN
  Administered 2016-01-08 – 2016-01-09 (×2): 500 mg via ORAL
  Filled 2016-01-08 (×3): qty 1

## 2016-01-08 MED ORDER — OXYCODONE HCL 5 MG PO TABS: 5.0000 mg | ORAL_TABLET | Freq: Once | ORAL | Status: DC | PRN

## 2016-01-08 MED ORDER — SODIUM CHLORIDE 0.9 % IR SOLN
Status: DC | PRN
  Administered 2016-01-08: 6000 mL

## 2016-01-08 MED ORDER — ONDANSETRON HCL 4 MG PO TABS: 4.0000 mg | ORAL_TABLET | Freq: Four times a day (QID) | ORAL | Status: DC | PRN

## 2016-01-08 MED ORDER — FENTANYL CITRATE (PF) 250 MCG/5ML IJ SOLN
INTRAMUSCULAR | Status: AC
Start: 2016-01-08 — End: 2016-01-08
  Filled 2016-01-08: qty 5

## 2016-01-08 MED ORDER — 0.9 % SODIUM CHLORIDE (POUR BTL) OPTIME
TOPICAL | Status: DC | PRN
  Administered 2016-01-08: 1000 mL

## 2016-01-08 MED ORDER — ACETAMINOPHEN 325 MG PO TABS
650.0000 mg | ORAL_TABLET | Freq: Four times a day (QID) | ORAL | Status: DC | PRN
Start: 2016-01-08 — End: 2016-01-10

## 2016-01-08 MED ORDER — POVIDONE-IODINE 10 % EX SWAB
2.0000 | Freq: Once | CUTANEOUS | Status: DC
Start: 2016-01-08 — End: 2016-01-08

## 2016-01-08 MED ORDER — HYDROMORPHONE HCL 1 MG/ML IJ SOLN
INTRAMUSCULAR | Status: AC
  Filled 2016-01-08: qty 1

## 2016-01-08 MED ORDER — METOCLOPRAMIDE HCL 5 MG/ML IJ SOLN: 5.0000 mg | Freq: Three times a day (TID) | INTRAMUSCULAR | Status: DC | PRN

## 2016-01-08 MED ORDER — ONDANSETRON HCL 4 MG/2ML IJ SOLN
INTRAMUSCULAR | Status: DC | PRN
  Administered 2016-01-08: 4 mg via INTRAVENOUS

## 2016-01-08 MED ORDER — LIDOCAINE 2% (20 MG/ML) 5 ML SYRINGE
INTRAMUSCULAR | Status: AC
  Filled 2016-01-08: qty 5

## 2016-01-08 MED ORDER — LIDOCAINE HCL (CARDIAC) 20 MG/ML IV SOLN
INTRAVENOUS | Status: DC | PRN
  Administered 2016-01-08: 60 mg via INTRATRACHEAL

## 2016-01-08 MED ORDER — SODIUM CHLORIDE 0.9 % IV SOLN
INTRAVENOUS | Status: DC
  Administered 2016-01-09: 23:00:00 via INTRAVENOUS

## 2016-01-08 MED ORDER — DEXTROSE 5 % IV SOLN
500.0000 mg | Freq: Four times a day (QID) | INTRAVENOUS | Status: DC | PRN
  Filled 2016-01-08: qty 5

## 2016-01-08 MED ORDER — OXYCODONE HCL 5 MG/5ML PO SOLN: 5.0000 mg | Freq: Once | ORAL | Status: DC | PRN

## 2016-01-08 MED ORDER — PROPOFOL 10 MG/ML IV BOLUS
INTRAVENOUS | Status: AC
  Filled 2016-01-08: qty 20

## 2016-01-08 MED ORDER — CEFAZOLIN SODIUM-DEXTROSE 2-4 GM/100ML-% IV SOLN
INTRAVENOUS | Status: AC
  Filled 2016-01-08: qty 100

## 2016-01-08 MED ORDER — FENTANYL CITRATE (PF) 250 MCG/5ML IJ SOLN
INTRAMUSCULAR | Status: DC | PRN
  Administered 2016-01-08 (×2): 50 ug via INTRAVENOUS

## 2016-01-08 MED ORDER — ACETAMINOPHEN 650 MG RE SUPP: 650.0000 mg | Freq: Four times a day (QID) | RECTAL | Status: DC | PRN

## 2016-01-08 MED ORDER — METOCLOPRAMIDE HCL 10 MG PO TABS: 5.0000 mg | ORAL_TABLET | Freq: Three times a day (TID) | ORAL | Status: DC | PRN

## 2016-01-08 MED ORDER — FUROSEMIDE 20 MG PO TABS: 20.0000 mg | ORAL_TABLET | Freq: Two times a day (BID) | ORAL | Status: DC

## 2016-01-08 MED ORDER — PROPOFOL 10 MG/ML IV BOLUS
INTRAVENOUS | Status: DC | PRN
  Administered 2016-01-08: 100 mg via INTRAVENOUS

## 2016-01-08 MED ORDER — ONDANSETRON HCL 4 MG/2ML IJ SOLN
INTRAMUSCULAR | Status: AC
  Filled 2016-01-08: qty 2

## 2016-01-08 MED ORDER — SUCCINYLCHOLINE CHLORIDE 200 MG/10ML IV SOSY
PREFILLED_SYRINGE | INTRAVENOUS | Status: AC
  Filled 2016-01-08: qty 10

## 2016-01-08 SURGICAL SUPPLY — 37 items
BLADE CUDA 5.5 (BLADE) IMPLANT
BLADE GREAT WHITE 4.2 (BLADE) IMPLANT
BLADE GREAT WHITE 4.2MM (BLADE)
BNDG COHESIVE 6X5 TAN STRL LF (GAUZE/BANDAGES/DRESSINGS) ×6 IMPLANT
BNDG GAUZE ELAST 4 BULKY (GAUZE/BANDAGES/DRESSINGS) ×6 IMPLANT
BUR OVAL 6.0 (BURR) IMPLANT
CANNULA SHOULDER 7CM (CANNULA) ×6 IMPLANT
COVER SURGICAL LIGHT HANDLE (MISCELLANEOUS) ×3 IMPLANT
CUFF TOURNIQUET SINGLE 34IN LL (TOURNIQUET CUFF) IMPLANT
CUFF TOURNIQUET SINGLE 44IN (TOURNIQUET CUFF) IMPLANT
DRAPE ARTHROSCOPY W/POUCH 114 (DRAPES) IMPLANT
DRAPE U-SHAPE 47X51 STRL (DRAPES) ×6 IMPLANT
DRSG EMULSION OIL 3X3 NADH (GAUZE/BANDAGES/DRESSINGS) IMPLANT
DRSG PAD ABDOMINAL 8X10 ST (GAUZE/BANDAGES/DRESSINGS) ×6 IMPLANT
DURAPREP 26ML APPLICATOR (WOUND CARE) ×6 IMPLANT
GAUZE SPONGE 4X4 12PLY STRL (GAUZE/BANDAGES/DRESSINGS) ×6 IMPLANT
GLOVE SURG ORTHO 9.0 STRL STRW (GLOVE) ×3 IMPLANT
GOWN STRL REUS W/ TWL XL LVL3 (GOWN DISPOSABLE) ×3 IMPLANT
GOWN STRL REUS W/TWL XL LVL3 (GOWN DISPOSABLE) ×6
KIT BASIN OR (CUSTOM PROCEDURE TRAY) ×3 IMPLANT
KIT ROOM TURNOVER OR (KITS) ×3 IMPLANT
MANIFOLD NEPTUNE II (INSTRUMENTS) ×3 IMPLANT
NEEDLE 18GX1X1/2 (RX/OR ONLY) (NEEDLE) IMPLANT
PACK ARTHROSCOPY DSU (CUSTOM PROCEDURE TRAY) ×3 IMPLANT
PAD ARMBOARD 7.5X6 YLW CONV (MISCELLANEOUS) ×6 IMPLANT
PADDING CAST COTTON 6X4 STRL (CAST SUPPLIES) IMPLANT
SET ARTHROSCOPY TUBING (MISCELLANEOUS) ×2
SET ARTHROSCOPY TUBING LN (MISCELLANEOUS) ×1 IMPLANT
SET CYSTO W/LG BORE CLAMP LF (SET/KITS/TRAYS/PACK) ×6 IMPLANT
SUCTION HEMOVAC SNY HYST (SUCTIONS) ×6 IMPLANT
SUT ETHILON 4 0 PS 2 18 (SUTURE) ×6 IMPLANT
SYR 20CC LL (SYRINGE) IMPLANT
TOWEL OR 17X24 6PK STRL BLUE (TOWEL DISPOSABLE) ×6 IMPLANT
TUBE CONNECTING 12'X1/4 (SUCTIONS) ×2
TUBE CONNECTING 12X1/4 (SUCTIONS) ×4 IMPLANT
WAND HAND CNTRL MULTIVAC 90 (MISCELLANEOUS) IMPLANT
WATER STERILE IRR 1000ML POUR (IV SOLUTION) ×3 IMPLANT

## 2016-01-08 NOTE — Transfer of Care (Signed)
Immediate Anesthesia Transfer of Care Note  Patient: Cynthia Hicks  Procedure(s) Performed: Procedure(s): ARTHROSCOPY KNEE (Bilateral)  Patient Location: PACU  Anesthesia Type:General  Level of Consciousness: awake  Airway & Oxygen Therapy: Patient Spontanous Breathing and Patient connected to nasal cannula oxygen  Post-op Assessment: Report given to RN and Post -op Vital signs reviewed and stable  Post vital signs: Reviewed and stable  Last Vitals:  Filed Vitals:   01/07/16 2127 01/08/16 0436  BP: 157/70 150/63  Pulse: 71 88  Temp: 36.6 C 36.6 C  Resp: 16 16    Last Pain:  Filed Vitals:   01/08/16 0614  PainSc: Asleep      Patients Stated Pain Goal: 2 ([id number])  Complications: No apparent anesthesia complications

## 2016-01-08 NOTE — Anesthesia Procedure Notes (Signed)
Procedure Name: Intubation Date/Time: 01/08/2016 2:45 PM Performed by: Marinda Elk A Pre-anesthesia Checklist: Patient identified, Emergency Drugs available, Suction available, Patient being monitored and Timeout performed Patient Re-evaluated:Patient Re-evaluated prior to inductionOxygen Delivery Method: Circle System Utilized and Circle system utilized Preoxygenation: Pre-oxygenation with 100% oxygen Intubation Type: IV induction, Rapid sequence and Cricoid Pressure applied Ventilation: Mask ventilation without difficulty Laryngoscope Size: Mac and 3 Grade View: Grade II Tube type: Oral Tube size: 7.5 mm Number of attempts: 1 Airway Equipment and Method: Stylet Placement Confirmation: ETT inserted through vocal cords under direct vision,  positive ETCO2 and breath sounds checked- equal and bilateral Secured at: 21 cm Tube secured with: Tape Dental Injury: Teeth and Oropharynx as per pre-operative assessment

## 2016-01-08 NOTE — Anesthesia Postprocedure Evaluation (Signed)
Anesthesia Post Note  Patient: Cynthia Hicks  Procedure(s) Performed: Procedure(s) (LRB): ARTHROSCOPY KNEE (Bilateral)  Patient location during evaluation: PACU Anesthesia Type: General Level of consciousness: awake and alert and patient cooperative Pain management: pain level controlled Vital Signs Assessment: post-procedure vital signs reviewed and stable Respiratory status: spontaneous breathing and respiratory function stable Cardiovascular status: stable Anesthetic complications: no    Last Vitals:  Filed Vitals:   01/08/16 1537 01/08/16 1552  BP: 169/76 161/77  Pulse: 107 73  Temp:    Resp: 14 19    Last Pain:  Filed Vitals:   01/08/16 1601  PainSc: [place]

## 2016-01-08 NOTE — Progress Notes (Signed)
Dr. Marcie Bal made aware that pt. Ate a full meal at 1100. Pt. Given Eliquis today , noted on the [facility], Dr. Sharol Given aware.

## 2016-01-08 NOTE — Anesthesia Preprocedure Evaluation (Addendum)
Anesthesia Evaluation  Patient identified by MRN, date of birth, ID band Patient awake    Reviewed: Allergy & Precautions, NPO status , Patient's Chart, lab work & pertinent test results  Airway Mallampati: II  TM Distance: >3 FB Neck ROM: full    Dental  (+) Teeth Intact, Dental Advisory Given   Pulmonary    breath sounds clear to auscultation       Cardiovascular hypertension, + CAD, + Past MI, + Cardiac Stents and + Peripheral Vascular Disease   Rhythm:regular Rate:Normal     Neuro/Psych  Neuromuscular disease    GI/Hepatic GERD  ,(+) Hepatitis -, Autoimmune  Endo/Other  diabetes, Type 2  Renal/GU Renal InsufficiencyRenal disease     Musculoskeletal  (+) Arthritis ,   Abdominal   Peds  Hematology  (+) Blood dyscrasia, anemia ,   Anesthesia Other Findings   Reproductive/Obstetrics                            Anesthesia Physical Anesthesia Plan  ASA: III  Anesthesia Plan: General   Post-op Pain Management:    Induction: Intravenous  Airway Management Planned: Oral ETT  Additional Equipment:   Intra-op Plan:   Post-operative Plan: Extubation in OR  Informed Consent: I have reviewed the patients History and Physical, chart, labs and discussed the procedure including the risks, benefits and alternatives for the proposed anesthesia with the patient or authorized representative who has indicated his/her understanding and acceptance.     Plan Discussed with: CRNA, Anesthesiologist and Surgeon  Anesthesia Plan Comments:         Anesthesia Quick Evaluation

## 2016-01-08 NOTE — Progress Notes (Signed)
Subjective:  Pt states her knee pain feels the same as yesterday. She is interested in working with PT.   Objective: Vital signs in last 24 hours: Filed Vitals:   01/07/16 0516 01/07/16 0733 01/07/16 2127 01/08/16 0436  BP: 143/56 147/49 157/70 150/63  Pulse: 72 69 71 88  Temp: 98.2 F (36.8 C)  97.9 F (36.6 C) 97.8 F (36.6 C)  TempSrc: Oral   Oral  Resp: _0 Height:      Weight:      SpO2: 97% 100% 98% 96%   Weight change:   Intake/Output Summary (Last 24 hours) at 01/08/16 1010 Last data filed at 01/08/16 0444  Gross per 24 hour  Intake    240 ml  Output    750 ml  Net   -510 ml   Physical Exam. General: Lying in bed, not diaphoretic, NAD HEENT: Dry MM, Neck Supple Cardiovascular: RRR, no m/r/g Pulmonary: CTAB. Unlabored breathing Abdominal: Soft NT/ND. Normal BS. Extremities and Skin: Erythematous lower extremities with 1+ pitting edema. Erythema overlying the knees has improved and less demarcated today, warm. TTP of knees with limited ROM.  Lab Results: Basic Metabolic Panel:  Recent Labs Lab 01/07/16 0537 01/07/16 1543 01/08/16 0515  NA 142  --  144  K 4.6  --  4.7  CL 102  --  108  CO2 30  --  28  GLUCOSE 157*  --  123*  BUN 39*  --  32*  CREATININE 1.14*  --  1.18*  CALCIUM 8.5*  --  8.6*  MG  --  2.2  --    CBC:  Recent Labs Lab 01/07/16 0537 01/08/16 0515  WBC 19.9* 16.6*  HGB 10.2* 9.1*  HCT 34.1* 31.6*  MCV 93.4 94.3  PLT 200 184   CBG:  Recent Labs Lab 01/06/16 2205 01/07/16 0804 01/07/16 1258 01/07/16 1708 01/07/16 2235 01/08/16 0820  GLUCAP 192* 162* 189* 270* 185* 155*    Micro Results: Recent Results (from the past 240 hour(s))  Blood Culture (routine x 2)     Status: None (Preliminary result)   Collection Time: 01/06/16  3:00 PM  Result Value Ref Range Status   Specimen Description BLOOD RIGHT ANTECUBITAL  Final   Special Requests   Final    BOTTLES DRAWN AEROBIC AND ANAEROBIC BLUE 10CC RED 5CC   Culture NO GROWTH < 24 HOURS  Final   Report Status PENDING  Incomplete  Blood Culture (routine x 2)     Status: None (Preliminary result)   Collection Time: 01/06/16  3:10 PM  Result Value Ref Range Status   Specimen Description BLOOD LEFT ANTECUBITAL  Final   Special Requests BOTTLES DRAWN AEROBIC ONLY 10CC  Final   Culture NO GROWTH < 24 HOURS  Final   Report Status PENDING  Incomplete  Urine culture     Status: Abnormal   Collection Time: 01/06/16  9:41 PM  Result Value Ref Range Status   Specimen Description URINE, CLEAN CATCH  Final   Special Requests NONE  Final   Culture MULTIPLE SPECIES PRESENT, SUGGEST RECOLLECTION (A)  Final   Report Status 01/07/2016 FINAL  Final  Gram stain     Status: None   Collection Time: 01/07/16  6:05 PM  Result Value Ref Range Status   Specimen Description FLUID KNEE RIGHT  Final   Special Requests NONE  Final   Gram Stain   Final    ABUNDANT WBC PRESENT,BOTH PMN AND  MONONUCLEAR NO ORGANISMS SEEN GRAM STAIN REVIEWED-AGREE WITH RESULT WILKINS,V    Report Status 01/07/2016 FINAL  Final  Anaerobic culture     Status: None (Preliminary result)   Collection Time: 01/07/16  6:07 PM  Result Value Ref Range Status   Specimen Description FLUID KNEE RIGHT  Final   Special Requests NONE  Final   Gram Stain   Final    ABUNDANT WBC PRESENT,BOTH PMN AND MONONUCLEAR NO ORGANISMS SEEN    Culture   Final    NO ANAEROBES ISOLATED; CULTURE IN PROGRESS FOR 5 DAYS   Report Status PENDING  Incomplete  Body fluid culture     Status: None (Preliminary result)   Collection Time: 01/07/16  6:13 PM  Result Value Ref Range Status   Specimen Description FLUID KNEE RIGHT  Final   Special Requests NONE  Final   Gram Stain   Final    ABUNDANT WBC PRESENT,BOTH PMN AND MONONUCLEAR NO ORGANISMS SEEN    Culture   Final    FEW GRAM NEGATIVE RODS CULTURE REINCUBATED FOR BETTER GROWTH CRITICAL RESULT CALLED TO, READ BACK BY AND VERIFIED WITH: Markham Jordan AT [id number]  01/08/16 BY L BENFIELD    Report Status PENDING  Incomplete  Body fluid culture     Status: None (Preliminary result)   Collection Time: 01/07/16  6:19 PM  Result Value Ref Range Status   Specimen Description FLUID KNEE LEFT  Final   Special Requests NONE  Final   Gram Stain   Final    FEW WBC PRESENT, PREDOMINANTLY MONONUCLEAR NO ORGANISMS SEEN    Culture   Final    FEW GRAM NEGATIVE RODS CULTURE REINCUBATED FOR BETTER GROWTH CRITICAL RESULT CALLED TO, READ BACK BY AND VERIFIED WITH: Markham Jordan AT [id number] 01/08/16 BY L BENFIELD    Report Status PENDING  Incomplete  Gram stain     Status: None   Collection Time: 01/07/16  6:19 PM  Result Value Ref Range Status   Specimen Description FLUID KNEE LEFT  Final   Special Requests NONE  Final   Gram Stain   Final    FEW WBC PRESENT, PREDOMINANTLY MONONUCLEAR NO ORGANISMS SEEN GRAM STAIN REVIEWED-AGREE WITH RESULT WILKINS,V    Report Status 01/07/2016 FINAL  Final   Studies/Results: Dg Knee Complete 4 Views Left  01/07/2016  CLINICAL DATA:  Bilateral knee pain and swelling for 2 weeks with redness, right worse than left. EXAM: LEFT KNEE - COMPLETE 4+ VIEW COMPARISON:  Left tibia/ fibula radiographs 12/16/2015 FINDINGS: No acute fracture, dislocation, or or sizable knee joint effusion is identified. There is mild medial compartment joint space narrowing. Geographic, sclerotic intramedullary lesions are again seen involving the distal femur and proximal tibia and are most consistent with bone infarcts. Vascular calcifications are noted. Mild nonspecific soft tissue thickening anterior to the knee. IMPRESSION: No acute osseous abnormality identified. 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 Electronically Signed   By: Logan Bores M.D.   On: 01/07/2016 14:28   Dg Knee Complete 4 Views Right  01/07/2016  CLINICAL DATA:  Bilateral knee pain and swelling for 2 weeks, right worse than left. Skin redness. EXAM: RIGHT KNEE - COMPLETE 4+ VIEW  COMPARISON:  Right tibia/ fibula radiographs 12/16/2015. Right knee radiographs 09/12/2012. FINDINGS: No acute fracture, dislocation, or sizable knee joint effusion is identified. There is mild diffuse soft tissue thickening along the anterior aspect of the knee. There is at most mild medial compartment joint space narrowing. The bones are  osteopenic. Vascular calcifications are noted. IMPRESSION: Anterior knee soft tissue thickening, nonspecific. No acute osseous abnormality identified. Electronically Signed   By: Logan Bores M.D.   On: 01/07/2016 14:25   Dg Fluoro Guided Needle Plc Aspiration/injection Loc  01/07/2016  Logan Bores, MD     01/07/2016  4:31 PM Bilateral knee joint aspirations performed. Only trace (1 mL or less) of serosanguinous fluid obtained from each knee with initial aspiration. 10 mL sterile saline then injected into each knee, reaspirated, and sent separately from initial aspirates.  Medications: I have reviewed the patient's current medications. Scheduled Meds: . apixaban  2.5 mg Oral BID  . bisoprolol  5 mg Oral Daily  . cholecalciferol  2,000 Units Oral QHS  . diclofenac sodium  2 g Topical QID  . dofetilide  125 mcg Oral BID  . feeding supplement (GLUCERNA SHAKE)  237 mL Oral BID BM  . gabapentin  100 mg Oral TID  . insulin aspart  0-20 Units Subcutaneous TID WC  . losartan  25 mg Oral Daily  . morphine  60 mg Oral Q8H  . piperacillin-tazobactam (ZOSYN)  IV  3.375 g Intravenous Q8H  . predniSONE  10 mg Oral Q breakfast  . vancomycin  500 mg Intravenous Q24H   Continuous Infusions:  PRN Meds:.acetaminophen, oxyCODONE Assessment/Plan:  Bilateral Knee Pain: Xray of left leg w/ distal femoral and proximal tibial bone infarcts, rt knee w/ non specific anterior soft tissue thickening. Aspiration of b/l knee joints negative for crystals. However one sample w/ 44,000 WBCs w/ neutrophil predominance, other sample w/ 7800 WBCs. Gram stain negative. Fluid culture with few GNR,  final result pending. Fungal culture pending. DDx include septic arthritis w/ elevated WBC on fluid culture although WBC not >50,000 she has predisposing RF including age, DM, recurrent cellulitis, and arthritis along w/ elevated ESR and CRP. Other causes could be viral, rheumatoid arthritis, and reactive arthritis considering she was just treated w/ course of vancomycin at [facility] for cellulitis 2 weeks ago however she does not have any extra articular sx.  - continue vanc - transition zosyn to rocephin to treat for presumed septic arthritis in an immunocompromised pt.  - Blood cultures 01/06/16 -->NTD - PT consulted - checking HLA-B27 antigen  UPDATE: discussed case w/ Dr. Sharol Given who will take pt to OR today for knee wash out. Pt made NPO and eliquis stopped.   Lymphedema-- uses a venous pump at home, has not used since d/c from [facility].  - resume lasix.   Type 2 DM:  - Resistant SSI  PAF (CHADS-VASc 6): Patient not currently in atrial fibrillation.  - Dofetilide 125 mcg BID - Bisoprolol 5 mg daily - eliquis 2.5 mg BID  HTN: Stable. 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 - Prednisone 10 mg daily  Chronic Back Pain: On chronic pain regimen. Recently switched from methadone. - Morphine 60 mg 12 hr q8h prn - Oxycodone 5 mg BID prn  DVT ppx: Will restart Eliquis after knee aspiration  Dispo: Disposition is deferred at this time, awaiting improvement of current medical problems.    The patient does have a current PCP (Imran Gertie Baron, MD) and does not need an [facility] hospital follow-up appointment after discharge.  The patient does have transportation limitations that hinder transportation to clinic appointments.  .Services Needed at time of discharge: Y = Yes, Blank = No PT:   OT:   RN:   Equipment:  Other:     LOS: 1 day   Norman Herrlich, MD 01/08/2016, 10:10 AM

## 2016-01-08 NOTE — Evaluation (Signed)
Physical Therapy Evaluation Patient Details Name: Cynthia Hicks MRN: [id number] DOB: May 06, 1933 Today's Date: 01/08/2016   History of Present Illness    Complicated 82 year old woman on steroids for over 20 years to control autoimmune hepatitis. She has had other immunosuppressive disease in the past including cyclosporine and azathioprine and mycophenolate which she has not tolerated. She has not been able to get her prednisone dose lower than 10 mg without a flare of the hepatitis. She has chronic lymphedema of the lower extremities right greater than left which she relates to previous surgery for a pelvic abscess requiring surgery. This has led to chronic lower extremity cellulitis. She was just hospitalized at [facility] and received a ten-day course of antibiotics about 2 weeks ago. Over the same interval she has had increasing bilateral knee pain right greater than left and has now developed symmetric areas of painful erythematous macules overlying her patellae. Her steroid dose was increased on 2 occasions recently but her symptoms have progressed and she can to this hospital for further evaluation.   Clinical Impression  Limited examination as pt going to OR for knee washout per MD note; pt has severe mobility limitation (see exam findings below) and will await postop evaluation to determine extent of needs.  Pt did state she does not want to "go back to rehab again!" however she does admit she needs to be at supervision level or better to go home.    MD please reorder postop if appropriate.  Thank you--    Follow Up Recommendations  TBD postop, may need SNF    Equipment Recommendations    TBD postop   Recommendations for Other Services       Precautions / Restrictions Precautions Precautions: Fall Precaution Comments: chair alarm      Mobility  Bed Mobility Overal bed mobility: Needs Assistance Bed Mobility: Sit to Supine       Sit to supine: Mod assist    General bed mobility comments: help to lift legs to bed, to reposition in bed to comfort  Transfers Overall transfer level: Needs assistance Equipment used: 1 person hand held assist Transfers: Sit to/from Stand;Stand Pivot Transfers Sit to Stand: Mod assist Stand pivot transfers: Max assist       General transfer comment: painful to bear weight, needs incr assist and time to adjust and weight shift to attempt to repostiong feet for transfer to bed, moaning in pain, fear of falling, has to be lowered to bed surface  Ambulation/Gait Ambulation/Gait assistance:  (deferred)              Stairs            Wheelchair Mobility    Modified Rankin (Stroke Patients Only)       Balance Overall balance assessment:  (needs to be assessed, limited by pain)                                           Pertinent Vitals/Pain Pain Assessment: 0-10 Pain Score: 10-Worst pain ever Pain Location: knees, back Pain Descriptors / Indicators: Constant Pain Intervention(s): Limited activity within patient's tolerance;Monitored during session    Home Living Family/patient expects to be discharged to:: Private residence Living Arrangements: Other relatives Available Help at Discharge: Family Type of Home: House Home Access: Stairs to enter Entrance Stairs-Rails: Right Entrance Stairs-Number of Steps: 3 Home Layout: One level Home Equipment:  Walker - 2 wheels      Prior Function                 Hand Dominance   Dominant Hand: Right    Extremity/Trunk Assessment   Upper Extremity Assessment: Defer to OT evaluation           Lower Extremity Assessment: Generalized weakness (bil ROM, strength, pain problems)      Cervical / Trunk Assessment: Kyphotic  Communication   Communication: No difficulties  Cognition Arousal/Alertness: Awake/alert Behavior During Therapy: Anxious Overall Cognitive Status: Within Functional Limits for tasks assessed                       General Comments General comments (skin integrity, edema, etc.): noting redness at knees, tight appearance of skin at fingers; pt informed PT she is going to OR now (not noted in chart before exam) so mobility eval limited but pt has severe limitations -- see impression statement    Exercises        Assessment/Plan    PT Assessment Patient needs continued PT services (see general comments section above)  PT Diagnosis Acute pain;Generalized weakness;Difficulty walking   PT Problem List Pain;Decreased mobility;Decreased activity tolerance;Decreased range of motion;Decreased strength  PT Treatment Interventions     PT Goals (Current goals can be found in the Care Plan section)      Frequency Other (Comment) (reassess postop and set plan of care)   Barriers to discharge Decreased caregiver support needs to be at supervision level; does not want to 'go back to rehab again!'    Co-evaluation               End of Session   Activity Tolerance: Patient limited by pain Patient left: in bed;with nursing/sitter in room           Time: 1214-1224 PT Time Calculation (min) (ACUTE ONLY): 10 min   Charges:   PT Evaluation $PT Eval Low Complexity: 1 Procedure     PT G Codes:        Herbie Drape 01/08/2016, 12:34 PM

## 2016-01-08 NOTE — Op Note (Signed)
01/06/2016 - 01/08/2016  3:26 PM  PATIENT:  Cynthia Hicks    PRE-OPERATIVE DIAGNOSIS:  Bilateral Knee Infection  POST-OPERATIVE DIAGNOSIS:  Same  PROCEDURE:  ARTHROSCOPY KNEE bilateral arthroscopic irrigation  SURGEON:  Newt Minion, MD  PHYSICIAN ASSISTANT:None ANESTHESIA:   General  PREOPERATIVE INDICATIONS:  Cynthia Hicks is a  82 y.o. female with a diagnosis of Bilateral Knee Infection who failed conservative measures and elected for surgical management.    The risks benefits and alternatives were discussed with the patient preoperatively including but not limited to the risks of infection, bleeding, nerve injury, cardiopulmonary complications, the need for revision surgery, among others, and the patient was willing to proceed.  OPERATIVE IMPLANTS: 2 Hemovac drains  OPERATIVE FINDINGS: Purulent drainage bilateral knees  OPERATIVE PROCEDURE: Patient brought the operating room and underwent a general anesthetic. After adequate anesthesia obtained patient's both lower extremities were prepped using ChloraPrep draped into a sterile field a timeout was called. Inferior medial and inferior lateral portals were established on both knees. A gravity fed irrigation was performed through both knees with 3 L of normal saline irrigated through both knees totally irrigation was clear. A Hemovac drain was placed deep within both joints this was covered with a sterile compressive dressing patient was extubated taken the PACU in stable condition.   Anticipate patient will need prolonged IV antibiotics and recommend infectious disease for antibiotic coverage and duration.   Patient is at increased risk for chronic osteomyelitis with the prolonged septic joints.

## 2016-01-08 NOTE — Progress Notes (Signed)
Prevalon boots removed per patient request at [facility].

## 2016-01-08 NOTE — Progress Notes (Signed)
Pharmacy Antibiotic Note  Cynthia Hicks is a 82 y.o. female admitted on 01/06/2016 with recurrent cellulitis.  Pharmacy has been consulted for Ceftriaxone dosing for septic arthritis. Day #3 Zosyn and Vancomycin since 01/06/16 per pharmacy consult. Right and left  R knee fluid cultures growing few GNRs  Zosyn now discontinued and switched to Ceftriaxone. Vanc to continue.   Plan: Ceftriaxone 2gm IV q24h  Continue Vancomycin 500 mg IV q24h  F/u renal fxn, C&S, clinical status and vanc trough at SS   Height: 5\' 1"  (154.9 cm) Weight: 107 lb (48.535 kg) IBW/kg (Calculated) : 47.8  Temp (24hrs), Avg:97.9 F (36.6 C), Min:97.8 F (36.6 C), Max:97.9 F (36.6 C)   Recent Labs Lab 01/06/16 1345 01/06/16 1520 01/07/16 0537 01/08/16 0515  WBC 24.6*  --  19.9* 16.6*  CREATININE 1.26*  --  1.14* 1.18*  LATICACIDVEN  --  2.12*  --   --     Estimated Creatinine Clearance: 28.7 mL/min (by C-G formula based on Cr of 1.18).    Allergies  Allergen Reactions  . Cyclosporine     Blasted bone marrow  . Azathioprine     Other reaction(s): Unknown  . Mycophenolate Other (See Comments)    Pre-op note mentions "intolerance" to mycophenolate.  Unknown reaction, unknown severity.  . Other     Other reaction(s): Other (See Comments) Uncoded Allergy. Allergen: Other Allergy: See Patient Chart for Details, Other Reaction: Toxicity  . Oysters [Shellfish Allergy] Nausea And Vomiting    Antimicrobials this admission: Vanc 6/2>> Zosyn 6/2>>6/4 Ceftriaxone 6/4>>  Dose adjustments this admission: None   Microbiology results: 6/2 BCx: ngtd 6/2 UCx: multiple speccies/recollect 6/3 R knee fluid: few GNRs 6/3 L knee fluid: few GNRs     Thank you for allowing pharmacy to be a part of this patient's care.  Nicole Cella, RPh Clinical Pharmacist Pager: [phone] 01/08/2016 11:21 AM

## 2016-01-08 NOTE — Consult Note (Signed)
ORTHOPAEDIC CONSULTATION  REQUESTING PHYSICIAN: Annia Belt, MD  Chief Complaint: Bilateral knee pain with effusion for 2 weeks.  HPI: Cynthia Hicks is a 82 y.o. female who presents with a two-week history of bilateral knee pain. Patient states he was initially admitted at [facility] was placed on IV vancomycin and was given multiple doses of steroids for elevated uric acid. Patient returned to [facility] was admitted in knees were aspirated and cultures were positive for gram-negative rods. Patient presents at this time for emergent irrigation and debridement of both knees.  Past Medical History  Diagnosis Date  . Hypertension   . Arthritis   . Diabetes mellitus   . Coronary artery disease     a.  MI 01/2003;  b.  02/2006 NSTEMI - LAD 80-53m, LCX nl, RCA 22m - RCA stented w/ Taxus DES;   c.  2009 Neg MV;  d. 11/2010 Echo - EF 65%;  e. 02/2013 MV: Carlton Adam Cardiolite that was low risk with no ischemia.  . Autoimmune liver disease     a.  Followed @ [facility] - on chronic prednisone and  sirolimus  . Hyperlipidemia     a. inability to stake statins due to liver disease  . Cellulitis     a. chronic cellulitis of LE  . CKD (chronic kidney disease), stage IV ([place])   . Bulging disc   . Sciatica   . Chronic back pain     a.  On Methadone  . Skin cancer (melanoma) ([place])   . Esophageal stricture     narrowing  . Diabetic neuropathy ([place])   . Sepsis ([place])   . PAF (paroxysmal atrial fibrillation) ([place])     a. Tikosyn initiated 10/2014;  CHA2DS2VASc = 6 (eliquis).  . Chronic diastolic CHF (congestive heart failure) ([place])     a. 06/2014 TEE: EF 60-65%, Gr 2 DD, mild MR, mildly dil LA, Grade IV descending thoracic Ao plaque.   Past Surgical History  Procedure Laterality Date  . Cardiac catheterization      Normal EF  . Coronary angioplasty  2007    2 DES placement to RCA complicated by a wire perforation  . Tubal ligation  1982  . Elbow surgery Left 1994    neuroma  removed   . Abscess drainage Right     side  . Lamenectomy  1989    only 1 disc surgery  . Heart stents  2007  and  2012    X's 1  and  2012 X's 2 stents  . Cardioversion N/A 10/31/2012    Procedure: CARDIOVERSION;  Surgeon: Larey Dresser, MD;  Location: [facility] ENDOSCOPY;  Service: Cardiovascular;  Laterality: N/A;  . Tee without cardioversion N/A 06/22/2014    Procedure: TRANSESOPHAGEAL ECHOCARDIOGRAM (TEE);  Surgeon: Larey Dresser, MD;  Location: [facility] ENDOSCOPY;  Service: Cardiovascular;  Laterality: N/A;  . Left heart catheterization with coronary angiogram N/A 08/29/2011    Procedure: LEFT HEART CATHETERIZATION WITH CORONARY ANGIOGRAM;  Surgeon: Wellington Hampshire, MD;  Location: [place] CATH LAB;  Service: Cardiovascular;  Laterality: N/A;  . Percutaneous coronary stent intervention (pci-s)  08/29/2011    Procedure: PERCUTANEOUS CORONARY STENT INTERVENTION (PCI-S);  Surgeon: Wellington Hampshire, MD;  Location: [facility] CATH LAB;  Service: Cardiovascular;;   Social History   Social History  . Marital Status: Married    Spouse Name: Jenny Reichmann  . Number of Children: 2  . Years of Education: N/A   Occupational History  .  real Sport and exercise psychologist     retired   Social History Main Topics  . Smoking status: Never Smoker   . Smokeless tobacco: Never Used  . Alcohol Use: No  . Drug Use: No  . Sexual Activity: Not Asked   Other Topics Concern  . None   Social History Narrative   Pt lives in [place] with husband.  She is retired.  She is not very active @ home.   Family History  Problem Relation Age of Onset  . Lung cancer Mother 84  . Heart attack Father 41  . Heart disease Father   . Congestive Heart Failure Father   . Heart disease Maternal Grandfather   . Hypertension Brother   . Hypertension Son   . Pancreatic cancer Mother    - negative except otherwise stated in the family history section Allergies  Allergen Reactions  . Cyclosporine     Blasted bone marrow  . Azathioprine     Other  reaction(s): Unknown  . Mycophenolate Other (See Comments)    Pre-op note mentions "intolerance" to mycophenolate.  Unknown reaction, unknown severity.  . Other     Other reaction(s): Other (See Comments) Uncoded Allergy. Allergen: Other Allergy: See Patient Chart for Details, Other Reaction: Toxicity  . Oysters [Shellfish Allergy] Nausea And Vomiting   Prior to Admission medications   Medication Sig Start Date End Date Taking? Authorizing Provider  alendronate (FOSAMAX) 70 MG tablet Take 70 mg by mouth every Monday. Take with a full glass of water on an empty stomach.   Yes Historical Provider, MD  apixaban (ELIQUIS) 2.5 MG TABS tablet Take 1 tablet (2.5 mg total) by mouth 2 (two) times daily. 03/07/15  Yes Larey Dresser, MD  Ascorbic Acid (VITAMIN C) 1000 MG tablet Take 1,000 mg by mouth 2 (two) times daily.    Yes Historical Provider, MD  bisoprolol (ZEBETA) 5 MG tablet Take 1 tablet (5 mg total) by mouth daily. 05/06/15  Yes Larey Dresser, MD  Cholecalciferol (VITAMIN D PO) Take 2,000 Units by mouth at bedtime.    Yes Historical Provider, MD  dofetilide (TIKOSYN) 125 MCG capsule Take 1 capsule (125 mcg total) by mouth 2 (two) times daily. 12/16/15  Yes Larey Dresser, MD  ezetimibe (ZETIA) 10 MG tablet Take 10 mg by mouth daily.     Yes Historical Provider, MD  Fish Oil-Cholecalciferol (FISH OIL + D3 PO) Take 1 capsule by mouth daily.    Yes Historical Provider, MD  furosemide (LASIX) 40 MG tablet Take 20-40 mg by mouth 2 (two) times daily. 40 mg in the morning and 20 mg in the afternoon   Yes Historical Provider, MD  gabapentin (NEURONTIN) 100 MG capsule Take 100 mg by mouth 3 (three) times daily.   Yes Historical Provider, MD  Insulin Regular Human (RELION R IJ) Inject 8-15 Units as directed 3 (three) times daily. Takes 8 units at breakfast and lunch. Takes 12-15 units at supper per sliding scale.   Yes Historical Provider, MD  losartan (COZAAR) 25 MG tablet TAKE 1 TABLET BY MOUTH EVERY  DAY 11/30/15  Yes Larey Dresser, MD  magnesium oxide (MAG-OX) 400 (241.3 MG) MG tablet Take 1 tablet (400 mg total) by mouth daily. 10/22/14  Yes Rhonda G Barrett, PA-C  morphine (MS CONTIN) 60 MG 12 hr tablet Take 1 tablet by mouth every 8 (eight) hours. 10/23/15  Yes Historical Provider, MD  omeprazole (PRILOSEC) 20 MG capsule Take 20 mg by mouth daily.  06/06/14  Yes Historical Provider, MD  oxyCODONE (OXY IR/ROXICODONE) 5 MG immediate release tablet Take 1 tablet (5 mg total) by mouth 2 (two) times daily as needed for moderate pain. 06/13/15  Yes Jule Ser, DO  polyvinyl alcohol (LIQUIFILM TEARS) 1.4 % ophthalmic solution Place 1 drop into both eyes daily as needed for dry eyes.   Yes Historical Provider, MD  pravastatin (PRAVACHOL) 80 MG tablet Take 1 tablet (80 mg total) by mouth every evening. 10/11/14  Yes Larey Dresser, MD  predniSONE (DELTASONE) 10 MG tablet Take 30 mg by mouth daily. Patient started on a 70 mg dose taper on 12-31-15   Yes Historical Provider, MD  metolazone (ZAROXOLYN) 2.5 MG tablet Take 2.5 mg by mouth every 14 (fourteen) days. Patient takes every 2 weeks.    Historical Provider, MD  nitroGLYCERIN (NITROSTAT) 0.4 MG SL tablet Place 1 tablet (0.4 mg total) under the tongue every 5 (five) minutes as needed for chest pain. 09/01/11   Rogelia Mire, NP   Dg Knee Complete 4 Views Left  01/07/2016  CLINICAL DATA:  Bilateral knee pain and swelling for 2 weeks with redness, right worse than left. EXAM: LEFT KNEE - COMPLETE 4+ VIEW COMPARISON:  Left tibia/ fibula radiographs 12/16/2015 FINDINGS: No acute fracture, dislocation, or or sizable knee joint effusion is identified. There is mild medial compartment joint space narrowing. Geographic, sclerotic intramedullary lesions are again seen involving the distal femur and proximal tibia and are most consistent with bone infarcts. Vascular calcifications are noted. Mild nonspecific soft tissue thickening anterior to the knee.  IMPRESSION: No acute osseous abnormality identified. 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 Electronically Signed   By: Logan Bores M.D.   On: 01/07/2016 14:28   Dg Knee Complete 4 Views Right  01/07/2016  CLINICAL DATA:  Bilateral knee pain and swelling for 2 weeks, right worse than left. Skin redness. EXAM: RIGHT KNEE - COMPLETE 4+ VIEW COMPARISON:  Right tibia/ fibula radiographs 12/16/2015. Right knee radiographs 09/12/2012. FINDINGS: No acute fracture, dislocation, or sizable knee joint effusion is identified. There is mild diffuse soft tissue thickening along the anterior aspect of the knee. There is at most mild medial compartment joint space narrowing. The bones are osteopenic. Vascular calcifications are noted. IMPRESSION: Anterior knee soft tissue thickening, nonspecific. No acute osseous abnormality identified. Electronically Signed   By: Logan Bores M.D.   On: 01/07/2016 14:25   Dg Fluoro Guided Needle Plc Aspiration/injection Loc  01/07/2016  Logan Bores, MD     01/07/2016  4:31 PM Bilateral knee joint aspirations performed. Only trace (1 mL or less) of serosanguinous fluid obtained from each knee with initial aspiration. 10 mL sterile saline then injected into each knee, reaspirated, and sent separately from initial aspirates.  - pertinent xrays, CT, MRI studies were reviewed and independently interpreted  Positive ROS: All other systems have been reviewed and were otherwise negative with the exception of those mentioned in the HPI and as above.  Physical Exam: General: Alert, no acute distress Cardiovascular: No pedal edema Respiratory: No cyanosis, no use of accessory musculature GI: No organomegaly, abdomen is soft and non-tender Skin: No lesions in the area of chief complaint, patient does have cellulitis around both knees. Neurologic: Sensation intact distally Psychiatric: Patient is competent for consent with normal mood and affect Lymphatic: No axillary or cervical  lymphadenopathy  MUSCULOSKELETAL:  On examination patient has an effusion of both knees she has cellulitis. Her cultures were positive for elevated white  blood cell count and gram-negative rods from her knees.  Assessment: Assessment: Septic arthritis bilateral knees.  Plan: Plan: We will plan for arthroscopic irrigation and debridement of both knees. Patient will need prolonged IV antibiotics recommend consultation with infectious disease for antibiotic and treatment coverage time. Patient most likely will need a PICC line.  Thank you for the consult and the opportunity to see Cynthia Hicks, Cynthia Hicks [phone] 1:31 PM

## 2016-01-09 ENCOUNTER — Encounter (HOSPITAL_COMMUNITY): Payer: Self-pay | Admitting: Orthopedic Surgery

## 2016-01-09 DIAGNOSIS — K754 Autoimmune hepatitis: Secondary | ICD-10-CM

## 2016-01-09 DIAGNOSIS — M00861 Arthritis due to other bacteria, right knee: Principal | ICD-10-CM

## 2016-01-09 DIAGNOSIS — M009 Pyogenic arthritis, unspecified: Secondary | ICD-10-CM

## 2016-01-09 DIAGNOSIS — Z9889 Other specified postprocedural states: Secondary | ICD-10-CM

## 2016-01-09 DIAGNOSIS — B9689 Other specified bacterial agents as the cause of diseases classified elsewhere: Secondary | ICD-10-CM

## 2016-01-09 DIAGNOSIS — M00862 Arthritis due to other bacteria, left knee: Secondary | ICD-10-CM

## 2016-01-09 DIAGNOSIS — Z79899 Other long term (current) drug therapy: Secondary | ICD-10-CM

## 2016-01-09 DIAGNOSIS — Z7901 Long term (current) use of anticoagulants: Secondary | ICD-10-CM

## 2016-01-09 LAB — GLUCOSE, CAPILLARY
GLUCOSE-CAPILLARY: 305 mg/dL — AB (ref 65–99)
Glucose-Capillary: 171 mg/dL — ABNORMAL HIGH (ref 65–99)
Glucose-Capillary: 177 mg/dL — ABNORMAL HIGH (ref 65–99)

## 2016-01-09 MED ORDER — APIXABAN 2.5 MG PO TABS
2.5000 mg | ORAL_TABLET | Freq: Two times a day (BID) | ORAL | Status: DC
  Administered 2016-01-09 – 2016-01-10 (×3): 2.5 mg via ORAL
  Filled 2016-01-09 (×3): qty 1

## 2016-01-09 MED ORDER — PANTOPRAZOLE SODIUM 20 MG PO TBEC
20.0000 mg | DELAYED_RELEASE_TABLET | Freq: Every day | ORAL | Status: DC
  Administered 2016-01-09 – 2016-01-10 (×2): 20 mg via ORAL
  Filled 2016-01-09 (×2): qty 1

## 2016-01-09 NOTE — Progress Notes (Signed)
Patient ID: Cynthia Hicks, female   DOB: Feb 15, 1933, 82 y.o.   MRN: [id number] Postoperative day 1 status post irrigation and debridement for bilateral septic knees. The right was worse than the left. We will have nursing change dressings today and remove the Hemovac drains. Patient may be weightbearing as tolerated anticipate a prolonged course of IV antibiotics.

## 2016-01-09 NOTE — ED Provider Notes (Signed)
CSN: [id number]     Arrival date & time 01/06/16  1238 History   First MD Initiated Contact with Patient 01/06/16 1348     Chief Complaint  Patient presents with  . Leg Pain     (Consider location/radiation/quality/duration/timing/severity/associated sxs/prior Treatment) HPI Comments: 82 year old woman w/ hx of  autoimmune hepatitis on immunosuppressives, DM, CAD comes in with cc of leg pain and in ability to walk. She has chronic lower extremity cellulitis, s/p recent treatment at [facility] with antibiotics and steroids. Over the same interval she has had increasing bilateral knee pain right greater than left and leg pain with redness. Pt is having severe pain with ambulation, and can hardly walk now. Denies fevers.   ROS 10 Systems reviewed and are negative for acute change except as noted in the HPI.      Patient is a 82 y.o. female presenting with leg pain. The history is provided by the patient.  Leg Pain   Past Medical History  Diagnosis Date  . Hypertension   . Arthritis   . Diabetes mellitus   . Coronary artery disease     a.  MI 01/2003;  b.  02/2006 NSTEMI - LAD 80-73m LCX nl, RCA 956m RCA stented w/ Taxus DES;   c.  2009 Neg MV;  d. 11/2010 Echo - EF 65%;  e. 02/2013 MV: LeCarlton Adamardiolite that was low risk with no ischemia.  . Autoimmune liver disease     a.  Followed @ [facility] - on chronic prednisone and  sirolimus  . Hyperlipidemia     a. inability to stake statins due to liver disease  . Cellulitis     a. chronic cellulitis of LE  . CKD (chronic kidney disease), stage IV (HC[place]  . Bulging disc   . Sciatica   . Chronic back pain     a.  On Methadone  . Skin cancer (melanoma) (HCWhe[facility] AFB  . Esophageal stricture     narrowing  . Diabetic neuropathy (HC[place]  . Sepsis (HCL[place]  . PAF (paroxysmal atrial fibrillation) (HCR[facility]    a. Tikosyn initiated 10/2014;  CHA2DS2VASc = 6 (eliquis).  . Chronic diastolic CHF (congestive heart failure) (HCFulton    a. 06/2014 TEE: EF  60-65%, Gr 2 DD, mild MR, mildly dil LA, Grade IV descending thoracic Ao plaque.   Past Surgical History  Procedure Laterality Date  . Cardiac catheterization      Normal EF  . Coronary angioplasty  2007    2 DES placement to RCA complicated by a wire perforation  . Tubal ligation  1982  . Elbow surgery Left 1994    neuroma removed   . Abscess drainage Right     side  . Lamenectomy  1989    only 1 disc surgery  . Heart stents  2007  and  2012    X's 1  and  2012 X's 2 stents  . Cardioversion N/A 10/31/2012    Procedure: CARDIOVERSION;  Surgeon: DaLarey DresserMD;  [facility];  Service: Cardiovascular;  Laterality: N/A;  . Tee without cardioversion N/A 06/22/2014    Procedure: TRANSESOPHAGEAL ECHOCARDIOGRAM (TEE);  Surgeon: DaLarey DresserMD;  [facility];  Service: Cardiovascular;  Laterality: N/A;  . Left heart catheterization with coronary angiogram N/A 08/29/2011    Procedure: LEFT HEART CATHETERIZATION WITH CORONARY ANGIOGRAM;  Surgeon: MuWellington HampshireMD;  [facility];  Service: Cardiovascular;  Laterality: N/A;  .  Percutaneous coronary stent intervention (pci-s)  08/29/2011    Procedure: PERCUTANEOUS CORONARY STENT INTERVENTION (PCI-S);  Surgeon: Wellington Hampshire, MD;  Location: [facility] LAB;  Service: Cardiovascular;;  . Knee arthroscopy Bilateral 01/08/2016    Procedure: ARTHROSCOPY KNEE;  Surgeon: Newt Minion, MD;  Location: [place];  Service: Orthopedics;  Laterality: Bilateral;   Family History  Problem Relation Age of Onset  . Lung cancer Mother 43  . Heart attack Father 58  . Heart disease Father   . Congestive Heart Failure Father   . Heart disease Maternal Grandfather   . Hypertension Brother   . Hypertension Son   . Pancreatic cancer Mother    Social History  Substance Use Topics  . Smoking status: Never Smoker   . Smokeless tobacco: Never Used  . Alcohol Use: No   OB History    No data available     Review of  Systems    Allergies  Cyclosporine; Azathioprine; Mycophenolate; Other; and Oysters  Home Medications   Prior to Admission medications   Medication Sig Start Date End Date Taking? Authorizing Provider  alendronate (FOSAMAX) 70 MG tablet Take 70 mg by mouth every Monday. Take with a full glass of water on an empty stomach.   Yes Historical Provider, MD  apixaban (ELIQUIS) 2.5 MG TABS tablet Take 1 tablet (2.5 mg total) by mouth 2 (two) times daily. 03/07/15  Yes Larey Dresser, MD  Ascorbic Acid (VITAMIN C) 1000 MG tablet Take 1,000 mg by mouth 2 (two) times daily.    Yes Historical Provider, MD  bisoprolol (ZEBETA) 5 MG tablet Take 1 tablet (5 mg total) by mouth daily. 05/06/15  Yes Larey Dresser, MD  Cholecalciferol (VITAMIN D PO) Take 2,000 Units by mouth at bedtime.    Yes Historical Provider, MD  dofetilide (TIKOSYN) 125 MCG capsule Take 1 capsule (125 mcg total) by mouth 2 (two) times daily. 12/16/15  Yes Larey Dresser, MD  ezetimibe (ZETIA) 10 MG tablet Take 10 mg by mouth daily.     Yes Historical Provider, MD  Fish Oil-Cholecalciferol (FISH OIL + D3 PO) Take 1 capsule by mouth daily.    Yes Historical Provider, MD  furosemide (LASIX) 40 MG tablet Take 20-40 mg by mouth 2 (two) times daily. 40 mg in the morning and 20 mg in the afternoon   Yes Historical Provider, MD  gabapentin (NEURONTIN) 100 MG capsule Take 100 mg by mouth 3 (three) times daily.   Yes Historical Provider, MD  Insulin Regular Human (RELION R IJ) Inject 8-15 Units as directed 3 (three) times daily. Takes 8 units at breakfast and lunch. Takes 12-15 units at supper per sliding scale.   Yes Historical Provider, MD  losartan (COZAAR) 25 MG tablet TAKE 1 TABLET BY MOUTH EVERY DAY 11/30/15  Yes Larey Dresser, MD  magnesium oxide (MAG-OX) 400 (241.3 MG) MG tablet Take 1 tablet (400 mg total) by mouth daily. 10/22/14  Yes Rhonda G Barrett, PA-C  morphine (MS CONTIN) 60 MG 12 hr tablet Take 1 tablet by mouth every 8 (eight)  hours. 10/23/15  Yes Historical Provider, MD  omeprazole (PRILOSEC) 20 MG capsule Take 20 mg by mouth daily.  06/06/14  Yes Historical Provider, MD  oxyCODONE (OXY IR/ROXICODONE) 5 MG immediate release tablet Take 1 tablet (5 mg total) by mouth 2 (two) times daily as needed for moderate pain. 06/13/15  Yes Jule Ser, DO  polyvinyl alcohol (LIQUIFILM TEARS) 1.4 % ophthalmic solution Place 1  drop into both eyes daily as needed for dry eyes.   Yes Historical Provider, MD  pravastatin (PRAVACHOL) 80 MG tablet Take 1 tablet (80 mg total) by mouth every evening. 10/11/14  Yes Larey Dresser, MD  predniSONE (DELTASONE) 10 MG tablet Take 30 mg by mouth daily. Patient started on a 70 mg dose taper on 12-31-15   Yes Historical Provider, MD  metolazone (ZAROXOLYN) 2.5 MG tablet Take 2.5 mg by mouth every 14 (fourteen) days. Patient takes every 2 weeks.    Historical Provider, MD  nitroGLYCERIN (NITROSTAT) 0.4 MG SL tablet Place 1 tablet (0.4 mg total) under the tongue every 5 (five) minutes as needed for chest pain. 09/01/11   Rogelia Mire, NP   BP 140/53 mmHg  Pulse 70  Temp(Src) 98.4 F (36.9 C) (Oral)  Resp 20  Ht 5' 1" (1.549 m)  Wt 107 lb (48.535 kg)  BMI 20.23 kg/m2  SpO2 99% Physical Exam  Constitutional: She is oriented to person, place, and time. She appears well-developed.  HENT:  Head: Normocephalic and atraumatic.  Eyes: EOM are normal.  Neck: Normal range of motion. Neck supple.  Cardiovascular: Normal rate.   Pulmonary/Chest: Effort normal.  Abdominal: Bowel sounds are normal.  Musculoskeletal: She exhibits edema and tenderness.  Pt has bilateral lower extremity erythema extending from the knee all the way down to the ankle. Warm to touch bilaterally. There is tenderness to palpation.  Neurological: She is alert and oriented to person, place, and time.  Skin: Skin is warm and dry. Rash noted.  Nursing note and vitals reviewed.   ED Course  Procedures (including critical  care time) Labs Review Labs Reviewed  URINE CULTURE - Abnormal; Notable for the following:    Culture MULTIPLE SPECIES PRESENT, SUGGEST RECOLLECTION (*)    All other components within normal limits  CBC - Abnormal; Notable for the following:    WBC 24.6 (*)    RBC 3.42 (*)    Hemoglobin 9.7 (*)    HCT 32.2 (*)    RDW 21.4 (*)    All other components within normal limits  BASIC METABOLIC PANEL - Abnormal; Notable for the following:    Chloride 99 (*)    Glucose, Bld 491 (*)    BUN 50 (*)    Creatinine, Ser 1.26 (*)    Calcium 7.9 (*)    GFR calc non Af Amer 39 (*)    GFR calc Af Amer 45 (*)    All other components within normal limits  URIC ACID - Abnormal; Notable for the following:    Uric Acid, Serum 8.7 (*)    All other components within normal limits  URINALYSIS, ROUTINE W REFLEX MICROSCOPIC (NOT AT [facility]) - Abnormal; Notable for the following:    Glucose, UA >1000 (*)    All other components within normal limits  SEDIMENTATION RATE - Abnormal; Notable for the following:    Sed Rate 100 (*)    All other components within normal limits  C-REACTIVE PROTEIN - Abnormal; Notable for the following:    CRP 15.8 (*)    All other components within normal limits  CBC - Abnormal; Notable for the following:    WBC 19.9 (*)    RBC 3.65 (*)    Hemoglobin 10.2 (*)    HCT 34.1 (*)    MCHC 29.9 (*)    RDW 21.3 (*)    All other components within normal limits  BASIC METABOLIC PANEL - Abnormal; Notable for  the following:    Glucose, Bld 157 (*)    BUN 39 (*)    Creatinine, Ser 1.14 (*)    Calcium 8.5 (*)    GFR calc non Af Amer 44 (*)    GFR calc Af Amer 51 (*)    All other components within normal limits  GLUCOSE, CAPILLARY - Abnormal; Notable for the following:    Glucose-Capillary 460 (*)    All other components within normal limits  URINE MICROSCOPIC-ADD ON - Abnormal; Notable for the following:    Squamous Epithelial / LPF 0-5 (*)    Bacteria, UA RARE (*)    All other  components within normal limits  GLUCOSE, CAPILLARY - Abnormal; Notable for the following:    Glucose-Capillary 192 (*)    All other components within normal limits  GLUCOSE, CAPILLARY - Abnormal; Notable for the following:    Glucose-Capillary 162 (*)    All other components within normal limits  GLUCOSE, CAPILLARY - Abnormal; Notable for the following:    Glucose-Capillary 189 (*)    All other components within normal limits  SYNOVIAL CELL COUNT + DIFF, W/ CRYSTALS - Abnormal; Notable for the following:    Color, Synovial PINK (*)    Appearance-Synovial TURBID (*)    WBC, Synovial 44000 (*)    Neutrophil, Synovial 86 (*)    Monocyte-Macrophage-Synovial Fluid 14 (*)    All other components within normal limits  CBC - Abnormal; Notable for the following:    WBC 16.6 (*)    RBC 3.35 (*)    Hemoglobin 9.1 (*)    HCT 31.6 (*)    MCHC 28.8 (*)    RDW 21.2 (*)    All other components within normal limits  GLUCOSE, CAPILLARY - Abnormal; Notable for the following:    Glucose-Capillary 270 (*)    All other components within normal limits  SYNOVIAL CELL COUNT + DIFF, W/ CRYSTALS - Abnormal; Notable for the following:    Color, Synovial PINK (*)    Appearance-Synovial TURBID (*)    WBC, Synovial 7800 (*)    Neutrophil, Synovial 82 (*)    Monocyte-Macrophage-Synovial Fluid 18 (*)    All other components within normal limits  COMPREHENSIVE METABOLIC PANEL - Abnormal; Notable for the following:    Glucose, Bld 123 (*)    BUN 32 (*)    Creatinine, Ser 1.18 (*)    Calcium 8.6 (*)    Total Protein 5.2 (*)    Albumin 1.8 (*)    GFR calc non Af Amer 42 (*)    GFR calc Af Amer 49 (*)    All other components within normal limits  GLUCOSE, CAPILLARY - Abnormal; Notable for the following:    Glucose-Capillary 185 (*)    All other components within normal limits  GLUCOSE, CAPILLARY - Abnormal; Notable for the following:    Glucose-Capillary 155 (*)    All other components within normal  limits  GLUCOSE, CAPILLARY - Abnormal; Notable for the following:    Glucose-Capillary 312 (*)    All other components within normal limits  GLUCOSE, CAPILLARY - Abnormal; Notable for the following:    Glucose-Capillary 180 (*)    All other components within normal limits  GLUCOSE, CAPILLARY - Abnormal; Notable for the following:    Glucose-Capillary 155 (*)    All other components within normal limits  GLUCOSE, CAPILLARY - Abnormal; Notable for the following:    Glucose-Capillary 106 (*)    All other components within normal  limits  GLUCOSE, CAPILLARY - Abnormal; Notable for the following:    Glucose-Capillary 171 (*)    All other components within normal limits  GLUCOSE, CAPILLARY - Abnormal; Notable for the following:    Glucose-Capillary 305 (*)    All other components within normal limits  I-STAT CG4 LACTIC ACID, ED - Abnormal; Notable for the following:    Lactic Acid, Venous 2.12 (*)    All other components within normal limits  CULTURE, BLOOD (ROUTINE X 2)  CULTURE, BLOOD (ROUTINE X 2)  GRAM STAIN  BODY FLUID CULTURE  ANAEROBIC CULTURE  BODY FLUID CULTURE  GRAM STAIN  FUNGUS CULTURE WITH STAIN (NOT AT [facility])  MAGNESIUM  GLUCOSE, CAPILLARY  HLA-B27 ANTIGEN  I-STAT CG4 LACTIC ACID, ED    Imaging Review Dg Fluoro Guided Needle Plc Aspiration/injection Loc  01/07/2016  Logan Bores, MD     01/07/2016  4:31 PM Bilateral knee joint aspirations performed. Only trace (1 mL or less) of serosanguinous fluid obtained from each knee with initial aspiration. 10 mL sterile saline then injected into each knee, reaspirated, and sent separately from initial aspirates.  I have personally reviewed and evaluated these images and lab results as part of my medical decision-making.   EKG Interpretation None      MDM   Final diagnoses:  Cellulitis of lower extremity, unspecified laterality    Pt comes in with leg swelling and pain. She is immunosuppressed. She has hx of recent  antibiotics for cellulitis - and her symptoms have gotten better once the antibiotics were stopped. Clinical concerns are for cellulitis. Knee is not grossly swollen - and the rash to the tibia makes this more likely to be cellulitis. [place] is elevated, glucose is elevated  Could be steroids as well.     Varney Biles, MD 01/09/16 [phone]

## 2016-01-09 NOTE — Consult Note (Signed)
[place] for Infectious Disease       Reason for Consult: native septic arthritis    Referring Physician: Dr. Daryll Drown  Principal Problem:   Knee pain, bilateral Active Problems:   Diabetes mellitus ([place])   HTN (hypertension)   Autoimmune hepatitis ([place])   Chronic back pain   Atrial fibrillation (HCC)   CKD (chronic kidney disease), stage III   Malnutrition of moderate degree   Pressure ulcer   Septic arthritis of knee ([place])   . apixaban  2.5 mg Oral BID  . bisoprolol  5 mg Oral Daily  . cefTRIAXone (ROCEPHIN)  IV  2 g Intravenous Q24H  . cholecalciferol  2,000 Units Oral QHS  . diclofenac sodium  2 g Topical QID  . dofetilide  125 mcg Oral BID  . feeding supplement (GLUCERNA SHAKE)  237 mL Oral BID BM  . furosemide  40 mg Oral q morning - 10a   And  . furosemide  20 mg Oral Daily  . gabapentin  100 mg Oral TID  . insulin aspart  0-20 Units Subcutaneous TID WC  . losartan  25 mg Oral Daily  . morphine  60 mg Oral Q8H  . pantoprazole  20 mg Oral Daily  . predniSONE  10 mg Oral Q breakfast    Recommendations: Approximately 21 days of antibiotics Would wait for picc line in case fluoroquinolone sensitive can use oral therapy   Assessment: She has bilateral septic arthritis with both cultures growing a GNR.  It is unusual to have both knees affected but with immunosuppression, can be more susceptible.  Autoimmune hepatitis with chronic immunosuppression.  Antibiotics: ceftriaxone  HPI: Cynthia Hicks is a 82 y.o. female with autoimmune hepatitis on chronic immunosuppressive therapy who presented with increased knee pain and swelling.  Was recently treated with antibiotics and then increased steroids for possible gout but continued to worsen.  She was taken to the OR by Dr. Sharol Given yesterday and washout with cultures in both knees as above.  No preceeding fever or chills. Leukocytosis to 24, CRP 24, ESR 100.     Review of Systems:  Constitutional: negative for  fevers, chills and malaise Gastrointestinal: negative for diarrhea Integument/breast: negative for rash All other systems reviewed and are negative   Past Medical History  Diagnosis Date  . Hypertension   . Arthritis   . Diabetes mellitus   . Coronary artery disease     a.  MI 01/2003;  b.  02/2006 NSTEMI - LAD 80-79m LCX nl, RCA 961m RCA stented w/ Taxus DES;   c.  2009 Neg MV;  d. 11/2010 Echo - EF 65%;  e. 02/2013 MV: LeCarlton Adamardiolite that was low risk with no ischemia.  . Autoimmune liver disease     a.  Followed @ [facility] - on chronic prednisone and  sirolimus  . Hyperlipidemia     a. inability to stake statins due to liver disease  . Cellulitis     a. chronic cellulitis of LE  . CKD (chronic kidney disease), stage IV (HCSearles Valley  . Bulging disc   . Sciatica   . Chronic back pain     a.  On Methadone  . Skin cancer (melanoma) (HCOcean Springs  . Esophageal stricture     narrowing  . Diabetic neuropathy (HCHillcrest Heights  . Sepsis (HCLopatcong [place]  . PAF (paroxysmal atrial fibrillation) (HCBennington    a. Tikosyn initiated 10/2014;  CHA2DS2VASc = 6 (eliquis).  . Chronic  diastolic CHF (congestive heart failure) ([place])     a. 06/2014 TEE: EF 60-65%, Gr 2 DD, mild MR, mildly dil LA, Grade IV descending thoracic Ao plaque.    Social History  Substance Use Topics  . Smoking status: Never Smoker   . Smokeless tobacco: Never Used  . Alcohol Use: No    Family History  Problem Relation Age of Onset  . Lung cancer Mother 73  . Heart attack Father 47  . Heart disease Father   . Congestive Heart Failure Father   . Heart disease Maternal Grandfather   . Hypertension Brother   . Hypertension Son   . Pancreatic cancer Mother     Allergies  Allergen Reactions  . Cyclosporine     Blasted bone marrow  . Azathioprine     Other reaction(s): Unknown  . Mycophenolate Other (See Comments)    Pre-op note mentions "intolerance" to mycophenolate.  Unknown reaction, unknown severity.  . Other     Other reaction(s): Other  (See Comments) Uncoded Allergy. Allergen: Other Allergy: See Patient Chart for Details, Other Reaction: Toxicity  . Oysters [Shellfish Allergy] Nausea And Vomiting    Physical Exam: Constitutional: in no apparent distress and alert  Filed Vitals:   01/09/16 0852 01/09/16 1411  BP: 156/79 140/53  Pulse: 92 70  Temp:  98.4 F (36.9 C)  Resp:  20   EYES: anicteric ENMT: no thrush Cardiovascular: Cor RRR Respiratory: CTAB; normal respiratory effort GI: Bowel sounds are normal, liver is not enlarged, spleen is not enlarged Musculoskeletal: no pedal edema noted Skin: negatives: no rash Hematologic: no cervical lad  Lab Results  Component Value Date   WBC 16.6* 01/08/2016   HGB 9.1* 01/08/2016   HCT 31.6* 01/08/2016   MCV 94.3 01/08/2016   PLT 184 01/08/2016    Lab Results  Component Value Date   CREATININE 1.18* 01/08/2016   BUN 32* 01/08/2016   NA 144 01/08/2016   K 4.7 01/08/2016   CL 108 01/08/2016   CO2 28 01/08/2016    Lab Results  Component Value Date   ALT 29 01/08/2016   AST 27 01/08/2016   ALKPHOS 86 01/08/2016     Microbiology: Recent Results (from the past 240 hour(s))  Blood Culture (routine x 2)     Status: None (Preliminary result)   Collection Time: 01/06/16  3:00 PM  Result Value Ref Range Status   Specimen Description BLOOD RIGHT ANTECUBITAL  Final   Special Requests   Final    BOTTLES DRAWN AEROBIC AND ANAEROBIC BLUE 10CC RED 5CC   Culture NO GROWTH 3 DAYS  Final   Report Status PENDING  Incomplete  Blood Culture (routine x 2)     Status: None (Preliminary result)   Collection Time: 01/06/16  3:10 PM  Result Value Ref Range Status   Specimen Description BLOOD LEFT ANTECUBITAL  Final   Special Requests BOTTLES DRAWN AEROBIC ONLY 10CC  Final   Culture NO GROWTH 3 DAYS  Final   Report Status PENDING  Incomplete  Urine culture     Status: Abnormal   Collection Time: 01/06/16  9:41 PM  Result Value Ref Range Status   Specimen Description  URINE, CLEAN CATCH  Final   Special Requests NONE  Final   Culture MULTIPLE SPECIES PRESENT, SUGGEST RECOLLECTION (A)  Final   Report Status 01/07/2016 FINAL  Final  Gram stain     Status: None   Collection Time: 01/07/16  6:05 PM  Result Value Ref  Range Status   Specimen Description FLUID KNEE RIGHT  Final   Special Requests NONE  Final   Gram Stain   Final    ABUNDANT WBC PRESENT,BOTH PMN AND MONONUCLEAR NO ORGANISMS SEEN GRAM STAIN REVIEWED-AGREE WITH RESULT [place]    Report Status 01/07/2016 FINAL  Final  Anaerobic culture     Status: None (Preliminary result)   Collection Time: 01/07/16  6:07 PM  Result Value Ref Range Status   Specimen Description FLUID KNEE RIGHT  Final   Special Requests NONE  Final   Gram Stain   Final    ABUNDANT WBC PRESENT,BOTH PMN AND MONONUCLEAR NO ORGANISMS SEEN    Culture   Final    NO ANAEROBES ISOLATED; CULTURE IN PROGRESS FOR 5 DAYS   Report Status PENDING  Incomplete  Body fluid culture     Status: None (Preliminary result)   Collection Time: 01/07/16  6:13 PM  Result Value Ref Range Status   Specimen Description FLUID KNEE RIGHT  Final   Special Requests NONE  Final   Gram Stain   Final    ABUNDANT WBC PRESENT,BOTH PMN AND MONONUCLEAR NO ORGANISMS SEEN    Culture   Final    FEW GRAM NEGATIVE RODS IDENTIFICATION AND SUSCEPTIBILITIES TO FOLLOW CRITICAL RESULT CALLED TO, READ BACK BY AND VERIFIED WITH: Markham Jordan AT 7290 01/08/16 BY L BENFIELD    Report Status PENDING  Incomplete  Body fluid culture     Status: None (Preliminary result)   Collection Time: 01/07/16  6:19 PM  Result Value Ref Range Status   Specimen Description FLUID KNEE LEFT  Final   Special Requests NONE  Final   Gram Stain   Final    FEW WBC PRESENT, PREDOMINANTLY MONONUCLEAR NO ORGANISMS SEEN    Culture   Final    FEW GRAM NEGATIVE RODS CULTURE REINCUBATED FOR BETTER GROWTH CRITICAL RESULT CALLED TO, READ BACK BY AND VERIFIED WITH: Markham Jordan AT 2111  01/08/16 BY L BENFIELD    Report Status PENDING  Incomplete  Gram stain     Status: None   Collection Time: 01/07/16  6:19 PM  Result Value Ref Range Status   Specimen Description FLUID KNEE LEFT  Final   Special Requests NONE  Final   Gram Stain   Final    FEW WBC PRESENT, PREDOMINANTLY MONONUCLEAR NO ORGANISMS SEEN GRAM STAIN [place] WITH RESULT [place],V    Report Status 01/07/2016 FINAL  Final    COMER, Herbie [place], [facility] [email or web address] [phone] pager  [phone] cell 01/09/2016, 4:30 PM

## 2016-01-09 NOTE — NC FL2 (Signed)
[place] LEVEL OF CARE SCREENING TOOL     IDENTIFICATION  Patient Name: Cynthia Hicks Birthdate: [birth year] Sex: female Admission Date (Current Location): 01/06/2016  [facility] and [place] Number:  Herbalist and Address:  [facility], [place] [address] [place], [place] [address]      Provider Number: [id number]  Attending Physician Name and Address:  Sid Falcon, MD  Relative Name and Phone Number:       Current Level of Care: Hospital Recommended Level of Care: [place] Prior Approval Number:    Date Approved/Denied:   PASRR Number: [id number]  Discharge Plan: SNF    Current Diagnoses: Patient Active Problem List   Diagnosis Date Noted  . Malnutrition of moderate degree 01/08/2016  . Pressure ulcer 01/08/2016  . Knee pain, bilateral 01/06/2016  . PICC (peripherally inserted central catheter) in place   . Sepsis ([place]) 06/08/2015  . Sepsis due to methicillin resistant Staphylococcus aureus ([place]) 06/08/2015  . Visit for monitoring Tikosyn therapy   . CKD (chronic kidney disease), stage III 06/24/2014  . CAP (community acquired pneumonia)   . Acute on chronic diastolic CHF (congestive heart failure) ([place])   . Cellulitis of right lower extremity   . Diabetes mellitus type 2, uncontrolled ([place])   . Polyneuropathy in diabetes(357.2) 03/04/2014  . Localized, primary osteoarthritis of shoulder region 11/02/2013  . Chronic diastolic CHF (congestive heart failure) ([place]) 05/02/2013  . Biliary calculi, common bile duct 02/28/2013  . Fungal infection of nail 02/28/2013  . H/O immunosuppressive therapy 02/28/2013  . Long term (current) use of anticoagulants 10/31/2012  . Swelling of limb 10/22/2012  . Pain in limb 10/22/2012  . Lymphedema 10/22/2012  . Atrial fibrillation ([place]) 09/04/2012  . Cellulitis 07/10/2012  . Arteriosclerosis of coronary artery 04/04/2012  . Encounter for long-term (current) use of  steroids 04/04/2012  . Diabetes mellitus, type 2 ([place]) 04/04/2012  . HLD (hyperlipidemia) 04/04/2012  . Disease of liver 04/04/2012  . Heart attack ([place]) 04/04/2012  . Long term current use of systemic steroids 04/04/2012  . Abnormal abdominal MRI 03/03/2012  . Abdominal pain 12/04/2011  . Systolic CHF, chronic ([place]) 09/15/2011  . NSTEMI (non-ST elevated myocardial infarction) ([place]) 08/30/2011  . Systolic CHF, acute ([place]) 08/30/2011  . ACS (acute coronary syndrome) ([place]) 08/29/2011  . Coronary artery disease   . Chronic back pain   . Chronic kidney disease   . Autoimmune liver disease   . Carotid bruit 07/02/2011  . Hypoxemia 11/17/2010  . Palpitations 11/06/2010  . OTHER NONINFECTIOUS DISORDERS LYMPHATIC CHANNELS 07/21/2009  . Diabetes mellitus ([place]) 07/07/2009  . Anemia of chronic disease 07/07/2009  . HTN (hypertension) 07/07/2009  . GERD 07/07/2009  . Autoimmune hepatitis ([place]) 07/07/2009  . DEGENERATIVE DISC DISEASE 07/07/2009  . PSOAS MUSCLE ABSCESS 07/05/2009  . HYPERLIPIDEMIA-MIXED 01/02/2009  . VENOUS INSUFFICIENCY, LEGS 01/02/2009    Orientation RESPIRATION BLADDER Height & Weight     Self, Time, Situation, Place  Normal Continent Weight: 107 lb (48.535 kg) Height:  5\' 1"  (154.9 cm)  BEHAVIORAL SYMPTOMS/MOOD NEUROLOGICAL BOWEL NUTRITION STATUS      Continent Diet (carb modified)  AMBULATORY STATUS COMMUNICATION OF NEEDS Skin   Extensive Assist Verbally PU Stage and Appropriate Care   PU Stage 2 Dressing:  (PRN, located on sacrum)                   Personal Care Assistance Level of Assistance  Bathing, Dressing Bathing  Assistance: Maximum assistance   Dressing Assistance: Maximum assistance     Functional Limitations Info  Sight Sight Info: Impaired        SPECIAL CARE FACTORS FREQUENCY  PT (By licensed PT), OT (By licensed OT)     PT Frequency: 5/wk OT Frequency: 5/wk            Contractures      Additional Factors Info  Code  Status, Allergies, Insulin Sliding Scale Code Status Info: Partial (no intubation) Allergies Info: Cyclosporine, Azathioprine, Mycophenolate, Other, Oysters   Insulin Sliding Scale Info: 3/wk       Current Medications (01/09/2016):  This is the current hospital active medication list Current Facility-Administered Medications  Medication Dose Route Frequency Provider Last Rate Last Dose  . 0.9 %  sodium chloride infusion   Intravenous Continuous [place]e Score V, MD 10 mL/hr at 01/09/16 1200    . acetaminophen (TYLENOL) tablet 650 mg  650 mg Oral Q6H PRN Newt Minion, MD       Or  . acetaminophen (TYLENOL) suppository 650 mg  650 mg Rectal Q6H PRN Newt Minion, MD      . apixaban Arne [place]) tablet 2.5 mg  2.5 mg Oral BID Liberty Handy, MD   2.5 mg at 01/09/16 1101  . bisoprolol (ZEBETA) tablet 5 mg  5 mg Oral Daily Annia Belt, MD   5 mg at 01/09/16 1103  . cefTRIAXone (ROCEPHIN) 2 g in dextrose 5 % 50 mL IVPB  2 g Intravenous Q24H Wendee Beavers, RPH   2 g at 01/09/16 1243  . cholecalciferol (VITAMIN D) tablet 2,000 Units  2,000 Units Oral QHS Norman Herrlich, MD   2,000 Units at 01/08/16 2145  . diclofenac sodium (VOLTAREN) 1 % transdermal gel 2 g  2 g Topical QID Norman Herrlich, MD   2 g at 01/09/16 1108  . dofetilide (TIKOSYN) capsule 125 mcg  125 mcg Oral BID Norman Herrlich, MD   125 mcg at 01/09/16 [phone]  . feeding supplement (GLUCERNA SHAKE) (GLUCERNA SHAKE) liquid 237 mL  237 mL Oral BID BM Annia Belt, MD   237 mL at 01/07/16 1400  . furosemide (LASIX) tablet 40 mg  40 mg Oral q morning - 10a Annia Belt, MD   40 mg at 01/09/16 1102   And  . furosemide (LASIX) tablet 20 mg  20 mg Oral Daily Annia Belt, MD      . gabapentin (NEURONTIN) capsule 100 mg  100 mg Oral TID Annia Belt, MD   100 mg at 01/09/16 1101  . insulin aspart (novoLOG) injection 0-20 Units  0-20 Units Subcutaneous TID WC Norman Herrlich, MD   15 Units at 01/09/16 1232  . losartan  (COZAAR) tablet 25 mg  25 mg Oral Daily Annia Belt, MD   25 mg at 01/09/16 1102  . methocarbamol (ROBAXIN) tablet 500 mg  500 mg Oral Q6H PRN Newt Minion, MD   500 mg at 01/09/16 1106   Or  . methocarbamol (ROBAXIN) 500 mg in dextrose 5 % 50 mL IVPB  500 mg Intravenous Q6H PRN Newt Minion, MD      . metoCLOPramide (REGLAN) tablet 5-10 mg  5-10 mg Oral Q8H PRN Newt Minion, MD       Or  . metoCLOPramide (REGLAN) injection 5-10 mg  5-10 mg Intravenous Q8H PRN Newt Minion, MD      . morphine (MS CONTIN) 12  hr tablet 60 mg  60 mg Oral Q8H Annia Belt, MD   60 mg at 01/09/16 0449  . ondansetron (ZOFRAN) tablet 4 mg  4 mg Oral Q6H PRN Newt Minion, MD       Or  . ondansetron [facility]) injection 4 mg  4 mg Intravenous Q6H PRN Newt Minion, MD      . oxyCODONE (Oxy IR/ROXICODONE) immediate release tablet 5 mg  5 mg Oral BID PRN Liberty Handy, MD   5 mg at 01/09/16 0855  . predniSONE (DELTASONE) tablet 10 mg  10 mg Oral Q breakfast Annia Belt, MD   10 mg at 01/09/16 [id number]     Discharge Medications: Please see discharge summary for a list of discharge medications.  Relevant Imaging Results:  Relevant Lab Results:   Additional Information SS#: [id number]  Cranford Mon, [place]

## 2016-01-09 NOTE — Progress Notes (Signed)
  Date: 01/09/2016  Patient name: Cynthia Hicks  Medical record number: [id number]  Date of birth: 01-Feb-1933   This patient has been seen and the plan of care was discussed with the house staff. Please see Dr. Barbera Setters note for complete details. I concur with his findings.  Sid Falcon, MD 01/09/2016, 2:11 PM

## 2016-01-09 NOTE — Care Management Note (Addendum)
Case Management Note  Patient Details  Name: Cynthia Hicks MRN: [id number] Date of Birth: Nov 16, 1932  Subjective/Objective:                 Spoke with patient in the room. She states she lives at home with her sister in law who helps to care for her. Her SIL drives her to appointments. She has a cane and two walkers, and is able to obtain a WC from her son. She states she has been OOB three times today to chair but has not walked a long distance. She states that she will be getting a PICC line, but is unsure how long she will need IV Abx. She states she has used [facility] for infusion services in the past and would like to use them again if needed. She also states that she is active with [facility] for RN and PT.    Action/Plan:  SNF vs. resume services with [facility] for RN PT, at DC. If home will need Home Infusion services. Referral made to [facility] about case in event of home DC.  Addendum- DC to SNF today as facilitated by CSW.  Expected Discharge Date:                  Expected Discharge Plan:  [place]  In-House Referral:     Discharge planning Services  CM Consult  Post Acute Care Choice:  Durable Medical Equipment, Home Health Choice offered to:  Patient  DME Arranged:    DME Agency:     HH Arranged:  RN, PT (Prior to admission) [place]:  [place]  Status of Service:  In process, will continue to follow  Medicare Important Message Given:  Yes Date Medicare IM Given:    Medicare IM give by:    Date Additional Medicare IM Given:    Additional Medicare Important Message give by:     If discussed at [place] of Stay Meetings, dates discussed:    Additional Comments:  Carles Collet, RN 01/09/2016, 2:45 PM

## 2016-01-09 NOTE — Progress Notes (Signed)
Pharmacist Student Provided - Patient Medication Education Education Environmental health practitioner Service Patient  Education: Educated the patient on some of current medications, specifically ceftriaxone and Lasix. Reviewed side effects with the patient. Answered the patient's medication specific questions. These medications are on the patient's current med list and are subject to change upon discharge.  Cynthia Hicks  P4 PharmD Candidate

## 2016-01-09 NOTE — Clinical Social Work Note (Signed)
Clinical Social Work Assessment  Patient Details  Name: Cynthia Hicks MRN: [id number] Date of Birth: 29-Mar-1933  Date of referral:  01/09/16               Reason for consult:  Facility Placement                Permission sought to share information with:  Chartered certified accountant granted to share information::  Yes, Verbal Permission Granted  Name::        Agency::  [facility] SNFs  Relationship::     Contact Information:     Housing/Transportation Living arrangements for the past 2 months:  [place] of Information:  Patient Patient Interpreter Needed:  None Criminal Activity/Legal Involvement Pertinent to Current Situation/Hospitalization:    Significant Relationships:  Adult Children Lives with:   (states she lives with sister-in-law) Do you feel safe going back to the place where you live?  Yes Need for family participation in patient care:  No (Coment)  Care giving concerns:  Pt lives with sister- in-law who pt reports is able to help a fair amount with physical assist but acknowledges not enough support given current impairment.   Social Worker assessment / plan: CSW spoke with pt concerning PT recommendation for SNF.  Pt frustrated that SNF is being recommended and asked if she could have a few days in the hospital to recover- states she has been to SNF before and did not have a good experience there/was kept weeks longer than she felt was needed.  CSW spoke further with pt about being medically stable for DC vs physically and explained that MD might be ready to DC before she could be safe returning home.  Employment status:  Retired Forensic scientist:  Medicare PT Recommendations:  [place] / Referral to community resources:  [place]  Patient/Family's Response to care: Pt agreeable to CSW looking into options (prefers [facility] if she has to go) but is not happy about the prospect  of going to SNF and still prioritizes going home.  Patient/Family's Understanding of and Emotional Response to Diagnosis, Current Treatment, and Prognosis:  Worried about being discharged too soon and is hopeful she can just return home and get therapy there.  Emotional Assessment Appearance:  Appears stated age Attitude/Demeanor/Rapport:    Affect (typically observed):  Appropriate Orientation:  Oriented to Self, Oriented to Place, Oriented to  Time, Oriented to Situation Alcohol / Substance use:  Not Applicable Psych involvement (Current and /or in the community):  No (Comment)  Discharge Needs  Concerns to be addressed:    Readmission within the last 30 days:  No Current discharge risk:  Physical Impairment Barriers to Discharge:  Continued Medical Work up   [employer], LCSW 01/09/2016, 3:32 PM

## 2016-01-09 NOTE — Care Management Important Message (Signed)
Important Message  Patient Details  Name: Cynthia Hicks MRN: [id number] Date of Birth: 30-Nov-1932   Medicare Important Message Given:  Yes    [name] May 01/09/2016, 12:23 PM

## 2016-01-09 NOTE — Evaluation (Signed)
Physical Therapy Re-Evaluation/Treatment Patient Details Name: Cynthia Hicks MRN: [id number] DOB: 1932-11-01 Today's Date: 01/09/2016   History of Present Illness  82 y.o. female with a diagnosis of Bilateral Knee Infection who failed conservative measures. Pt underwent bilateral irrigation and debridement on 01/08/16.PMH: hypertension, diabetes, CAD, CKD, sciatica, chronic back pain, CHF.   Clinical Impression  Pt seen for PT reassessment and treatment as pt now s/p irrigation and debridement of bilateral knees. Pt currently requiring moderate assistance with sit/stand transfers and stand-pivot from bed to chair. Pt unable to attempt ambulation due to reports of pain. When discussing D/C plan with the patient, she states that she wants to go directly home. PT did discuss that based on her current mobility level, she would benefit from going to SNF for further rehabilitation but will continue to assess as her mobility and plan as she progresses.     Follow Up Recommendations SNF;Supervision for mobility/OOB    Equipment Recommendations  None recommended by PT    Recommendations for Other Services       Precautions / Restrictions Precautions Precautions: Fall Restrictions Weight Bearing Restrictions: Yes RLE Weight Bearing: Weight bearing as tolerated LLE Weight Bearing: Weight bearing as tolerated      Mobility  Bed Mobility Overal bed mobility: Needs Assistance Bed Mobility: Supine to Sit     Supine to sit: Mod assist     General bed mobility comments: assist with trunk to come to sitting and using pad to turn to EOB  Transfers Overall transfer level: Needs assistance Equipment used: Rolling walker (2 wheeled) Transfers: Sit to/from Stand Sit to Stand: Mod assist;From elevated surface Stand pivot transfers: Mod assist       General transfer comment: transfer performed X2. Pt reporting pain as primary limiting factor.   Ambulation/Gait             General Gait  Details: unable to tolerate  Stairs            Wheelchair Mobility    Modified Rankin (Stroke Patients Only)       Balance Overall balance assessment: Needs assistance Sitting-balance support: Bilateral upper extremity supported Sitting balance-Leahy Scale: Fair Sitting balance - Comments: Using UE support    Standing balance support: Bilateral upper extremity supported Standing balance-Leahy Scale: Poor Standing balance comment: using rw for support                             Pertinent Vitals/Pain Pain Assessment: Faces Faces Pain Scale: Hurts whole lot Pain Location: knees Pain Descriptors / Indicators: Grimacing;Guarding;Moaning Pain Intervention(s): Limited activity within patient's tolerance;Monitored during session    Home Living Family/patient expects to be discharged to:: Private residence Living Arrangements: Other relatives Available Help at Discharge: Family Type of Home: House Home Access: Stairs to enter Entrance Stairs-Rails: Right Entrance Stairs-Number of Steps: 3 Home Layout: One level Home Equipment: Environmental consultant - 2 wheels      Prior Function Level of Independence: Independent with assistive device(s)               Hand Dominance        Extremity/Trunk Assessment   Upper Extremity Assessment: Defer to OT evaluation           Lower Extremity Assessment: Overall WFL for tasks assessed (partially related to pain)      Cervical / Trunk Assessment: Kyphotic  Communication   Communication: No difficulties  Cognition Arousal/Alertness: Awake/alert Behavior During Therapy: [facility]  for tasks assessed/performed Overall Cognitive Status: Within Functional Limits for tasks assessed                      General Comments      Exercises        Assessment/Plan    PT Assessment Patient needs continued PT services  PT Diagnosis Acute pain;Generalized weakness;Difficulty walking   PT Problem List Pain;Decreased  mobility;Decreased activity tolerance;Decreased range of motion;Decreased strength  PT Treatment Interventions DME instruction;Gait training;Stair training;Functional mobility training;Therapeutic activities;Therapeutic exercise;Patient/family education   PT Goals (Current goals can be found in the Care Plan section) Acute Rehab PT Goals Patient Stated Goal: To return to home from the hospital PT Goal Formulation: With patient Time For Goal Achievement: 01/23/16 Potential to Achieve Goals: Fair    Frequency Min 2X/week   Barriers to discharge Decreased caregiver support      Co-evaluation               End of Session Equipment Utilized During Treatment: Gait belt Activity Tolerance: Patient limited by pain Patient left: in chair;with call bell/phone within reach Nurse Communication: Mobility status         Time: [id number] PT Time Calculation (min) (ACUTE ONLY): 28 min   Charges:   PT Evaluation $PT Re-evaluation: 1 Procedure PT Treatments $Therapeutic Activity: 8-22 mins   PT G Codes:        Cassell Clement, PT, CSCS Pager [phone] Office [phone]  01/09/2016, 12:21 PM

## 2016-01-09 NOTE — Evaluation (Signed)
Occupational Therapy Evaluation Patient Details Name: Cynthia Hicks MRN: [id number] DOB: 1932/09/09 Today's Date: 01/09/2016    History of Present Illness 82 y.o. female with a diagnosis of Bilateral Knee Infection who failed conservative measures. Pt underwent bilateral irrigation and debridement on 01/08/16.PMH: hypertension, diabetes, CAD, CKD, sciatica, chronic back pain, CHF.    Clinical Impression   Pt was assisted to wash her back, avoided socks and wore slip on shoes prior to admission. She could prepare a simple meal and was able to ambulate with a 4WW. Pt presents with increased B knee pain, poor balance and baseline significant shoulder limitations interfering with ability to care for herself. Pt currently not able to ambulate and requires mod to max assist for mobility. Pt feels strongly that she wants to go home and she has been able to manage a PICC line in the past. Will follow acutely. At this time, recommending SNF, but will continuing working with pt acutely.    Follow Up Recommendations  SNF;Supervision/Assistance - 24 hour    Equipment Recommendations  None recommended by OT    Recommendations for Other Services       Precautions / Restrictions Precautions Precautions: Fall Restrictions Weight Bearing Restrictions: Yes RLE Weight Bearing: Weight bearing as tolerated LLE Weight Bearing: Weight bearing as tolerated      Mobility Bed Mobility      General bed mobility comments: pt in chair  Transfers Overall transfer level: Needs assistance Equipment used: 1 person hand held assist Transfers: Sit to/from Stand;Stand Pivot Transfers Sit to Stand: Mod assist Stand pivot transfers: Max assist;Mod assist       General transfer comment: max from low surface of recliner, mod from 3 in 1.    Balance Overall balance assessment: Needs assistance Sitting-balance support: Bilateral upper extremity supported Sitting balance-Leahy Scale: Fair Sitting balance -  Comments: Using UE support    Standing balance support: Bilateral upper extremity supported Standing balance-Leahy Scale: Poor Standing balance comment: using rw for support                            ADL Overall ADL's : Needs assistance/impaired Eating/Feeding: Minimal assistance;Sitting Eating/Feeding Details (indicate cue type and reason): assist to open containers Grooming: Wash/dry hands;Sitting;Set up   Upper Body Bathing: Minimal assitance;Sitting   Lower Body Bathing: Total assistance;Sit to/from stand   Upper Body Dressing : Moderate assistance;Sitting   Lower Body Dressing: Total assistance;Sit to/from stand   Toilet Transfer: Moderate assistance;Stand-pivot;BSC   Toileting- Clothing Manipulation and Hygiene: Total assistance;Sit to/from stand         General ADL Comments: Inability to tolerate weight bearing on B UEs.     Vision     Perception     Praxis      Pertinent Vitals/Pain Pain Assessment: Faces Faces Pain Scale: Hurts whole lot Pain Location: knees, R>L Pain Descriptors / Indicators: Aching Pain Intervention(s): Monitored during session;Limited activity within patient's tolerance;Premedicated before session;Repositioned     Hand Dominance Right   Extremity/Trunk Assessment Upper Extremity Assessment Upper Extremity Assessment: RUE deficits/detail;LUE deficits/detail RUE Deficits / Details: longstanding shoulder limitations, FF to 80 degrees, arthritic changes in hand RUE Coordination: decreased fine motor;decreased gross motor LUE Deficits / Details: shoulder limited to 45* FF and abd since February, pt with rotator cuff tear per MRI, arthritic changes in hand LUE Coordination: decreased fine motor;decreased gross motor   Lower Extremity Assessment Lower Extremity Assessment: Defer to PT evaluation  Cervical / Trunk Assessment Cervical / Trunk Assessment: Kyphotic (hx of back surgeries and chronic back pain)   Communication  Communication Communication: No difficulties   Cognition Arousal/Alertness: Awake/alert Behavior During Therapy: WFL for tasks assessed/performed Overall Cognitive Status: Within Functional Limits for tasks assessed                     General Comments       Exercises       Shoulder Instructions      Home Living Family/patient expects to be discharged to:: Private residence Living Arrangements: Non-relatives/Friends Available Help at Discharge: Friend(s);Available 24 hours/day Type of Home: House Home Access: Stairs to enter [employer] of Steps: 3 Entrance Stairs-Rails: Right Home Layout: One level     Bathroom Shower/Tub: Walk-in shower;Door   [employer] Toilet: Programmer, systems: Yes How Accessible: Accessible via walker Home Equipment: Adaptive equipment Adaptive Equipment: Long-handled sponge Additional Comments: avoids socks, wears slip on shoes      Prior Functioning/Environment Level of Independence: Needs assistance  Gait / Transfers Assistance Needed: ambulated with a 4WW ADL's / Homemaking Assistance Needed: friend helped wash her back and got things out of cabinets for her, pt performed meal prep and light housekeeping, sat on seat to showeri        OT Diagnosis: Generalized weakness;Acute pain   OT Problem List: Decreased strength;Decreased coordination;Impaired balance (sitting and/or standing);Decreased activity tolerance;Decreased range of motion;Impaired UE functional use;Pain;Increased edema   OT Treatment/Interventions: Self-care/ADL training;DME and/or AE instruction;Balance training;Patient/family education;Therapeutic activities    OT Goals(Current goals can be found in the care plan section) Acute Rehab OT Goals Patient Stated Goal: To return to home from the hospital OT Goal Formulation: With patient Time For Goal Achievement: 01/23/16 Potential to Achieve Goals: Good ADL Goals Pt Will Perform Grooming:  with min guard assist;standing Pt Will Perform Lower Body Bathing: with min guard assist;sit to/from stand;with adaptive equipment Pt Will Perform Lower Body Dressing: with min guard assist;sit to/from stand;with adaptive equipment Pt Will Transfer to Toilet: with min guard assist;ambulating;bedside commode (over toilet) Pt Will Perform Toileting - Clothing Manipulation and hygiene: with min guard assist;sit to/from stand  OT Frequency: Min 2X/week   Barriers to D/C: Decreased caregiver support (can provide minimal physical assist)          Co-evaluation              End of Session Equipment Utilized During Treatment: Gait belt  Activity Tolerance: Patient limited by pain Patient left: in chair;with call bell/phone within reach;with chair alarm set   Time: 1200-1225 OT Time Calculation (min): 25 min Charges:  OT General Charges $OT Visit: 1 Procedure OT Evaluation $OT Eval Moderate Complexity: 1 Procedure OT Treatments $Self Care/Home Management : 8-22 mins G-Codes:    Malka So 01/09/2016, 2:07 PM  [phone]

## 2016-01-09 NOTE — Progress Notes (Addendum)
Subjective:  Pt states that her left leg pain has nearly resolved, but her right knee pain hurts as bad as when she came in. She still doesn't think she can walk on it. She denies any fevers.   Objective: Vital signs in last 24 hours: Filed Vitals:   01/08/16 1753 01/08/16 2253 01/09/16 0540 01/09/16 0852  BP: 134/73 132/51 154/64 156/79  Pulse: 73 94 89 92  Temp: 97.8 F (36.6 C) 98.3 F (36.8 C) 97.6 F (36.4 C)   TempSrc:      Resp: _0 Height:      Weight:      SpO2: 100% 99% 99%    Weight change:   Intake/Output Summary (Last 24 hours) at 01/09/16 0950 Last data filed at 01/08/16 2037  Gross per 24 hour  Intake    385 ml  Output    605 ml  Net   -220 ml   Physical Exam. General: Lying in bed, not diaphoretic, NAD HEENT: MMM, Neck Supple Cardiovascular: RRR, no m/r/g Pulmonary: Clear to auscultation in anterior fields and bases. Unlabored breathing Abdominal: Soft NT/ND. Normal BS. Extremities and Skin: Bilateral knee drains in place, draining serosanguinous fluid. Erythematous lower extremities with 1+ pitting edema. Lab Results: Basic Metabolic Panel:  Recent Labs Lab 01/07/16 0537 01/07/16 1543 01/08/16 0515  NA 142  --  144  K 4.6  --  4.7  CL 102  --  108  CO2 30  --  28  GLUCOSE 157*  --  123*  BUN 39*  --  32*  CREATININE 1.14*  --  1.18*  CALCIUM 8.5*  --  8.6*  MG  --  2.2  --    CBC:  Recent Labs Lab 01/07/16 0537 01/08/16 0515  WBC 19.9* 16.6*  HGB 10.2* 9.1*  HCT 34.1* 31.6*  MCV 93.4 94.3  PLT 200 184   CBG:  Recent Labs Lab 01/08/16 1139 01/08/16 1428 01/08/16 1542 01/08/16 1748 01/08/16 2252 01/09/16 0756  GLUCAP 312* 180* 155* 97 106* 171*    Micro Results: Recent Results (from the past 240 hour(s))  Blood Culture (routine x 2)     Status: None (Preliminary result)   Collection Time: 01/06/16  3:00 PM  Result Value Ref Range Status   Specimen Description BLOOD RIGHT ANTECUBITAL  Final   Special  Requests   Final    BOTTLES DRAWN AEROBIC AND ANAEROBIC BLUE 10CC RED 5CC   Culture NO GROWTH 2 DAYS  Final   Report Status PENDING  Incomplete  Blood Culture (routine x 2)     Status: None (Preliminary result)   Collection Time: 01/06/16  3:10 PM  Result Value Ref Range Status   Specimen Description BLOOD LEFT ANTECUBITAL  Final   Special Requests BOTTLES DRAWN AEROBIC ONLY 10CC  Final   Culture NO GROWTH 2 DAYS  Final   Report Status PENDING  Incomplete  Urine culture     Status: Abnormal   Collection Time: 01/06/16  9:41 PM  Result Value Ref Range Status   Specimen Description URINE, CLEAN CATCH  Final   Special Requests NONE  Final   Culture MULTIPLE SPECIES PRESENT, SUGGEST RECOLLECTION (A)  Final   Report Status 01/07/2016 FINAL  Final  Gram stain     Status: None   Collection Time: 01/07/16  6:05 PM  Result Value Ref Range Status   Specimen Description FLUID KNEE RIGHT  Final   Special Requests NONE  Final  Gram Stain   Final    ABUNDANT WBC PRESENT,BOTH PMN AND MONONUCLEAR NO ORGANISMS SEEN GRAM STAIN REVIEWED-AGREE WITH RESULT WILKINS,V    Report Status 01/07/2016 FINAL  Final  Anaerobic culture     Status: None (Preliminary result)   Collection Time: 01/07/16  6:07 PM  Result Value Ref Range Status   Specimen Description FLUID KNEE RIGHT  Final   Special Requests NONE  Final   Gram Stain   Final    ABUNDANT WBC PRESENT,BOTH PMN AND MONONUCLEAR NO ORGANISMS SEEN    Culture   Final    NO ANAEROBES ISOLATED; CULTURE IN PROGRESS FOR 5 DAYS   Report Status PENDING  Incomplete  Body fluid culture     Status: None (Preliminary result)   Collection Time: 01/07/16  6:13 PM  Result Value Ref Range Status   Specimen Description FLUID KNEE RIGHT  Final   Special Requests NONE  Final   Gram Stain   Final    ABUNDANT WBC PRESENT,BOTH PMN AND MONONUCLEAR NO ORGANISMS SEEN    Culture   Final    FEW GRAM NEGATIVE RODS IDENTIFICATION AND SUSCEPTIBILITIES TO  FOLLOW CRITICAL RESULT CALLED TO, READ BACK BY AND VERIFIED WITH: Markham Jordan AT 7425 01/08/16 BY L BENFIELD    Report Status PENDING  Incomplete  Body fluid culture     Status: None (Preliminary result)   Collection Time: 01/07/16  6:19 PM  Result Value Ref Range Status   Specimen Description FLUID KNEE LEFT  Final   Special Requests NONE  Final   Gram Stain   Final    FEW WBC PRESENT, PREDOMINANTLY MONONUCLEAR NO ORGANISMS SEEN    Culture   Final    FEW GRAM NEGATIVE RODS CULTURE REINCUBATED FOR BETTER GROWTH CRITICAL RESULT CALLED TO, READ BACK BY AND VERIFIED WITH: Markham Jordan AT 9563 01/08/16 BY L BENFIELD    Report Status PENDING  Incomplete  Gram stain     Status: None   Collection Time: 01/07/16  6:19 PM  Result Value Ref Range Status   Specimen Description FLUID KNEE LEFT  Final   Special Requests NONE  Final   Gram Stain   Final    FEW WBC PRESENT, PREDOMINANTLY MONONUCLEAR NO ORGANISMS SEEN GRAM STAIN REVIEWED-AGREE WITH RESULT WILKINS,V    Report Status 01/07/2016 FINAL  Final   Studies/Results: Dg Knee Complete 4 Views Left  01/07/2016  CLINICAL DATA:  Bilateral knee pain and swelling for 2 weeks with redness, right worse than left. EXAM: LEFT KNEE - COMPLETE 4+ VIEW COMPARISON:  Left tibia/ fibula radiographs 12/16/2015 FINDINGS: No acute fracture, dislocation, or or sizable knee joint effusion is identified. There is mild medial compartment joint space narrowing. Geographic, sclerotic intramedullary lesions are again seen involving the distal femur and proximal tibia and are most consistent with bone infarcts. Vascular calcifications are noted. Mild nonspecific soft tissue thickening anterior to the knee. IMPRESSION: No acute osseous abnormality identified. 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 Electronically Signed   By: Logan Bores M.D.   On: 01/07/2016 14:28   Dg Knee Complete 4 Views Right  01/07/2016  CLINICAL DATA:  Bilateral knee pain and swelling  for 2 weeks, right worse than left. Skin redness. EXAM: RIGHT KNEE - COMPLETE 4+ VIEW COMPARISON:  Right tibia/ fibula radiographs 12/16/2015. Right knee radiographs 09/12/2012. FINDINGS: No acute fracture, dislocation, or sizable knee joint effusion is identified. There is mild diffuse soft tissue thickening along the anterior aspect of the knee.  There is at most mild medial compartment joint space narrowing. The bones are osteopenic. Vascular calcifications are noted. IMPRESSION: Anterior knee soft tissue thickening, nonspecific. No acute osseous abnormality identified. Electronically Signed   By: Logan Bores M.D.   On: 01/07/2016 14:25   Dg Fluoro Guided Needle Plc Aspiration/injection Loc  01/07/2016  Logan Bores, MD     01/07/2016  4:31 PM Bilateral knee joint aspirations performed. Only trace (1 mL or less) of serosanguinous fluid obtained from each knee with initial aspiration. 10 mL sterile saline then injected into each knee, reaspirated, and sent separately from initial aspirates.  Medications: I have reviewed the patient's current medications. Scheduled Meds: . apixaban  2.5 mg Oral BID  . bisoprolol  5 mg Oral Daily  . cefTRIAXone (ROCEPHIN)  IV  2 g Intravenous Q24H  . cholecalciferol  2,000 Units Oral QHS  . diclofenac sodium  2 g Topical QID  . dofetilide  125 mcg Oral BID  . feeding supplement (GLUCERNA SHAKE)  237 mL Oral BID BM  . furosemide  40 mg Oral q morning - 10a   And  . furosemide  20 mg Oral Daily  . gabapentin  100 mg Oral TID  . insulin aspart  0-20 Units Subcutaneous TID WC  . losartan  25 mg Oral Daily  . morphine  60 mg Oral Q8H  . predniSONE  10 mg Oral Q breakfast   Continuous Infusions: . sodium chloride 10 mL/hr (01/08/16 1730)   PRN Meds:.acetaminophen **OR** acetaminophen, methocarbamol **OR** methocarbamol (ROBAXIN)  IV, metoCLOPramide **OR** metoCLOPramide (REGLAN) injection, ondansetron **OR** ondansetron (ZOFRAN) IV,  oxyCODONE Assessment/Plan:  Septic Arthritis: Few gram negative rods growing from both knees. Knee washout on 6/4. It is possible that previous antibiotics treatment may have somewhat sterilized the joint. ID has been consulted for antibiotic recommendations, will likely need PICC placement. Also investigating autoimmune etiologies, HLA-B27 pending. PT to make recommendations on placement. - Discontinue vancomycin - Ceftriaxone IV per pharm - Blood cultures 01/06/16 -->NTD - PT consulted - checking HLA-B27 antigen  Lymphedema: Uses a venous pump at home, has not used since d/c from [facility]. Leads to erythematous changes seen on exam in bilateral lower extremities - Furosemide 40 mg daily  Type 2 DM:  - Resistant SSI  PAF (CHADS-VASc 6): Patient not currently in atrial fibrillation.  - Dofetilide 125 mcg BID - Bisoprolol 5 mg daily - Restart eliquis 2.5 mg BID  HTN: Stable. 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 - Prednisone 10 mg daily  Chronic Back Pain: On chronic pain regimen. Recently switched from methadone. - Morphine 60 mg 12 hr q8h prn - Oxycodone 5 mg BID prn  DVT ppx: Eliqus  Dispo: Disposition is deferred at this time, awaiting improvement of current medical problems.    The patient does have a current PCP (Imran Gertie Baron, MD) and does not need an [facility] hospital follow-up appointment after discharge.  The patient does have transportation limitations that hinder transportation to clinic appointments.  .Services Needed at time of discharge: Y = Yes, Blank = No PT:   OT:   RN:   Equipment:   Other:     LOS: 2 days   Liberty Handy, MD 01/09/2016, 9:50 AM

## 2016-01-10 DIAGNOSIS — L89159 Pressure ulcer of sacral region, unspecified stage: Secondary | ICD-10-CM

## 2016-01-10 DIAGNOSIS — N183 Chronic kidney disease, stage 3 (moderate): Secondary | ICD-10-CM

## 2016-01-10 DIAGNOSIS — E46 Unspecified protein-calorie malnutrition: Secondary | ICD-10-CM

## 2016-01-10 DIAGNOSIS — I129 Hypertensive chronic kidney disease with stage 1 through stage 4 chronic kidney disease, or unspecified chronic kidney disease: Secondary | ICD-10-CM

## 2016-01-10 DIAGNOSIS — E1122 Type 2 diabetes mellitus with diabetic chronic kidney disease: Secondary | ICD-10-CM

## 2016-01-10 DIAGNOSIS — B965 Pseudomonas (aeruginosa) (mallei) (pseudomallei) as the cause of diseases classified elsewhere: Secondary | ICD-10-CM

## 2016-01-10 LAB — CBC
HEMATOCRIT: 34.2 % — AB (ref 36.0–46.0)
Hemoglobin: 10.1 g/dL — ABNORMAL LOW (ref 12.0–15.0)
MCH: 27.5 pg (ref 26.0–34.0)
MCHC: 29.5 g/dL — ABNORMAL LOW (ref 30.0–36.0)
MCV: 93.2 fL (ref 78.0–100.0)
Platelets: 200 10*3/uL (ref 150–400)
RBC: 3.67 MIL/uL — AB (ref 3.87–5.11)
RDW: 21.1 % — ABNORMAL HIGH (ref 11.5–15.5)
WBC: 16 10*3/uL — AB (ref 4.0–10.5)

## 2016-01-10 LAB — BODY FLUID CULTURE

## 2016-01-10 LAB — GLUCOSE, CAPILLARY
GLUCOSE-CAPILLARY: 391 mg/dL — AB (ref 65–99)
Glucose-Capillary: 150 mg/dL — ABNORMAL HIGH (ref 65–99)
Glucose-Capillary: 94 mg/dL (ref 65–99)

## 2016-01-10 MED ORDER — LEVOFLOXACIN 750 MG PO TABS: 750.0000 mg | ORAL_TABLET | ORAL | Status: AC

## 2016-01-10 MED ORDER — MORPHINE SULFATE ER 60 MG PO TBCR: 60.0000 mg | EXTENDED_RELEASE_TABLET | Freq: Three times a day (TID) | ORAL | Status: DC

## 2016-01-10 MED ORDER — ACETAMINOPHEN 325 MG PO TABS: 650.0000 mg | ORAL_TABLET | Freq: Four times a day (QID) | ORAL | Status: AC | PRN

## 2016-01-10 MED ORDER — GLUCERNA SHAKE PO LIQD: 237.0000 mL | Freq: Two times a day (BID) | ORAL | Status: DC

## 2016-01-10 MED ORDER — PIPERACILLIN-TAZOBACTAM 3.375 G IVPB
3.3750 g | Freq: Three times a day (TID) | INTRAVENOUS | Status: DC
  Administered 2016-01-10: 3.375 g via INTRAVENOUS
  Filled 2016-01-10 (×2): qty 50

## 2016-01-10 MED ORDER — DICLOFENAC SODIUM 1 % TD GEL: 2.0000 g | Freq: Four times a day (QID) | TRANSDERMAL | Status: DC

## 2016-01-10 MED ORDER — LEVOFLOXACIN 750 MG PO TABS
750.0000 mg | ORAL_TABLET | ORAL | Status: DC
  Administered 2016-01-10: 750 mg via ORAL
  Filled 2016-01-10: qty 1

## 2016-01-10 MED ORDER — OXYCODONE HCL 5 MG PO TABS: 5.0000 mg | ORAL_TABLET | Freq: Two times a day (BID) | ORAL | Status: DC | PRN

## 2016-01-10 NOTE — Progress Notes (Signed)
Pharmacy Antibiotic Note  Cynthia Hicks is a 82 y.o. female admitted on 01/06/2016 with septic arthritis.  Pharmacy has been consulted for Zosyn dosing, adjusting antibiotic regimen for (+)Pseudomonas in both L & R synovial fluid cultures.  Note this is also sensitive to Cipro, but pt is also receiving Dofetilide.  CrCl 25-30  Plan: Zosyn 3.375g IV q8, infuse over 4hr F/U culture results Watch renal fxn.  Height: 5\' 1"  (154.9 cm) Weight: 107 lb (48.535 kg) IBW/kg (Calculated) : 47.8  Temp (24hrs), Avg:98.1 F (36.7 C), Min:97.8 F (36.6 C), Max:98.4 F (36.9 C)   Recent Labs Lab 01/06/16 1345 01/06/16 1520 01/07/16 0537 01/08/16 0515 01/10/16 0646  WBC 24.6*  --  19.9* 16.6* 16.0*  CREATININE 1.26*  --  1.14* 1.18*  --   LATICACIDVEN  --  2.12*  --   --   --     Estimated Creatinine Clearance: 28.7 mL/min (by C-G formula based on Cr of 1.18).    Allergies  Allergen Reactions  . Cyclosporine     Blasted bone marrow  . Azathioprine     Other reaction(s): Unknown  . Mycophenolate Other (See Comments)    Pre-op note mentions "intolerance" to mycophenolate.  Unknown reaction, unknown severity.  . Other     Other reaction(s): Other (See Comments) Uncoded Allergy. Allergen: Other Allergy: See Patient Chart for Details, Other Reaction: Toxicity  . Oysters [Shellfish Allergy] Nausea And Vomiting    Antimicrobials this admission:  Vanc 6/2>> 6/5  Zosyn 6/2>>6/4  Ceftriaxone 6/4 >> 6/6  Zosyn 6/6 >>   Microbiology results:  6/2 BCx: ngtd  6/2 UCx: multiple speccies/recollect  6/3 R knee fluid: few Pseudomonas S: pan-sensitive  6/3 L knee fluid: few Pseudomonas S: pan-sensitive   Thank you for allowing pharmacy to be a part of this patient's care.  Gracy Bruins, PharmD Clinical Pharmacist [facility]

## 2016-01-10 NOTE — Clinical Social Work Placement (Signed)
   CLINICAL SOCIAL WORK PLACEMENT  NOTE  Date:  01/10/2016  Patient Details  Name: Cynthia Hicks MRN: [id number] Date of Birth: January 30, 1933  Clinical Social Work is seeking post-discharge placement for this patient at the [place] level of care (*CSW will initial, date and re-position this form in  chart as items are completed):  Yes   Patient/family provided with [place] Work Department's list of facilities offering this level of care within the geographic area requested by the patient (or if unable, by the patient's family).  Yes   Patient/family informed of their freedom to choose among providers that offer the needed level of care, that participate in Medicare, Medicaid or managed care program needed by the patient, have an available bed and are willing to accept the patient.  Yes   Patient/family informed of [place]'s ownership interest in [facility] and [facility], as well as of the fact that they are under no obligation to receive care at these facilities.  PASRR submitted to EDS on       PASRR number received on       Existing PASRR number confirmed on 01/09/16     FL2 transmitted to all facilities in geographic area requested by pt/family on 01/09/16     FL2 transmitted to all facilities within larger geographic area on       Patient informed that his/her managed care company has contracts with or will negotiate with certain facilities, including the following:        Yes   Patient/family informed of bed offers received.  Patient chooses bed at [facility]     Physician recommends and patient chooses bed at      Patient to be transferred to [facility] on 01/10/16.  Patient to be transferred to facility by [facility]     Patient family notified on 01/10/16 of transfer.  Name of family member notified:  pt to notfy famliy     PHYSICIAN Please sign FL2     Additional Comment:     _______________________________________________ Cranford Mon, LCSW 01/10/2016, 1:45 PM

## 2016-01-10 NOTE — Progress Notes (Signed)
NURSING PROGRESS NOTE  Cynthia Hicks [id number] Discharge Data: 01/10/2016 2:19 PM Attending Provider: Sid Falcon, MD [id number], Cynthia Charters, MD     Cynthia Hicks to be D/C'd Skilled nursing facility per MD order.  Discussed with the patient the After Visit Summary and all questions fully answered. All IV's discontinued with no bleeding noted. All belongings returned to patient for patient to take home.   Last Vital Signs:  Blood pressure 125/61, pulse 100, temperature 98.5 F (36.9 C), temperature source Oral, resp. rate 18, height 5\' 1"  (1.549 m), weight 48.535 kg (107 lb), SpO2 98 %.  Discharge Medication List   Medication List    STOP taking these medications        alendronate 70 MG tablet  Commonly known as:  FOSAMAX      TAKE these medications        acetaminophen 325 MG tablet  Commonly known as:  TYLENOL  Take 2 tablets (650 mg total) by mouth every 6 (six) hours as needed for mild pain (or Fever >/= 101).     apixaban 2.5 MG Tabs tablet  Commonly known as:  ELIQUIS  Take 1 tablet (2.5 mg total) by mouth 2 (two) times daily.     bisoprolol 5 MG tablet  Commonly known as:  ZEBETA  Take 1 tablet (5 mg total) by mouth daily.     diclofenac sodium 1 % Gel  Commonly known as:  VOLTAREN  Apply 2 g topically 4 (four) times daily.     dofetilide 125 MCG capsule  Commonly known as:  TIKOSYN  Take 1 capsule (125 mcg total) by mouth 2 (two) times daily.     ezetimibe 10 MG tablet  Commonly known as:  ZETIA  Take 10 mg by mouth daily.     feeding supplement (GLUCERNA SHAKE) Liqd  Take 237 mLs by mouth 2 (two) times daily between meals.     FISH OIL + D3 PO  Take 1 capsule by mouth daily.     furosemide 40 MG tablet  Commonly known as:  LASIX  Take 20-40 mg by mouth 2 (two) times daily. 40 mg in the morning and 20 mg in the afternoon     gabapentin 100 MG capsule  Commonly known as:  NEURONTIN  Take 100 mg by mouth 3 (three) times daily.     levofloxacin 750  MG tablet  Commonly known as:  LEVAQUIN  Take 1 tablet (750 mg total) by mouth every other day. Last dose on 01/31/16.     losartan 25 MG tablet  Commonly known as:  COZAAR  TAKE 1 TABLET BY MOUTH EVERY DAY     magnesium oxide 400 (241.3 Mg) MG tablet  Commonly known as:  MAG-OX  Take 1 tablet (400 mg total) by mouth daily.     metolazone 2.5 MG tablet  Commonly known as:  ZAROXOLYN  Take 2.5 mg by mouth every 14 (fourteen) days. Patient takes every 2 weeks.     morphine 60 MG 12 hr tablet  Commonly known as:  MS CONTIN  Take 1 tablet (60 mg total) by mouth every 8 (eight) hours.     nitroGLYCERIN 0.4 MG SL tablet  Commonly known as:  NITROSTAT  Place 1 tablet (0.4 mg total) under the tongue every 5 (five) minutes as needed for chest pain.     omeprazole 20 MG capsule  Commonly known as:  PRILOSEC  Take 20 mg by mouth daily.  oxyCODONE 5 MG immediate release tablet  Commonly known as:  Oxy IR/ROXICODONE  Take 1 tablet (5 mg total) by mouth 2 (two) times daily as needed for moderate pain.     polyvinyl alcohol 1.4 % ophthalmic solution  Commonly known as:  LIQUIFILM TEARS  Place 1 drop into both eyes daily as needed for dry eyes.     pravastatin 80 MG tablet  Commonly known as:  PRAVACHOL  Take 1 tablet (80 mg total) by mouth every evening.     predniSONE 10 MG tablet  Commonly known as:  DELTASONE  Take 30 mg by mouth daily. Patient started on a 70 mg dose taper on 12-31-15     RELION R IJ  Inject 8-15 Units as directed 3 (three) times daily. Takes 8 units at breakfast and lunch. Takes 12-15 units at supper per sliding scale.     vitamin C 1000 MG tablet  Take 1,000 mg by mouth 2 (two) times daily.     VITAMIN D PO  Take 2,000 Units by mouth at bedtime.         Doristine Devoid, RN

## 2016-01-10 NOTE — Progress Notes (Signed)
Occupational Therapy Treatment Patient Details Name: Cynthia Hicks MRN: [id number] DOB: 02/01/1933 Today's Date: 01/10/2016    History of present illness 82 y.o. female with a diagnosis of Bilateral Knee Infection who failed conservative measures. Pt underwent bilateral irrigation and debridement on 01/08/16.PMH: hypertension, diabetes, CAD, CKD, sciatica, chronic back pain, CHF.    OT comments  Pt progressing. Education provided in session. Pt planning to go to SNF today.   Follow Up Recommendations  SNF;Supervision - Intermittent    Equipment Recommendations  None recommended by OT    Recommendations for Other Services      Precautions / Restrictions Precautions Precautions: Fall Restrictions Weight Bearing Restrictions: Yes RLE Weight Bearing: Weight bearing as tolerated LLE Weight Bearing: Weight bearing as tolerated       Mobility Bed Mobility Overal bed mobility: Needs Assistance Bed Mobility: Supine to Sit     Supine to sit: Supervision        Transfers Overall transfer level: Needs assistance   Transfers: Sit to/from Stand;Stand Pivot Transfers Sit to Stand: Mod assist Stand pivot transfers: Min assist       General transfer comment: Heavier assist to stand from bed. Pt did better standing from 3 in 1.     Balance      Assist for transfers                             ADL Overall ADL's : Needs assistance/impaired     Grooming: Wash/dry hands;Wash/dry face;Sitting;Set up (applied sanitizer to hands)                   Toilet Transfer: Moderate assistance;Stand-pivot;BSC (Mod A-sit to stand from bed; Min A-stand pivot)   Toileting- Clothing Manipulation and Hygiene: Minimal assistance (sitting/standing)       Functional mobility during ADLs: Moderate assistance (Min-Mod A) General ADL Comments: Educated on LB dressing technique. Discussed AE. Discussed alternative ways to manage LB ADLs (ex. leaning).      Vision                      Perception     Praxis      Cognition  Awake/Alert Behavior During Therapy: WFL for tasks assessed/performed Overall Cognitive Status: Within Functional Limits for tasks assessed                       Extremity/Trunk Assessment               Exercises     Shoulder Instructions       General Comments      Pertinent Vitals/ Pain       Pain Assessment: 0-10 Pain Score:  (6-7) Pain Location: bilateral legs Pain Descriptors / Indicators: Aching;Dull;Throbbing;Moaning Pain Intervention(s): Monitored during session;Repositioned  Home Living                                          Prior Functioning/Environment              Frequency Min 2X/week     Progress Toward Goals  OT Goals(current goals can now be found in the care plan section)  Progress towards OT goals: Progressing toward goals  Acute Rehab OT Goals Patient Stated Goal: be able to walk with my walker OT Goal Formulation: With patient Time  For Goal Achievement: 01/23/16 Potential to Achieve Goals: Good ADL Goals Pt Will Perform Grooming: with min guard assist;standing Pt Will Perform Lower Body Bathing: with min guard assist;sit to/from stand;with adaptive equipment Pt Will Perform Lower Body Dressing: with min guard assist;sit to/from stand;with adaptive equipment Pt Will Transfer to Toilet: with min guard assist;ambulating;bedside commode (over toilet) Pt Will Perform Toileting - Clothing Manipulation and hygiene: with min guard assist;sit to/from stand  Plan Discharge plan needs to be updated    Co-evaluation                 End of Session Equipment Utilized During Treatment: Gait belt   Activity Tolerance Patient tolerated treatment well   Patient Left in chair;with call bell/phone within reach;with chair alarm set   Nurse Communication          Time: [id number] OT Time Calculation (min): 14 min  Charges: OT General  Charges $OT Visit: 1 Procedure OT Treatments $Self Care/Home Management : 8-22 mins  Benito Mccreedy OTR/L [id number] 01/10/2016, 1:22 PM

## 2016-01-10 NOTE — Progress Notes (Signed)
Patient will discharge to [facility] Anticipated discharge date:6/6 Family notified: pt will notify when she gets to SNF Transportation by PTAR-called at 1:40pm  CSW signing off.  Domenica Reamer, [place] Social Worker [phone]

## 2016-01-10 NOTE — Discharge Instructions (Signed)
Cynthia Hicks,  It was a pleasure taking care of you in the hospital.  You will take an antibiotic Levaquin every other day until June 27th. This will treat an infection in your knee joints. Please seek medical attention if the pain worsens or you develop fevers.  Best of luck on your recovery at your rehab facility. Remember, after you achieve your treatment goals of being able to walk, you can request to be discharged.  Have a great day!

## 2016-01-10 NOTE — Progress Notes (Addendum)
Subjective:  Pt states that her right knee is starting to feel better this morning, but she endorses some pain since her MS contin was given later than usual. She was able able to go back and forth to the bedside commode with assistance  Objective: Vital signs in last 24 hours: Filed Vitals:   01/09/16 0540 01/09/16 0852 01/09/16 1411 01/10/16 0636  BP: 154/64 156/79 140/53 159/99  Pulse: 89 92 70 110  Temp: 97.6 F (36.4 C)  98.4 F (36.9 C) 97.8 F (36.6 C)  TempSrc:   Oral Oral  Resp: _0 Height:      Weight:      SpO2: 99%  99% 100%   Weight change:   Intake/Output Summary (Last 24 hours) at 01/10/16 0834 Last data filed at 01/09/16 2326  Gross per 24 hour  Intake 284.33 ml  Output    500 ml  Net -215.67 ml   Physical Exam. General: Lying in bed, not diaphoretic, NAD HEENT: MMM, Neck Supple Cardiovascular: RRR, no m/r/g Pulmonary: Clear to auscultation in anterior fields and bases. Unlabored breathing Abdominal: Soft NT/ND. Normal BS. Extremities and Skin: Bilateral knee drains removed. Erythematous lower extremities with 1+ pitting edema. Lab Results: Basic Metabolic Panel:  Recent Labs Lab 01/07/16 0537 01/07/16 1543 01/08/16 0515  NA 142  --  144  K 4.6  --  4.7  CL 102  --  108  CO2 30  --  28  GLUCOSE 157*  --  123*  BUN 39*  --  32*  CREATININE 1.14*  --  1.18*  CALCIUM 8.5*  --  8.6*  MG  --  2.2  --    CBC:  Recent Labs Lab 01/08/16 0515 01/10/16 0646  WBC 16.6* 16.0*  HGB 9.1* 10.1*  HCT 31.6* 34.2*  MCV 94.3 93.2  PLT 184 200   CBG:  Recent Labs Lab 01/08/16 2252 01/09/16 0756 01/09/16 1156 01/09/16 1721 01/10/16 0048 01/10/16 0807  GLUCAP 106* 171* 305* 177* 94 150*    Micro Results: Recent Results (from the past 240 hour(s))  Blood Culture (routine x 2)     Status: None (Preliminary result)   Collection Time: 01/06/16  3:00 PM  Result Value Ref Range Status   Specimen Description BLOOD RIGHT ANTECUBITAL   Final   Special Requests   Final    BOTTLES DRAWN AEROBIC AND ANAEROBIC BLUE 10CC RED 5CC   Culture NO GROWTH 3 DAYS  Final   Report Status PENDING  Incomplete  Blood Culture (routine x 2)     Status: None (Preliminary result)   Collection Time: 01/06/16  3:10 PM  Result Value Ref Range Status   Specimen Description BLOOD LEFT ANTECUBITAL  Final   Special Requests BOTTLES DRAWN AEROBIC ONLY 10CC  Final   Culture NO GROWTH 3 DAYS  Final   Report Status PENDING  Incomplete  Urine culture     Status: Abnormal   Collection Time: 01/06/16  9:41 PM  Result Value Ref Range Status   Specimen Description URINE, CLEAN CATCH  Final   Special Requests NONE  Final   Culture MULTIPLE SPECIES PRESENT, SUGGEST RECOLLECTION (A)  Final   Report Status 01/07/2016 FINAL  Final  Gram stain     Status: None   Collection Time: 01/07/16  6:05 PM  Result Value Ref Range Status   Specimen Description FLUID KNEE RIGHT  Final   Special Requests NONE  Final   Gram Stain  Final    ABUNDANT WBC PRESENT,BOTH PMN AND MONONUCLEAR NO ORGANISMS SEEN GRAM STAIN REVIEWED-AGREE WITH RESULT WILKINS,V    Report Status 01/07/2016 FINAL  Final  Anaerobic culture     Status: None (Preliminary result)   Collection Time: 01/07/16  6:07 PM  Result Value Ref Range Status   Specimen Description FLUID KNEE RIGHT  Final   Special Requests NONE  Final   Gram Stain   Final    ABUNDANT WBC PRESENT,BOTH PMN AND MONONUCLEAR NO ORGANISMS SEEN    Culture   Final    NO ANAEROBES ISOLATED; CULTURE IN PROGRESS FOR 5 DAYS   Report Status PENDING  Incomplete  Body fluid culture     Status: None (Preliminary result)   Collection Time: 01/07/16  6:13 PM  Result Value Ref Range Status   Specimen Description FLUID KNEE RIGHT  Final   Special Requests NONE  Final   Gram Stain   Final    ABUNDANT WBC PRESENT,BOTH PMN AND MONONUCLEAR NO ORGANISMS SEEN    Culture   Final    FEW PSEUDOMONAS AERUGINOSA CRITICAL RESULT CALLED TO, READ  BACK BY AND VERIFIED WITH: Markham Jordan AT 2841 01/08/16 BY L BENFIELD    Report Status PENDING  Incomplete   Organism ID, Bacteria PSEUDOMONAS AERUGINOSA  Final      Susceptibility   Pseudomonas aeruginosa - MIC*    CEFTAZIDIME 4 SENSITIVE Sensitive     CIPROFLOXACIN <=0.25 SENSITIVE Sensitive     GENTAMICIN <=1 SENSITIVE Sensitive     IMIPENEM 1 SENSITIVE Sensitive     PIP/TAZO 8 SENSITIVE Sensitive     CEFEPIME 2 SENSITIVE Sensitive     * FEW PSEUDOMONAS AERUGINOSA  Body fluid culture     Status: None (Preliminary result)   Collection Time: 01/07/16  6:19 PM  Result Value Ref Range Status   Specimen Description FLUID KNEE LEFT  Final   Special Requests NONE  Final   Gram Stain   Final    FEW WBC PRESENT, PREDOMINANTLY MONONUCLEAR NO ORGANISMS SEEN    Culture   Final    FEW GRAM NEGATIVE RODS CULTURE REINCUBATED FOR BETTER GROWTH CRITICAL RESULT CALLED TO, READ BACK BY AND VERIFIED WITH: Markham Jordan AT 3244 01/08/16 BY L BENFIELD    Report Status PENDING  Incomplete  Gram stain     Status: None   Collection Time: 01/07/16  6:19 PM  Result Value Ref Range Status   Specimen Description FLUID KNEE LEFT  Final   Special Requests NONE  Final   Gram Stain   Final    FEW WBC PRESENT, PREDOMINANTLY MONONUCLEAR NO ORGANISMS SEEN GRAM STAIN REVIEWED-AGREE WITH RESULT WILKINS,V    Report Status 01/07/2016 FINAL  Final   Studies/Results: No results found. Medications: I have reviewed the patient's current medications. Scheduled Meds: . apixaban  2.5 mg Oral BID  . bisoprolol  5 mg Oral Daily  . cefTRIAXone (ROCEPHIN)  IV  2 g Intravenous Q24H  . cholecalciferol  2,000 Units Oral QHS  . diclofenac sodium  2 g Topical QID  . dofetilide  125 mcg Oral BID  . feeding supplement (GLUCERNA SHAKE)  237 mL Oral BID BM  . furosemide  40 mg Oral q morning - 10a   And  . furosemide  20 mg Oral Daily  . gabapentin  100 mg Oral TID  . insulin aspart  0-20 Units Subcutaneous TID WC  .  losartan  25 mg Oral Daily  . morphine  60 mg Oral Q8H  . pantoprazole  20 mg Oral Daily  . predniSONE  10 mg Oral Q breakfast   Continuous Infusions: . 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 Knee washout on 6/4. She can likely forego PICC placement given sensitivity to fluoroquinolones. Also investigating autoimmune etiologies, HLA-B27 pending. PT recommends SNF, and patient is agreeable if she isn't "forced" to stay 6 weeks. I confirmed with the social worker that she does not have to stay for 6 weeks if she achieves her treatment goals, which I relayed to the patient. - Levaquin 750 mg q48 hr (given CrCl <49) for 21 day course (last dose on 6/27) - Blood cultures 01/06/16 -->NTD - checking HLA-B27 antigen  Lymphedema: Uses a venous pump at home, has not used since d/c from [facility]. Leads to erythematous changes seen on exam in bilateral lower extremities - Furosemide 40 mg daily  Type 2 DM:  - Resistant SSI  PAF (CHADS-VASc 6): Patient not currently in atrial fibrillation.  - Dofetilide 125 mcg BID - Bisoprolol 5 mg daily - Eliquis 2.5 mg BID  HTN: Stable. 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 - Prednisone 10 mg daily  Chronic Back Pain: On chronic pain regimen. Recently switched from methadone. - Morphine 60 mg 12 hr q8h prn - Oxycodone 5 mg BID prn  Protein Calorie Malnutrition: Albumin 1.8. Nutrition has evaluated - Glucerna  DVT ppx: Eliquis  Dispo: Disposition is deferred at this time, awaiting improvement of current medical problems.    The patient does have a current PCP (Imran Gertie Baron, MD) and does not need an  [facility] hospital follow-up appointment after discharge.  The patient does have transportation limitations that hinder transportation to clinic appointments.  .Services Needed at time of discharge: Y = Yes, Blank = No PT:   OT:   RN:   Equipment:   Other:     LOS: 3 days   Liberty Handy, MD 01/10/2016, 8:34 AM

## 2016-01-10 NOTE — Discharge Summary (Signed)
Name: Cynthia Hicks MRN: [id number] DOB: [birth year] 82 y.o. PCP: Raelyn Number, MD  Date of Admission: 01/06/2016 12:38 PM Date of Discharge: 01/10/2016 Attending Physician: Sid Falcon, MD  Discharge Diagnosis: 1. Pseudomonal Septic Arthritis 2. Lymphedema 3. T2DM 4. PAF (CHA2DS2-VASc 6) 5. Autoimmune Hepatitis 6. Chronic Back Pain 7. Pressure Ulcer 8. Protein Calorie Malnutrition 9. HTN 10. CKD Stage 3B  Discharge Medications:   Medication List    TAKE these medications        acetaminophen 325 MG tablet  Commonly known as:  TYLENOL  Take 2 tablets (650 mg total) by mouth every 6 (six) hours as needed for mild pain (or Fever >/= 101).     alendronate 70 MG tablet  Commonly known as:  FOSAMAX  Take 70 mg by mouth every Monday. Take with a full glass of water on an empty stomach.     apixaban 2.5 MG Tabs tablet  Commonly known as:  ELIQUIS  Take 1 tablet (2.5 mg total) by mouth 2 (two) times daily.     bisoprolol 5 MG tablet  Commonly known as:  ZEBETA  Take 1 tablet (5 mg total) by mouth daily.     diclofenac sodium 1 % Gel  Commonly known as:  VOLTAREN  Apply 2 g topically 4 (four) times daily.     dofetilide 125 MCG capsule  Commonly known as:  TIKOSYN  Take 1 capsule (125 mcg total) by mouth 2 (two) times daily.     ezetimibe 10 MG tablet  Commonly known as:  ZETIA  Take 10 mg by mouth daily.     feeding supplement (GLUCERNA SHAKE) Liqd  Take 237 mLs by mouth 2 (two) times daily between meals.     FISH OIL + D3 PO  Take 1 capsule by mouth daily.     furosemide 40 MG tablet  Commonly known as:  LASIX  Take 20-40 mg by mouth 2 (two) times daily. 40 mg in the morning and 20 mg in the afternoon     gabapentin 100 MG capsule  Commonly known as:  NEURONTIN  Take 100 mg by mouth 3 (three) times daily.     levofloxacin 750 MG tablet  Commonly known as:  LEVAQUIN  Take 1 tablet (750 mg total) by mouth every other day. Last dose on 01/31/16.     losartan 25 MG tablet  Commonly known as:  COZAAR  TAKE 1 TABLET BY MOUTH EVERY DAY     magnesium oxide 400 (241.3 Mg) MG tablet  Commonly known as:  MAG-OX  Take 1 tablet (400 mg total) by mouth daily.     metolazone 2.5 MG tablet  Commonly known as:  ZAROXOLYN  Take 2.5 mg by mouth every 14 (fourteen) days. Patient takes every 2 weeks.     morphine 60 MG 12 hr tablet  Commonly known as:  MS CONTIN  Take 1 tablet (60 mg total) by mouth every 8 (eight) hours.     nitroGLYCERIN 0.4 MG SL tablet  Commonly known as:  NITROSTAT  Place 1 tablet (0.4 mg total) under the tongue every 5 (five) minutes as needed for chest pain.     omeprazole 20 MG capsule  Commonly known as:  PRILOSEC  Take 20 mg by mouth daily.     oxyCODONE 5 MG immediate release tablet  Commonly known as:  Oxy IR/ROXICODONE  Take 1 tablet (5 mg total) by mouth 2 (two) times daily as needed for moderate  pain.     polyvinyl alcohol 1.4 % ophthalmic solution  Commonly known as:  LIQUIFILM TEARS  Place 1 drop into both eyes daily as needed for dry eyes.     pravastatin 80 MG tablet  Commonly known as:  PRAVACHOL  Take 1 tablet (80 mg total) by mouth every evening.     predniSONE 10 MG tablet  Commonly known as:  DELTASONE  Take 30 mg by mouth daily. Patient started on a 70 mg dose taper on 12-31-15     RELION R IJ  Inject 8-15 Units as directed 3 (three) times daily. Takes 8 units at breakfast and lunch. Takes 12-15 units at supper per sliding scale.     vitamin C 1000 MG tablet  Take 1,000 mg by mouth 2 (two) times daily.     VITAMIN D PO  Take 2,000 Units by mouth at bedtime.        Disposition and follow-up:   [name] was discharged from [facility] in stable condition.  At the hospital follow up visit please address:  1.  Patient was found to have pseudomonal septic arthritis. She will take 750 mg Levaquin every other day, given CKD Stage 3B (GFR 38-44). Her last dose will  be on January 31, 2016.  2. Please re-evaluate status of septic arthritis after 3 week course of Levaquin.  3.  Labs / imaging needed at time of follow-up: BMETs twice weekly , patient on Levaquin, renally dosed. CRP and ESR in 2 weeks.  4.  Pending labs/ test needing follow-up: HLA-B27, Fungal culture (knee aspirate)  Follow-up Appointments: Follow-up Information    Follow up with DUDA,MARCUS V, MD In 2 weeks.   Specialty:  Orthopedic Surgery   Contact information:   [place] [address] [phone]       Schedule an appointment as soon as possible for a visit with HAGUE, Rosalyn Charters, MD.   Specialty:  Internal Medicine   Contact information:   [address] [place] [address] [phone]       Follow up with [facility] .   Specialty:  [place] information:   [address] [place] [phone]      Discharge Instructions:  Cynthia Hicks,  It was a pleasure taking care of you in the hospital.  You will take an antibiotic Levaquin every other day until June 27th. This will treat an infection in your knee joints. Please seek medical attention if the pain worsens or you develop fevers.  Best of luck on your recovery at your rehab facility. Remember, after you achieve your treatment goals of being able to walk, you can request to be discharged.  Have a great day!  Discharge Instructions    Diet - low sodium heart healthy    Complete by:  As directed      Increase activity slowly    Complete by:  As directed           Consultations: Scharlene Gloss, MD (Infectious Disease), Orthopedic Surgery  Procedures Performed:  Dg Knee Complete 4 Views Left  01/07/2016  CLINICAL DATA:  Bilateral knee pain and swelling for 2 weeks with redness, right worse than left. EXAM: LEFT KNEE - COMPLETE 4+ VIEW COMPARISON:  Left tibia/ fibula radiographs 12/16/2015 FINDINGS: No acute fracture, dislocation,  or or sizable knee joint effusion is identified. There is mild medial compartment joint space narrowing. Geographic, sclerotic  intramedullary lesions are again seen involving the distal femur and proximal tibia and are most consistent with bone infarcts. Vascular calcifications are noted. Mild nonspecific soft tissue thickening anterior to the knee. IMPRESSION: No acute osseous abnormality identified. 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 Electronically Signed   By: Logan Bores M.D.   On: 01/07/2016 14:28   Dg Knee Complete 4 Views Right  01/07/2016  CLINICAL DATA:  Bilateral knee pain and swelling for 2 weeks, right worse than left. Skin redness. EXAM: RIGHT KNEE - COMPLETE 4+ VIEW COMPARISON:  Right tibia/ fibula radiographs 12/16/2015. Right knee radiographs 09/12/2012. FINDINGS: No acute fracture, dislocation, or sizable knee joint effusion is identified. There is mild diffuse soft tissue thickening along the anterior aspect of the knee. There is at most mild medial compartment joint space narrowing. The bones are osteopenic. Vascular calcifications are noted. IMPRESSION: Anterior knee soft tissue thickening, nonspecific. No acute osseous abnormality identified. Electronically Signed   By: Logan Bores M.D.   On: 01/07/2016 14:25   Dg Fluoro Guided Needle Plc Aspiration/injection Loc  01/07/2016  Logan Bores, MD     01/07/2016  4:31 PM Bilateral knee joint aspirations performed. Only trace (1 mL or less) of serosanguinous fluid obtained from each knee with initial aspiration. 10 mL sterile saline then injected into each knee, reaspirated, and sent separately from initial aspirates.  Microbiology Recent Results (from the past 240 hour(s))  Blood Culture (routine x 2)     Status: None (Preliminary result)   Collection Time: 01/06/16  3:00 PM  Result Value Ref Range Status   Specimen Description BLOOD RIGHT ANTECUBITAL  Final   Special Requests   Final    BOTTLES DRAWN AEROBIC AND ANAEROBIC  BLUE 10CC RED 5CC   Culture NO GROWTH 3 DAYS  Final   Report Status PENDING  Incomplete  Blood Culture (routine x 2)     Status: None (Preliminary result)   Collection Time: 01/06/16  3:10 PM  Result Value Ref Range Status   Specimen Description BLOOD LEFT ANTECUBITAL  Final   Special Requests BOTTLES DRAWN AEROBIC ONLY 10CC  Final   Culture NO GROWTH 3 DAYS  Final   Report Status PENDING  Incomplete  Urine culture     Status: Abnormal   Collection Time: 01/06/16  9:41 PM  Result Value Ref Range Status   Specimen Description URINE, CLEAN CATCH  Final   Special Requests NONE  Final   Culture MULTIPLE SPECIES PRESENT, SUGGEST RECOLLECTION (A)  Final   Report Status 01/07/2016 FINAL  Final  Gram stain     Status: None   Collection Time: 01/07/16  6:05 PM  Result Value Ref Range Status   Specimen Description FLUID KNEE RIGHT  Final   Special Requests NONE  Final   Gram Stain   Final    ABUNDANT WBC PRESENT,BOTH PMN AND MONONUCLEAR NO ORGANISMS SEEN GRAM STAIN REVIEWED-AGREE WITH RESULT WILKINS,V    Report Status 01/07/2016 FINAL  Final  Anaerobic culture     Status: None (Preliminary result)   Collection Time: 01/07/16  6:07 PM  Result Value Ref Range Status   Specimen Description FLUID KNEE RIGHT  Final   Special Requests NONE  Final   Gram Stain   Final    ABUNDANT WBC PRESENT,BOTH PMN AND MONONUCLEAR NO ORGANISMS SEEN    Culture   Final    NO ANAEROBES ISOLATED; CULTURE IN PROGRESS FOR 5 DAYS   Report Status PENDING  Incomplete  Body  fluid culture     Status: None   Collection Time: 01/07/16  6:13 PM  Result Value Ref Range Status   Specimen Description FLUID KNEE RIGHT  Final   Special Requests NONE  Final   Gram Stain   Final    ABUNDANT WBC PRESENT,BOTH PMN AND MONONUCLEAR NO ORGANISMS SEEN    Culture   Final    FEW PSEUDOMONAS AERUGINOSA CRITICAL RESULT CALLED TO, READ BACK BY AND VERIFIED WITH: Markham Jordan AT 2426 01/08/16 BY L BENFIELD    Report Status  01/10/2016 FINAL  Final   Organism ID, Bacteria PSEUDOMONAS AERUGINOSA  Final      Susceptibility   Pseudomonas aeruginosa - MIC*    CEFTAZIDIME 4 SENSITIVE Sensitive     CIPROFLOXACIN <=0.25 SENSITIVE Sensitive     GENTAMICIN <=1 SENSITIVE Sensitive     IMIPENEM 1 SENSITIVE Sensitive     PIP/TAZO 8 SENSITIVE Sensitive     CEFEPIME 2 SENSITIVE Sensitive     * FEW PSEUDOMONAS AERUGINOSA  Body fluid culture     Status: None   Collection Time: 01/07/16  6:19 PM  Result Value Ref Range Status   Specimen Description FLUID KNEE LEFT  Final   Special Requests NONE  Final   Gram Stain   Final    FEW WBC PRESENT, PREDOMINANTLY MONONUCLEAR NO ORGANISMS SEEN    Culture   Final    FEW PSEUDOMONAS AERUGINOSA SUSCEPTIBILITIES PERFORMED ON PREVIOUS CULTURE WITHIN THE LAST 5 DAYS. CRITICAL RESULT CALLED TO, READ BACK BY AND VERIFIED WITH: Markham Jordan AT 8341 01/08/16 BY L BENFIELD    Report Status 01/10/2016 FINAL  Final  Gram stain     Status: None   Collection Time: 01/07/16  6:19 PM  Result Value Ref Range Status   Specimen Description FLUID KNEE LEFT  Final   Special Requests NONE  Final   Gram Stain   Final    FEW WBC PRESENT, PREDOMINANTLY MONONUCLEAR NO ORGANISMS SEEN GRAM STAIN REVIEWED-AGREE WITH RESULT WILKINS,V    Report Status 01/07/2016 FINAL  Final    Admission HPI: Cynthia Hicks is an 82 year old woman with a PMH of autoimmune hepatitis on chronic steroids, lymphedema of right leg, recurrent cellulitis, MSSA sepsis, CKD Stage 4, PAF (on Eliquis, dofetilide), PAD, CAD (s/p RCA, LCx DES) who comes to the hospital complaining of bilateral knee pain. The pain started approximately two weeks ago shortly after she was discharged from [facility], where she was treated for a cellulitis with vancomycin and discharged on clindamycin. It started as a pain in her right knee and she went to her PCP who noted a high uric acid and presumed she had a gout flare. She has never had gout before  and no knee aspiration was performed. She was started on a 5 day course of prednisone, 50 mg daily in addition to her 10 mg daily. Her pain continued to worsen and she noticed she was having pain in her left knee. She called her PCP a week later who started her on 70 mg prednisone with a 10 mg daily taper to her normal dose of 10 mg, which she completed. She was also given a single dose of colchicine. She noticed no improvement in her symptoms and her knee pain continued to worsen, and now she is unable to walk. She has noticed a red area start to appear over both knees as well. She also describes longstanding lymphedema, redness, and swelling in both legs. She has not  noticed any changes in her lower extremity swelling or redness. She has not using her "leg pump" for her swelling due to the knee pain. She denies any headache, light-headedness, fevers, dyspnea, cough, chest pain, palpitations, n/v/d, dysuria, changes in urination, blackouts, confusion, or falls. She was complaining of some "heartburn." In the ED, it was presumed the patient had lower extremity cellulitis and vancomycin and Zosyn were ordered. She was afebrile with a WBC of 24.6. Lactate of 2.12.   Hospital Course by problem list:   Pseudomonal Septic Arthritis: Patient had been started on antibiotics in the ED for a presumed cellulitis given her erythematous lower extremities (later surmised to be due to lymphedema); therefore, bilateral knee aspirates were obtained after she has already been on vancomycin and zosyn. Nonetheless, both L and R knee aspirates grew gram negative rods, eventually found to be Pseudomonas that was pan-sensitive as shown above. She received a knee washout bilaterally by orthopedic surgery, with drains placed thereafter and subsequently removed.  Given strong sensitivity to Levaquin, she was started on Levaquin 750 mg q48 hrs (renally dosed). She remained afebrile and WBCs decreased to 16. Her left knee pain resolved  but continued to have severe pain in her right knee with difficulty walking. Patient qualified and was approved for skilled nursing.  Lymphedema: Patient has longstanding lower extremity erythema and edema from a right leg abscess years ago. She was continued on her home furosemide.   T2DM: Patient intiially had elevated CBGs to 300s which were decreased to 100s on resistant SSI:   PAF (CHA2DS2-VASc 6): Eliquis was held until after knee washout on 01/08/16. Resumed on 6/5.  Autoimmune Hepatitis: Maintained on home dose of prednisone 10 mg. Patient has been on this medication for 25 years.  Chronic Back Pain: Patient maintained on home opioid regimen as above.  Pressure Ulcer: Noted a small area of partial thickness tissue loss at coccyx that has been present for three years is noted. A dried area with eschar is on the lateral left foot at the 5th digit.  Protein Calorie Malnutrition: Albumin of 1.8. Required protein supplementation.  HTN: Mildly hypertensive to 150s/90s, likely driven by pain.  CKD Stage 3B: Creatinine remained stable during admission.  Discharge Vitals:   BP 125/61 mmHg  Pulse 100  Temp(Src) 98.5 F (36.9 C) (Oral)  Resp 18  Ht 5' 1"  (1.549 m)  Wt 107 lb (48.535 kg)  BMI 20.23 kg/m2  SpO2 98%  Discharge Labs:  Results for orders placed or performed during the hospital encounter of 01/06/16 (from the past 24 hour(s))  Glucose, capillary     Status: Abnormal   Collection Time: 01/09/16  5:21 PM  Result Value Ref Range   Glucose-Capillary 177 (H) 65 - 99 mg/dL  Glucose, capillary     Status: None   Collection Time: 01/10/16 12:48 AM  Result Value Ref Range   Glucose-Capillary 94 65 - 99 mg/dL  CBC     Status: Abnormal   Collection Time: 01/10/16  6:46 AM  Result Value Ref Range   WBC 16.0 (H) 4.0 - 10.5 K/uL   RBC 3.67 (L) 3.87 - 5.11 MIL/uL   Hemoglobin 10.1 (L) 12.0 - 15.0 g/dL   HCT 34.2 (L) 36.0 - 46.0 %   MCV 93.2 78.0 - 100.0 fL   MCH 27.5 26.0 -  34.0 pg   MCHC 29.5 (L) 30.0 - 36.0 g/dL   RDW 21.1 (H) 11.5 - 15.5 %   Platelets 200 150 - 400  K/uL  Glucose, capillary     Status: Abnormal   Collection Time: 01/10/16  8:07 AM  Result Value Ref Range   Glucose-Capillary 150 (H) 65 - 99 mg/dL  Glucose, capillary     Status: Abnormal   Collection Time: 01/10/16 12:17 PM  Result Value Ref Range   Glucose-Capillary 391 (H) 65 - 99 mg/dL    Signed: Liberty Handy, MD 01/10/2016, 2:20 PM    Services Ordered on Discharge: Skilled Nursing

## 2016-01-10 NOTE — Progress Notes (Signed)
  Date: 01/10/2016  Patient name: Cynthia Hicks  Medical record number: [id number]  Date of birth: [birth year]   This patient has been seen and the plan of care was discussed with the house staff. Please see Dr. Barbera Setters note for complete details. I concur with his findings with the following additions/corrections: Discharge to rehab today on oral Abx of Levaquin.   Sid Falcon, MD 01/10/2016, 3:17 PM

## 2016-01-10 NOTE — Progress Notes (Signed)
Report given to Wilburt Finlay at [facility].

## 2016-01-10 NOTE — Progress Notes (Signed)
[facility] for Infectious Disease   Reason for visit: Follow up on native joint bilateral septic arthritis  Interval History: both cultures with pan-sensitive Pseudomonas.  Physical Exam: Constitutional:  Filed Vitals:   01/09/16 1411 01/10/16 0636  BP: 140/53 159/99  Pulse: 70 110  Temp: 98.4 F (36.9 C) 97.8 F (36.6 C)  Resp: 20 16   patient appears in NAD  Impression: septic arthritis  Plan: 1.  Since it is FQ senstive, can use oral high dose levaquin 750 mg renally adjusted.  Would treat for 3 weeks and reevaluate.  Will need twice weekly bmp; CRP and ESR in 2 weeks We will arrange follow up in [facility] in 2-3 weeks ESR 100 CRP 15.8, both in the setting of chronic autoimmune hepatitis  I will sign off, thanks

## 2016-01-11 LAB — CULTURE, BLOOD (ROUTINE X 2)
CULTURE: NO GROWTH
Culture: NO GROWTH

## 2016-01-12 LAB — ANAEROBIC CULTURE

## 2016-01-12 LAB — HLA-B27 ANTIGEN: HLA-B27: NEGATIVE

## 2016-01-17 ENCOUNTER — Ambulatory Visit: Payer: Medicare Other | Admitting: Internal Medicine

## 2016-01-19 ENCOUNTER — Emergency Department (HOSPITAL_COMMUNITY)
Admission: EM | Admit: 2016-01-19 | Discharge: 2016-01-19 | Disposition: A | Discharge status: Home / Self Care | Payer: Medicare Other | Attending: Emergency Medicine | Admitting: Emergency Medicine

## 2016-01-19 ENCOUNTER — Other Ambulatory Visit: Payer: Self-pay | Admitting: Physician Assistant

## 2016-01-19 ENCOUNTER — Encounter (HOSPITAL_COMMUNITY): Payer: Self-pay | Admitting: *Deleted

## 2016-01-19 ENCOUNTER — Emergency Department (HOSPITAL_COMMUNITY): Payer: Medicare Other

## 2016-01-19 ENCOUNTER — Other Ambulatory Visit: Payer: Self-pay

## 2016-01-19 DIAGNOSIS — R0789 Other chest pain: Secondary | ICD-10-CM | POA: Diagnosis present

## 2016-01-19 DIAGNOSIS — Z7901 Long term (current) use of anticoagulants: Secondary | ICD-10-CM | POA: Insufficient documentation

## 2016-01-19 DIAGNOSIS — K754 Autoimmune hepatitis: Secondary | ICD-10-CM | POA: Diagnosis not present

## 2016-01-19 DIAGNOSIS — K7689 Other specified diseases of liver: Secondary | ICD-10-CM | POA: Diagnosis present

## 2016-01-19 DIAGNOSIS — Z794 Long term (current) use of insulin: Secondary | ICD-10-CM | POA: Diagnosis not present

## 2016-01-19 DIAGNOSIS — N183 Chronic kidney disease, stage 3 (moderate): Secondary | ICD-10-CM | POA: Diagnosis not present

## 2016-01-19 DIAGNOSIS — E1140 Type 2 diabetes mellitus with diabetic neuropathy, unspecified: Secondary | ICD-10-CM | POA: Diagnosis not present

## 2016-01-19 DIAGNOSIS — I890 Lymphedema, not elsewhere classified: Secondary | ICD-10-CM | POA: Insufficient documentation

## 2016-01-19 DIAGNOSIS — IMO0002 Reserved for concepts with insufficient information to code with codable children: Secondary | ICD-10-CM | POA: Diagnosis present

## 2016-01-19 DIAGNOSIS — Z85828 Personal history of other malignant neoplasm of skin: Secondary | ICD-10-CM | POA: Diagnosis not present

## 2016-01-19 DIAGNOSIS — R079 Chest pain, unspecified: Secondary | ICD-10-CM

## 2016-01-19 DIAGNOSIS — Z9861 Coronary angioplasty status: Secondary | ICD-10-CM | POA: Diagnosis not present

## 2016-01-19 DIAGNOSIS — E1122 Type 2 diabetes mellitus with diabetic chronic kidney disease: Secondary | ICD-10-CM | POA: Insufficient documentation

## 2016-01-19 DIAGNOSIS — K219 Gastro-esophageal reflux disease without esophagitis: Secondary | ICD-10-CM | POA: Diagnosis present

## 2016-01-19 DIAGNOSIS — I5032 Chronic diastolic (congestive) heart failure: Secondary | ICD-10-CM | POA: Diagnosis not present

## 2016-01-19 DIAGNOSIS — E785 Hyperlipidemia, unspecified: Secondary | ICD-10-CM | POA: Diagnosis not present

## 2016-01-19 DIAGNOSIS — N189 Chronic kidney disease, unspecified: Secondary | ICD-10-CM | POA: Diagnosis present

## 2016-01-19 DIAGNOSIS — I480 Paroxysmal atrial fibrillation: Secondary | ICD-10-CM | POA: Diagnosis not present

## 2016-01-19 DIAGNOSIS — N184 Chronic kidney disease, stage 4 (severe): Secondary | ICD-10-CM | POA: Insufficient documentation

## 2016-01-19 DIAGNOSIS — I739 Peripheral vascular disease, unspecified: Secondary | ICD-10-CM | POA: Insufficient documentation

## 2016-01-19 DIAGNOSIS — I2510 Atherosclerotic heart disease of native coronary artery without angina pectoris: Secondary | ICD-10-CM | POA: Diagnosis not present

## 2016-01-19 DIAGNOSIS — I130 Hypertensive heart and chronic kidney disease with heart failure and stage 1 through stage 4 chronic kidney disease, or unspecified chronic kidney disease: Secondary | ICD-10-CM | POA: Diagnosis not present

## 2016-01-19 DIAGNOSIS — Z79899 Other long term (current) drug therapy: Secondary | ICD-10-CM | POA: Insufficient documentation

## 2016-01-19 DIAGNOSIS — E1165 Type 2 diabetes mellitus with hyperglycemia: Secondary | ICD-10-CM | POA: Diagnosis present

## 2016-01-19 DIAGNOSIS — R072 Precordial pain: Secondary | ICD-10-CM

## 2016-01-19 HISTORY — DX: Lymphedema, not elsewhere classified: I89.0

## 2016-01-19 HISTORY — DX: Peripheral vascular disease, unspecified: I73.9

## 2016-01-19 LAB — CBC
HCT: 29.5 % — ABNORMAL LOW (ref 36.0–46.0)
Hemoglobin: 8.8 g/dL — ABNORMAL LOW (ref 12.0–15.0)
MCH: 27.2 pg (ref 26.0–34.0)
MCHC: 29.8 g/dL — ABNORMAL LOW (ref 30.0–36.0)
MCV: 91 fL (ref 78.0–100.0)
PLATELETS: 159 10*3/uL (ref 150–400)
RBC: 3.24 MIL/uL — ABNORMAL LOW (ref 3.87–5.11)
RDW: 21.3 % — ABNORMAL HIGH (ref 11.5–15.5)
WBC: 9.7 10*3/uL (ref 4.0–10.5)

## 2016-01-19 LAB — BASIC METABOLIC PANEL
Anion gap: 6 (ref 5–15)
BUN: 40 mg/dL — AB (ref 6–20)
CHLORIDE: 105 mmol/L (ref 101–111)
CO2: 29 mmol/L (ref 22–32)
Calcium: 8.3 mg/dL — ABNORMAL LOW (ref 8.9–10.3)
Creatinine, Ser: 1.16 mg/dL — ABNORMAL HIGH (ref 0.44–1.00)
GFR calc Af Amer: 50 mL/min — ABNORMAL LOW (ref >60)
GFR calc non Af Amer: 43 mL/min — ABNORMAL LOW (ref >60)
GLUCOSE: 280 mg/dL — AB (ref 65–99)
POTASSIUM: 5.1 mmol/L (ref 3.5–5.1)
Sodium: 140 mmol/L (ref 135–145)

## 2016-01-19 LAB — TROPONIN I: Troponin I: <0.03 ng/mL (ref <0.031)

## 2016-01-19 LAB — I-STAT TROPONIN, ED: TROPONIN I, POC: 0.03 ng/mL (ref 0.00–0.08)

## 2016-01-19 MED ORDER — SODIUM CHLORIDE 0.9 % IV BOLUS (SEPSIS): 1000.0000 mL | Freq: Once | INTRAVENOUS | Status: DC

## 2016-01-19 NOTE — ED Notes (Signed)
Pt reported to  Have sacral decub & L heel pressure ulcer upon arrival to ED

## 2016-01-19 NOTE — Consult Note (Signed)
CARDIOLOGY CONSULT NOTE   Patient ID: Cynthia Hicks MRN: [id number] DOB/AGE: 11-17-1932 82 y.o.  Admit date: 01/19/2016  Requesting Physician: Dr. Alfonse Spruce Primary Physician:   Cynthia Nasuti, MD Primary Cardiologist:   Dr. Aundra Dubin Reason for Consultation:   Chest pain   HPI: Cynthia Hicks is a 82 y.o. female with a history of PAF on Tikosyn and Eliquis, chronic diastolic CHF, lymphedema, chronic venous insufficiency on venous pump, chronic cellulitis, dysphagia requring esophageal dilation, CKD, PAD, CAD s/p DESx2 to RCA (2007); DES to CFX (2013) and autoimmune hepatitis on chronic steroids who presented to the [facility] ER today with chest pain.  NSTEMI 1/07. LHC showed 80-90% mid LAD stenosis and 95% mid RCA stenosis. She had Taxus DES x 2 (overlapping) to the RCA. Procedure was complicated by wire perforation of PLV, PLV was coil embolized. She was admitted in 1/13 with NSTEMI. Left heart cath showed 80% mid LAD stenosis and 99% mid CFX stenosis (likely culprit). EF was 40-45% by echo and LV-gram. She had a PROMUS DES to the CFX. No intervention to the LAD. Lexiscan myoview done subsequently in 2/13 showed EF 49% with no ischemia. Echo 4/13 showed EF around 45-50% with basal to mid inferior and mid-apical posterior akinesis along with mild MR. She was admitted in 12/13 at [facility] with severe cellulitis and sepsis syndrome. She was seen here by Truitt Merle later in 12/13 and found to be in atrial fibrillation. Coumadin was started initially, but now she is on Eliquis. In 3/14, she was cardioverted. In 7/14, she had an echo showing EF 55-60% with basal inferior hypokinesis and a Lexiscan Cardiolite that was low risk with no ischemia. She went back into atrial fibrillation in 11/15 but was back in NSR when she showed up for DCCV. She had to be admitted at that time in 11/15 for PNA. TEE was done in 11/15 during the PNA admission showing EF 55-60%, mild MR. She was also found to have  significant PAD during 11/15 hospitalization. She was back in atrial fibrillation at her 2/16 appt. In 3/16, she was admitted for Tikosyn and converted back to NSR.She had a Cardiolite in 8/16 that showed EF 44% with fixed inferior and inferolateral defect, no ischemia. She was planned for cholecystectomy but ended up being admitted to the hospital in [place] in 8/16 for 12 days with sepsis and PNA, and surgery was put off.   Patient last saw Dr. Aundra Dubin 05/06/15 at which time she was taking Lasix once a day and metolazone once a week.  She subsequently diagnosed with MRSA involving a wound on the lateral aspect of her left foot felt possibly due to osteomyelitis of the fifth metatarsal with associated cellulitis. She was treated with clindamycin for 6 weeks.  She saw Estella Husk PA-C in 11/2015 with complaints of LE edema and admitted to forgetting to take lasix and weekly zaroxolyn. Sharyn Lull felt that it was due to a combination of chronic lymphedema as well as medication and dietary indiscretion. Her lasix was increased to 60mg  in the AM.   She was admitted 6/2-01/10/16 for pseudomonal septic arthritis s/p bilateral knee washouts by orhtho and placed on QOD levaquin 2/2 CKD. She was discharged to SNF.  Today she presents from SNF for evaluation of chest pain. ~9am she got out of bed and sat in the wheelchair to eat breakfast. She felt bad and wanted to go back to bed. When she laid down she started getting pain in  her chest. It was 8/10 pressure and last 30-45 minutes until they gave her SL NTG which completely took pain away and she has been chest pain free since. No SOB, nausea or diaphoresis. This was different from the pain she had when she had her heart attacks (which was a pain in her chest, N/Vand arm and jaw.) Currently LE edema, orthopnea or PND. No dizziness or syncope. No blood in stool or urine. Patient would really like to go home.     Past Medical History  Diagnosis Date  .  Hypertension   . Arthritis   . Diabetes mellitus   . Coronary artery disease     a.  MI 01/2003;  b.  02/2006 NSTEMI - LAD 80-47m, LCX nl, RCA 38m - RCA stented w/ Taxus DES;   c.  2009 Neg MV;  d. 11/2010 Echo - EF 65%;  e. 02/2013 MV: Carlton Adam Cardiolite that was low risk with no ischemia.  . Autoimmune liver disease     a.  Followed @ [facility] - on chronic prednisone and  sirolimus  . Hyperlipidemia     a. inability to stake statins due to liver disease  . Cellulitis     a. chronic cellulitis of LE  . CKD (chronic kidney disease), stage IV ([place])   . Bulging disc   . Sciatica   . Chronic back pain     a.  On Methadone  . Skin cancer (melanoma) ([place])   . Esophageal stricture     narrowing  . Diabetic neuropathy ([place])   . Sepsis ([place])   . PAF (paroxysmal atrial fibrillation) ([place])     a. Tikosyn initiated 10/2014;  CHA2DS2VASc = 6 (eliquis).  . Chronic diastolic CHF (congestive heart failure) ([place])     a. 06/2014 TEE: EF 60-65%, Gr 2 DD, mild MR, mildly dil LA, Grade IV descending thoracic Ao plaque.  Marland Kitchen PAD (peripheral artery disease) ([place])   . Chronic acquired lymphedema      Past Surgical History  Procedure Laterality Date  . Cardiac catheterization      Normal EF  . Coronary angioplasty  2007    2 DES placement to RCA complicated by a wire perforation  . Tubal ligation  1982  . Elbow surgery Left 1994    neuroma removed   . Abscess drainage Right     side  . Lamenectomy  1989    only 1 disc surgery  . Heart stents  2007  and  2012    X's 1  and  2012 X's 2 stents  . Cardioversion N/A 10/31/2012    Procedure: CARDIOVERSION;  Surgeon: Larey Dresser, MD;  Location: [facility] ENDOSCOPY;  Service: Cardiovascular;  Laterality: N/A;  . Tee without cardioversion N/A 06/22/2014    Procedure: TRANSESOPHAGEAL ECHOCARDIOGRAM (TEE);  Surgeon: Larey Dresser, MD;  Location: [facility] ENDOSCOPY;  Service: Cardiovascular;  Laterality: N/A;  . Left heart catheterization with coronary angiogram N/A  08/29/2011    Procedure: LEFT HEART CATHETERIZATION WITH CORONARY ANGIOGRAM;  Surgeon: Wellington Hampshire, MD;  Location: [place] CATH LAB;  Service: Cardiovascular;  Laterality: N/A;  . Percutaneous coronary stent intervention (pci-s)  08/29/2011    Procedure: PERCUTANEOUS CORONARY STENT INTERVENTION (PCI-S);  Surgeon: Wellington Hampshire, MD;  Location: [place] CATH LAB;  Service: Cardiovascular;;  . Knee arthroscopy Bilateral 01/08/2016    Procedure: ARTHROSCOPY KNEE;  Surgeon: Newt Minion, MD;  Location: [place];  Service: Orthopedics;  Laterality: Bilateral;    Allergies  Allergen Reactions  . Cyclosporine     Blasted bone marrow  . Azathioprine     Other reaction(s): Unknown  . Mycophenolate Other (See Comments)    Pre-op note mentions "intolerance" to mycophenolate.  Unknown reaction, unknown severity.  . Other     Other reaction(s): Other (See Comments) Uncoded Allergy. Allergen: Other Allergy: See Patient Chart for Details, Other Reaction: Toxicity  . Oysters [Shellfish Allergy] Nausea And Vomiting    I have reviewed the patient's current medications     Prior to Admission medications   Medication Sig Start Date End Date Taking? Authorizing Provider  acetaminophen (TYLENOL) 325 MG tablet Take 2 tablets (650 mg total) by mouth every 6 (six) hours as needed for mild pain (or Fever >/= 101). 01/10/16  Yes Liberty Handy, MD  alendronate (FOSAMAX) 70 MG tablet Take 70 mg by mouth every Monday. Take with a full glass of water on an empty stomach.   Yes Historical Provider, MD  apixaban (ELIQUIS) 2.5 MG TABS tablet Take 1 tablet (2.5 mg total) by mouth 2 (two) times daily. 03/07/15  Yes Larey Dresser, MD  Ascorbic Acid (VITAMIN C) 1000 MG tablet Take 1,000 mg by mouth 2 (two) times daily.    Yes Historical Provider, MD  bisoprolol (ZEBETA) 5 MG tablet Take 1 tablet (5 mg total) by mouth daily. 05/06/15  Yes Larey Dresser, MD  Cholecalciferol (VITAMIN D PO) Take 2,000 Units by mouth at bedtime.    Yes  Historical Provider, MD  clotrimazole (MYCELEX) 10 MG troche Take 10 mg by mouth 5 (five) times daily.   Yes Historical Provider, MD  dofetilide (TIKOSYN) 125 MCG capsule Take 1 capsule (125 mcg total) by mouth 2 (two) times daily. 12/16/15  Yes Larey Dresser, MD  feeding supplement, GLUCERNA SHAKE, (GLUCERNA SHAKE) LIQD Take 237 mLs by mouth 2 (two) times daily between meals. 01/10/16  Yes Liberty Handy, MD  furosemide (LASIX) 40 MG tablet Take 20-40 mg by mouth every other day. alternating   Yes Historical Provider, MD  gabapentin (NEURONTIN) 100 MG capsule Take 100 mg by mouth 3 (three) times daily.   Yes Historical Provider, MD  Insulin Regular Human (RELION R IJ) Inject 4-16 Units as directed 3 (three) times daily. BS 120-200=4 units, 201-250=6 units, 251-300=8 units, 301-350=10 units, 351-400=14 units, 401-450=16 units.   Yes Historical Provider, MD  losartan (COZAAR) 25 MG tablet TAKE 1 TABLET BY MOUTH EVERY DAY 11/30/15  Yes Larey Dresser, MD  magnesium oxide (MAG-OX) 400 (241.3 MG) MG tablet Take 1 tablet (400 mg total) by mouth daily. 10/22/14  Yes Rhonda G Barrett, PA-C  morphine (MS CONTIN) 60 MG 12 hr tablet Take 1 tablet (60 mg total) by mouth every 8 (eight) hours. 01/10/16  Yes Liberty Handy, MD  Multiple Vitamin (MULTIVITAMIN) tablet Take 1 tablet by mouth daily.   Yes Historical Provider, MD  nitroGLYCERIN (NITROSTAT) 0.4 MG SL tablet Place 1 tablet (0.4 mg total) under the tongue every 5 (five) minutes as needed for chest pain. 09/01/11  Yes Rogelia Mire, NP  omeprazole (PRILOSEC) 20 MG capsule Take 20 mg by mouth daily.  06/06/14  Yes Historical Provider, MD  oxyCODONE (OXY IR/ROXICODONE) 5 MG immediate release tablet Take 1 tablet (5 mg total) by mouth 2 (two) times daily as needed for moderate pain. Patient taking differently: Take 5 mg by mouth 2 (two) times daily.  01/10/16  Yes Liberty Handy, MD  polyvinyl alcohol (LIQUIFILM TEARS) 1.4 % ophthalmic  solution Place 1 drop into both  eyes daily as needed for dry eyes.   Yes Historical Provider, MD  pravastatin (PRAVACHOL) 80 MG tablet Take 1 tablet (80 mg total) by mouth every evening. 10/11/14  Yes Larey Dresser, MD  predniSONE (DELTASONE) 10 MG tablet Take 10 mg by mouth daily. Patient started on a 70 mg dose taper on 12-31-15   Yes Historical Provider, MD  diclofenac sodium (VOLTAREN) 1 % GEL Apply 2 g topically 4 (four) times daily. Patient not taking: Reported on 01/19/2016 01/10/16   Liberty Handy, MD  levofloxacin (LEVAQUIN) 750 MG tablet Take 1 tablet (750 mg total) by mouth every other day. Last dose on 01/31/16. 01/10/16 01/31/16  Liberty Handy, MD     Social History   Social History  . Marital Status: Married    Spouse Name: Jenny Reichmann  . Number of Children: 2  . Years of Education: N/A   Occupational History  . real Sport and exercise psychologist     retired   Social History Main Topics  . Smoking status: Never Smoker   . Smokeless tobacco: Never Used  . Alcohol Use: No  . Drug Use: No  . Sexual Activity: Not on file   Other Topics Concern  . Not on file   Social History Narrative   Pt lives in [place] with husband.  She is retired.  She is not very active @ home.    Family Status  Relation Status Death Age  . Mother Deceased   . Father Deceased 11    CHF  . Maternal Grandfather Deceased   . Sister Alive   . Brother Alive   . Paternal Grandmother Deceased   . Paternal Grandfather Deceased   . Maternal Grandmother Deceased    Family History  Problem Relation Age of Onset  . Lung cancer Mother 81  . Heart attack Father 11  . Heart disease Father   . Congestive Heart Failure Father   . Heart disease Maternal Grandfather   . Hypertension Brother   . Hypertension Son   . Pancreatic cancer Mother     ROS:  Full 14 point review of systems complete and found to be negative unless listed above.  Physical Exam: Blood pressure 105/61, pulse 78, temperature 97.6 F (36.4 C), temperature source Oral, resp. rate 18,  height 5\' 1"  (1.549 m), SpO2 96 %.  General: Well developed, well nourished, female in no acute distress Head: Eyes PERRLA, No xanthomas.   Normocephalic and atraumatic, oropharynx without edema or exudate.  Lungs: CTAB Heart: HRRR S1 S2, no rub/gallop, Heart regular rate and rhythm with S1, S2  murmur. pulses are 2+ extrem.   Neck: No carotid bruits. No lymphadenopathy. No JVD. Abdomen: Bowel sounds present, abdomen soft and non-tender without masses or hernias noted. Msk:  No spine or cva tenderness. No weakness, no joint deformities or effusions. Extremities: No clubbing or cyanosis.  Chronic LE edema and erythema  Neuro: Alert and oriented X 3. No focal deficits noted. Psych:  Good affect, responds appropriately Skin: No rashes or lesions noted.  Labs:   Lab Results  Component Value Date   WBC 9.7 01/19/2016   HGB 8.8* 01/19/2016   HCT 29.5* 01/19/2016   MCV 91.0 01/19/2016   PLT 159 01/19/2016   No results for input(s): INR in the last 72 hours.   Recent Labs Lab 01/19/16 1111  NA 140  K 5.1  CL 105  CO2 29  BUN 40*  CREATININE 1.16*  CALCIUM 8.3*  GLUCOSE 280*   MAGNESIUM  Date Value Ref Range Status  01/07/2016 2.2 1.7 - 2.4 mg/dL Final   No results for input(s): CKTOTAL, CKMB, TROPONINI in the last 72 hours.  Recent Labs  01/19/16 1123  TROPIPOC 0.03   PRO B NATRIURETIC PEPTIDE (BNP)  Date/Time Value Ref Range Status  05/06/2015 02:58 PM 180.0* 0.0 - 100.0 pg/mL Final  01/27/2015 04:33 PM 417.0* 0.0 - 100.0 pg/mL Final   Lab Results  Component Value Date   CHOL 170 10/11/2014   HDL 73.00 10/11/2014   LDLCALC 192 08/04/2009   TRIG 206.0* 10/11/2014    Echo: 06/2014 Study Conclusions - Left ventricle: The cavity size was normal. Wall thickness was increased in a pattern of mild LVH. Systolic function was normal.The estimated ejection fraction was in the range of 60% to 65%. Wall motion was normal; there were no regional wall motion  abnormalities. Features are consistent with a pseudonormal leftventricular filling pattern, with concomitant abnormal relaxation and increased filling pressure (grade 2 diastolic dysfunction). - Aortic valve: No evidence of vegetation. There was no stenosis. There was trivial regurgitation. - Aorta: There was prominent (grade IV) plaque in the descending thoracic aorta. - Mitral valve: No evidence of vegetation. There was mild regurgitation. - Left atrium: The atrium was mildly dilated. No evidence of thrombus in the atrial cavity or appendage. - Right ventricle: The cavity size was normal. Systolic function was normal. - Right atrium: The atrium was mildly dilated. - Atrial septum: No defect or patent foramen ovale was identified. Echo contrast study showed no right-to-left atrial level shunt, at baseline or with provocation. - Tricuspid valve: No evidence of vegetation. Peak RV-RA gradient 19 mmHg. - Pulmonic valve: No evidence of vegetation. Impressions: - No evidence of endocarditis.   Nuclear stress test 03/07/15 inferolateral fixed defect consistent with prior MI, no ischemia EF 44%  ECG:  Sinus HR 82. Non specific ST/TW changes.  Radiology:  Dg Chest 2 View  01/19/2016  CLINICAL DATA:  Chest pain, weakness EXAM: CHEST  2 VIEW COMPARISON:  06/10/2015 FINDINGS: Cardiomediastinal silhouette is stable. No infiltrate or pulmonary edema. There is chronic elevation of the right hemidiaphragm. Mild right basilar atelectasis. Extensive degenerative changes right shoulder. Thoracic spine osteopenia. IMPRESSION: No infiltrate or pulmonary edema. Chronic elevation of the right hemidiaphragm with right basilar atelectasis. Extensive degenerative changes right shoulder. Electronically Signed   By: Lahoma Crocker M.D.   On: 01/19/2016 11:51    ASSESSMENT AND PLAN:     Principal Problem:   Chest pain Active Problems:   GERD   Autoimmune hepatitis ([place])   Coronary artery disease    Chronic kidney disease   Autoimmune liver disease   Diabetes mellitus type 2, uncontrolled ([place])  BRANAE WEISCHEDEL is a 82 y.o. female with a history of PAF on Tikosyn and Eliquis, chronic diastolic CHF, lymphedema, chronic venous insufficiency on venous pump, chronic cellulitis, dysphagia requring esophageal dilation, CKD, PAD, CAD s/p DESx2 to RCA (2007); DES to CFX (2013) and autoimmune hepatitis on chronic steroids who presented to the [facility] ER today with chest pain.  Chest pain: troponin negx1. ECG with no acute ST or TW changes but some TWI similar to previous ECGs. Now chest pain free after 1SL NTG. Patient really wanting to go home. Possibly could go home with outpatient nuclear stress test if delta troponin negative. If she rules in, she will need to have a cath.  CAD:  s/p DESx2 to RCA (2007); DES  to CFX (2013). Continue statin and BB. No ASA due to NOAC use.   Lymphedema: Patient has longstanding lower extremity erythema and edema from a right leg abscess years ago. Continue home furosemide.   T2DM: continue home regimen   PAF: maintaining NSR on Tikosyn. Continue on renally dosed Eliquis 2.5mg  BID for CHA2DS2-VASc 6. ECG from EMS reads afib but I think this is just NSR with PACs  Autoimmune Hepatitis: Maintained on home dose of prednisone 10 mg. Patient has been on this medication for 25 years.  Chronic Back Pain: Patient maintained on methadone  HTN: BP currently soft   CKD Stage 3: crea 1.16; GFR 27.05  Anemia of chronic disease: Hg down to 8.8 from 10.1 on 01/10/16  Signed: Angelena Form, PA-C 01/19/2016 1:58 PM Pager [phone] Co-Sign MD  The patient was seen, examined and discussed with Lorretta Harp, PA-C and I agree with the above.   A pleasant 82 y.o. female with a history of PAF on Tikosyn and Eliquis, chronic diastolic CHF, lymphedema, chronic venous insufficiency on venous pump, chronic cellulitis, dysphagia requring esophageal dilation, CKD, PAD, CAD s/p DESx2 to  RCA (2007); DES to CFX (2013) and autoimmune hepatitis on chronic steroids who presented to the [facility] ER today with chest pain. This is a very unfortunate female we'll has multiple medical problems most recently underwent treatment for severe cellulitis and sepsis, as well as debridement of septic knees. She presented today with retrosternal chest pain that has resolved with nitroglycerin. A year ago in August 2016 she underwent a Lexi scan nuclear stress test with it ST depression with a Lexi scan injection as well as some mild peri-infarct ischemia in the mid and basal inferolateral wall SDS 4.   She is currently chest issues to go home, her first troponin is negative, her EKG is unchanged from prior. We will recheck another troponin and if negative will send back to her skilled nursing facility. They'll schedule an outpatient stress test followed up by an appointment in our clinic.  Houston Siren Ashdon Gillson,MD 01/19/2016

## 2016-01-19 NOTE — ED Notes (Signed)
Assisted pt to restroom by wc

## 2016-01-19 NOTE — ED Notes (Signed)
Assumed care at 1110

## 2016-01-19 NOTE — ED Provider Notes (Signed)
CSN: [id number]     Arrival date & time 01/19/16  49 History   First MD Initiated Contact with Patient 01/19/16 1054     Chief Complaint  Patient presents with  . Chest Pain  . Leg Pain     (Consider location/radiation/quality/duration/timing/severity/associated sxs/prior Treatment) HPI Comments: 82 year old female with history of hypertension, coronary artery disease, diabetes mellitus presents for chest pain. Patient reports that she had a 45 minute episode of chest pressure this morning that was relieved with nitroglycerin the EMS. She said she maybe felt slightly short of breath as well. Denies cough, fever, chills. She denies any current chest pain. She says this was different than her previous heart attacks because usually she had radiation with those episodes but did not have any radiation of her pain today.   Past Medical History  Diagnosis Date  . Hypertension   . Arthritis   . Diabetes mellitus   . Coronary artery disease     a.  MI 01/2003;  b.  02/2006 NSTEMI - LAD 80-59m, LCX nl, RCA 53m - RCA stented w/ Taxus DES;   c.  2009 Neg MV;  d. 11/2010 Echo - EF 65%;  e. 02/2013 MV: [facility] Adam Cardiolite that was low risk with no ischemia.  . Autoimmune liver disease     a.  Followed @ [facility] - on chronic prednisone and  sirolimus  . Hyperlipidemia     a. inability to stake statins due to liver disease  . Cellulitis     a. chronic cellulitis of LE  . CKD (chronic kidney disease), stage IV ([place])   . Bulging disc   . Sciatica   . Chronic back pain     a.  On Methadone  . Skin cancer (melanoma) ([place])   . Esophageal stricture     narrowing  . Diabetic neuropathy ([place])   . Sepsis ([place])   . PAF (paroxysmal atrial fibrillation) ([place])     a. Tikosyn initiated 10/2014;  CHA2DS2VASc = 6 (eliquis).  . Chronic diastolic CHF (congestive heart failure) ([place])     a. 06/2014 TEE: EF 60-65%, Gr 2 DD, mild MR, mildly dil LA, Grade IV descending thoracic Ao plaque.   Past Surgical History   Procedure Laterality Date  . Cardiac catheterization      Normal EF  . Coronary angioplasty  2007    2 DES placement to RCA complicated by a wire perforation  . Tubal ligation  1982  . Elbow surgery Left 1994    neuroma removed   . Abscess drainage Right     side  . Lamenectomy  1989    only 1 disc surgery  . Heart stents  2007  and  2012    X's 1  and  2012 X's 2 stents  . Cardioversion N/A 10/31/2012    Procedure: CARDIOVERSION;  Surgeon: Larey Dresser, MD;  Location: [facility] ENDOSCOPY;  Service: Cardiovascular;  Laterality: N/A;  . Tee without cardioversion N/A 06/22/2014    Procedure: TRANSESOPHAGEAL ECHOCARDIOGRAM (TEE);  Surgeon: Larey Dresser, MD;  Location: [facility] ENDOSCOPY;  Service: Cardiovascular;  Laterality: N/A;  . Left heart catheterization with coronary angiogram N/A 08/29/2011    Procedure: LEFT HEART CATHETERIZATION WITH CORONARY ANGIOGRAM;  Surgeon: Wellington Hampshire, MD;  Location: [facility] CATH LAB;  Service: Cardiovascular;  Laterality: N/A;  . Percutaneous coronary stent intervention (pci-s)  08/29/2011    Procedure: PERCUTANEOUS CORONARY STENT INTERVENTION (PCI-S);  Surgeon: Wellington Hampshire, MD;  Location: [facility] CATH LAB;  Service: Cardiovascular;;  . Knee arthroscopy Bilateral 01/08/2016    Procedure: ARTHROSCOPY KNEE;  Surgeon: Newt Minion, MD;  Location: [place];  Service: Orthopedics;  Laterality: Bilateral;   Family History  Problem Relation Age of Onset  . Lung cancer Mother 75  . Heart attack Father 71  . Heart disease Father   . Congestive Heart Failure Father   . Heart disease Maternal Grandfather   . Hypertension Brother   . Hypertension Son   . Pancreatic cancer Mother    Social History  Substance Use Topics  . Smoking status: Never Smoker   . Smokeless tobacco: Never Used  . Alcohol Use: No   OB History    No data available     Review of Systems  Constitutional: Negative for fever, chills and fatigue.  HENT: Negative for congestion, postnasal drip  and rhinorrhea.   Respiratory: Negative for cough, chest tightness, shortness of breath and wheezing.   Cardiovascular: Positive for chest pain. Negative for palpitations and leg swelling.  Gastrointestinal: Negative for nausea, vomiting, abdominal pain, diarrhea and constipation.  Genitourinary: Negative for dysuria, urgency and hematuria.  Musculoskeletal: Negative for myalgias and back pain.  Skin: Negative for rash.  Neurological: Negative for dizziness, weakness, light-headedness, numbness and headaches.  Hematological: Does not bruise/bleed easily.      Allergies  Cyclosporine; Azathioprine; Mycophenolate; Other; and Oysters  Home Medications   Prior to Admission medications   Medication Sig Start Date End Date Taking? Authorizing Provider  acetaminophen (TYLENOL) 325 MG tablet Take 2 tablets (650 mg total) by mouth every 6 (six) hours as needed for mild pain (or Fever >/= 101). 01/10/16   Liberty Handy, MD  alendronate (FOSAMAX) 70 MG tablet Take 70 mg by mouth every Monday. Take with a full glass of water on an empty stomach.    Historical Provider, MD  apixaban (ELIQUIS) 2.5 MG TABS tablet Take 1 tablet (2.5 mg total) by mouth 2 (two) times daily. 03/07/15   Larey Dresser, MD  Ascorbic Acid (VITAMIN C) 1000 MG tablet Take 1,000 mg by mouth 2 (two) times daily.     Historical Provider, MD  bisoprolol (ZEBETA) 5 MG tablet Take 1 tablet (5 mg total) by mouth daily. 05/06/15   Larey Dresser, MD  Cholecalciferol (VITAMIN D PO) Take 2,000 Units by mouth at bedtime.     Historical Provider, MD  diclofenac sodium (VOLTAREN) 1 % GEL Apply 2 g topically 4 (four) times daily. 01/10/16   Liberty Handy, MD  dofetilide (TIKOSYN) 125 MCG capsule Take 1 capsule (125 mcg total) by mouth 2 (two) times daily. 12/16/15   Larey Dresser, MD  ezetimibe (ZETIA) 10 MG tablet Take 10 mg by mouth daily.      Historical Provider, MD  feeding supplement, GLUCERNA SHAKE, (GLUCERNA SHAKE) LIQD Take 237 mLs by mouth  2 (two) times daily between meals. 01/10/16   Liberty Handy, MD  Fish Oil-Cholecalciferol (FISH OIL + D3 PO) Take 1 capsule by mouth daily.     Historical Provider, MD  furosemide (LASIX) 40 MG tablet Take 20-40 mg by mouth 2 (two) times daily. 40 mg in the morning and 20 mg in the afternoon    Historical Provider, MD  gabapentin (NEURONTIN) 100 MG capsule Take 100 mg by mouth 3 (three) times daily.    Historical Provider, MD  Insulin Regular Human (RELION R IJ) Inject 8-15 Units as directed 3 (three) times daily. Takes 8 units at breakfast and lunch. Takes  12-15 units at supper per sliding scale.    Historical Provider, MD  levofloxacin (LEVAQUIN) 750 MG tablet Take 1 tablet (750 mg total) by mouth every other day. Last dose on 01/31/16. 01/10/16 01/31/16  Liberty Handy, MD  losartan (COZAAR) 25 MG tablet TAKE 1 TABLET BY MOUTH EVERY DAY 11/30/15   Larey Dresser, MD  magnesium oxide (MAG-OX) 400 (241.3 MG) MG tablet Take 1 tablet (400 mg total) by mouth daily. 10/22/14   Rhonda G Barrett, PA-C  metolazone (ZAROXOLYN) 2.5 MG tablet Take 2.5 mg by mouth every 14 (fourteen) days. Patient takes every 2 weeks.    Historical Provider, MD  morphine (MS CONTIN) 60 MG 12 hr tablet Take 1 tablet (60 mg total) by mouth every 8 (eight) hours. 01/10/16   Liberty Handy, MD  nitroGLYCERIN (NITROSTAT) 0.4 MG SL tablet Place 1 tablet (0.4 mg total) under the tongue every 5 (five) minutes as needed for chest pain. 09/01/11   Rogelia Mire, NP  omeprazole (PRILOSEC) 20 MG capsule Take 20 mg by mouth daily.  06/06/14   Historical Provider, MD  oxyCODONE (OXY IR/ROXICODONE) 5 MG immediate release tablet Take 1 tablet (5 mg total) by mouth 2 (two) times daily as needed for moderate pain. 01/10/16   Liberty Handy, MD  polyvinyl alcohol (LIQUIFILM TEARS) 1.4 % ophthalmic solution Place 1 drop into both eyes daily as needed for dry eyes.    Historical Provider, MD  pravastatin (PRAVACHOL) 80 MG tablet Take 1 tablet (80 mg total) by mouth  every evening. 10/11/14   Larey Dresser, MD  predniSONE (DELTASONE) 10 MG tablet Take 30 mg by mouth daily. Patient started on a 70 mg dose taper on 12-31-15    Historical Provider, MD   BP 107/51 mmHg  Pulse 111  Temp(Src) 97.6 F (36.4 C) (Oral)  Resp 9  Ht 5\' 1"  (1.549 m)  SpO2 68% Physical Exam  Constitutional: She is oriented to person, place, and time. She appears well-developed and well-nourished. No distress.  HENT:  Head: Normocephalic and atraumatic.  Right Ear: External ear normal.  Left Ear: External ear normal.  Nose: Nose normal.  Mouth/Throat: Oropharynx is clear and moist. No oropharyngeal exudate.  Eyes: EOM are normal. Pupils are equal, round, and reactive to light.  Neck: Normal range of motion. Neck supple.  Cardiovascular: Normal rate, regular rhythm and intact distal pulses.   No murmur heard. Pulmonary/Chest: Effort normal. No respiratory distress. She has no wheezes. She has no rales.  Abdominal: Soft. She exhibits no distension. There is no tenderness.  Musculoskeletal: Normal range of motion. She exhibits no edema or tenderness.  Neurological: She is alert and oriented to person, place, and time.  Skin: Skin is warm and dry. No rash noted. She is not diaphoretic.  Ulceration over her right heel  Vitals reviewed.   ED Course  Procedures (including critical care time) Labs Review Labs Reviewed  BASIC METABOLIC PANEL  [place], ED    Imaging Review No results found. I have personally reviewed and evaluated these images and lab results as part of my medical decision-making.   EKG Interpretation None      MDM  Patient was seen and evaluated in stable condition. Patient chest pain-free. She already received nitroglycerin and aspirin from EMS. Initial troponin normal.  Patient with mild drop in Hb liked related to recurrent epistaxis patient has been experiencing outpatient.  Patient also with multiple bruises on her arms and legs.  Patient was seen and evaluated by cardiology.  Cardiology recommended repeat troponin and if normal she will be discharged with scheduled outpatient stress test in their office but if positive then cardiology to admit. Final diagnoses:  None    1. Chest pain    Harvel Quale, MD 01/19/16 [phone]

## 2016-01-19 NOTE — Progress Notes (Signed)
'    Delta troponin negative. Okay to be discharged home. Stress test arranged for next week in follow up info   Angelena Form PA-C  [facility]

## 2016-01-19 NOTE — ED Notes (Signed)
Pt refused IV saline lock

## 2016-01-19 NOTE — Discharge Instructions (Signed)
Chest Pain  Chest pain can be caused by many different conditions. There is always a chance that your pain could be related to something serious, such as a heart attack or a blood clot in your lungs. Chest pain can also be caused by conditions that are not life-threatening. If you have chest pain, it is very important to follow up with your health care provider. CAUSES  Chest pain can be caused by:  Heartburn.  Pneumonia or bronchitis.  Anxiety or stress.  Inflammation around your heart (pericarditis) or lung (pleuritis or pleurisy).  A blood clot in your lung.  A collapsed lung (pneumothorax). It can develop suddenly on its own (spontaneous pneumothorax) or from trauma to the chest.  Shingles infection (varicella-zoster virus).  Heart attack.  Damage to the bones, muscles, and cartilage that make up your chest wall. This can include:  Bruised bones due to injury.  Strained muscles or cartilage due to frequent or repeated coughing or overwork.  Fracture to one or more ribs.  Sore cartilage due to inflammation (costochondritis). RISK FACTORS  Risk factors for chest pain may include:  Activities that increase your risk for trauma or injury to your chest.  Respiratory infections or conditions that cause frequent coughing.  Medical conditions or overeating that can cause heartburn.  Heart disease or family history of heart disease.  Conditions or health behaviors that increase your risk of developing a blood clot.  Having had chicken pox (varicella zoster). SIGNS AND SYMPTOMS Chest pain can feel like:  Burning or tingling on the surface of your chest or deep in your chest.  Crushing, pressure, aching, or squeezing pain.  Dull or sharp pain that is worse when you move, cough, or take a deep breath.  Pain that is also felt in your back, neck, shoulder, or arm, or pain that spreads to any of these areas. Your chest pain may come and go, or it may stay  constant. DIAGNOSIS Lab tests or other studies may be needed to find the cause of your pain. Your health care provider may have you take a test called an ambulatory ECG (electrocardiogram). An ECG records your heartbeat patterns at the time the test is performed. You may also have other tests, such as:  Transthoracic echocardiogram (TTE). During echocardiography, sound waves are used to create a picture of all of the heart structures and to look at how blood flows through your heart.  Transesophageal echocardiogram (TEE).This is a more advanced imaging test that obtains images from inside your body. It allows your health care provider to see your heart in finer detail.  Cardiac monitoring. This allows your health care provider to monitor your heart rate and rhythm in real time.  Holter monitor. This is a portable device that records your heartbeat and can help to diagnose abnormal heartbeats. It allows your health care provider to track your heart activity for several days, if needed.  Stress tests. These can be done through exercise or by taking medicine that makes your heart beat more quickly.  Blood tests.  Imaging tests. TREATMENT  Your treatment depends on what is causing your chest pain. Treatment may include:  Medicines. These may include:  Acid blockers for heartburn.  Anti-inflammatory medicine.  Pain medicine for inflammatory conditions.  Antibiotic medicine, if an infection is present.  Medicines to dissolve blood clots.  Medicines to treat coronary artery disease.  Supportive care for conditions that do not require medicines. This may include:  Resting.  Applying heat or  cold packs to injured areas.  Limiting activities until pain decreases. HOME CARE INSTRUCTIONS  If you were prescribed an antibiotic medicine, finish it all even if you start to feel better.  Avoid any activities that bring on chest pain.  Do not use any tobacco products, including  cigarettes, chewing tobacco, or electronic cigarettes. If you need help quitting, ask your health care provider.  Do not drink alcohol.  Take medicines only as directed by your health care provider.  Keep all follow-up visits as directed by your health care provider. This is important. This includes any further testing if your chest pain does not go away.  If heartburn is the cause for your chest pain, you may be told to keep your head raised (elevated) while sleeping. This reduces the chance that acid will go from your stomach into your esophagus.  Make lifestyle changes as directed by your health care provider. These may include:  Getting regular exercise. Ask your health care provider to suggest some activities that are safe for you.  Eating a heart-healthy diet. A registered dietitian can help you to learn healthy eating options.  Maintaining a healthy weight.  Managing diabetes, if necessary.  Reducing stress. SEEK MEDICAL CARE IF:  Your chest pain does not go away after treatment.  You have a rash with blisters on your chest.  You have a fever. SEEK IMMEDIATE MEDICAL CARE IF:   Your chest pain is worse.  You have an increasing cough, or you cough up blood.  You have severe abdominal pain.  You have severe weakness.  You faint.  You have chills.  You have sudden, unexplained chest discomfort.  You have sudden, unexplained discomfort in your arms, back, neck, or jaw.  You have shortness of breath at any time.  You suddenly start to sweat, or your skin gets clammy.  You feel nauseous or you vomit.  You suddenly feel light-headed or dizzy.  Your heart begins to beat quickly, or it feels like it is skipping beats. These symptoms may represent a serious problem that is an emergency. Do not wait to see if the symptoms will go away. Get medical help right away. Call your local emergency services (911 in the [place] Do not drive yourself to the hospital.   This  information is not intended to replace advice given to you by your health care provider. Make sure you discuss any questions you have with your health care provider.   Document Released: 05/02/2005 Document Revised: 08/13/2014 Document Reviewed: 02/26/2014 Elsevier Interactive Patient Education [employer].

## 2016-01-19 NOTE — ED Notes (Signed)
Pt in from [place] via [facility] c/o mid non radiating Cp onset today @ 9am, denies SOB, n/v, pt refused IV in route, pt had bil knee fluid aspiration on 01/06/16, pt reports taking Levaquin every other day, pt hx of a fib with hx of the same, pt takes Xerelto, similar symptoms x 2 days ago with the pt in a fib that subsided, pt A&O x4, follows commands, speaks in complete sentences, pt took x 1 sL nitro pta & 324 mg ASA

## 2016-01-24 ENCOUNTER — Telehealth (HOSPITAL_COMMUNITY): Payer: Self-pay | Admitting: *Deleted

## 2016-01-24 NOTE — Telephone Encounter (Signed)
Left message on voicemail in reference to upcoming appointment scheduled for 01/27/16. Phone number given for a call back so details instructions can be given. Lamira Borin, Ranae [place]

## 2016-01-24 NOTE — Telephone Encounter (Signed)
Attempted to call patient regarding upcoming appointment- no answer. Jenilyn Magana J Dylon Correa, RN 

## 2016-01-26 ENCOUNTER — Emergency Department (HOSPITAL_COMMUNITY): Payer: Medicare Other

## 2016-01-26 ENCOUNTER — Inpatient Hospital Stay (HOSPITAL_COMMUNITY): Payer: Medicare Other

## 2016-01-26 ENCOUNTER — Encounter (HOSPITAL_COMMUNITY): Payer: Self-pay | Admitting: *Deleted

## 2016-01-26 ENCOUNTER — Observation Stay (HOSPITAL_COMMUNITY)
Admission: EM | Admit: 2016-01-26 | Discharge: 2016-01-27 | Disposition: A | Discharge status: Skilled Nursing Facility | Payer: Medicare Other | Attending: Student in an Organized Health Care Education/Training Program | Admitting: Student in an Organized Health Care Education/Training Program

## 2016-01-26 DIAGNOSIS — I5032 Chronic diastolic (congestive) heart failure: Secondary | ICD-10-CM | POA: Diagnosis not present

## 2016-01-26 DIAGNOSIS — R531 Weakness: Secondary | ICD-10-CM

## 2016-01-26 DIAGNOSIS — K5900 Constipation, unspecified: Secondary | ICD-10-CM | POA: Insufficient documentation

## 2016-01-26 DIAGNOSIS — I252 Old myocardial infarction: Secondary | ICD-10-CM | POA: Insufficient documentation

## 2016-01-26 DIAGNOSIS — Z85820 Personal history of malignant melanoma of skin: Secondary | ICD-10-CM | POA: Insufficient documentation

## 2016-01-26 DIAGNOSIS — E875 Hyperkalemia: Secondary | ICD-10-CM | POA: Insufficient documentation

## 2016-01-26 DIAGNOSIS — M549 Dorsalgia, unspecified: Secondary | ICD-10-CM | POA: Insufficient documentation

## 2016-01-26 DIAGNOSIS — M009 Pyogenic arthritis, unspecified: Secondary | ICD-10-CM | POA: Insufficient documentation

## 2016-01-26 DIAGNOSIS — E1140 Type 2 diabetes mellitus with diabetic neuropathy, unspecified: Secondary | ICD-10-CM | POA: Diagnosis not present

## 2016-01-26 DIAGNOSIS — Z955 Presence of coronary angioplasty implant and graft: Secondary | ICD-10-CM | POA: Insufficient documentation

## 2016-01-26 DIAGNOSIS — D649 Anemia, unspecified: Secondary | ICD-10-CM | POA: Diagnosis not present

## 2016-01-26 DIAGNOSIS — N184 Chronic kidney disease, stage 4 (severe): Secondary | ICD-10-CM | POA: Insufficient documentation

## 2016-01-26 DIAGNOSIS — K754 Autoimmune hepatitis: Secondary | ICD-10-CM | POA: Diagnosis not present

## 2016-01-26 DIAGNOSIS — R1031 Right lower quadrant pain: Secondary | ICD-10-CM

## 2016-01-26 DIAGNOSIS — E785 Hyperlipidemia, unspecified: Secondary | ICD-10-CM | POA: Insufficient documentation

## 2016-01-26 DIAGNOSIS — I130 Hypertensive heart and chronic kidney disease with heart failure and stage 1 through stage 4 chronic kidney disease, or unspecified chronic kidney disease: Secondary | ICD-10-CM | POA: Insufficient documentation

## 2016-01-26 DIAGNOSIS — G8929 Other chronic pain: Secondary | ICD-10-CM | POA: Diagnosis not present

## 2016-01-26 DIAGNOSIS — Z7952 Long term (current) use of systemic steroids: Secondary | ICD-10-CM | POA: Diagnosis not present

## 2016-01-26 DIAGNOSIS — R52 Pain, unspecified: Secondary | ICD-10-CM

## 2016-01-26 DIAGNOSIS — E1122 Type 2 diabetes mellitus with diabetic chronic kidney disease: Secondary | ICD-10-CM | POA: Diagnosis not present

## 2016-01-26 DIAGNOSIS — I2510 Atherosclerotic heart disease of native coronary artery without angina pectoris: Secondary | ICD-10-CM | POA: Diagnosis not present

## 2016-01-26 LAB — FOLATE: Folate: 28.8 ng/mL (ref >5.9)

## 2016-01-26 LAB — COMPREHENSIVE METABOLIC PANEL
ALK PHOS: 68 U/L (ref 38–126)
ALT: 12 U/L — ABNORMAL LOW (ref 14–54)
ANION GAP: 7 (ref 5–15)
AST: 13 U/L — ABNORMAL LOW (ref 15–41)
Albumin: 2.3 g/dL — ABNORMAL LOW (ref 3.5–5.0)
BUN: 40 mg/dL — ABNORMAL HIGH (ref 6–20)
CALCIUM: 8.1 mg/dL — AB (ref 8.9–10.3)
CO2: 26 mmol/L (ref 22–32)
Chloride: 102 mmol/L (ref 101–111)
Creatinine, Ser: 1.39 mg/dL — ABNORMAL HIGH (ref 0.44–1.00)
GFR calc non Af Amer: 35 mL/min — ABNORMAL LOW (ref >60)
GFR, EST AFRICAN AMERICAN: 40 mL/min — AB (ref >60)
Glucose, Bld: 217 mg/dL — ABNORMAL HIGH (ref 65–99)
POTASSIUM: 5.6 mmol/L — AB (ref 3.5–5.1)
SODIUM: 135 mmol/L (ref 135–145)
TOTAL PROTEIN: 4.6 g/dL — AB (ref 6.5–8.1)
Total Bilirubin: 0.6 mg/dL (ref 0.3–1.2)

## 2016-01-26 LAB — CBC
HCT: 22 % — ABNORMAL LOW (ref 36.0–46.0)
Hemoglobin: 6.9 g/dL — CL (ref 12.0–15.0)
MCH: 28.2 pg (ref 26.0–34.0)
MCHC: 31.4 g/dL (ref 30.0–36.0)
MCV: 89.8 fL (ref 78.0–100.0)
Platelets: 84 K/uL — ABNORMAL LOW (ref 150–400)
RBC: 2.45 MIL/uL — ABNORMAL LOW (ref 3.87–5.11)
RDW: 20.5 % — ABNORMAL HIGH (ref 11.5–15.5)
WBC: 5.3 K/uL (ref 4.0–10.5)

## 2016-01-26 LAB — LACTATE DEHYDROGENASE: LDH: 126 U/L (ref 98–192)

## 2016-01-26 LAB — CBC WITH DIFFERENTIAL/PLATELET
BASOS ABS: 0 10*3/uL (ref 0.0–0.1)
BASOS PCT: 0 %
EOS ABS: 0 10*3/uL (ref 0.0–0.7)
EOS PCT: 0 %
HCT: 24.2 % — ABNORMAL LOW (ref 36.0–46.0)
Hemoglobin: 7.5 g/dL — ABNORMAL LOW (ref 12.0–15.0)
LYMPHS PCT: 3 %
Lymphs Abs: 0.3 10*3/uL — ABNORMAL LOW (ref 0.7–4.0)
MCH: 27.8 pg (ref 26.0–34.0)
MCHC: 31 g/dL (ref 30.0–36.0)
MCV: 89.6 fL (ref 78.0–100.0)
MONO ABS: 0.5 10*3/uL (ref 0.1–1.0)
Monocytes Relative: 5 %
Neutro Abs: 9.1 10*3/uL — ABNORMAL HIGH (ref 1.7–7.7)
Neutrophils Relative %: 92 %
PLATELETS: 102 10*3/uL — AB (ref 150–400)
RBC: 2.7 MIL/uL — ABNORMAL LOW (ref 3.87–5.11)
RDW: 20.8 % — AB (ref 11.5–15.5)
WBC: 10 10*3/uL (ref 4.0–10.5)

## 2016-01-26 LAB — I-STAT CHEM 8, ED
BUN: 36 mg/dL — AB (ref 6–20)
CALCIUM ION: 1.06 mmol/L — AB (ref 1.13–1.30)
CHLORIDE: 99 mmol/L — AB (ref 101–111)
CREATININE: 1.5 mg/dL — AB (ref 0.44–1.00)
GLUCOSE: 208 mg/dL — AB (ref 65–99)
HCT: 24 % — ABNORMAL LOW (ref 36.0–46.0)
Hemoglobin: 8.2 g/dL — ABNORMAL LOW (ref 12.0–15.0)
POTASSIUM: 5.6 mmol/L — AB (ref 3.5–5.1)
Sodium: 137 mmol/L (ref 135–145)
TCO2: 27 mmol/L (ref 0–100)

## 2016-01-26 LAB — URINALYSIS, ROUTINE W REFLEX MICROSCOPIC
BILIRUBIN URINE: NEGATIVE
GLUCOSE, UA: NEGATIVE mg/dL
HGB URINE DIPSTICK: NEGATIVE
Ketones, ur: NEGATIVE mg/dL
Nitrite: NEGATIVE
PH: 5.5 (ref 5.0–8.0)
Protein, ur: NEGATIVE mg/dL
SPECIFIC GRAVITY, URINE: 1.022 (ref 1.005–1.030)

## 2016-01-26 LAB — RETICULOCYTES: RBC.: 2.61 MIL/uL — ABNORMAL LOW (ref 3.87–5.11)

## 2016-01-26 LAB — I-STAT TROPONIN, ED: TROPONIN I, POC: 0.01 ng/mL (ref 0.00–0.08)

## 2016-01-26 LAB — PREPARE RBC (CROSSMATCH)

## 2016-01-26 LAB — IRON AND TIBC
Iron: 53 ug/dL (ref 28–170)
SATURATION RATIOS: 23 % (ref 10.4–31.8)
TIBC: 227 ug/dL — AB (ref 250–450)
UIBC: 174 ug/dL

## 2016-01-26 LAB — URINE MICROSCOPIC-ADD ON

## 2016-01-26 LAB — FERRITIN: Ferritin: 409 ng/mL — ABNORMAL HIGH (ref 11–307)

## 2016-01-26 LAB — TSH: TSH: 2.737 u[IU]/mL (ref 0.350–4.500)

## 2016-01-26 LAB — MRSA PCR SCREENING: MRSA BY PCR: NEGATIVE

## 2016-01-26 LAB — VITAMIN B12: VITAMIN B 12: 340 pg/mL (ref 180–914)

## 2016-01-26 MED ORDER — NITROGLYCERIN 0.4 MG SL SUBL
0.4000 mg | SUBLINGUAL_TABLET | SUBLINGUAL | Status: DC | PRN
  Administered 2016-01-26: 0.4 mg via SUBLINGUAL
  Filled 2016-01-26: qty 1

## 2016-01-26 MED ORDER — LEVOFLOXACIN 750 MG PO TABS: 750.0000 mg | ORAL_TABLET | ORAL | Status: DC

## 2016-01-26 MED ORDER — SODIUM CHLORIDE 0.9 % IV BOLUS (SEPSIS)
500.0000 mL | Freq: Once | INTRAVENOUS | Status: AC
  Administered 2016-01-26: 500 mL via INTRAVENOUS

## 2016-01-26 MED ORDER — PREDNISONE 20 MG PO TABS
10.0000 mg | ORAL_TABLET | Freq: Every day | ORAL | Status: DC
  Administered 2016-01-27: 10 mg via ORAL
  Filled 2016-01-26: qty 1

## 2016-01-26 MED ORDER — INSULIN ASPART 100 UNIT/ML ~~LOC~~ SOLN
0.0000 [IU] | Freq: Every day | SUBCUTANEOUS | Status: DC
  Administered 2016-01-27: 4 [IU] via SUBCUTANEOUS

## 2016-01-26 MED ORDER — GUAIFENESIN ER 600 MG PO TB12
600.0000 mg | ORAL_TABLET | Freq: Two times a day (BID) | ORAL | Status: DC
  Administered 2016-01-27 (×2): 600 mg via ORAL
  Filled 2016-01-26 (×2): qty 1

## 2016-01-26 MED ORDER — SODIUM CHLORIDE 0.9 % IV SOLN: Freq: Once | INTRAVENOUS | Status: DC

## 2016-01-26 MED ORDER — OXYCODONE HCL 5 MG PO TABS
5.0000 mg | ORAL_TABLET | Freq: Two times a day (BID) | ORAL | Status: DC | PRN
  Administered 2016-01-27: 5 mg via ORAL
  Filled 2016-01-26: qty 1

## 2016-01-26 MED ORDER — PRAVASTATIN SODIUM 40 MG PO TABS: 80.0000 mg | ORAL_TABLET | Freq: Every evening | ORAL | Status: DC

## 2016-01-26 MED ORDER — DICLOFENAC SODIUM 1 % TD GEL
2.0000 g | Freq: Four times a day (QID) | TRANSDERMAL | Status: DC
  Administered 2016-01-27 (×2): 2 g via TOPICAL
  Filled 2016-01-26: qty 100

## 2016-01-26 MED ORDER — IOPAMIDOL (ISOVUE-300) INJECTION 61%
INTRAVENOUS | Status: AC
  Administered 2016-01-26: 75 mL
  Filled 2016-01-26: qty 75

## 2016-01-26 MED ORDER — MORPHINE SULFATE ER 30 MG PO TBCR
60.0000 mg | EXTENDED_RELEASE_TABLET | Freq: Three times a day (TID) | ORAL | Status: DC
  Administered 2016-01-26 – 2016-01-27 (×3): 60 mg via ORAL
  Filled 2016-01-26 (×3): qty 2

## 2016-01-26 MED ORDER — GABAPENTIN 100 MG PO CAPS
100.0000 mg | ORAL_CAPSULE | Freq: Three times a day (TID) | ORAL | Status: DC
  Administered 2016-01-26 – 2016-01-27 (×2): 100 mg via ORAL
  Filled 2016-01-26 (×2): qty 1

## 2016-01-26 MED ORDER — DOFETILIDE 125 MCG PO CAPS
125.0000 ug | ORAL_CAPSULE | Freq: Two times a day (BID) | ORAL | Status: DC
Start: 2016-01-26 — End: 2016-01-27
  Administered 2016-01-26 – 2016-01-27 (×2): 125 ug via ORAL
  Filled 2016-01-26 (×3): qty 1

## 2016-01-26 MED ORDER — BISOPROLOL FUMARATE 5 MG PO TABS
5.0000 mg | ORAL_TABLET | Freq: Every day | ORAL | Status: DC
  Administered 2016-01-27: 5 mg via ORAL
  Filled 2016-01-26: qty 1

## 2016-01-26 MED ORDER — VITAMIN C 500 MG PO TABS
1000.0000 mg | ORAL_TABLET | Freq: Two times a day (BID) | ORAL | Status: DC
  Administered 2016-01-27: 1000 mg via ORAL
  Filled 2016-01-26 (×2): qty 2

## 2016-01-26 MED ORDER — ADULT MULTIVITAMIN W/MINERALS CH
1.0000 | ORAL_TABLET | Freq: Every day | ORAL | Status: DC
Start: 2016-01-27 — End: 2016-01-27
  Administered 2016-01-27: 1 via ORAL
  Filled 2016-01-26: qty 1

## 2016-01-26 MED ORDER — INSULIN ASPART 100 UNIT/ML ~~LOC~~ SOLN
0.0000 [IU] | Freq: Three times a day (TID) | SUBCUTANEOUS | Status: DC
Start: 2016-01-27 — End: 2016-01-27
  Administered 2016-01-27: 4 [IU] via SUBCUTANEOUS
  Administered 2016-01-27: 3 [IU] via SUBCUTANEOUS

## 2016-01-26 MED ORDER — POLYVINYL ALCOHOL 1.4 % OP SOLN
1.0000 [drp] | Freq: Every day | OPHTHALMIC | Status: DC | PRN
  Filled 2016-01-26: qty 15

## 2016-01-26 MED ORDER — LOSARTAN POTASSIUM 25 MG PO TABS: 25.0000 mg | ORAL_TABLET | Freq: Every day | ORAL | Status: DC

## 2016-01-26 MED ORDER — PANTOPRAZOLE SODIUM 40 MG PO TBEC
40.0000 mg | DELAYED_RELEASE_TABLET | Freq: Every day | ORAL | Status: DC
Start: 2016-01-27 — End: 2016-01-27
  Administered 2016-01-27: 40 mg via ORAL
  Filled 2016-01-26: qty 1

## 2016-01-26 MED ORDER — MAGNESIUM OXIDE 400 (241.3 MG) MG PO TABS
400.0000 mg | ORAL_TABLET | Freq: Every day | ORAL | Status: DC
  Administered 2016-01-27: 400 mg via ORAL
  Filled 2016-01-26: qty 1

## 2016-01-26 MED ORDER — SODIUM CHLORIDE 0.9 % IV SOLN
Freq: Once | INTRAVENOUS | Status: AC
  Administered 2016-01-26: 15:00:00 via INTRAVENOUS

## 2016-01-26 MED ORDER — HYDROCORTISONE NA SUCCINATE PF 100 MG IJ SOLR
100.0000 mg | Freq: Once | INTRAMUSCULAR | Status: AC
  Administered 2016-01-26: 100 mg via INTRAVENOUS
  Filled 2016-01-26: qty 2

## 2016-01-26 MED ORDER — GLUCERNA SHAKE PO LIQD: 237.0000 mL | Freq: Two times a day (BID) | ORAL | Status: DC

## 2016-01-26 MED ORDER — APIXABAN 2.5 MG PO TABS
2.5000 mg | ORAL_TABLET | Freq: Two times a day (BID) | ORAL | Status: DC
  Administered 2016-01-26 – 2016-01-27 (×2): 2.5 mg via ORAL
  Filled 2016-01-26 (×2): qty 1

## 2016-01-26 NOTE — H&P (Signed)
Date: 01/26/2016               Patient Name:  Cynthia Hicks MRN: [id number]  DOB: 18-Apr-1933 Age / Sex: 82 y.o., female   PCP: Raelyn Number, MD         Medical Service: Internal Medicine Teaching Service         Attending Physician: Dr. Axel Filler, MD    First Contact: Dr. Jule Ser Pager: [phone]  Second Contact: Dr. Julious Oka Pager: [phone]       After Hours (After 5p/  First Contact Pager: [phone]  weekends / holidays): Second Contact Pager: [phone]   Chief Complaint: weak and tired since discharge to SNF with abdominal pain  History of Present Illness: Cynthia Hicks is 82 y.o. woman with an extensive past medical history that includes autoimmune hepatitis on chronic steroids, lower extremity lymphedema, recent Pseudomonal septic arthritis currently on Levaquin, CKD III, atrial fibrillation (on Eliquis and Tikosyn), chronic back pain, type 2 diabetes, coronary artery disease (s/p RCA, LCx DES) who comes in today from her SNF due to lower abdominal pain as well as feeling weak and tired since being discharged to her SNF.  States her abdominal pain began shortly after her discharge and has coincided with her weakness.  Initially at discharge she had a decreased appetite for a few days but states that it is now better.  Pain is located in the RLQ/hip area and started since discharge.  She does not think it is worse with movement and she denies any trauma.  She is unsure of any triggers to her pain.  States it waxes and wanes.  Described as achy and intermittently radiates to her left side.  Rates as 3/10 currently, but 8/10 at its worst.  She has been working with PT at her rehab facility but has been having difficulty due to her weakness.  Additionally, she reports diarrhea 2-3 times per day since starting Levaquin that she describes as loose.    In the ED, patient had hemoglobin of 7.5 on CBC with i-Stat of 8.2 on recheck.  She has had a chronic anemia dating  back to 2010.  Admitted 06/08/2015 - 06/13/2015 with hemoglobin ranging 8.5-10.6.  Admitted 01/06/2016-01/10/2016 with hemoglobin ranging 9.7-10.2.  Seen in the ED on 01/19/2016 for chest pain with hemoglobin 8.8 and discharged back to her SNF.  She was FOBT negative and CT showed moderate stool within right colon and cecum.  Mild distended small bowel loops in right lower quadrant terminal small bowel suspicious for ileus or early SBO.  It also showed extensive degenerative changes of the lumbar spine and anasarca infiltration of subcutaneous fat bilateral flank and pelvic wall.  On review of systems, she denies nausea or vomiting, chest pain, dysuria, hematuria, hematemesis, new bruising, or syncope.  She reports feeling lightheaded this morning, diarrhea as noted above and weakness/fatigue exacerbated by movement.  She is unsure if she has had melena or blood in her stool.    Meds: Current Facility-Administered Medications  Medication Dose Route Frequency Provider Last Rate Last Dose  . 0.9 %  sodium chloride infusion   Intravenous Once Norman Herrlich, MD      . apixaban [facility]) tablet 2.5 mg  2.5 mg Oral BID Norman Herrlich, MD      . bisoprolol (ZEBETA) tablet 5 mg  5 mg Oral Daily Norman Herrlich, MD      . dofetilide [facility]) capsule 125 mcg  125 mcg Oral BID Norman Herrlich, MD      . Derrill Memo ON 01/27/2016] feeding supplement (GLUCERNA SHAKE) (GLUCERNA SHAKE) liquid 237 mL  237 mL Oral BID BM Norman Herrlich, MD      . gabapentin (NEURONTIN) capsule 100 mg  100 mg Oral TID Norman Herrlich, MD      . Derrill Memo ON 01/27/2016] levofloxacin (LEVAQUIN) tablet 750 mg  750 mg Oral Q48H Norman Herrlich, MD      . magnesium oxide (MAG-OX) tablet 400 mg  400 mg Oral Daily Norman Herrlich, MD      . morphine (MS CONTIN) 12 hr tablet 60 mg  60 mg Oral Q8H Norman Herrlich, MD      . multivitamin tablet 1 tablet  1 tablet Oral Daily Norman Herrlich, MD      . nitroGLYCERIN (NITROSTAT) SL tablet 0.4 mg  0.4 mg Sublingual  Q5 min PRN Norman Herrlich, MD      . oxyCODONE (Oxy IR/ROXICODONE) immediate release tablet 5 mg  5 mg Oral BID PRN Norman Herrlich, MD      . Derrill Memo ON 01/27/2016] pantoprazole (PROTONIX) EC tablet 40 mg  40 mg Oral Daily Norman Herrlich, MD      . polyvinyl alcohol (LIQUIFILM TEARS) 1.4 % ophthalmic solution 1 drop  1 drop Both Eyes Daily PRN Norman Herrlich, MD      . pravastatin (PRAVACHOL) tablet 80 mg  80 mg Oral QPM Norman Herrlich, MD      . Derrill Memo ON 01/27/2016] predniSONE (DELTASONE) tablet 10 mg  10 mg Oral Daily Norman Herrlich, MD      . vitamin C (ASCORBIC ACID) tablet 1,000 mg  1,000 mg Oral BID Norman Herrlich, MD       Current Outpatient Prescriptions  Medication Sig Dispense Refill  . acetaminophen (TYLENOL) 325 MG tablet Take 2 tablets (650 mg total) by mouth every 6 (six) hours as needed for mild pain (or Fever >/= 101). 30 tablet 3  . alendronate (FOSAMAX) 70 MG tablet Take 70 mg by mouth every Monday. Take with a full glass of water on an empty stomach.    Marland Kitchen apixaban (ELIQUIS) 2.5 MG TABS tablet Take 1 tablet (2.5 mg total) by mouth 2 (two) times daily. 180 tablet 3  . Ascorbic Acid (VITAMIN C) 1000 MG tablet Take 1,000 mg by mouth 2 (two) times daily.     . bisoprolol (ZEBETA) 5 MG tablet Take 1 tablet (5 mg total) by mouth daily. 90 tablet 3  . Cholecalciferol (VITAMIN D PO) Take 2,000 Units by mouth at bedtime.     . clotrimazole (MYCELEX) 10 MG troche Take 10 mg by mouth 5 (five) times daily.    Marland Kitchen dofetilide (TIKOSYN) 125 MCG capsule Take 1 capsule (125 mcg total) by mouth 2 (two) times daily. 180 capsule 3  . feeding supplement, GLUCERNA SHAKE, (GLUCERNA SHAKE) LIQD Take 237 mLs by mouth 2 (two) times daily between meals. 36 Can 5  . gabapentin (NEURONTIN) 100 MG capsule Take 100 mg by mouth 3 (three) times daily.    . Insulin Regular Human (RELION R IJ) Inject 4-16 Units as directed 3 (three) times daily. BS 120-200=4 units, 201-250=6 units, 251-300=8 units, 301-350=10 units,  351-400=14 units, 401-450=16 units.    Marland Kitchen levofloxacin (LEVAQUIN) 750 MG tablet Take 1 tablet (750 mg total) by mouth every other day. Last dose on 01/31/16. 11 tablet 0  . losartan (COZAAR)  25 MG tablet TAKE 1 TABLET BY MOUTH EVERY DAY 90 tablet 0  . magnesium oxide (MAG-OX) 400 (241.3 MG) MG tablet Take 1 tablet (400 mg total) by mouth daily. 30 tablet 11  . morphine (MS CONTIN) 60 MG 12 hr tablet Take 1 tablet (60 mg total) by mouth every 8 (eight) hours. 90 tablet 0  . Multiple Vitamin (MULTIVITAMIN) tablet Take 1 tablet by mouth daily.    . nitroGLYCERIN (NITROSTAT) 0.4 MG SL tablet Place 1 tablet (0.4 mg total) under the tongue every 5 (five) minutes as needed for chest pain. 25 tablet 3  . omeprazole (PRILOSEC) 20 MG capsule Take 20 mg by mouth daily.     Marland Kitchen oxyCODONE (OXY IR/ROXICODONE) 5 MG immediate release tablet Take 1 tablet (5 mg total) by mouth 2 (two) times daily as needed for moderate pain. (Patient taking differently: Take 5 mg by mouth 2 (two) times daily. ) 60 tablet 0  . polyvinyl alcohol (LIQUIFILM TEARS) 1.4 % ophthalmic solution Place 1 drop into both eyes daily as needed for dry eyes.    . pravastatin (PRAVACHOL) 80 MG tablet Take 1 tablet (80 mg total) by mouth every evening. 90 tablet 3  . predniSONE (DELTASONE) 10 MG tablet Take 10 mg by mouth daily. Patient started on a 70 mg dose taper on 12-31-15    . diclofenac sodium (VOLTAREN) 1 % GEL Apply 2 g topically 4 (four) times daily. (Patient not taking: Reported on 01/19/2016) 1 Tube 3    Allergies: Allergies as of 01/26/2016 - Review Complete 01/26/2016  Allergen Reaction Noted  . Cyclosporine  06/18/2014  . Azathioprine  03/03/2014  . Mycophenolate Other (See Comments) 06/23/2014  . Other  03/03/2014  . Oysters [shellfish allergy] Nausea And Vomiting 10/19/2014   Past Medical History  Diagnosis Date  . Hypertension   . Arthritis   . Diabetes mellitus   . Coronary artery disease     a.  MI 01/2003;  b.  02/2006  NSTEMI - LAD 80-68m, LCX nl, RCA 80m - RCA stented w/ Taxus DES;   c.  2009 Neg MV;  d. 11/2010 Echo - EF 65%;  e. 02/2013 MV: Carlton Adam Cardiolite that was low risk with no ischemia.  . Autoimmune liver disease     a.  Followed @ [facility] - on chronic prednisone and  sirolimus  . Hyperlipidemia     a. inability to stake statins due to liver disease  . Cellulitis     a. chronic cellulitis of LE  . CKD (chronic kidney disease), stage IV ([place])   . Bulging disc   . Sciatica   . Chronic back pain     a.  On Methadone  . Skin cancer (melanoma) ([place])   . Esophageal stricture     narrowing  . Diabetic neuropathy ([place])   . Sepsis ([place])   . PAF (paroxysmal atrial fibrillation) ([place])     a. Tikosyn initiated 10/2014;  CHA2DS2VASc = 6 (eliquis).  . Chronic diastolic CHF (congestive heart failure) ([place])     a. 06/2014 TEE: EF 60-65%, Gr 2 DD, mild MR, mildly dil LA, Grade IV descending thoracic Ao plaque.  Marland Kitchen PAD (peripheral artery disease) ([place])   . Chronic acquired lymphedema    Past Surgical History  Procedure Laterality Date  . Cardiac catheterization      Normal EF  . Coronary angioplasty  2007    2 DES placement to RCA complicated by a wire perforation  . Tubal ligation  1982  . Elbow surgery Left 1994    neuroma removed   . Abscess drainage Right     side  . Lamenectomy  1989    only 1 disc surgery  . Heart stents  2007  and  2012    X's 1  and  2012 X's 2 stents  . Cardioversion N/A 10/31/2012    Procedure: CARDIOVERSION;  Surgeon: Larey Dresser, MD;  Location: [facility] ENDOSCOPY;  Service: Cardiovascular;  Laterality: N/A;  . Tee without cardioversion N/A 06/22/2014    Procedure: TRANSESOPHAGEAL ECHOCARDIOGRAM (TEE);  Surgeon: Larey Dresser, MD;  Location: [facility] ENDOSCOPY;  Service: Cardiovascular;  Laterality: N/A;  . Left heart catheterization with coronary angiogram N/A 08/29/2011    Procedure: LEFT HEART CATHETERIZATION WITH CORONARY ANGIOGRAM;  Surgeon: Wellington Hampshire, MD;  Location:  [place] CATH LAB;  Service: Cardiovascular;  Laterality: N/A;  . Percutaneous coronary stent intervention (pci-s)  08/29/2011    Procedure: PERCUTANEOUS CORONARY STENT INTERVENTION (PCI-S);  Surgeon: Wellington Hampshire, MD;  Location: [facility] CATH LAB;  Service: Cardiovascular;;  . Knee arthroscopy Bilateral 01/08/2016    Procedure: ARTHROSCOPY KNEE;  Surgeon: Newt Minion, MD;  Location: [place];  Service: Orthopedics;  Laterality: Bilateral;   Family History  Problem Relation Age of Onset  . Lung cancer Mother 40  . Heart attack Father 63  . Heart disease Father   . Congestive Heart Failure Father   . Heart disease Maternal Grandfather   . Hypertension Brother   . Hypertension Son   . Pancreatic cancer Mother    Social History   Social History  . Marital Status: Married    Spouse Name: Jenny Reichmann  . Number of Children: 2  . Years of Education: N/A   Occupational History  . real Sport and exercise psychologist     retired   Social History Main Topics  . Smoking status: Never Smoker   . Smokeless tobacco: Never Used  . Alcohol Use: No  . Drug Use: No  . Sexual Activity: Not on file   Other Topics Concern  . Not on file   Social History Narrative   Pt lives in [place] with husband.  She is retired.  She is not very active @ home.    Review of Systems: Pertinent items are noted in HPI.  Physical Exam: Blood pressure 95/61, pulse 81, temperature 97.5 F (36.4 C), temperature source Oral, resp. rate 18, SpO2 100 %. General: frail, elderly woman lying in bed, tired and chronically ill-appearing HEENT: pale conjunctiva, EOMI, no scleral icterus Cardiac: regular rhythm, regular rate, no murmurs Pulm: clear to auscultation bilaterally, moving normal volumes of air Abd: soft, mildly tender to palpation of RLQ and right hip/groin area, no rebound or guarding, nondistended, BS present Ext: erythematous lower extremities with mild edema that is chronic per patient.  Also areas of ecchymosis of her bilateral upper  extremities.  Bilateral knees with healing post-operative scars and no significant swelling Neuro: alert and oriented X3, cranial nerves II-XII grossly intact, no focal deficits   Lab results: Basic Metabolic Panel:  Recent Labs  01/26/16 1401 01/26/16 1412  NA 135 137  K 5.6* 5.6*  CL 102 99*  CO2 26  --   GLUCOSE 217* 208*  BUN 40* 36*  CREATININE 1.39* 1.50*  CALCIUM 8.1*  --    Liver Function Tests:  Recent Labs  01/26/16 1401  AST 13*  ALT 12*  ALKPHOS 68  BILITOT 0.6  PROT 4.6*  ALBUMIN 2.3*  CBC:  Recent Labs  01/26/16 1401 01/26/16 1412  WBC 10.0  --   NEUTROABS 9.1*  --   HGB 7.5* 8.2*  HCT 24.2* 24.0*  MCV 89.6  --   PLT 102*  --    Anemia Panel:  Recent Labs  01/26/16 1613  VITAMINB12 340  FERRITIN 409*  TIBC 227*  IRON 53  RETICCTPCT <0.4*    Imaging results:  Ct Abdomen Pelvis W Contrast  01/26/2016  CLINICAL DATA:  Right lower quadrant pain EXAM: CT ABDOMEN AND PELVIS WITH CONTRAST TECHNIQUE: Multidetector CT imaging of the abdomen and pelvis was performed using the standard protocol following bolus administration of intravenous contrast. CONTRAST:  25mL ISOVUE-300 IOPAMIDOL (ISOVUE-300) INJECTION 61% COMPARISON:  CT scan 05/02/2015 FINDINGS: Lower chest: There is small right pleural effusion with right base posterior atelectasis. Atherosclerotic calcifications of coronary arteries are noted. Cardiomegaly is noted. Hepatobiliary: Again noted intrahepatic pneumobilia especially in left hepatic lobe. Small amount of air within gallbladder again noted. A gallstone is noted within gallbladder measures 1.3 cm. There is no thickening of gallbladder wall or pericholecystic fluid. CBD measures 1.1 cm in diameter distally. Pancreas: Mild pancreatic atrophy again noted. Spleen: Enhanced spleen is unremarkable. Punctate calcifications within spleen from prior granulomatous disease. Adrenals/Urinary Tract: No adrenal gland mass. Enhanced kidneys are  symmetrical in size. No hydronephrosis or hydroureter. No nephrolithiasis. No calcified ureteral calculi. Delayed renal images shows bilateral renal symmetrical excretion. Bilateral visualized proximal ureter is unremarkable. Stomach/Bowel: No gastric outlet obstruction. Moderate stool noted in right colon and cecum. No pericecal inflammation. Normal appendix containing air is noted in axial image 63. There is fecal like material within terminal ileum. Mild fluid and gas distended small bowel loop in distal small bowel in right lower quadrant. Findings suspicious for ileus, incompetent ileocecal valve or early bowel obstruction. Clinical correlation is necessary. Vascular/Lymphatic: Extensive atherosclerotic calcifications of abdominal aorta. Extensive atherosclerotic calcifications of splenic artery, atherosclerotic calcification bilateral proximal renal artery. Extensive atherosclerotic calcifications bilateral common iliac arteries. SMA calcifications are noted. There is no aortic aneurysm. Reproductive: The uterus is atrophic.  No adnexal mass. Other: No ascites or free abdominal air. The urinary bladder is unremarkable. There is mild anasarca infiltration subcutaneous fat flank wall and pelvic wall. Musculoskeletal: Sagittal images of the spine shows diffuse osteopenia. Again noted significant compression deformity T12 vertebral body. Stable mild compression fracture lower endplate of T3. Schmorl's node deformity lower endplate of T2 vertebral body. Multilevel disc space flattening with vacuum disc phenomenon. IMPRESSION: 1. There is small right pleural effusion with right base posterior atelectasis. Again noted intrahepatic pneumobilia. Gallstone within gallbladder measures 1.3 cm. No evidence of pericholecystic fluid or thickening of gallbladder wall. 2. Moderate stool noted within right colon and cecum. Normal appendix. No pericecal inflammation. There is fecal like material within terminal ileum. Mild  distended small bowel loops in right lower quadrant terminal small bowel please see axial image 69. Findings suspicious for ileus, early bowel obstruction or incompetent ileocecal valve. Clinical correlation is necessary. 3. No ascites or free air. 4. No hydronephrosis or hydroureter. 5. Stable compression fractures of T12 and L3 vertebral body. Extensive degenerative changes lumbar spine again noted. In the 6. There is anasarca infiltration of subcutaneous fat bilateral flank and pelvic wall. Electronically Signed   By: Lahoma Crocker M.D.   On: 01/26/2016 15:50   Dg Hip Unilat With Pelvis 2-3 Views Right  01/26/2016  CLINICAL DATA:  Chronic anterior right hip pain.  Initial encounter. EXAM: DG HIP (WITH  OR WITHOUT PELVIS) 2-3V RIGHT COMPARISON:  None. FINDINGS: There is no evidence of fracture or dislocation. Both femoral heads are seated normally within their respective acetabula. The proximal right femur appears intact. Mild degenerative change is noted along the lower lumbar spine. The sacroiliac joints are unremarkable in appearance. The visualized bowel gas pattern is grossly unremarkable in appearance. Scattered vascular calcifications are seen. IMPRESSION: 1. No evidence of fracture or dislocation. 2. Scattered vascular calcifications seen. Electronically Signed   By: Garald Balding M.D.   On: 01/26/2016 18:21    Other results: EKG: pending  Assessment & Plan by Problem: Active Problems:   Anemia  Symptomatic Anemia: chronic normocytic anemia dating back to 2010 that has worsened in the last few weeks and she currently is experiencing symptoms of weakness and fatigue.  Was noted to be hypotensive in the ED and started on fluids with noted improvement.  On exam she does have some conjunctival pallor but no chest pain or syncopal episodes.  Troponin in the ED was negative.  Likely has a component of anemia of chronic kidney disease but no ferritin in the EMR.  She receives Morphine TID and Oxycodone  PRN which could have contributed to her hypotension - iron 53 - ferritin elevated to 409 - B12 normal, folate pending - reticulocytes normal which is reassuring that she is not having an acute bleed.  Further hemolysis labs pending are haptoglobin, LDH - save smear - transfuse 1 unit RBC - EKG - repeat CBC in the morning  RLQ Abdominal Pain: she complains of RLQ pain but only has mild tenderness to palpation and symptoms began shortly after discharge to SNF and beginning rehab.  On exam, it is difficult to ascertain if her pain is more abdominal vs MSK-related and involving her hip.  CT scan was notable for a moderate amount of stool in the right colon and cecum despite her report of daily diarrhea at her SNF - check hip x-ray - check C. Diff - if C. Diff negative, consider enema to clear her out  Hyperkalemia: 5.6 on admission - check EKG - repeat BMET in morning if no EKG changes - keep potassium above 4 on Tikosyn  Recent Pseudomonas Septic Arthritis: she is s/p knee washout bilaterally by orthopedic surgery and currently on Levaquin 750mg  Q48H.  Currently afebrile and without leukocytosis.  She reports no pain in her knees and has been doing well since surgery. - continue Levaquin through 01/31/16  Atrial Fibrillation (CHADS-Vasc 6): Patient on chronic anticoagulation  - Dofetilide 125 mcg BID - Bisoprolol 5 mg daily - Continue Eliquis 2.5mg  daily  CKD III: stable  DM2: Takes Regular Insulin 8U breakfast and lunch, 12-15U at dinner on a sliding scale. - Resistant SSI  HTN - hold Losartan 25 mg daily  Autoimmune Hepatitis: Has been on chronic steroids for many years. - Prednisone 10 mg daily  Chronic Back Pain: On chronic pain regimen - Morphine 60 mg 12 hr q8h prn - Oxycodone 5 mg BID as needed  DVT PPx: eliquis  Diet: Carb mod  Code: FULL   Dispo: Disposition is deferred at this time, awaiting improvement of current medical problems. Anticipated discharge in  approximately 1-2 day(s).   The patient does have a current PCP (Imran Gertie Baron, MD) and does not need an [facility] hospital follow-up appointment after discharge.  The patient does not have transportation limitations that hinder transportation to clinic appointments.  Signed: Jule Ser, DO 01/26/2016, 6:27 PM

## 2016-01-26 NOTE — Progress Notes (Signed)
CRITICAL VALUE ALERT  Critical value received:  hgb 6.9  Date of notification:  6/22  Time of notification:  2137  Critical value read back:Yes.    Nurse who received alert:  Alphia Kava  MD notified (1st page):  Teaching service  Time of first page:  2220  Responding MD:  Teaching service  Time MD responded:  2224

## 2016-01-26 NOTE — ED Notes (Signed)
Pt arrives from [facility] via [place]. Pt has c/o diffuse lower abdominal pain. Pt was given an oxycodone before leaving the facility, although pt states it didn't help much bc she sees a pain doctor and gets morphine 3 times a day and its due again at 4. Facility reports pt was hypotensive. Pt states she was released from the hospital 2 weeks ago and was put on antibiotics after having bilateral knee surgery. Pt is reporting diarrhea since being d/c from the hospital and being placed into [facility]. Pt denies bladder changes.

## 2016-01-26 NOTE — Progress Notes (Signed)
Patient trasfered from ED to 5W24 via stretcher; alert and oriented x 4; no complaints of pain (pt received pain medication in ED); IV saline locked in LAC. Orient patient to room and unit; gave patient care guide; instructed how to use the call bell and  fall risk precautions. Will continue to monitor the patient.

## 2016-01-26 NOTE — ED Provider Notes (Addendum)
CSN: [id number]     Arrival date & time 01/26/16  1306 History   First MD Initiated Contact with Patient 01/26/16 1329     Chief Complaint  Patient presents with  . Abdominal Pain      HPI  She presents for evaluation of abdominal pain, And progressive generalized weakness.  She resides at [facility] rehabilitation facility. Developed abdominal pain yesterday worsened and was located in the right lower quadrant this morning. States that she has been at the rehabilitation facility since the discharge from the hospital 2 weeks ago and had progressive weakness the point she was barely able to sit today and let alone stand.  Significant history includes recent hospitalization from 6/2 to 6/6.  Had lower extremity rash and fever. Had aspiration of both knees and found to have bilateral septic arthritis with gram-negative rods, ultimately growing pan sensitive pseudomonas. Seen by ID. Was discharged on Levaquin to continue daily Levaquin through 6/27.  Patient states she had a history of anemia in the past at one point had to get "for shots". She states that she never had any ongoing treatment for anemia and does not know the type of anemia she had. In review of her chart history op from a hemoglobin of 11.6 in September last year to 7.5 today. Denies any hematemesis. Has intermittent small nosebleeds. No bright red blood per rectum or melena.  Hb values:  9/30: 11.6  11/2-11/5 Admit: 8.5 to 11.5  6/2 to 6/6 Admit:  9.1 to 10.1  Today:  7.5   Patient also has a history of autoimmune hepatitis on chronic steroid treatment.  Currently at 10 mg prednisone daily. Is not tolerated other immune suppressed as in the past. Also has chronic bilateral lower extremity lymphedema. She relates this to a prior pelvic abscess that required cutaneous drain was left in place over several months. She attributes her right greater than left lower extremity edema to her previous pelvic infection.  Patient  states that she has had progressive weakness since going to the rehabilitation facility. Has had diarrhea for the last several days once per day not copious. Soft but not frankly loose or watery. Denies any blood in stools.  Past Medical History  Diagnosis Date  . Hypertension   . Arthritis   . Diabetes mellitus   . Coronary artery disease     a.  MI 01/2003;  b.  02/2006 NSTEMI - LAD 80-65m, LCX nl, RCA 43m - RCA stented w/ Taxus DES;   c.  2009 Neg MV;  d. 11/2010 Echo - EF 65%;  e. 02/2013 MV: Carlton Adam Cardiolite that was low risk with no ischemia.  . Autoimmune liver disease     a.  Followed @ [facility] - on chronic prednisone and  sirolimus  . Hyperlipidemia     a. inability to stake statins due to liver disease  . Cellulitis     a. chronic cellulitis of LE  . CKD (chronic kidney disease), stage IV ([place])   . Bulging disc   . Sciatica   . Chronic back pain     a.  On Methadone  . Skin cancer (melanoma) ([place])   . Esophageal stricture     narrowing  . Diabetic neuropathy ([place])   . Sepsis ([place])   . PAF (paroxysmal atrial fibrillation) ([place])     a. Tikosyn initiated 10/2014;  CHA2DS2VASc = 6 (eliquis).  . Chronic diastolic CHF (congestive heart failure) ([place])     a. 06/2014 TEE: EF  60-65%, Gr 2 DD, mild MR, mildly dil LA, Grade IV descending thoracic Ao plaque.  Marland Kitchen PAD (peripheral artery disease) ([place])   . Chronic acquired lymphedema    Past Surgical History  Procedure Laterality Date  . Cardiac catheterization      Normal EF  . Coronary angioplasty  2007    2 DES placement to RCA complicated by a wire perforation  . Tubal ligation  1982  . Elbow surgery Left 1994    neuroma removed   . Abscess drainage Right     side  . Lamenectomy  1989    only 1 disc surgery  . Heart stents  2007  and  2012    X's 1  and  2012 X's 2 stents  . Cardioversion N/A 10/31/2012    Procedure: CARDIOVERSION;  Surgeon: Larey Dresser, MD;  Location: [facility] ENDOSCOPY;  Service: Cardiovascular;  Laterality:  N/A;  . Tee without cardioversion N/A 06/22/2014    Procedure: TRANSESOPHAGEAL ECHOCARDIOGRAM (TEE);  Surgeon: Larey Dresser, MD;  Location: [facility] ENDOSCOPY;  Service: Cardiovascular;  Laterality: N/A;  . Left heart catheterization with coronary angiogram N/A 08/29/2011    Procedure: LEFT HEART CATHETERIZATION WITH CORONARY ANGIOGRAM;  Surgeon: Wellington Hampshire, MD;  Location: [place] CATH LAB;  Service: Cardiovascular;  Laterality: N/A;  . Percutaneous coronary stent intervention (pci-s)  08/29/2011    Procedure: PERCUTANEOUS CORONARY STENT INTERVENTION (PCI-S);  Surgeon: Wellington Hampshire, MD;  Location: [facility] CATH LAB;  Service: Cardiovascular;;  . Knee arthroscopy Bilateral 01/08/2016    Procedure: ARTHROSCOPY KNEE;  Surgeon: Newt Minion, MD;  Location: [place];  Service: Orthopedics;  Laterality: Bilateral;   Family History  Problem Relation Age of Onset  . Lung cancer Mother 49  . Heart attack Father 3  . Heart disease Father   . Congestive Heart Failure Father   . Heart disease Maternal Grandfather   . Hypertension Brother   . Hypertension Son   . Pancreatic cancer Mother    Social History  Substance Use Topics  . Smoking status: Never Smoker   . Smokeless tobacco: Never Used  . Alcohol Use: No   OB History    No data available     Review of Systems  Constitutional: Negative for fever, chills, diaphoresis, appetite change and fatigue.  HENT: Negative for mouth sores, sore throat and trouble swallowing.   Eyes: Negative for visual disturbance.  Respiratory: Negative for cough, chest tightness, shortness of breath and wheezing.   Cardiovascular: Negative for chest pain.  Gastrointestinal: Positive for abdominal pain and diarrhea. Negative for nausea, vomiting and abdominal distention.  Endocrine: Negative for polydipsia, polyphagia and polyuria.  Genitourinary: Negative for dysuria, frequency and hematuria.  Musculoskeletal: Negative for gait problem.  Skin: Negative for color  change, pallor and rash.  Neurological: Positive for weakness. Negative for dizziness, syncope, light-headedness and headaches.  Hematological: Does not bruise/bleed easily.  Psychiatric/Behavioral: Negative for behavioral problems and confusion.      Allergies  Cyclosporine; Azathioprine; Mycophenolate; Other; and Oysters  Home Medications   Prior to Admission medications   Medication Sig Start Date End Date Taking? Authorizing Provider  acetaminophen (TYLENOL) 325 MG tablet Take 2 tablets (650 mg total) by mouth every 6 (six) hours as needed for mild pain (or Fever >/= 101). 01/10/16  Yes Liberty Handy, MD  alendronate (FOSAMAX) 70 MG tablet Take 70 mg by mouth every Monday. Take with a full glass of water on an empty stomach.   Yes Historical  Provider, MD  apixaban (ELIQUIS) 2.5 MG TABS tablet Take 1 tablet (2.5 mg total) by mouth 2 (two) times daily. 03/07/15  Yes Larey Dresser, MD  Ascorbic Acid (VITAMIN C) 1000 MG tablet Take 1,000 mg by mouth 2 (two) times daily.    Yes Historical Provider, MD  bisoprolol (ZEBETA) 5 MG tablet Take 1 tablet (5 mg total) by mouth daily. 05/06/15  Yes Larey Dresser, MD  Cholecalciferol (VITAMIN D PO) Take 2,000 Units by mouth at bedtime.    Yes Historical Provider, MD  clotrimazole (MYCELEX) 10 MG troche Take 10 mg by mouth 5 (five) times daily.   Yes Historical Provider, MD  dofetilide (TIKOSYN) 125 MCG capsule Take 1 capsule (125 mcg total) by mouth 2 (two) times daily. 12/16/15  Yes Larey Dresser, MD  feeding supplement, GLUCERNA SHAKE, (GLUCERNA SHAKE) LIQD Take 237 mLs by mouth 2 (two) times daily between meals. 01/10/16  Yes Liberty Handy, MD  gabapentin (NEURONTIN) 100 MG capsule Take 100 mg by mouth 3 (three) times daily.   Yes Historical Provider, MD  Insulin Regular Human (RELION R IJ) Inject 4-16 Units as directed 3 (three) times daily. BS 120-200=4 units, 201-250=6 units, 251-300=8 units, 301-350=10 units, 351-400=14 units, 401-450=16 units.   Yes  Historical Provider, MD  levofloxacin (LEVAQUIN) 750 MG tablet Take 1 tablet (750 mg total) by mouth every other day. Last dose on 01/31/16. 01/10/16 01/31/16 Yes Liberty Handy, MD  losartan (COZAAR) 25 MG tablet TAKE 1 TABLET BY MOUTH EVERY DAY 11/30/15  Yes Larey Dresser, MD  magnesium oxide (MAG-OX) 400 (241.3 MG) MG tablet Take 1 tablet (400 mg total) by mouth daily. 10/22/14  Yes Rhonda G Barrett, PA-C  morphine (MS CONTIN) 60 MG 12 hr tablet Take 1 tablet (60 mg total) by mouth every 8 (eight) hours. 01/10/16  Yes Liberty Handy, MD  Multiple Vitamin (MULTIVITAMIN) tablet Take 1 tablet by mouth daily.   Yes Historical Provider, MD  nitroGLYCERIN (NITROSTAT) 0.4 MG SL tablet Place 1 tablet (0.4 mg total) under the tongue every 5 (five) minutes as needed for chest pain. 09/01/11  Yes Rogelia Mire, NP  omeprazole (PRILOSEC) 20 MG capsule Take 20 mg by mouth daily.  06/06/14  Yes Historical Provider, MD  oxyCODONE (OXY IR/ROXICODONE) 5 MG immediate release tablet Take 1 tablet (5 mg total) by mouth 2 (two) times daily as needed for moderate pain. Patient taking differently: Take 5 mg by mouth 2 (two) times daily.  01/10/16  Yes Liberty Handy, MD  polyvinyl alcohol (LIQUIFILM TEARS) 1.4 % ophthalmic solution Place 1 drop into both eyes daily as needed for dry eyes.   Yes Historical Provider, MD  pravastatin (PRAVACHOL) 80 MG tablet Take 1 tablet (80 mg total) by mouth every evening. 10/11/14  Yes Larey Dresser, MD  predniSONE (DELTASONE) 10 MG tablet Take 10 mg by mouth daily. Patient started on a 70 mg dose taper on 12-31-15   Yes Historical Provider, MD  diclofenac sodium (VOLTAREN) 1 % GEL Apply 2 g topically 4 (four) times daily. Patient not taking: Reported on 01/19/2016 01/10/16   Liberty Handy, MD   BP 93/39 mmHg  Pulse 71  Temp(Src) 97.5 F (36.4 C) (Oral)  Resp 15  SpO2 97% Physical Exam  Constitutional: She is oriented to person, place, and time. She appears cachectic. No distress.  Does not  appear acutely ill.  HENT:  Head: Normocephalic.  Eyes: Conjunctivae are normal. Pupils are equal, round, and reactive to  light. No scleral icterus.  Conjunctiva are pale.  Neck: Normal range of motion. Neck supple. No thyromegaly present.  Cardiovascular: Normal rate and regular rhythm.  Exam reveals no gallop and no friction rub.   No murmur heard. Pulmonary/Chest: Effort normal and breath sounds normal. No respiratory distress. She has no wheezes. She has no rales.  Abdominal: Soft. Bowel sounds are normal. She exhibits no distension. There is no tenderness. There is no rebound.  Abdomen soft. She complains of pain in the right lower quadrant but has no reproducible tenderness.  Musculoskeletal: Normal range of motion.  Previous knee incisions appear well-healed. Full range of motion of the knees.  Neurological: She is alert and oriented to person, place, and time.  Skin: Skin is warm and dry. No rash noted.  Trace lower extremity edema to the bilateral lower extremity.  Right slightly greater than left.  Psychiatric: She has a normal mood and affect. Her behavior is normal.    ED Course  Procedures (including critical care time) Labs Review Labs Reviewed  CBC WITH DIFFERENTIAL/PLATELET - Abnormal; Notable for the following:    RBC 2.70 (*)    Hemoglobin 7.5 (*)    HCT 24.2 (*)    RDW 20.8 (*)    Platelets 102 (*)    Neutro Abs 9.1 (*)    Lymphs Abs 0.3 (*)    All other components within normal limits  COMPREHENSIVE METABOLIC PANEL - Abnormal; Notable for the following:    Potassium 5.6 (*)    Glucose, Bld 217 (*)    BUN 40 (*)    Creatinine, Ser 1.39 (*)    Calcium 8.1 (*)    Total Protein 4.6 (*)    Albumin 2.3 (*)    AST 13 (*)    ALT 12 (*)    GFR calc non Af Amer 35 (*)    GFR calc Af Amer 40 (*)    All other components within normal limits  I-STAT CHEM 8, ED - Abnormal; Notable for the following:    Potassium 5.6 (*)    Chloride 99 (*)    BUN 36 (*)     Creatinine, Ser 1.50 (*)    Glucose, Bld 208 (*)    Calcium, Ion 1.06 (*)    Hemoglobin 8.2 (*)    HCT 24.0 (*)    All other components within normal limits  URINE CULTURE  URINALYSIS, ROUTINE W REFLEX MICROSCOPIC (NOT AT [facility])  I-STAT TROPOININ, ED    Imaging Review Ct Abdomen Pelvis W Contrast  01/26/2016  CLINICAL DATA:  Right lower quadrant pain EXAM: CT ABDOMEN AND PELVIS WITH CONTRAST TECHNIQUE: Multidetector CT imaging of the abdomen and pelvis was performed using the standard protocol following bolus administration of intravenous contrast. CONTRAST:  35mL ISOVUE-300 IOPAMIDOL (ISOVUE-300) INJECTION 61% COMPARISON:  CT scan 05/02/2015 FINDINGS: Lower chest: There is small right pleural effusion with right base posterior atelectasis. Atherosclerotic calcifications of coronary arteries are noted. Cardiomegaly is noted. Hepatobiliary: Again noted intrahepatic pneumobilia especially in left hepatic lobe. Small amount of air within gallbladder again noted. A gallstone is noted within gallbladder measures 1.3 cm. There is no thickening of gallbladder wall or pericholecystic fluid. CBD measures 1.1 cm in diameter distally. Pancreas: Mild pancreatic atrophy again noted. Spleen: Enhanced spleen is unremarkable. Punctate calcifications within spleen from prior granulomatous disease. Adrenals/Urinary Tract: No adrenal gland mass. Enhanced kidneys are symmetrical in size. No hydronephrosis or hydroureter. No nephrolithiasis. No calcified ureteral calculi. Delayed renal images shows bilateral  renal symmetrical excretion. Bilateral visualized proximal ureter is unremarkable. Stomach/Bowel: No gastric outlet obstruction. Moderate stool noted in right colon and cecum. No pericecal inflammation. Normal appendix containing air is noted in axial image 63. There is fecal like material within terminal ileum. Mild fluid and gas distended small bowel loop in distal small bowel in right lower quadrant. Findings  suspicious for ileus, incompetent ileocecal valve or early bowel obstruction. Clinical correlation is necessary. Vascular/Lymphatic: Extensive atherosclerotic calcifications of abdominal aorta. Extensive atherosclerotic calcifications of splenic artery, atherosclerotic calcification bilateral proximal renal artery. Extensive atherosclerotic calcifications bilateral common iliac arteries. SMA calcifications are noted. There is no aortic aneurysm. Reproductive: The uterus is atrophic.  No adnexal mass. Other: No ascites or free abdominal air. The urinary bladder is unremarkable. There is mild anasarca infiltration subcutaneous fat flank wall and pelvic wall. Musculoskeletal: Sagittal images of the spine shows diffuse osteopenia. Again noted significant compression deformity T12 vertebral body. Stable mild compression fracture lower endplate of T3. Schmorl's node deformity lower endplate of T2 vertebral body. Multilevel disc space flattening with vacuum disc phenomenon. IMPRESSION: 1. There is small right pleural effusion with right base posterior atelectasis. Again noted intrahepatic pneumobilia. Gallstone within gallbladder measures 1.3 cm. No evidence of pericholecystic fluid or thickening of gallbladder wall. 2. Moderate stool noted within right colon and cecum. Normal appendix. No pericecal inflammation. There is fecal like material within terminal ileum. Mild distended small bowel loops in right lower quadrant terminal small bowel please see axial image 69. Findings suspicious for ileus, early bowel obstruction or incompetent ileocecal valve. Clinical correlation is necessary. 3. No ascites or free air. 4. No hydronephrosis or hydroureter. 5. Stable compression fractures of T12 and L3 vertebral body. Extensive degenerative changes lumbar spine again noted. In the 6. There is anasarca infiltration of subcutaneous fat bilateral flank and pelvic wall. Electronically Signed   By: Lahoma Crocker M.D.   On: 01/26/2016  15:50   I have personally reviewed and evaluated these images and lab results as part of my medical decision-making.   EKG Interpretation None      MDM   Final diagnoses:  Right lower quadrant abdominal pain  Weakness  Anemia, unspecified anemia type   Patient with anemia 7.5. Was hypotensive upon arrival. This is improved with fluids. Was given stress dose Solu-Cortef. Most recent pressure 93 systolic. Rest for anemia panel and type and crossmatch. She is guaiac negative. CT scan pending. Will require admission for evaluation and treatment for her anemia. Continue fluids as needed.     Tanna Furry, MD 01/26/16 1559  Tanna Furry, MD 01/26/16 [phone]

## 2016-01-26 NOTE — Progress Notes (Signed)
Nursing staff reported patient having chest pain before her blood transfusion was started. I saw and evaluated the patient at bedside. Patient states her chest pain started before her blood transfusion was started and has now resolved. States the pain started when she was resting in bed, lasted about 5 minutes, and resolved after she was given nitroglycerin. She describes the pain as substernal, 7/10 intensity, left-sided, radiating to the left shoulder, and states it feels like "pressure" in the area. Reports having chest congestion for the past few days and states she is not able to bring up phlegm when she coughs. Denies having any diaphoresis, dizziness/lightheadedness, nausea, or vomiting. Denies any GERD symptoms. Patient states she was in the hospital about a week ago for similar symptoms and was told at the time that her chest pain was not related to her heart. Reports having a history of esophageal spasms for the past 1 year for which she follows up with Dr. Ardis Hughs from gastroenterology. States she was told she had to get her esophagus "stretched" but has not been able to do that yet. Reports having a sensation of food getting stuck in her chest after eating and occasionally experiences some pain associated with eating. States she ate some crackers before her chest pain started today. Patient also states she has had 2 rotator cuff tears in the past and her left shoulder pain has been going on for the past 4-5 months.  Vitals: Temperature 98.2 Fahrenheit, pulse 92, respiratory rate 22, blood pressure 128/62, SpO2 99% on room air  Physical exam: Gen: Patient is resting comfortably in her hospital bed and appears to be in no acute distress. Cardiovascular: RRR, S1 and S2 appreciated, no M/R/G, pulses intact Lungs: CTAB. Patient is breathing comfortably on room air. Abdomen: Soft and nondistended. There is mild diffuse tenderness to palpation. No rebound, guarding, or rigidity. Msk: Significantly  reduced ROM of L shoulder (chronic as per patient). Shoulder joint is non-tender to palpation. No erythema, increased warmth, or edema of the joint.   A/P: Patient is hemodynamically stable and appears to be in no distress. Patient does have a history of coronary artery disease - MI and NSTEMI. She has a DES to circumflex. As such, in-stent restenosis is of concern. However, STAT EKG is not showing any acute ST/T-wave changes and initial troponin negative. Her left shoulder pain is chronic - MRI of left shoulder from March 2017 is consistent with rotator cuff tear.  Patient is currently being worked up for her abdominal pain during this hospitalization. Her chronic dysphagia and occasional odynophagia could also be possibly causing her chest pain as GI note from 08/24/2015 does mention patient being offered the option of EGD with dilation.  -Continue to cycle troponins -Voltaren gel for left shoulder pain -Mucinex for congestion. Will hold off on ordering a chest x-ray at this time as patient is breathing comfortably on room air and lungs are clear on auscultation.

## 2016-01-27 ENCOUNTER — Encounter (HOSPITAL_COMMUNITY): Payer: Medicare Other

## 2016-01-27 DIAGNOSIS — M549 Dorsalgia, unspecified: Secondary | ICD-10-CM

## 2016-01-27 DIAGNOSIS — N183 Chronic kidney disease, stage 3 (moderate): Secondary | ICD-10-CM

## 2016-01-27 DIAGNOSIS — I4891 Unspecified atrial fibrillation: Secondary | ICD-10-CM

## 2016-01-27 DIAGNOSIS — G8929 Other chronic pain: Secondary | ICD-10-CM

## 2016-01-27 DIAGNOSIS — I129 Hypertensive chronic kidney disease with stage 1 through stage 4 chronic kidney disease, or unspecified chronic kidney disease: Secondary | ICD-10-CM

## 2016-01-27 DIAGNOSIS — E1122 Type 2 diabetes mellitus with diabetic chronic kidney disease: Secondary | ICD-10-CM | POA: Diagnosis not present

## 2016-01-27 DIAGNOSIS — Z794 Long term (current) use of insulin: Secondary | ICD-10-CM

## 2016-01-27 DIAGNOSIS — Z7901 Long term (current) use of anticoagulants: Secondary | ICD-10-CM

## 2016-01-27 DIAGNOSIS — D649 Anemia, unspecified: Secondary | ICD-10-CM | POA: Diagnosis not present

## 2016-01-27 LAB — BASIC METABOLIC PANEL
ANION GAP: 5 (ref 5–15)
BUN: 37 mg/dL — ABNORMAL HIGH (ref 6–20)
CALCIUM: 7.6 mg/dL — AB (ref 8.9–10.3)
CO2: 27 mmol/L (ref 22–32)
CREATININE: 1.27 mg/dL — AB (ref 0.44–1.00)
Chloride: 105 mmol/L (ref 101–111)
GFR, EST AFRICAN AMERICAN: 45 mL/min — AB (ref >60)
GFR, EST NON AFRICAN AMERICAN: 39 mL/min — AB (ref >60)
Glucose, Bld: 237 mg/dL — ABNORMAL HIGH (ref 65–99)
Potassium: 5 mmol/L (ref 3.5–5.1)
SODIUM: 137 mmol/L (ref 135–145)

## 2016-01-27 LAB — TYPE AND SCREEN
ABO/RH(D): O POS
ANTIBODY SCREEN: NEGATIVE
UNIT DIVISION: 0

## 2016-01-27 LAB — CBC WITH DIFFERENTIAL/PLATELET
BASOS ABS: 0 10*3/uL (ref 0.0–0.1)
Basophils Relative: 0 %
EOS PCT: 0 %
Eosinophils Absolute: 0 10*3/uL (ref 0.0–0.7)
HEMATOCRIT: 26.3 % — AB (ref 36.0–46.0)
Hemoglobin: 8.3 g/dL — ABNORMAL LOW (ref 12.0–15.0)
LYMPHS ABS: 0.6 10*3/uL — AB (ref 0.7–4.0)
LYMPHS PCT: 13 %
MCH: 27.4 pg (ref 26.0–34.0)
MCHC: 31.6 g/dL (ref 30.0–36.0)
MCV: 86.8 fL (ref 78.0–100.0)
MONO ABS: 0.5 10*3/uL (ref 0.1–1.0)
MONOS PCT: 10 %
NEUTROS ABS: 3.5 10*3/uL (ref 1.7–7.7)
Neutrophils Relative %: 77 %
PLATELETS: 73 10*3/uL — AB (ref 150–400)
RBC: 3.03 MIL/uL — ABNORMAL LOW (ref 3.87–5.11)
RDW: 19.3 % — AB (ref 11.5–15.5)
WBC: 4.5 10*3/uL (ref 4.0–10.5)

## 2016-01-27 LAB — GLUCOSE, CAPILLARY
GLUCOSE-CAPILLARY: 168 mg/dL — AB (ref 65–99)
Glucose-Capillary: 129 mg/dL — ABNORMAL HIGH (ref 65–99)
Glucose-Capillary: 317 mg/dL — ABNORMAL HIGH (ref 65–99)

## 2016-01-27 LAB — HAPTOGLOBIN: HAPTOGLOBIN: 209 mg/dL — AB (ref 34–200)

## 2016-01-27 LAB — TROPONIN I: Troponin I: <0.03 ng/mL (ref <0.031)

## 2016-01-27 LAB — URINE CULTURE

## 2016-01-27 LAB — MAGNESIUM: Magnesium: 2.1 mg/dL (ref 1.7–2.4)

## 2016-01-27 MED ORDER — SENNA 8.6 MG PO TABS: 1.0000 | ORAL_TABLET | Freq: Two times a day (BID) | ORAL | Status: DC

## 2016-01-27 MED ORDER — POLYETHYLENE GLYCOL 3350 17 G PO PACK
17.0000 g | PACK | Freq: Two times a day (BID) | ORAL | Status: DC
  Filled 2016-01-27: qty 1

## 2016-01-27 MED ORDER — SENNA 8.6 MG PO TABS
1.0000 | ORAL_TABLET | Freq: Two times a day (BID) | ORAL | Status: DC
  Filled 2016-01-27: qty 1

## 2016-01-27 MED ORDER — OXYCODONE HCL 5 MG PO TABS: 5.0000 mg | ORAL_TABLET | Freq: Two times a day (BID) | ORAL | Status: DC | PRN

## 2016-01-27 MED ORDER — POLYETHYLENE GLYCOL 3350 17 G PO PACK: 17.0000 g | PACK | Freq: Two times a day (BID) | ORAL | Status: DC

## 2016-01-27 MED ORDER — MORPHINE SULFATE ER 60 MG PO TBCR: 60.0000 mg | EXTENDED_RELEASE_TABLET | Freq: Three times a day (TID) | ORAL | Status: DC

## 2016-01-27 NOTE — Evaluation (Signed)
Physical Therapy Evaluation Patient Details Name: Cynthia Hicks MRN: [id number] DOB: 1933/03/01 Today's Date: 01/27/2016   History of Present Illness  82 y.o. female admitted to [facility] on 01/26/16 with symptomatic anemia.  She is s/p 1 unit of PRBCs.  Pt with significant PMHx of HTN, DM, CAD, autoimmune liver disease, cellulitis, CKD, bulging disc, sciatica, diabetic neuropthay, PAF, chronic diastolic heart failure, PAD, L elbow surgery, bil knee arthroscopy for septic arthritis on 01/08/16.    Clinical Impression  Pt was able to get up and move around the room with the support of the therapist and RW.  We did not get to do any LE exercises because she was imminently discharging back to SNF.   PT to follow acutely for deficits listed below.       Follow Up Recommendations SNF (return to [facility])    Equipment Recommendations  None recommended by PT    Recommendations for Other Services   NA    Precautions / Restrictions Precautions Precautions: Fall Restrictions RLE Weight Bearing: Weight bearing as tolerated LLE Weight Bearing: Weight bearing as tolerated      Mobility  Bed Mobility Overal bed mobility: Needs Assistance Bed Mobility: Supine to Sit     Supine to sit: Min guard     General bed mobility comments: Min guard assist to support trunk when transitioning from sitting to supine.   Transfers Overall transfer level: Needs assistance Equipment used: Rolling walker (2 wheeled) Transfers: Sit to/from Stand Sit to Stand: Mod assist;Min assist         General transfer comment: Mod assist on first standing, min assist on consecutive attempts.  Support at trunk to prevent posterior LOB and support trunk over legs.    Ambulation/Gait Ambulation/Gait assistance: +2 safety/equipment;Min assist Ambulation Distance (Feet): 25 Feet Assistive device: Rolling walker (2 wheeled) Gait Pattern/deviations: Step-through pattern;Staggering left;Staggering right     General Gait  Details: Pt needed verbal cues to stay inside of RW and to stand as tall as possible (premorbid posture issues in her back).  Assist needed to support trunk for balance and very limited gait distance before fatigued.          Balance Overall balance assessment: Needs assistance Sitting-balance support: Feet supported;No upper extremity supported Sitting balance-Leahy Scale: Good     Standing balance support: Bilateral upper extremity supported Standing balance-Leahy Scale: Poor                               Pertinent Vitals/Pain Pain Assessment: 0-10 Pain Score: 6  Pain Location: bil shoulders back Pain Descriptors / Indicators: Aching;Burning;Constant Pain Intervention(s): Limited activity within patient's tolerance;Monitored during session;Repositioned    Home Living Family/patient expects to be discharged to:: Skilled nursing facility (from [facility] where she was getting rehab)                      Prior Function     Gait / Transfers Assistance Needed: gait with RW ~45' with assistance of therapy staff PTA              Extremity/Trunk Assessment   Upper Extremity Assessment: Defer to OT evaluation (bil restricted ROM in shoulders above 90 degrees)           Lower Extremity Assessment: RLE deficits/detail;LLE deficits/detail RLE Deficits / Details: bil LE s/p recent scopes earlier this month, can move against gravity to get EOB and can stand without buckling,  but pt needs support. LLE Deficits / Details: bil LE s/p recent scopes earlier this month, can move against gravity to get EOB and can stand without buckling, but pt needs support.  Cervical / Trunk Assessment: Kyphotic  Communication      Cognition Arousal/Alertness: Awake/alert Behavior During Therapy: WFL for tasks assessed/performed Overall Cognitive Status: Within Functional Limits for tasks assessed                               Assessment/Plan    PT  Assessment Patient needs continued PT services  PT Diagnosis Difficulty walking;Abnormality of gait;Generalized weakness;Acute pain   PT Problem List Decreased strength;Decreased activity tolerance;Decreased balance;Decreased mobility;Decreased range of motion;Decreased knowledge of use of DME;Pain  PT Treatment Interventions DME instruction;Gait training;Stair training;Functional mobility training;Therapeutic activities;Therapeutic exercise;Balance training;Neuromuscular re-education;Patient/family education;Manual techniques;Modalities   PT Goals (Current goals can be found in the Care Plan section) Acute Rehab PT Goals Patient Stated Goal: to get back to rehab and get stronger PT Goal Formulation: With patient Time For Goal Achievement: 02/10/16 Potential to Achieve Goals: Good    Frequency Min 2X/week (would benefit from more)        End of Session   Activity Tolerance: Patient limited by pain;Patient limited by fatigue Patient left: in chair;with call bell/phone within reach;with nursing/sitter in room      Functional Assessment Tool Used: assist level Functional Limitation: Mobility: Walking and moving around Mobility: Walking and Moving Around Current Status [id number]): At least 40 percent but less than 60 percent impaired, limited or restricted Mobility: Walking and Moving Around Goal Status [phone]): At least 40 percent but less than 60 percent impaired, limited or restricted Mobility: Walking and Moving Around Discharge Status [phone]): At least 40 percent but less than 60 percent impaired, limited or restricted    Time: 1500-1521 PT Time Calculation (min) (ACUTE ONLY): 21 min   Charges:   PT Evaluation $PT Eval Moderate Complexity: 1 Procedure     PT G Codes:   PT G-Codes **NOT FOR INPATIENT CLASS** Functional Assessment Tool Used: assist level Functional Limitation: Mobility: Walking and moving around Mobility: Walking and Moving Around Current Status [id number]): At least  40 percent but less than 60 percent impaired, limited or restricted Mobility: Walking and Moving Around Goal Status [phone]): At least 40 percent but less than 60 percent impaired, limited or restricted Mobility: Walking and Moving Around Discharge Status [phone]): At least 40 percent but less than 60 percent impaired, limited or restricted    Rayane Gallardo B. [place], [place], DPT [phone]   01/27/2016, 6:32 PM

## 2016-01-27 NOTE — Progress Notes (Signed)
Subjective: Patient seen and examined this morning on rounds.  She reports resolution of her chest pain, no shortness of breath, no urinary complaints.  She has not had a bowel movement since admission.  Her abdominal discomfort is stable.  She feels better after blood transfusion.  Objective: Vital signs in last 24 hours: Filed Vitals:   01/26/16 2251 01/26/16 2316 01/27/16 0144 01/27/16 0603  BP: 121/46 128/62 109/42 105/58  Pulse: 85 92 84 73  Temp: 98.3 F (36.8 C) 98.2 F (36.8 C) 98.4 F (36.9 C) 98.1 F (36.7 C)  TempSrc:    Oral  Resp:  22 16 16   SpO2: 98% 99% 96% 96%   Weight change:   Intake/Output Summary (Last 24 hours) at 01/27/16 1016 Last data filed at 01/27/16 0939  Gross per 24 hour  Intake   1247 ml  Output    625 ml  Net    622 ml   General: resting in bed, no distress, eating breakfast HEENT: conjunctiva less pale, EOMI, no scleral icterus Cardiac: RRR, chest wall with area of ecchymosis from EKG leads Pulm: normal effort Abd: soft, nontender, nondistended, hypoactive bowel sounds Ext: chronic erythema of lower extremities and ecchymosis of upper extremities Neuro: alert and oriented X3, cranial nerves II-XII grossly intact  Lab Results: Basic Metabolic Panel:  Recent Labs Lab 01/26/16 1401 01/26/16 1412 01/27/16 0205  NA 135 137 137  K 5.6* 5.6* 5.0  CL 102 99* 105  CO2 26  --  27  GLUCOSE 217* 208* 237*  BUN 40* 36* 37*  CREATININE 1.39* 1.50* 1.27*  CALCIUM 8.1*  --  7.6*   Liver Function Tests:  Recent Labs Lab 01/26/16 1401  AST 13*  ALT 12*  ALKPHOS 68  BILITOT 0.6  PROT 4.6*  ALBUMIN 2.3*   No results for input(s): LIPASE, AMYLASE in the last 168 hours. No results for input(s): AMMONIA in the last 168 hours. CBC:  Recent Labs Lab 01/26/16 1401  01/26/16 2048 01/27/16 0205  WBC 10.0  --  5.3 4.5  NEUTROABS 9.1*  --   --  3.5  HGB 7.5*  < > 6.9* 8.3*  HCT 24.2*  < > 22.0* 26.3*  MCV 89.6  --  89.8 86.8  PLT  102*  --  84* 73*  < > = values in this interval not displayed. Cardiac Enzymes:  Recent Labs Lab 01/27/16 0205 01/27/16 0721  TROPONINI <0.03 <0.03   CBG:  Recent Labs Lab 01/27/16 0054 01/27/16 0828  GLUCAP 317* 129*   Thyroid Function Tests:  Recent Labs Lab 01/26/16 1613  TSH 2.737   Anemia Panel:  Recent Labs Lab 01/26/16 1613  VITAMINB12 340  FOLATE 28.8  FERRITIN 409*  TIBC 227*  IRON 53  RETICCTPCT <0.4*   Urinalysis:  Recent Labs Lab 01/26/16 1735  COLORURINE YELLOW  LABSPEC 1.022  PHURINE 5.5  GLUCOSEU NEGATIVE  HGBUR NEGATIVE  BILIRUBINUR NEGATIVE  KETONESUR NEGATIVE  PROTEINUR NEGATIVE  NITRITE NEGATIVE  LEUKOCYTESUR SMALL*    Micro Results: Recent Results (from the past 240 hour(s))  MRSA PCR Screening     Status: None   Collection Time: 01/26/16  7:45 PM  Result Value Ref Range Status   MRSA by PCR NEGATIVE NEGATIVE Final    Comment:        The GeneXpert MRSA Assay (FDA approved for NASAL specimens only), is one component of a comprehensive MRSA colonization surveillance program. It is not intended to diagnose MRSA infection nor  to guide or monitor treatment for MRSA infections.    Studies/Results: Ct Abdomen Pelvis W Contrast  01/26/2016  CLINICAL DATA:  Right lower quadrant pain EXAM: CT ABDOMEN AND PELVIS WITH CONTRAST TECHNIQUE: Multidetector CT imaging of the abdomen and pelvis was performed using the standard protocol following bolus administration of intravenous contrast. CONTRAST:  64mL ISOVUE-300 IOPAMIDOL (ISOVUE-300) INJECTION 61% COMPARISON:  CT scan 05/02/2015 FINDINGS: Lower chest: There is small right pleural effusion with right base posterior atelectasis. Atherosclerotic calcifications of coronary arteries are noted. Cardiomegaly is noted. Hepatobiliary: Again noted intrahepatic pneumobilia especially in left hepatic lobe. Small amount of air within gallbladder again noted. A gallstone is noted within gallbladder  measures 1.3 cm. There is no thickening of gallbladder wall or pericholecystic fluid. CBD measures 1.1 cm in diameter distally. Pancreas: Mild pancreatic atrophy again noted. Spleen: Enhanced spleen is unremarkable. Punctate calcifications within spleen from prior granulomatous disease. Adrenals/Urinary Tract: No adrenal gland mass. Enhanced kidneys are symmetrical in size. No hydronephrosis or hydroureter. No nephrolithiasis. No calcified ureteral calculi. Delayed renal images shows bilateral renal symmetrical excretion. Bilateral visualized proximal ureter is unremarkable. Stomach/Bowel: No gastric outlet obstruction. Moderate stool noted in right colon and cecum. No pericecal inflammation. Normal appendix containing air is noted in axial image 63. There is fecal like material within terminal ileum. Mild fluid and gas distended small bowel loop in distal small bowel in right lower quadrant. Findings suspicious for ileus, incompetent ileocecal valve or early bowel obstruction. Clinical correlation is necessary. Vascular/Lymphatic: Extensive atherosclerotic calcifications of abdominal aorta. Extensive atherosclerotic calcifications of splenic artery, atherosclerotic calcification bilateral proximal renal artery. Extensive atherosclerotic calcifications bilateral common iliac arteries. SMA calcifications are noted. There is no aortic aneurysm. Reproductive: The uterus is atrophic.  No adnexal mass. Other: No ascites or free abdominal air. The urinary bladder is unremarkable. There is mild anasarca infiltration subcutaneous fat flank wall and pelvic wall. Musculoskeletal: Sagittal images of the spine shows diffuse osteopenia. Again noted significant compression deformity T12 vertebral body. Stable mild compression fracture lower endplate of T3. Schmorl's node deformity lower endplate of T2 vertebral body. Multilevel disc space flattening with vacuum disc phenomenon. IMPRESSION: 1. There is small right pleural effusion  with right base posterior atelectasis. Again noted intrahepatic pneumobilia. Gallstone within gallbladder measures 1.3 cm. No evidence of pericholecystic fluid or thickening of gallbladder wall. 2. Moderate stool noted within right colon and cecum. Normal appendix. No pericecal inflammation. There is fecal like material within terminal ileum. Mild distended small bowel loops in right lower quadrant terminal small bowel please see axial image 69. Findings suspicious for ileus, early bowel obstruction or incompetent ileocecal valve. Clinical correlation is necessary. 3. No ascites or free air. 4. No hydronephrosis or hydroureter. 5. Stable compression fractures of T12 and L3 vertebral body. Extensive degenerative changes lumbar spine again noted. In the 6. There is anasarca infiltration of subcutaneous fat bilateral flank and pelvic wall. Electronically Signed   By: Lahoma Crocker M.D.   On: 01/26/2016 15:50   Dg Hip Unilat With Pelvis 2-3 Views Right  01/26/2016  CLINICAL DATA:  Chronic anterior right hip pain.  Initial encounter. EXAM: DG HIP (WITH OR WITHOUT PELVIS) 2-3V RIGHT COMPARISON:  None. FINDINGS: There is no evidence of fracture or dislocation. Both femoral heads are seated normally within their respective acetabula. The proximal right femur appears intact. Mild degenerative change is noted along the lower lumbar spine. The sacroiliac joints are unremarkable in appearance. The visualized bowel gas pattern is grossly unremarkable in appearance.  Scattered vascular calcifications are seen. IMPRESSION: 1. No evidence of fracture or dislocation. 2. Scattered vascular calcifications seen. Electronically Signed   By: Garald Balding M.D.   On: 01/26/2016 18:21   Medications: I have reviewed the patient's current medications. Scheduled Meds: . sodium chloride   Intravenous Once  . apixaban  2.5 mg Oral BID  . bisoprolol  5 mg Oral Daily  . diclofenac sodium  2 g Topical QID  . dofetilide  125 mcg Oral BID    . feeding supplement (GLUCERNA SHAKE)  237 mL Oral BID BM  . gabapentin  100 mg Oral TID  . guaiFENesin  600 mg Oral BID  . insulin aspart  0-20 Units Subcutaneous TID WC  . insulin aspart  0-5 Units Subcutaneous QHS  . [START ON 01/28/2016] levofloxacin  750 mg Oral Q48H  . magnesium oxide  400 mg Oral Daily  . morphine  60 mg Oral Q8H  . multivitamin with minerals  1 tablet Oral Daily  . pantoprazole  40 mg Oral Daily  . pravastatin  80 mg Oral QPM  . predniSONE  10 mg Oral Daily  . vitamin C  1,000 mg Oral BID   Continuous Infusions:  PRN Meds:.nitroGLYCERIN, oxyCODONE, polyvinyl alcohol Assessment/Plan: Active Problems:   Anemia  RLQ Abdominal Pain: pain is improved today and x-ray of her hip was not remarkable.  Likely secondary to constipation from early ileus given her CT findings and hypoactive bowel sounds on exam.  She is tolerating PO intake without nausea or vomiting.  Given her high opiate burden, it will be important for her to maintain a healthy bowel regimen. - C. Diff still pending to rule out infectious colitis with recent antibiotics and self-reported diarrhea at SNF.  If no BM by noon to test, will discontinue as C. Diff unlikely at that point - will send to SNF with Miralax BID and senna BID to ensure regular solid BM's  Symptomatic Anemia: patient improved this morning after receiving pRBC's overnight with stable hemoglobin this morning of 8.3.  No evidence of active bleeding with normal reticulocyte count and other anemia work-up unremarkable.  B12 and folate normal, LDH normal.  Ferritin was elevated suggestive of her anemia given CKD III.  She does have a newer thrombocytopenia that may be due to her acute illness that she is recovering from - monitor for bleeding, transfuse as needed  - will recommend weekly CBC at her skilled nursing facility - PT evaluation  Hyperkalemia: 5.6 on admission, down to 5.0 this morning with no interventions - no changes on EKG -  keep potassium above 4 on Tikosyn - check magnesium  Recent Pseudomonas Septic Arthritis: she is s/p knee washout bilaterally by orthopedic surgery and currently on Levaquin 750mg  Q48H. Currently afebrile and without leukocytosis. She reports no pain in her knees and has been doing well since surgery. - continue Levaquin through 01/31/16 - PT evaluation  Atrial Fibrillation (CHADS-Vasc 6): Patient on chronic anticoagulation  - Dofetilide 125 mcg BID - Bisoprolol 5 mg daily - Continue Eliquis 2.5mg  daily  CKD III: stable  DM2: Takes Regular Insulin 8U breakfast and lunch, 12-15U at dinner on a sliding scale. - Resistant SSI  HTN - hold Losartan 25 mg daily  Autoimmune Hepatitis: Has been on chronic steroids for many years. - Prednisone 10 mg daily  Chronic Back Pain: On chronic pain regimen - Morphine 60 mg 12 hr q8h prn - Oxycodone 5 mg BID as needed - Bowel regimen as  above  DVT PPx: eliquis  Diet: Carb mod  Code: FULL   Dispo: Likely back to SNF today  The patient does have a current PCP (Imran Gertie Baron, MD) and does not need an [facility] hospital follow-up appointment after discharge.  The patient does not have transportation limitations that hinder transportation to clinic appointments.  .Services Needed at time of discharge: Y = Yes, Blank = No PT:   OT:   RN:   Equipment:   Other:     LOS: 1 day   Jule Ser, DO 01/27/2016, 10:16 AM

## 2016-01-27 NOTE — Progress Notes (Signed)
Report called to Watt Climes, receiving nurse at [facility]. All questions answered. Direct number given for direct line to this nurse. Prescriptions sent with pt. Son will be transporting pt to SNF. Pt transported to car via w/c by NT. IV has been removed. Pt being d/c'd in stable condition.

## 2016-01-27 NOTE — Progress Notes (Signed)
Patient will DC to: [facility] Anticipated DC date: 01/27/16 Family notified: Son Psychiatric nurse by: Musician   Per MD patient ready for DC to . RN, patient, patient's family, and facility notified of DC. RN given number for report.   CSW signing off.  Cedric Fishman, [place] Social Worker [phone]

## 2016-01-27 NOTE — Care Management Note (Signed)
Case Management Note  Patient Details  Name: Cynthia Hicks MRN: [id number] Date of Birth: Aug 08, 1932  Subjective/Objective:                 Patient admitted from [facility] for Anemia.    Action/Plan:  Will DC to SNF when medically stable.   Expected Discharge Date:                  Expected Discharge Plan:  [place] [facility] SNF)  In-House Referral:  Clinical Social Work  Discharge planning Services  CM Consult  Post Acute Care Choice:  NA Choice offered to:     DME Arranged:    DME Agency:     HH Arranged:    [place] Agency:     Status of Service:  Completed, signed off  If discussed at [employer], dates discussed:    Additional Comments:  Carles Collet, RN 01/27/2016, 1:45 PM

## 2016-01-27 NOTE — Care Management Obs Status (Signed)
[place] NOTIFICATION   Patient Details  Name: AILANIE HOWMAN MRN: [id number] Date of Birth: [birth year]   Medicare Observation Status Notification Given:  Yes  [place], RN 01/27/2016, 2:45 PM

## 2016-01-27 NOTE — NC FL2 (Signed)
[place] LEVEL OF CARE SCREENING TOOL     IDENTIFICATION  Patient Name: Cynthia Hicks Birthdate: 12/08/1932 Sex: female Admission Date (Current Location): 01/26/2016  [facility] [place] Number:  Herbalist and Address:  [facility] [place] [address] [place], [place] [address]      Provider Number: [id number]  Attending Physician Name and Address:  Axel Filler, MD  Relative Name and Phone Number:       Current Level of Care: Hospital Recommended Level of Care: [place] Prior Approval Number:    Date Approved/Denied:   PASRR Number: [id number]  Discharge Plan: SNF    Current Diagnoses: Patient Active Problem List   Diagnosis Date Noted  . Anemia 01/26/2016  . Chest pain 01/19/2016  . PAF (paroxysmal atrial fibrillation) ([place])   . Hyperlipidemia   . PAD (peripheral artery disease) ([place])   . Chronic acquired lymphedema   . Septic arthritis of knee ([place]) 01/09/2016  . Malnutrition of moderate degree 01/08/2016  . Pressure ulcer 01/08/2016  . Knee pain, bilateral 01/06/2016  . PICC (peripherally inserted central catheter) in place   . Sepsis ([place]) 06/08/2015  . Sepsis due to methicillin resistant Staphylococcus aureus ([place]) 06/08/2015  . Visit for monitoring Tikosyn therapy   . CKD (chronic kidney disease), stage III 06/24/2014  . CAP (community acquired pneumonia)   . Acute on chronic diastolic CHF (congestive heart failure) ([place])   . Cellulitis of right lower extremity   . Diabetes mellitus type 2, uncontrolled ([place])   . Polyneuropathy in diabetes(357.2) 03/04/2014  . Localized, primary osteoarthritis of shoulder region 11/02/2013  . Chronic diastolic CHF (congestive heart failure) ([place]) 05/02/2013  . Biliary calculi, common bile duct 02/28/2013  . Fungal infection of nail 02/28/2013  . H/O immunosuppressive therapy 02/28/2013  . Long term (current) use of anticoagulants 10/31/2012  .  Swelling of limb 10/22/2012  . Pain in limb 10/22/2012  . Lymphedema 10/22/2012  . Cellulitis 07/10/2012  . Arteriosclerosis of coronary artery 04/04/2012  . Encounter for long-term (current) use of steroids 04/04/2012  . Diabetes mellitus, type 2 ([place]) 04/04/2012  . HLD (hyperlipidemia) 04/04/2012  . Disease of liver 04/04/2012  . Heart attack ([place]) 04/04/2012  . Long term current use of systemic steroids 04/04/2012  . Abnormal abdominal MRI 03/03/2012  . Abdominal pain 12/04/2011  . Systolic CHF, chronic ([place]) 09/15/2011  . NSTEMI (non-ST elevated myocardial infarction) ([place]) 08/30/2011  . Systolic CHF, acute ([place]) 08/30/2011  . Coronary artery disease   . Chronic back pain   . Chronic kidney disease   . Autoimmune liver disease   . Carotid bruit 07/02/2011  . Hypoxemia 11/17/2010  . Palpitations 11/06/2010  . OTHER NONINFECTIOUS DISORDERS LYMPHATIC CHANNELS 07/21/2009  . Diabetes mellitus ([place]) 07/07/2009  . Anemia of chronic disease 07/07/2009  . HTN (hypertension) 07/07/2009  . GERD 07/07/2009  . Autoimmune hepatitis ([place]) 07/07/2009  . DEGENERATIVE DISC DISEASE 07/07/2009  . PSOAS MUSCLE ABSCESS 07/05/2009  . HYPERLIPIDEMIA-MIXED 01/02/2009  . VENOUS INSUFFICIENCY, LEGS 01/02/2009    Orientation RESPIRATION BLADDER Height & Weight     Place, Situation, Time, Self  Normal Continent Weight:   Height:     BEHAVIORAL SYMPTOMS/MOOD NEUROLOGICAL BOWEL NUTRITION STATUS      Continent Diet (Please see DC summary)  AMBULATORY STATUS COMMUNICATION OF NEEDS Skin   Extensive Assist Verbally PU Stage and Appropriate Care, Surgical wounds (Stage II on sacrum; Closed incision on knee)  Personal Care Assistance Level of Assistance  Bathing, Dressing Bathing Assistance: Maximum assistance   Dressing Assistance: Maximum assistance     Functional Limitations Info  Sight Sight Info: Impaired        SPECIAL CARE FACTORS FREQUENCY                        Contractures      Additional Factors Info  Code Status, Allergies, Insulin Sliding Scale, Isolation Precautions Code Status Info: Full Allergies Info: Cyclosporine, Azathioprine, Mycophenolate, Other, Oysters   Insulin Sliding Scale Info: 3/wk Isolation Precautions Info: Enteric precautions     Current Medications (01/27/2016):  This is the current hospital active medication list Current Facility-Administered Medications  Medication Dose Route Frequency Provider Last Rate Last Dose  . 0.9 %  sodium chloride infusion   Intravenous Once Norman Herrlich, MD      . apixaban Arne [place]) tablet 2.5 mg  2.5 mg Oral BID Norman Herrlich, MD   2.5 mg at 01/27/16 1058  . bisoprolol (ZEBETA) tablet 5 mg  5 mg Oral Daily Norman Herrlich, MD   5 mg at 01/27/16 1100  . diclofenac sodium (VOLTAREN) 1 % transdermal gel 2 g  2 g Topical QID Shela Leff, MD   2 g at 01/27/16 1104  . dofetilide (TIKOSYN) capsule 125 mcg  125 mcg Oral BID Norman Herrlich, MD   125 mcg at 01/27/16 1057  . feeding supplement (GLUCERNA SHAKE) (GLUCERNA SHAKE) liquid 237 mL  237 mL Oral BID BM Norman Herrlich, MD   237 mL at 01/27/16 1000  . gabapentin (NEURONTIN) capsule 100 mg  100 mg Oral TID Norman Herrlich, MD   100 mg at 01/27/16 1100  . guaiFENesin (MUCINEX) 12 hr tablet 600 mg  600 mg Oral BID Shela Leff, MD   600 mg at 01/27/16 1100  . insulin aspart (novoLOG) injection 0-20 Units  0-20 Units Subcutaneous TID WC Jule Ser, DO   3 Units at 01/27/16 [phone]  . insulin aspart (novoLOG) injection 0-5 Units  0-5 Units Subcutaneous QHS Jule Ser, DO   4 Units at 01/27/16 0055  . [START ON 01/28/2016] levofloxacin (LEVAQUIN) tablet 750 mg  750 mg Oral Q48H Norman Herrlich, MD      . magnesium oxide (MAG-OX) tablet 400 mg  400 mg Oral Daily Norman Herrlich, MD   400 mg at 01/27/16 1100  . morphine (MS CONTIN) 12 hr tablet 60 mg  60 mg Oral Q8H Norman Herrlich, MD   60 mg at 01/27/16 0539  . multivitamin  with minerals tablet 1 tablet  1 tablet Oral Daily Norman Herrlich, MD   1 tablet at 01/27/16 1059  . nitroGLYCERIN (NITROSTAT) SL tablet 0.4 mg  0.4 mg Sublingual Q5 min PRN Norman Herrlich, MD   0.4 mg at 01/26/16 2321  . oxyCODONE (Oxy IR/ROXICODONE) immediate release tablet 5 mg  5 mg Oral BID PRN Norman Herrlich, MD   5 mg at 01/27/16 0047  . pantoprazole (PROTONIX) EC tablet 40 mg  40 mg Oral Daily Norman Herrlich, MD   40 mg at 01/27/16 1100  . polyvinyl alcohol (LIQUIFILM TEARS) 1.4 % ophthalmic solution 1 drop  1 drop Both Eyes Daily PRN Norman Herrlich, MD      . pravastatin (PRAVACHOL) tablet 80 mg  80 mg Oral QPM Norman Herrlich, MD      . predniSONE (DELTASONE) tablet 10  mg  10 mg Oral Daily Norman Herrlich, MD   10 mg at 01/27/16 1058  . vitamin C (ASCORBIC ACID) tablet 1,000 mg  1,000 mg Oral BID Norman Herrlich, MD   1,000 mg at 01/27/16 1059     Discharge Medications: Please see discharge summary for a list of discharge medications.  Relevant Imaging Results:  Relevant Lab Results:   Additional Information SS#: [id number]  Benard Halsted, LCSWA

## 2016-01-27 NOTE — Clinical Social Work Note (Signed)
Clinical Social Work Assessment  Patient Details  Name: Cynthia Hicks MRN: [id number] Date of Birth: [birth year]  Date of referral:  01/27/16               Reason for consult:  Facility Placement                Permission sought to share information with:  Facility Sport and exercise psychologist, Family Supports Permission granted to share information::  Yes, Verbal Permission Granted  Name::        Agency::  [facility]  Relationship::  son  Contact Information:     Housing/Transportation Living arrangements for the past 2 months:  [place], [place] of Information:  Patient Patient Interpreter Needed:  None Criminal Activity/Legal Involvement Pertinent to Current Situation/Hospitalization:  No - Comment as needed Significant Relationships:  Adult Children Lives with:  Self Do you feel safe going back to the place where you live?  Yes Need for family participation in patient care:  Yes (Comment)  Care giving concerns: CSW spoke with patient at bedside. She stated she is from [facility] and will be returning there.   Social Worker assessment / plan:  CSW spoke with patient concerning return to [place].  Employment status:  Retired Forensic scientist:  Medicare PT Recommendations:  Not assessed at this time [place] / Referral to community resources:  [place]  Patient/Family's Response to care:  Patient reported agreement with plan. She would like her son or another family member to come pick her up by car.  Patient/Family's Understanding of and Emotional Response to Diagnosis, Current Treatment, and Prognosis:  Patient is realistic regarding therapy needs. No questions/concerns about plan or treatment.    Emotional Assessment Appearance:  Appears stated age Attitude/Demeanor/Rapport:  Other (Appropriate) Affect (typically observed):  Accepting, Appropriate Orientation:  Oriented to  Time, Oriented to Situation, Oriented to  Place, Oriented to Self Alcohol / Substance use:  Not Applicable Psych involvement (Current and /or in the community):  No (Comment)  Discharge Needs  Concerns to be addressed:  Care Coordination Readmission within the last 30 days:  Yes Current discharge risk:  None Barriers to Discharge:  No Barriers Identified   Benard Halsted, [place] 01/27/2016, 11:38 AM

## 2016-01-27 NOTE — Discharge Summary (Signed)
Name: Cynthia Hicks MRN: [id number] DOB: May 17, 1933 82 y.o. PCP: Raelyn Number, MD  Date of Admission: 01/26/2016  1:06 PM Date of Discharge: 01/27/2016 Attending Physician: Axel Filler, MD  Discharge Diagnosis: 1. Anemia 2. Constipation 3. Diabetes 4. Atrial fibrillation 5. HTN 6. Chronic Back Pain 7. CKD   Discharge Medications:   Medication List    TAKE these medications        acetaminophen 325 MG tablet  Commonly known as:  TYLENOL  Take 2 tablets (650 mg total) by mouth every 6 (six) hours as needed for mild pain (or Fever >/= 101).     alendronate 70 MG tablet  Commonly known as:  FOSAMAX  Take 70 mg by mouth every Monday. Take with a full glass of water on an empty stomach.     apixaban 2.5 MG Tabs tablet  Commonly known as:  ELIQUIS  Take 1 tablet (2.5 mg total) by mouth 2 (two) times daily.     bisoprolol 5 MG tablet  Commonly known as:  ZEBETA  Take 1 tablet (5 mg total) by mouth daily.     clotrimazole 10 MG troche  Commonly known as:  MYCELEX  Take 10 mg by mouth 5 (five) times daily.     diclofenac sodium 1 % Gel  Commonly known as:  VOLTAREN  Apply 2 g topically 4 (four) times daily.     dofetilide 125 MCG capsule  Commonly known as:  TIKOSYN  Take 1 capsule (125 mcg total) by mouth 2 (two) times daily.     feeding supplement (GLUCERNA SHAKE) Liqd  Take 237 mLs by mouth 2 (two) times daily between meals.     gabapentin 100 MG capsule  Commonly known as:  NEURONTIN  Take 100 mg by mouth 3 (three) times daily.     levofloxacin 750 MG tablet  Commonly known as:  LEVAQUIN  Take 1 tablet (750 mg total) by mouth every other day. Last dose on 01/31/16.     losartan 25 MG tablet  Commonly known as:  COZAAR  TAKE 1 TABLET BY MOUTH EVERY DAY     magnesium oxide 400 (241.3 Mg) MG tablet  Commonly known as:  MAG-OX  Take 1 tablet (400 mg total) by mouth daily.     morphine 60 MG 12 hr tablet  Commonly known as:  MS CONTIN  Take 1  tablet (60 mg total) by mouth every 8 (eight) hours.     multivitamin tablet  Take 1 tablet by mouth daily.     nitroGLYCERIN 0.4 MG SL tablet  Commonly known as:  NITROSTAT  Place 1 tablet (0.4 mg total) under the tongue every 5 (five) minutes as needed for chest pain.     omeprazole 20 MG capsule  Commonly known as:  PRILOSEC  Take 20 mg by mouth daily.     oxyCODONE 5 MG immediate release tablet  Commonly known as:  Oxy IR/ROXICODONE  Take 1 tablet (5 mg total) by mouth 2 (two) times daily as needed for moderate pain.     polyethylene glycol packet  Commonly known as:  MIRALAX / GLYCOLAX  Take 17 g by mouth 2 (two) times daily.     polyvinyl alcohol 1.4 % ophthalmic solution  Commonly known as:  LIQUIFILM TEARS  Place 1 drop into both eyes daily as needed for dry eyes.     pravastatin 80 MG tablet  Commonly known as:  PRAVACHOL  Take 1 tablet (80 mg  total) by mouth every evening.     predniSONE 10 MG tablet  Commonly known as:  DELTASONE  Take 10 mg by mouth daily. Patient started on a 70 mg dose taper on 12-31-15     RELION R IJ  Inject 4-16 Units as directed 3 (three) times daily. BS 120-200=4 units, 201-250=6 units, 251-300=8 units, 301-350=10 units, 351-400=14 units, 401-450=16 units.     senna 8.6 MG Tabs tablet  Commonly known as:  SENOKOT  Take 1 tablet (8.6 mg total) by mouth 2 (two) times daily.     vitamin C 1000 MG tablet  Take 1,000 mg by mouth 2 (two) times daily.     VITAMIN D PO  Take 2,000 Units by mouth at bedtime.        Disposition and follow-up:   Ms.Donnalynn C Perdomo was discharged from [facility] in Stable condition.    1.  At the hospital follow up visit please address:  - patient will take Levaquin 749m every other day for her recent septic arthritis.  Last dose will be on January 31, 2016 - patient should continue bowel regimen until having daily formed bowel movements.  Can taper down to once daily Miralax and Senna as  needed - given recent anemia and thrombocytopenia, please check weekly CBC - as discussed with prior discharge summary, please check BMET's twice weekly and ESR/CRP next week per ID request - needs follow up as below - please arrange for outpatient hematology follow up to rule out developing MDS  2.  Labs / imaging needed at time of follow-up: BMET twice weekly, CBC weekly  3.  Pending labs/ test needing follow-up: Fungal culture (knee aspirate)  Follow-up Appointments:  Follow-up Information    Follow up with DNewt Minion MD.   Specialty:  Orthopedic Surgery   Contact information:   [address] [phone]      Follow up with HBonnita Nasuti MD.   Specialty:  Internal Medicine   Contact information:   [id number] [phone]      [facility]   Specialty:  Infectious Diseases   Contact information:   [address] [address] [phone]     Follow up with [facility].   Specialty:  SSardiniainformation:   [address] [phone]     Discharge Instructions:   Consultations:    Procedures Performed:  Dg Chest 2 View  01/19/2016  CLINICAL DATA:  Chest pain, weakness EXAM: CHEST  2 VIEW COMPARISON:  06/10/2015 FINDINGS: Cardiomediastinal silhouette is stable. No infiltrate or pulmonary edema. There is chronic elevation of the right hemidiaphragm. Mild right basilar atelectasis. Extensive degenerative changes right shoulder. Thoracic spine osteopenia. IMPRESSION: No infiltrate or pulmonary edema. Chronic elevation of the right hemidiaphragm with right basilar atelectasis. Extensive degenerative changes right shoulder. Electronically Signed   By: LLahoma CrockerM.D.   On: 01/19/2016 11:51   Ct Abdomen Pelvis W  Contrast  01/26/2016  CLINICAL DATA:  Right lower quadrant pain EXAM: CT ABDOMEN AND PELVIS WITH CONTRAST TECHNIQUE: Multidetector CT imaging of the abdomen and pelvis was performed using the standard protocol following bolus administration of intravenous contrast. CONTRAST:  737mISOVUE-300 IOPAMIDOL (ISOVUE-300) INJECTION 61% COMPARISON:  CT scan 05/02/2015 FINDINGS: Lower chest: There is small right pleural  effusion with right base posterior atelectasis. Atherosclerotic calcifications of coronary arteries are noted. Cardiomegaly is noted. Hepatobiliary: Again noted intrahepatic pneumobilia especially in left hepatic lobe. Small amount of air within gallbladder again noted. A gallstone is noted within gallbladder measures 1.3 cm. There is no thickening of gallbladder wall or pericholecystic fluid. CBD measures 1.1 cm in diameter distally. Pancreas: Mild pancreatic atrophy again noted. Spleen: Enhanced spleen is unremarkable. Punctate calcifications within spleen from prior granulomatous disease. Adrenals/Urinary Tract: No adrenal gland mass. Enhanced kidneys are symmetrical in size. No hydronephrosis or hydroureter. No nephrolithiasis. No calcified ureteral calculi. Delayed renal images shows bilateral renal symmetrical excretion. Bilateral visualized proximal ureter is unremarkable. Stomach/Bowel: No gastric outlet obstruction. Moderate stool noted in right colon and cecum. No pericecal inflammation. Normal appendix containing air is noted in axial image 63. There is fecal like material within terminal ileum. Mild fluid and gas distended small bowel loop in distal small bowel in right lower quadrant. Findings suspicious for ileus, incompetent ileocecal valve or early bowel obstruction. Clinical correlation is necessary. Vascular/Lymphatic: Extensive atherosclerotic calcifications of abdominal aorta. Extensive atherosclerotic calcifications of splenic artery, atherosclerotic calcification bilateral proximal renal  artery. Extensive atherosclerotic calcifications bilateral common iliac arteries. SMA calcifications are noted. There is no aortic aneurysm. Reproductive: The uterus is atrophic.  No adnexal mass. Other: No ascites or free abdominal air. The urinary bladder is unremarkable. There is mild anasarca infiltration subcutaneous fat flank wall and pelvic wall. Musculoskeletal: Sagittal images of the spine shows diffuse osteopenia. Again noted significant compression deformity T12 vertebral body. Stable mild compression fracture lower endplate of T3. Schmorl's node deformity lower endplate of T2 vertebral body. Multilevel disc space flattening with vacuum disc phenomenon. IMPRESSION: 1. There is small right pleural effusion with right base posterior atelectasis. Again noted intrahepatic pneumobilia. Gallstone within gallbladder measures 1.3 cm. No evidence of pericholecystic fluid or thickening of gallbladder wall. 2. Moderate stool noted within right colon and cecum. Normal appendix. No pericecal inflammation. There is fecal like material within terminal ileum. Mild distended small bowel loops in right lower quadrant terminal small bowel please see axial image 69. Findings suspicious for ileus, early bowel obstruction or incompetent ileocecal valve. Clinical correlation is necessary. 3. No ascites or free air. 4. No hydronephrosis or hydroureter. 5. Stable compression fractures of T12 and L3 vertebral body. Extensive degenerative changes lumbar spine again noted. In the 6. There is anasarca infiltration of subcutaneous fat bilateral flank and pelvic wall. Electronically Signed   By: Lahoma Crocker M.D.   On: 01/26/2016 15:50   Dg Knee Complete 4 Views Left  01/07/2016  CLINICAL DATA:  Bilateral knee pain and swelling for 2 weeks with redness, right worse than left. EXAM: LEFT KNEE - COMPLETE 4+ VIEW COMPARISON:  Left tibia/ fibula radiographs 12/16/2015 FINDINGS: No acute fracture, dislocation, or or sizable knee joint  effusion is identified. There is mild medial compartment joint space narrowing. Geographic, sclerotic intramedullary lesions are again seen involving the distal femur and proximal tibia and are most consistent with bone infarcts. Vascular calcifications are noted. Mild nonspecific soft tissue thickening anterior to the knee. IMPRESSION: No acute osseous abnormality identified. 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 Electronically Signed   By: Logan Bores M.D.   On: 01/07/2016 14:28   Dg Knee Complete 4 Views Right  01/07/2016  CLINICAL DATA:  Bilateral knee pain and swelling for 2 weeks, right worse than left. Skin redness. EXAM: RIGHT KNEE - COMPLETE 4+ VIEW COMPARISON:  Right tibia/ fibula  radiographs 12/16/2015. Right knee radiographs 09/12/2012. FINDINGS: No acute fracture, dislocation, or sizable knee joint effusion is identified. There is mild diffuse soft tissue thickening along the anterior aspect of the knee. There is at most mild medial compartment joint space narrowing. The bones are osteopenic. Vascular calcifications are noted. IMPRESSION: Anterior knee soft tissue thickening, nonspecific. No acute osseous abnormality identified. Electronically Signed   By: Logan Bores M.D.   On: 01/07/2016 14:25   Dg Fluoro Guided Needle Plc Aspiration/injection Loc  01/11/2016  CLINICAL DATA:  Bilateral knee pain for 2 weeks. Redness and swelling. Concern for septic arthritis. EXAM: BILATERAL KNEE ASPIRATION UNDER FLUOROSCOPY FLUOROSCOPY TIME:  Radiation Exposure Index (as provided by the fluoroscopic device): 5.73 microGray*m^2 PROCEDURE: Overlying skin prepped with Betadine, draped in the usual sterile fashion, and infiltrated locally with buffered Lidocaine. A 20 gauge spinal needle was advanced into the right knee joint using a lateral patellofemoral approach. A 30 cc syringe was attached. Aspiration yielded only 1 mL of serosanguineous fluid despite needle repositioning. Given this, 2 mL of  Omnipaque 300 was injected and confirmed intra-articular needle position. Subsequently, 10 mL of normal saline was injected, and approximately 5 mL of fluid was then reaspirated. The same procedure was then performed on the left knee, using a medial patellofemoral approach. Again, only approximately 0.5 mL of sanguinous fluid was initially aspirated. Injection of Omnipaque 300 confirmed intra-articular needle position, 10 mL of normal saline was injected, and approximately 4 mL of fluid was reaspirated. The initial aspirates from each knee were labeled "Aspirate" . The fluid aspirated after saline injection was collected separately and labeled "Flush" . These for samples were sent to the lab for the requested studies. The patient tolerated the procedure well. No immediate complication. IMPRESSION: Bilateral knee joint aspirations under fluoroscopy as above. Electronically Signed   By: Logan Bores M.D.   On: 01/07/2016 16:26   Dg Fluoro Guided Needle Plc Aspiration/injection Loc  01/07/2016  CLINICAL DATA:  Bilateral knee pain for 2 weeks. Redness and swelling. Concern for septic arthritis. EXAM: BILATERAL KNEE ASPIRATION UNDER FLUOROSCOPY FLUOROSCOPY TIME:  Radiation Exposure Index (as provided by the fluoroscopic device): 5.73 microGray*m^2 PROCEDURE: Overlying skin prepped with Betadine, draped in the usual sterile fashion, and infiltrated locally with buffered Lidocaine. A 20 gauge spinal needle was advanced into the right knee joint using a lateral patellofemoral approach. A 30 cc syringe was attached. Aspiration yielded only 1 mL of serosanguineous fluid despite needle repositioning. Given this, 2 mL of Omnipaque 300 was injected and confirmed intra-articular needle position. Subsequently, 10 mL of normal saline was injected, and approximately 5 mL of fluid was then reaspirated. The same procedure was then performed on the left knee, using a medial patellofemoral approach. Again, only approximately 0.5 mL of  sanguinous fluid was initially aspirated. Injection of Omnipaque 300 confirmed intra-articular needle position, 10 mL of normal saline was injected, and approximately 4 mL of fluid was reaspirated. The initial aspirates from each knee were labeled "Aspirate" . The fluid aspirated after saline injection was collected separately and labeled "Flush" . These for samples were sent to the lab for the requested studies. The patient tolerated the procedure well. No immediate complication. IMPRESSION: Bilateral knee joint aspirations under fluoroscopy as above. Electronically Signed   By: Logan Bores M.D.   On: 01/07/2016 16:26   Dg Hip Unilat With Pelvis 2-3 Views Right  01/26/2016  CLINICAL DATA:  Chronic anterior right hip pain.  Initial encounter. EXAM: DG HIP (WITH  OR WITHOUT PELVIS) 2-3V RIGHT COMPARISON:  None. FINDINGS: There is no evidence of fracture or dislocation. Both femoral heads are seated normally within their respective acetabula. The proximal right femur appears intact. Mild degenerative change is noted along the lower lumbar spine. The sacroiliac joints are unremarkable in appearance. The visualized bowel gas pattern is grossly unremarkable in appearance. Scattered vascular calcifications are seen. IMPRESSION: 1. No evidence of fracture or dislocation. 2. Scattered vascular calcifications seen. Electronically Signed   By: Garald Balding M.D.   On: 01/26/2016 18:21    2D Echo: none  Cardiac Cath: none  Admission HPI:  Ms. EZMAE SPEERS is 82 y.o. woman with an extensive past medical history that includes autoimmune hepatitis on chronic steroids, lower extremity lymphedema, recent Pseudomonal septic arthritis currently on Levaquin, CKD III, atrial fibrillation (on Eliquis and Tikosyn), chronic back pain, type 2 diabetes, coronary artery disease (s/p RCA, LCx DES) who comes in today from her SNF due to lower abdominal pain as well as feeling weak and tired since being discharged to her SNF.  States her abdominal pain began shortly after her discharge and has coincided with her weakness. Initially at discharge she had a decreased appetite for a few days but states that it is now better. Pain is located in the RLQ/hip area and started since discharge. She does not think it is worse with movement and she denies any trauma. She is unsure of any triggers to her pain. States it waxes and wanes. Described as achy and intermittently radiates to her left side. Rates as 3/10 currently, but 8/10 at its worst. She has been working with PT at her rehab facility but has been having difficulty due to her weakness. Additionally, she reports diarrhea 2-3 times per day since starting Levaquin that she describes as loose.   In the ED, patient had hemoglobin of 7.5 on CBC with i-Stat of 8.2 on recheck. She has had a chronic anemia dating back to 2010. Admitted 06/08/2015 - 06/13/2015 with hemoglobin ranging 8.5-10.6. Admitted 01/06/2016-01/10/2016 with hemoglobin ranging 9.7-10.2. Seen in the ED on 01/19/2016 for chest pain with hemoglobin 8.8 and discharged back to her SNF. She was FOBT negative and CT showed moderate stool within right colon and cecum. Mild distended small bowel loops in right lower quadrant terminal small bowel suspicious for ileus or early SBO. It also showed extensive degenerative changes of the lumbar spine and anasarca infiltration of subcutaneous fat bilateral flank and pelvic wall.  On review of systems, she denies nausea or vomiting, chest pain, dysuria, hematuria, hematemesis, new bruising, or syncope. She reports feeling lightheaded this morning, diarrhea as noted above and weakness/fatigue exacerbated by movement. She is unsure if she has had melena or blood in her stool.  Hospital Course by problem list: Active Problems:   Anemia   Symptomatic Anemia: patient improved this morning after receiving pRBC's overnight with stable hemoglobin this morning of 8.3. No evidence of  active bleeding with normal reticulocyte count and other anemia work-up unremarkable. B12 and folate normal, LDH normal. Ferritin was elevated suggestive of her anemia given CKD III. She does have a newer thrombocytopenia that may be due to her acute illness that she is recovering from  - will recommend weekly CBC at her skilled nursing facility - arrange for outpatient follow up with hematology to rule out developing MDS.  RLQ Abdominal Pain: pain is improved and x-ray of her hip was not remarkable. Likely secondary to constipation from early ileus given her CT  findings and hypoactive bowel sounds on exam. She is tolerating PO intake without nausea or vomiting. Given her high opiate burden, it will be important for her to maintain a healthy bowel regimen. - will send to SNF with Miralax BID and senna BID to ensure regular solid BM's, can taper off as needed  Hyperkalemia: 5.6 on admission, down to 5.0 this morning with no interventions - no changes on EKG - keep potassium above 4 on Tikosyn - magnesium normal - check BMET twice weekly as previously discussed from last discharge  Recent Pseudomonas Septic Arthritis: she is s/p knee washout bilaterally by orthopedic surgery and currently on Levaquin 781m Q48H. Currently afebrile and without leukocytosis. She reports no pain in her knees and has been doing well since surgery. - continue Levaquin through 01/31/16 - ID follow up  Atrial Fibrillation (CHADS-Vasc 6): Patient on chronic anticoagulation  - Dofetilide 125 mcg BID - Bisoprolol 5 mg daily - Continue Eliquis 2.519mdaily  CKD III: stable  DM2: Takes Regular Insulin 8U breakfast and lunch, 12-15U at dinner on a sliding scale. - continue   HTN - Losartan 25 mg daily.   Autoimmune Hepatitis: Has been on chronic steroids for many years. - Prednisone 10 mg daily  Chronic Back Pain: On chronic pain regimen - Morphine 60 mg 12 hr q8h prn - Oxycodone 5 mg BID as needed - bowel  regimen as above  Discharge Vitals:   BP 92/67 mmHg  Pulse 73  Temp(Src) 98.1 F (36.7 C) (Oral)  Resp 16  SpO2 96%  Discharge Labs:  Results for orders placed or performed during the hospital encounter of 01/26/16 (from the past 24 hour(s))  Vitamin B12     Status: None   Collection Time: 01/26/16  4:13 PM  Result Value Ref Range   Vitamin B-12 340 180 - 914 pg/mL  Folate     Status: None   Collection Time: 01/26/16  4:13 PM  Result Value Ref Range   Folate 28.8 >5.9 ng/mL  Iron and TIBC     Status: Abnormal   Collection Time: 01/26/16  4:13 PM  Result Value Ref Range   Iron 53 28 - 170 ug/dL   TIBC 227 (L) 250 - 450 ug/dL   Saturation Ratios 23 10.4 - 31.8 %   UIBC 174 ug/dL  Ferritin     Status: Abnormal   Collection Time: 01/26/16  4:13 PM  Result Value Ref Range   Ferritin 409 (H) 11 - 307 ng/mL  Reticulocytes     Status: Abnormal   Collection Time: 01/26/16  4:13 PM  Result Value Ref Range   Retic Ct Pct <0.4 (L) 0.4 - 3.1 %   RBC. 2.61 (L) 3.87 - 5.11 MIL/uL   Retic Count, Manual NOT CALCULATED 19.0 - 186.0 K/uL  TSH     Status: None   Collection Time: 01/26/16  4:13 PM  Result Value Ref Range   TSH 2.737 0.350 - 4.500 uIU/mL  Type and screen     Status: None   Collection Time: 01/26/16  4:20 PM  Result Value Ref Range   ABO/RH(D) O POS    Antibody Screen NEG    Sample Expiration 01/29/2016    Unit Number [id number]  Blood Component Type RED CELLS,LR    Unit division 00    Status of Unit ISSUED,FINAL    Transfusion Status OK TO TRANSFUSE    Crossmatch Result Compatible   Lactate dehydrogenase     Status:  None   Collection Time: 01/26/16  5:25 PM  Result Value Ref Range   LDH 126 98 - 192 U/L  Haptoglobin     Status: Abnormal   Collection Time: 01/26/16  5:25 PM  Result Value Ref Range   Haptoglobin 209 (H) 34 - 200 mg/dL  Urinalysis, Routine w reflex microscopic (not at [facility])     Status: Abnormal   Collection Time: 01/26/16  5:35 PM  Result  Value Ref Range   Color, Urine YELLOW YELLOW   APPearance CLEAR CLEAR   Specific Gravity, Urine 1.022 1.005 - 1.030   pH 5.5 5.0 - 8.0   Glucose, UA NEGATIVE NEGATIVE mg/dL   Hgb urine dipstick NEGATIVE NEGATIVE   Bilirubin Urine NEGATIVE NEGATIVE   Ketones, ur NEGATIVE NEGATIVE mg/dL   Protein, ur NEGATIVE NEGATIVE mg/dL   Nitrite NEGATIVE NEGATIVE   Leukocytes, UA SMALL (A) NEGATIVE  Urine microscopic-add on     Status: Abnormal   Collection Time: 01/26/16  5:35 PM  Result Value Ref Range   Squamous Epithelial / LPF 6-30 (A) NONE SEEN   WBC, UA 6-30 0 - 5 WBC/hpf   RBC / HPF 0-5 0 - 5 RBC/hpf   Bacteria, UA FEW (A) NONE SEEN  Prepare RBC     Status: None   Collection Time: 01/26/16  5:38 PM  Result Value Ref Range   Order Confirmation ORDER PROCESSED BY BLOOD BANK   MRSA PCR Screening     Status: None   Collection Time: 01/26/16  7:45 PM  Result Value Ref Range   MRSA by PCR NEGATIVE NEGATIVE  CBC     Status: Abnormal   Collection Time: 01/26/16  8:48 PM  Result Value Ref Range   WBC 5.3 4.0 - 10.5 K/uL   RBC 2.45 (L) 3.87 - 5.11 MIL/uL   Hemoglobin 6.9 (LL) 12.0 - 15.0 g/dL   HCT 22.0 (L) 36.0 - 46.0 %   MCV 89.8 78.0 - 100.0 fL   MCH 28.2 26.0 - 34.0 pg   MCHC 31.4 30.0 - 36.0 g/dL   RDW 20.5 (H) 11.5 - 15.5 %   Platelets 84 (L) 150 - 400 K/uL  Glucose, capillary     Status: Abnormal   Collection Time: 01/27/16 12:54 AM  Result Value Ref Range   Glucose-Capillary 317 (H) 65 - 99 mg/dL  Basic metabolic panel     Status: Abnormal   Collection Time: 01/27/16  2:05 AM  Result Value Ref Range   Sodium 137 135 - 145 mmol/L   Potassium 5.0 3.5 - 5.1 mmol/L   Chloride 105 101 - 111 mmol/L   CO2 27 22 - 32 mmol/L   Glucose, Bld 237 (H) 65 - 99 mg/dL   BUN 37 (H) 6 - 20 mg/dL   Creatinine, Ser 1.27 (H) 0.44 - 1.00 mg/dL   Calcium 7.6 (L) 8.9 - 10.3 mg/dL   GFR calc non Af Amer 39 (L) >60 mL/min   GFR calc Af Amer 45 (L) >60 mL/min   Anion gap 5 5 - 15  Troponin I (q  6hr x 3)     Status: None   Collection Time: 01/27/16  2:05 AM  Result Value Ref Range   Troponin I <0.03 <0.031 ng/mL  CBC with Differential/Platelet     Status: Abnormal   Collection Time: 01/27/16  2:05 AM  Result Value Ref Range   WBC 4.5 4.0 - 10.5 K/uL   RBC 3.03 (L) 3.87 - 5.11 MIL/uL  Hemoglobin 8.3 (L) 12.0 - 15.0 g/dL   HCT 26.3 (L) 36.0 - 46.0 %   MCV 86.8 78.0 - 100.0 fL   MCH 27.4 26.0 - 34.0 pg   MCHC 31.6 30.0 - 36.0 g/dL   RDW 19.3 (H) 11.5 - 15.5 %   Platelets 73 (L) 150 - 400 K/uL   Neutrophils Relative % 77 %   Neutro Abs 3.5 1.7 - 7.7 K/uL   Lymphocytes Relative 13 %   Lymphs Abs 0.6 (L) 0.7 - 4.0 K/uL   Monocytes Relative 10 %   Monocytes Absolute 0.5 0.1 - 1.0 K/uL   Eosinophils Relative 0 %   Eosinophils Absolute 0.0 0.0 - 0.7 K/uL   Basophils Relative 0 %   Basophils Absolute 0.0 0.0 - 0.1 K/uL  Troponin I     Status: None   Collection Time: 01/27/16  7:21 AM  Result Value Ref Range   Troponin I <0.03 <0.031 ng/mL  Glucose, capillary     Status: Abnormal   Collection Time: 01/27/16  8:28 AM  Result Value Ref Range   Glucose-Capillary 129 (H) 65 - 99 mg/dL  Magnesium     Status: None   Collection Time: 01/27/16 10:27 AM  Result Value Ref Range   Magnesium 2.1 1.7 - 2.4 mg/dL  Glucose, capillary     Status: Abnormal   Collection Time: 01/27/16 12:26 PM  Result Value Ref Range   Glucose-Capillary 168 (H) 65 - 99 mg/dL    Signed: Jule Ser, DO 01/27/2016, 2:18 PM    Services Ordered on Discharge: SNF Equipment Ordered on Discharge: none

## 2016-01-27 NOTE — Discharge Instructions (Signed)

## 2016-01-27 NOTE — Progress Notes (Signed)
Nutrition Brief Note  Patient identified on the Malnutrition Screening Tool (MST) Report  Wt Readings from Last 15 Encounters:  01/06/16 107 lb (48.535 kg)  11/28/15 109 lb (49.442 kg)  11/16/15 113 lb (51.256 kg)  10/26/15 108 lb (48.988 kg)  08/24/15 109 lb 6 oz (49.612 kg)  06/13/15 135 lb 9.3 oz (61.5 kg)  05/06/15 114 lb (51.71 kg)  03/07/15 131 lb (59.421 kg)  03/03/15 131 lb 4 oz (59.535 kg)  02/28/15 132 lb 4 oz (59.988 kg)  01/27/15 128 lb (58.06 kg)  11/17/14 129 lb 6.4 oz (58.695 kg)  11/10/14 127 lb (57.607 kg)  10/11/14 131 lb (59.421 kg)  09/13/14 130 lb (58.968 kg)   [name] is 82 y.o. woman with an extensive past medical history that includes autoimmune hepatitis on chronic steroids, lower extremity lymphedema, recent Pseudomonal septic arthritis currently on Levaquin, CKD III, atrial fibrillation (on Eliquis and Tikosyn), chronic back pain, type 2 diabetes, coronary artery disease (s/p RCA, LCx DES) who comes in today from her SNF due to lower abdominal pain as well as feeling weak and tired since being discharged to her SNF.  Spoke with pt at bedside. She refused RD interview and exam, reporting "there's nothing you can do for me. I'm going home after lunch today".   Pt is familiar to this RD due to previous admissions. Pt with hx of poor appetite and weight loss.  Pt underwent blood transfusion on 01/27/16. She receives Glucerna shake BID, however, usually does not accept them. Discussed importance of good meal and supplement intake to promote healing.   Estimated body mass index is 20.23 kg/(m^2) as calculated from the following:   Height as of 01/19/16: 5\' 1"  (1.549 m).   Weight as of 01/06/16: 107 lb (48.535 kg). Patient meets criteria for normal weight range based on current BMI.   Current diet order is carb modified, patient is consuming approximately 75% of meals at this time. Labs and medications reviewed.   Please re-consult RD if further  nutrition interventions arise prior to discharge. Noted discharge orders back to SNF after RD visit.   Cynthia Hicks A. Jimmye Norman, RD, LDN, CDE Pager: [phone] After hours Pager: [phone]

## 2016-01-30 LAB — PATHOLOGIST SMEAR REVIEW

## 2016-02-07 ENCOUNTER — Encounter (HOSPITAL_COMMUNITY): Payer: Self-pay | Admitting: *Deleted

## 2016-02-07 ENCOUNTER — Inpatient Hospital Stay (HOSPITAL_COMMUNITY)
Admission: EM | Admit: 2016-02-07 | Discharge: 2016-02-09 | DRG: 314 | Disposition: A | Discharge status: Skilled Nursing Facility | Payer: Medicare Other | Attending: Internal Medicine | Admitting: Internal Medicine

## 2016-02-07 DIAGNOSIS — I480 Paroxysmal atrial fibrillation: Secondary | ICD-10-CM | POA: Diagnosis present

## 2016-02-07 DIAGNOSIS — K754 Autoimmune hepatitis: Secondary | ICD-10-CM | POA: Diagnosis present

## 2016-02-07 DIAGNOSIS — L89890 Pressure ulcer of other site, unstageable: Secondary | ICD-10-CM | POA: Diagnosis present

## 2016-02-07 DIAGNOSIS — Z955 Presence of coronary angioplasty implant and graft: Secondary | ICD-10-CM

## 2016-02-07 DIAGNOSIS — E43 Unspecified severe protein-calorie malnutrition: Secondary | ICD-10-CM | POA: Diagnosis present

## 2016-02-07 DIAGNOSIS — E1122 Type 2 diabetes mellitus with diabetic chronic kidney disease: Secondary | ICD-10-CM | POA: Diagnosis present

## 2016-02-07 DIAGNOSIS — E875 Hyperkalemia: Secondary | ICD-10-CM | POA: Diagnosis present

## 2016-02-07 DIAGNOSIS — L89322 Pressure ulcer of left buttock, stage 2: Secondary | ICD-10-CM | POA: Diagnosis present

## 2016-02-07 DIAGNOSIS — L89610 Pressure ulcer of right heel, unstageable: Secondary | ICD-10-CM | POA: Diagnosis present

## 2016-02-07 DIAGNOSIS — L89150 Pressure ulcer of sacral region, unstageable: Secondary | ICD-10-CM | POA: Diagnosis present

## 2016-02-07 DIAGNOSIS — E785 Hyperlipidemia, unspecified: Secondary | ICD-10-CM | POA: Diagnosis present

## 2016-02-07 DIAGNOSIS — I5032 Chronic diastolic (congestive) heart failure: Secondary | ICD-10-CM | POA: Diagnosis present

## 2016-02-07 DIAGNOSIS — E11621 Type 2 diabetes mellitus with foot ulcer: Secondary | ICD-10-CM | POA: Diagnosis present

## 2016-02-07 DIAGNOSIS — N179 Acute kidney failure, unspecified: Secondary | ICD-10-CM | POA: Diagnosis present

## 2016-02-07 DIAGNOSIS — E861 Hypovolemia: Secondary | ICD-10-CM | POA: Diagnosis present

## 2016-02-07 DIAGNOSIS — I2510 Atherosclerotic heart disease of native coronary artery without angina pectoris: Secondary | ICD-10-CM | POA: Diagnosis present

## 2016-02-07 DIAGNOSIS — I959 Hypotension, unspecified: Secondary | ICD-10-CM | POA: Diagnosis present

## 2016-02-07 DIAGNOSIS — E1140 Type 2 diabetes mellitus with diabetic neuropathy, unspecified: Secondary | ICD-10-CM | POA: Diagnosis present

## 2016-02-07 DIAGNOSIS — M549 Dorsalgia, unspecified: Secondary | ICD-10-CM | POA: Diagnosis present

## 2016-02-07 DIAGNOSIS — I5042 Chronic combined systolic (congestive) and diastolic (congestive) heart failure: Secondary | ICD-10-CM | POA: Diagnosis present

## 2016-02-07 DIAGNOSIS — Z681 Body mass index (BMI) 19 or less, adult: Secondary | ICD-10-CM | POA: Diagnosis not present

## 2016-02-07 DIAGNOSIS — L89312 Pressure ulcer of right buttock, stage 2: Secondary | ICD-10-CM | POA: Diagnosis present

## 2016-02-07 DIAGNOSIS — I482 Chronic atrial fibrillation: Secondary | ICD-10-CM | POA: Diagnosis not present

## 2016-02-07 DIAGNOSIS — Z794 Long term (current) use of insulin: Secondary | ICD-10-CM

## 2016-02-07 DIAGNOSIS — E119 Type 2 diabetes mellitus without complications: Secondary | ICD-10-CM

## 2016-02-07 DIAGNOSIS — I130 Hypertensive heart and chronic kidney disease with heart failure and stage 1 through stage 4 chronic kidney disease, or unspecified chronic kidney disease: Secondary | ICD-10-CM | POA: Diagnosis present

## 2016-02-07 DIAGNOSIS — N184 Chronic kidney disease, stage 4 (severe): Secondary | ICD-10-CM | POA: Diagnosis present

## 2016-02-07 DIAGNOSIS — R001 Bradycardia, unspecified: Secondary | ICD-10-CM | POA: Diagnosis present

## 2016-02-07 DIAGNOSIS — R9431 Abnormal electrocardiogram [ECG] [EKG]: Secondary | ICD-10-CM | POA: Diagnosis not present

## 2016-02-07 DIAGNOSIS — L89620 Pressure ulcer of left heel, unstageable: Secondary | ICD-10-CM | POA: Diagnosis present

## 2016-02-07 DIAGNOSIS — Z85820 Personal history of malignant melanoma of skin: Secondary | ICD-10-CM

## 2016-02-07 DIAGNOSIS — D649 Anemia, unspecified: Secondary | ICD-10-CM

## 2016-02-07 DIAGNOSIS — Z7983 Long term (current) use of bisphosphonates: Secondary | ICD-10-CM

## 2016-02-07 DIAGNOSIS — Z79891 Long term (current) use of opiate analgesic: Secondary | ICD-10-CM

## 2016-02-07 DIAGNOSIS — N189 Chronic kidney disease, unspecified: Secondary | ICD-10-CM | POA: Diagnosis present

## 2016-02-07 DIAGNOSIS — G8929 Other chronic pain: Secondary | ICD-10-CM | POA: Diagnosis present

## 2016-02-07 DIAGNOSIS — Z7901 Long term (current) use of anticoagulants: Secondary | ICD-10-CM

## 2016-02-07 DIAGNOSIS — D638 Anemia in other chronic diseases classified elsewhere: Secondary | ICD-10-CM | POA: Diagnosis present

## 2016-02-07 DIAGNOSIS — L8992 Pressure ulcer of unspecified site, stage 2: Secondary | ICD-10-CM | POA: Diagnosis present

## 2016-02-07 HISTORY — DX: Anemia, unspecified: D64.9

## 2016-02-07 LAB — BASIC METABOLIC PANEL
Anion gap: 6 (ref 5–15)
BUN: 31 mg/dL — AB (ref 6–20)
CALCIUM: 7.2 mg/dL — AB (ref 8.9–10.3)
CHLORIDE: 98 mmol/L — AB (ref 101–111)
CO2: 28 mmol/L (ref 22–32)
CREATININE: 1.85 mg/dL — AB (ref 0.44–1.00)
GFR calc Af Amer: 29 mL/min — ABNORMAL LOW (ref >60)
GFR calc non Af Amer: 25 mL/min — ABNORMAL LOW (ref >60)
GLUCOSE: 124 mg/dL — AB (ref 65–99)
Potassium: 6 mmol/L — ABNORMAL HIGH (ref 3.5–5.1)
Sodium: 132 mmol/L — ABNORMAL LOW (ref 135–145)

## 2016-02-07 LAB — COMPREHENSIVE METABOLIC PANEL
ALBUMIN: 2.2 g/dL — AB (ref 3.5–5.0)
ALK PHOS: 70 U/L (ref 38–126)
ALT: 13 U/L — AB (ref 14–54)
ANION GAP: 10 (ref 5–15)
AST: 27 U/L (ref 15–41)
BILIRUBIN TOTAL: 0.6 mg/dL (ref 0.3–1.2)
BUN: 32 mg/dL — AB (ref 6–20)
CALCIUM: 7.1 mg/dL — AB (ref 8.9–10.3)
CO2: 23 mmol/L (ref 22–32)
CREATININE: 1.93 mg/dL — AB (ref 0.44–1.00)
Chloride: 98 mmol/L — ABNORMAL LOW (ref 101–111)
GFR calc Af Amer: 27 mL/min — ABNORMAL LOW (ref >60)
GFR calc non Af Amer: 23 mL/min — ABNORMAL LOW (ref >60)
GLUCOSE: 150 mg/dL — AB (ref 65–99)
Potassium: 5.7 mmol/L — ABNORMAL HIGH (ref 3.5–5.1)
Sodium: 131 mmol/L — ABNORMAL LOW (ref 135–145)
TOTAL PROTEIN: 4.4 g/dL — AB (ref 6.5–8.1)

## 2016-02-07 LAB — BRAIN NATRIURETIC PEPTIDE: B Natriuretic Peptide: 939.6 pg/mL — ABNORMAL HIGH (ref 0.0–100.0)

## 2016-02-07 LAB — TROPONIN I
Troponin I: <0.03 ng/mL (ref <0.03)
Troponin I: <0.03 ng/mL (ref <0.03)

## 2016-02-07 LAB — CBC WITH DIFFERENTIAL/PLATELET
Basophils Absolute: 0 10*3/uL (ref 0.0–0.1)
Basophils Relative: 0 %
EOS ABS: 0 10*3/uL (ref 0.0–0.7)
EOS PCT: 0 %
HEMATOCRIT: 24.2 % — AB (ref 36.0–46.0)
Hemoglobin: 7.7 g/dL — ABNORMAL LOW (ref 12.0–15.0)
LYMPHS ABS: 0.4 10*3/uL — AB (ref 0.7–4.0)
LYMPHS PCT: 9 %
MCH: 28.9 pg (ref 26.0–34.0)
MCHC: 31.8 g/dL (ref 30.0–36.0)
MCV: 91 fL (ref 78.0–100.0)
MONO ABS: 0.2 10*3/uL (ref 0.1–1.0)
Monocytes Relative: 5 %
Neutro Abs: 3.9 10*3/uL (ref 1.7–7.7)
Neutrophils Relative %: 86 %
Platelets: 139 10*3/uL — ABNORMAL LOW (ref 150–400)
RBC: 2.66 MIL/uL — ABNORMAL LOW (ref 3.87–5.11)
RDW: 19.5 % — ABNORMAL HIGH (ref 11.5–15.5)
WBC: 4.5 10*3/uL (ref 4.0–10.5)

## 2016-02-07 LAB — CBC
HCT: 28.5 % — ABNORMAL LOW (ref 36.0–46.0)
Hemoglobin: 9 g/dL — ABNORMAL LOW (ref 12.0–15.0)
MCH: 27.4 pg (ref 26.0–34.0)
MCHC: 31.6 g/dL (ref 30.0–36.0)
MCV: 86.9 fL (ref 78.0–100.0)
PLATELETS: 131 10*3/uL — AB (ref 150–400)
RBC: 3.28 MIL/uL — AB (ref 3.87–5.11)
RDW: 18.7 % — ABNORMAL HIGH (ref 11.5–15.5)
WBC: 3.7 10*3/uL — ABNORMAL LOW (ref 4.0–10.5)

## 2016-02-07 LAB — FUNGUS CULTURE WITH STAIN

## 2016-02-07 LAB — FUNGAL ORGANISM REFLEX

## 2016-02-07 LAB — MRSA PCR SCREENING: MRSA BY PCR: NEGATIVE

## 2016-02-07 LAB — I-STAT CG4 LACTIC ACID, ED: Lactic Acid, Venous: 1.85 mmol/L (ref 0.5–1.9)

## 2016-02-07 LAB — PREPARE RBC (CROSSMATCH)

## 2016-02-07 LAB — MAGNESIUM: MAGNESIUM: 2.1 mg/dL (ref 1.7–2.4)

## 2016-02-07 LAB — PROTIME-INR
INR: 1.41 (ref 0.00–1.49)
Prothrombin Time: 17.4 seconds — ABNORMAL HIGH (ref 11.6–15.2)

## 2016-02-07 LAB — LACTIC ACID, PLASMA
Lactic Acid, Venous: 1.9 mmol/L (ref 0.5–1.9)
Lactic Acid, Venous: 2 mmol/L (ref 0.5–1.9)

## 2016-02-07 LAB — FUNGUS CULTURE RESULT

## 2016-02-07 MED ORDER — ACETAMINOPHEN 650 MG RE SUPP: 650.0000 mg | Freq: Four times a day (QID) | RECTAL | Status: DC | PRN

## 2016-02-07 MED ORDER — PREDNISONE 10 MG PO TABS
10.0000 mg | ORAL_TABLET | Freq: Every day | ORAL | Status: DC
  Administered 2016-02-08 – 2016-02-09 (×2): 10 mg via ORAL
  Filled 2016-02-07 (×2): qty 1

## 2016-02-07 MED ORDER — SODIUM CHLORIDE 0.9% FLUSH
3.0000 mL | Freq: Two times a day (BID) | INTRAVENOUS | Status: DC
Start: 2016-02-07 — End: 2016-02-09
  Administered 2016-02-08 (×2): 3 mL via INTRAVENOUS

## 2016-02-07 MED ORDER — PANTOPRAZOLE SODIUM 40 MG PO TBEC
40.0000 mg | DELAYED_RELEASE_TABLET | Freq: Every day | ORAL | Status: DC
  Administered 2016-02-08 – 2016-02-09 (×2): 40 mg via ORAL
  Filled 2016-02-07 (×2): qty 1

## 2016-02-07 MED ORDER — SODIUM CHLORIDE 0.9 % IV SOLN
Freq: Once | INTRAVENOUS | Status: AC
  Administered 2016-02-07: 17:00:00 via INTRAVENOUS

## 2016-02-07 MED ORDER — PRAVASTATIN SODIUM 40 MG PO TABS
80.0000 mg | ORAL_TABLET | Freq: Every evening | ORAL | Status: DC
  Administered 2016-02-07 – 2016-02-08 (×2): 80 mg via ORAL
  Filled 2016-02-07 (×2): qty 2

## 2016-02-07 MED ORDER — INSULIN ASPART 100 UNIT/ML ~~LOC~~ SOLN
0.0000 [IU] | Freq: Three times a day (TID) | SUBCUTANEOUS | Status: DC
  Administered 2016-02-08: 7 [IU] via SUBCUTANEOUS

## 2016-02-07 MED ORDER — ENOXAPARIN SODIUM 30 MG/0.3ML ~~LOC~~ SOLN
30.0000 mg | SUBCUTANEOUS | Status: DC
  Administered 2016-02-07: 30 mg via SUBCUTANEOUS
  Filled 2016-02-07: qty 0.3

## 2016-02-07 MED ORDER — POLYVINYL ALCOHOL 1.4 % OP SOLN
1.0000 [drp] | Freq: Every day | OPHTHALMIC | Status: DC | PRN
  Filled 2016-02-07: qty 15

## 2016-02-07 MED ORDER — ACETAMINOPHEN 325 MG PO TABS
650.0000 mg | ORAL_TABLET | Freq: Four times a day (QID) | ORAL | Status: DC | PRN
  Administered 2016-02-08: 650 mg via ORAL
  Filled 2016-02-07 (×2): qty 2

## 2016-02-07 MED ORDER — ADULT MULTIVITAMIN W/MINERALS CH
1.0000 | ORAL_TABLET | Freq: Every day | ORAL | Status: DC
  Administered 2016-02-08 – 2016-02-09 (×2): 1 via ORAL
  Filled 2016-02-07 (×3): qty 1

## 2016-02-07 MED ORDER — SODIUM POLYSTYRENE SULFONATE 15 GM/60ML PO SUSP
15.0000 g | Freq: Once | ORAL | Status: AC
  Administered 2016-02-07: 15 g via ORAL
  Filled 2016-02-07: qty 60

## 2016-02-07 MED ORDER — CLOTRIMAZOLE 10 MG MT TROC
10.0000 mg | Freq: Every day | OROMUCOSAL | Status: DC
  Administered 2016-02-07 – 2016-02-09 (×3): 10 mg via ORAL
  Filled 2016-02-07 (×10): qty 1

## 2016-02-07 MED ORDER — INSULIN ASPART 100 UNIT/ML ~~LOC~~ SOLN: 0.0000 [IU] | Freq: Every day | SUBCUTANEOUS | Status: DC

## 2016-02-07 MED ORDER — VITAMIN D 1000 UNITS PO TABS
2000.0000 [IU] | ORAL_TABLET | Freq: Every day | ORAL | Status: DC
  Administered 2016-02-07 – 2016-02-08 (×2): 2000 [IU] via ORAL
  Filled 2016-02-07 (×2): qty 2

## 2016-02-07 NOTE — ED Notes (Signed)
Attempted report 

## 2016-02-07 NOTE — ED Provider Notes (Signed)
CSN: [id number]     Arrival date & time 02/07/16  1458 History   First MD Initiated Contact with Patient 02/07/16 1542     Chief Complaint  Patient presents with  . Bradycardia  . Hypotension  . Emesis     (Consider location/radiation/quality/duration/timing/severity/associated sxs/prior Treatment) Patient is a 82 y.o. female presenting with general illness. The history is provided by the EMS personnel and the patient.  Illness Location:  Bradycardia to 30s causing hypotension to 70s/30s Quality:  No assiated chest pain or shortness of breath. No change in mental status.  Severity:  Moderate Onset quality:  Unable to specify Timing:  Constant Progression:  Waxing and waning Chronicity:  Recurrent Context:  Systolic CHF Associated symptoms: diarrhea (daily for ~1 month), fatigue, nausea and vomiting   Associated symptoms: no abdominal pain, no chest pain, no congestion, no cough, no fever, no loss of consciousness, no rash and no shortness of breath  Sore throat: x1 yesterday.     Past Medical History  Diagnosis Date  . Hypertension   . Arthritis   . Diabetes mellitus   . Coronary artery disease     a.  MI 01/2003;  b.  02/2006 NSTEMI - LAD 80-37m, LCX nl, RCA 75m - RCA stented w/ Taxus DES;   c.  2009 Neg MV;  d. 11/2010 Echo - EF 65%;  e. 02/2013 MV: Carlton Adam Cardiolite that was low risk with no ischemia.  . Autoimmune liver disease     a.  Followed @ [facility] - on chronic prednisone and  sirolimus  . Hyperlipidemia     a. inability to stake statins due to liver disease  . Cellulitis     a. chronic cellulitis of LE  . CKD (chronic kidney disease), stage IV ([place])   . Bulging disc   . Sciatica   . Chronic back pain     a.  On Methadone  . Skin cancer (melanoma) ([place])   . Esophageal stricture     narrowing  . Diabetic neuropathy ([place])   . Sepsis ([place])   . PAF (paroxysmal atrial fibrillation) ([place])     a. Tikosyn initiated 10/2014;  CHA2DS2VASc = 6 (eliquis).  . Chronic  diastolic CHF (congestive heart failure) ([place])     a. 06/2014 TEE: EF 60-65%, Gr 2 DD, mild MR, mildly dil LA, Grade IV descending thoracic Ao plaque.  Marland Kitchen PAD (peripheral artery disease) ([place])   . Chronic acquired lymphedema    Past Surgical History  Procedure Laterality Date  . Cardiac catheterization      Normal EF  . Coronary angioplasty  2007    2 DES placement to RCA complicated by a wire perforation  . Tubal ligation  1982  . Elbow surgery Left 1994    neuroma removed   . Abscess drainage Right     side  . Lamenectomy  1989    only 1 disc surgery  . Heart stents  2007  and  2012    X's 1  and  2012 X's 2 stents  . Cardioversion N/A 10/31/2012    Procedure: CARDIOVERSION;  Surgeon: Larey Dresser, MD;  Location: [facility] ENDOSCOPY;  Service: Cardiovascular;  Laterality: N/A;  . Tee without cardioversion N/A 06/22/2014    Procedure: TRANSESOPHAGEAL ECHOCARDIOGRAM (TEE);  Surgeon: Larey Dresser, MD;  Location: [place] [facility];  Service: Cardiovascular;  Laterality: N/A;  . Left heart catheterization with coronary angiogram N/A 08/29/2011    Procedure: LEFT HEART CATHETERIZATION WITH CORONARY ANGIOGRAM;  Surgeon: Wellington Hampshire, MD;  Location: [facility] CATH LAB;  Service: Cardiovascular;  Laterality: N/A;  . Percutaneous coronary stent intervention (pci-s)  08/29/2011    Procedure: PERCUTANEOUS CORONARY STENT INTERVENTION (PCI-S);  Surgeon: Wellington Hampshire, MD;  Location: [facility] CATH LAB;  Service: Cardiovascular;;  . Knee arthroscopy Bilateral 01/08/2016    Procedure: ARTHROSCOPY KNEE;  Surgeon: Newt Minion, MD;  Location: [place];  Service: Orthopedics;  Laterality: Bilateral;   Family History  Problem Relation Age of Onset  . Lung cancer Mother 56  . Heart attack Father 64  . Heart disease Father   . Congestive Heart Failure Father   . Heart disease Maternal Grandfather   . Hypertension Brother   . Hypertension Son   . Pancreatic cancer Mother    Social History  Substance Use Topics  .  Smoking status: Never Smoker   . Smokeless tobacco: Never Used  . Alcohol Use: No   OB History    No data available     Review of Systems  Constitutional: Positive for fatigue. Negative for fever.  HENT: Negative for congestion. Sore throat: x1 yesterday.   Eyes: Negative for visual disturbance.  Respiratory: Negative for cough and shortness of breath.   Cardiovascular: Negative for chest pain.  Gastrointestinal: Positive for nausea, vomiting and diarrhea (daily for ~1 month). Negative for abdominal pain.  Genitourinary: Negative for dysuria and flank pain.  Musculoskeletal: Negative for gait problem.  Skin: Negative for rash and wound.  Neurological: Negative for loss of consciousness.  Psychiatric/Behavioral: Negative for confusion and agitation.      Allergies  Cyclosporine; Azathioprine; Mycophenolate; Other; and Oysters  Home Medications   Prior to Admission medications   Medication Sig Start Date End Date Taking? Authorizing Provider  acetaminophen (TYLENOL) 325 MG tablet Take 2 tablets (650 mg total) by mouth every 6 (six) hours as needed for mild pain (or Fever >/= 101). 01/10/16  Yes Liberty Handy, MD  alendronate (FOSAMAX) 70 MG tablet Take 70 mg by mouth every Monday. Take with a full glass of water on an empty stomach.   Yes Historical Provider, MD  apixaban (ELIQUIS) 2.5 MG TABS tablet Take 1 tablet (2.5 mg total) by mouth 2 (two) times daily. 03/07/15  Yes Larey Dresser, MD  Ascorbic Acid (VITAMIN C) 1000 MG tablet Take 1,000 mg by mouth 2 (two) times daily.    Yes Historical Provider, MD  bisoprolol (ZEBETA) 5 MG tablet Take 1 tablet (5 mg total) by mouth daily. 05/06/15  Yes Larey Dresser, MD  Cholecalciferol (VITAMIN D PO) Take 2,000 Units by mouth at bedtime.    Yes Historical Provider, MD  clotrimazole (MYCELEX) 10 MG troche Take 10 mg by mouth 5 (five) times daily.   Yes Historical Provider, MD  dofetilide (TIKOSYN) 125 MCG capsule Take 1 capsule (125 mcg total)  by mouth 2 (two) times daily. 12/16/15  Yes Larey Dresser, MD  gabapentin (NEURONTIN) 100 MG capsule Take 100 mg by mouth 3 (three) times daily.   Yes Historical Provider, MD  Insulin Regular Human (RELION R IJ) Inject 4-16 Units as directed 3 (three) times daily. BS 120-200=4 units, 201-250=6 units, 251-300=8 units, 301-350=10 units, 351-400=14 units, 401-450=16 units.   Yes Historical Provider, MD  levofloxacin (LEVAQUIN) 750 MG tablet Take 750 mg by mouth every other day.   Yes Historical Provider, MD  losartan (COZAAR) 25 MG tablet TAKE 1 TABLET BY MOUTH EVERY DAY 11/30/15  Yes Larey Dresser, MD  magnesium oxide (MAG-OX) 400 (241.3 MG) MG tablet Take 1 tablet (400 mg total) by mouth daily. 10/22/14  Yes Rhonda G Barrett, PA-C  morphine (MS CONTIN) 60 MG 12 hr tablet Take 1 tablet (60 mg total) by mouth every 8 (eight) hours. 01/27/16  Yes Jule Ser, DO  Multiple Vitamin (MULTIVITAMIN) tablet Take 1 tablet by mouth daily.   Yes Historical Provider, MD  nitroGLYCERIN (NITROSTAT) 0.4 MG SL tablet Place 1 tablet (0.4 mg total) under the tongue every 5 (five) minutes as needed for chest pain. 09/01/11  Yes Rogelia Mire, NP  omeprazole (PRILOSEC) 20 MG capsule Take 20 mg by mouth daily.  06/06/14  Yes Historical Provider, MD  ondansetron (ZOFRAN) 8 MG tablet Take 8 mg by mouth every 8 (eight) hours as needed for nausea or vomiting.   Yes Historical Provider, MD  oxyCODONE (OXY IR/ROXICODONE) 5 MG immediate release tablet Take 1 tablet (5 mg total) by mouth 2 (two) times daily as needed for moderate pain. 01/27/16  Yes Jule Ser, DO  polyethylene glycol (MIRALAX / GLYCOLAX) packet Take 17 g by mouth 2 (two) times daily. 01/27/16  Yes Jule Ser, DO  polyvinyl alcohol (LIQUIFILM TEARS) 1.4 % ophthalmic solution Place 1 drop into both eyes daily as needed for dry eyes.   Yes Historical Provider, MD  pravastatin (PRAVACHOL) 80 MG tablet Take 1 tablet (80 mg total) by mouth every evening.  10/11/14  Yes Larey Dresser, MD  predniSONE (DELTASONE) 10 MG tablet Take 10 mg by mouth daily.    Yes Historical Provider, MD  senna (SENOKOT) 8.6 MG TABS tablet Take 1 tablet (8.6 mg total) by mouth 2 (two) times daily. 01/27/16  Yes Jule Ser, DO   BP 85/41 mmHg  Pulse 60  Temp(Src) 99.9 F (37.7 C) (Rectal)  Resp 18  Ht 5\' 1"  (1.549 m)  Wt 45.36 kg  BMI 18.90 kg/m2  SpO2 83% Physical Exam  Constitutional: She is oriented to person, place, and time. She appears well-developed and well-nourished. No distress.  HENT:  Head: Normocephalic and atraumatic.  Eyes: Conjunctivae are normal.  Cardiovascular: Normal heart sounds.   No murmur heard. Bradycardic  Pulmonary/Chest: Effort normal and breath sounds normal. No respiratory distress.  Abdominal: Soft. There is no tenderness.  Neurological: She is alert and oriented to person, place, and time.  Skin: Skin is warm. She is not diaphoretic.  Psychiatric: She has a normal mood and affect. Her behavior is normal.  Nursing note and vitals reviewed.   ED Course  Procedures (including critical care time) Labs Review Labs Reviewed  CBC WITH DIFFERENTIAL/PLATELET - Abnormal; Notable for the following:    RBC 2.66 (*)    Hemoglobin 7.7 (*)    HCT 24.2 (*)    RDW 19.5 (*)    Platelets 139 (*)    Lymphs Abs 0.4 (*)    All other components within normal limits  COMPREHENSIVE METABOLIC PANEL  BRAIN NATRIURETIC PEPTIDE  TROPONIN I  LACTIC ACID, PLASMA  LACTIC ACID, PLASMA  I-STAT CG4 LACTIC ACID, ED  TYPE AND SCREEN    Imaging Review No results found. I have personally reviewed and evaluated these images and lab results as part of my medical decision-making.   EKG Interpretation   Date/Time:  Tuesday February 07 2016 15:12:09 EDT Ventricular Rate:  62 PR Interval:    QRS Duration: 110 QT Interval:  574 QTC Calculation: 583 R Axis:   -92 Text Interpretation:  Atrial fibrillation Left anterior fascicular  block  Probable  anterolateral infarct, old Borderline T abnormalities, inferior  leads Prolonged QT interval When compared with ECG of 01/26/2016, Atrial  fibrillation has replaced Sinus rhythm HEART RATE has decreased QT has  lengthened Confirmed by Roxanne Mins  MD, Cynthia ([id number]) on 02/07/2016 3:40:00 PM      MDM   Final diagnoses:  None    Ms. Mccreless is an 82 year old female with past medical history of systolic heart failure with last EF 45% (per patient account) approximately 3 years ago, presenting for bradycardia into the 30s with associated hypotension to 70s over 30s. The patient has felt fatigued for the last 4-5 days. She was admitted approximately one week ago when she received a blood transfusion for hemoglobin of 6.9.  She lives in a nursing home attempting to rehabilitation after bilateral knee surgery approximately one month ago. She states she's had diarrhea up to 3 times a day for almost the entire stay in the nursing facility. She vomited once yesterday after becoming nauseous. She's had no fever or chills. She does not know of any sick contacts. She thinks it may have been something she ate that caused her to vomit.   On arrival patient has blood pressure 88/28 with a pulse of 57. Temperature 99.9 rectal. EKG shows slow atrial fibrillation with ventricular rate 62 and prolonged QT interval.  Patient continued to mentate clearly throughout her stay in the emergency department. Lactate elevated at 2.0. Patient given 1 unit of crossmatched blood for hypotension. Pre-transfusion hemoglobin of 7.7.  Bedside ultrasound shows no evidence of pericardial effusion with ejection fraction approximately 30%. Limited views secondary to patient positioning. Unable to lay flat due to pain from chronic gluteal wound.  Patient admitted to the stepdown unit in fair condition for further workup of bradycardia with hypotension and acute kidney injury. Patient seen with Dr.Glick  Cynthia Bossier, MD [id number]  [id number]  Cynthia Fuel, MD [id number]

## 2016-02-07 NOTE — ED Notes (Addendum)
Pt here from [facility] via [facility] for bp of 70/24 and HR of 34 (sinus brady).  [facility] initially called for vomiting and bp in 80's.  CBG 220.  Pt was given blood transfusion last week.  211ml ns given en-route.

## 2016-02-07 NOTE — H&P (Addendum)
Date: 02/07/2016               Patient Name:  Cynthia Hicks MRN: [id number]  DOB: 26-Sep-1932 Age / Sex: 82 y.o., female   PCP: Raelyn Number, MD         Medical Service: Internal Medicine Teaching Service         Attending Physician: Dr. Bartholomew Crews, MD    First Contact: Dr. Asencion Partridge Pager: [phone]  Second Contact: Dr. 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   She presented from [facility] via EMS for bp 70/24 and HR 34, EMS was initially called for vomiting and hypotension of bp in the 80s. The patient states that she has been feeling quite tired for the past month at her rehab center which she feels is due to her anemia (a new problem for her, s/p blood transfusion last admission) and ongoing diarrhea for the past few weeks. She states that she has 2-3 loose stools per day. She awoke this morning and felt her usual fatigue, but this profound weakness progressed as the day wore on - and she was resting in her recliner and "couldn't seem to keep my eyes open" when people noticed her condition. She says she also has been taking levaquin for recent bilateral knee septic arthritis requiring surgery. She  denies chest pain, dyspnea, or new sources of pain other than her chronic back pain.  In the ED she was found to have vitals:  BP 85/41 mmHg  Pulse 60  Temp(Src) 99.9 F (37.7 C) (Rectal)  Resp 18  Ht 5\' 1"  (1.549 m)  Wt 45.36 kg  BMI 18.90 kg/m2   SpO2 83%  Her O2sat improved to 100% on 2 L and her BP improved to 90s/40s after a 250cc bolus and ongoing 1 unit pRBCs transfusion. Her labs were significant for abnormalities:  Na 131, K 5.7, Cl 98, Cr 1.93 (from 1.27), GFR 23, Albumin 2.2, BNP 939, and Hb 7.7.  EKG read as possible:  Left anterior fascicular block  Probable anterolateral infarct, old  Borderline T abnormalities, inferior leads  Prolonged QT interval  When compared with ECG of 01/26/2016,  Atrial fibrillation has replaced Sinus rhythm  HEART RATE has decreased  QT has lengthened  Meds: Current Facility-Administered Medications  Medication Dose Route Frequency Provider Last Rate Last Dose  . insulin aspart (novoLOG) injection 0-5 Units  0-5 Units Subcutaneous QHS Milagros Loll, MD      . Derrill Memo ON 02/08/2016] insulin aspart (novoLOG) injection 0-9 Units  0-9 Units Subcutaneous TID WC Milagros Loll, MD       Current Outpatient Prescriptions  Medication Sig Dispense Refill  . acetaminophen (TYLENOL) 325 MG tablet Take 2 tablets (650 mg total) by mouth every 6 (six) hours as needed for mild pain (or Fever >/= 101). 30 tablet 3  . alendronate (FOSAMAX) 70 MG tablet Take 70 mg by mouth every Monday. Take with a full glass of water  on an empty stomach.    Marland Kitchen apixaban (ELIQUIS) 2.5 MG TABS tablet Take 1 tablet (2.5 mg total) by mouth 2 (two) times daily. 180 tablet 3  . Ascorbic Acid (VITAMIN C) 1000 MG tablet Take 1,000 mg by mouth 2 (two) times daily.     . bisoprolol (ZEBETA) 5 MG tablet Take 1 tablet (5 mg total) by mouth daily. 90 tablet 3  . Cholecalciferol (VITAMIN D PO) Take 2,000 Units by mouth at bedtime.     . clotrimazole (MYCELEX) 10 MG troche Take 10 mg by mouth 5 (five) times daily.    Marland Kitchen dofetilide (TIKOSYN) 125 MCG capsule Take 1 capsule (125 mcg total) by mouth 2 (two) times daily. 180 capsule 3  . gabapentin (NEURONTIN) 100 MG capsule Take 100 mg by mouth 3 (three) times daily.    . Insulin Regular Human  (RELION R IJ) Inject 4-16 Units as directed 3 (three) times daily. BS 120-200=4 units, 201-250=6 units, 251-300=8 units, 301-350=10 units, 351-400=14 units, 401-450=16 units.    Marland Kitchen levofloxacin (LEVAQUIN) 750 MG tablet Take 750 mg by mouth every other day.    . losartan (COZAAR) 25 MG tablet TAKE 1 TABLET BY MOUTH EVERY DAY 90 tablet 0  . magnesium oxide (MAG-OX) 400 (241.3 MG) MG tablet Take 1 tablet (400 mg total) by mouth daily. 30 tablet 11  . morphine (MS CONTIN) 60 MG 12 hr tablet Take 1 tablet (60 mg total) by mouth every 8 (eight) hours. 90 tablet 0  . Multiple Vitamin (MULTIVITAMIN) tablet Take 1 tablet by mouth daily.    . nitroGLYCERIN (NITROSTAT) 0.4 MG SL tablet Place 1 tablet (0.4 mg total) under the tongue every 5 (five) minutes as needed for chest pain. 25 tablet 3  . omeprazole (PRILOSEC) 20 MG capsule Take 20 mg by mouth daily.     . ondansetron (ZOFRAN) 8 MG tablet Take 8 mg by mouth every 8 (eight) hours as needed for nausea or vomiting.    Marland Kitchen oxyCODONE (OXY IR/ROXICODONE) 5 MG immediate release tablet Take 1 tablet (5 mg total) by mouth 2 (two) times daily as needed for moderate pain. 60 tablet 0  . polyethylene glycol (MIRALAX / GLYCOLAX) packet Take 17 g by mouth 2 (two) times daily. 14 each 0  . polyvinyl alcohol (LIQUIFILM TEARS) 1.4 % ophthalmic solution Place 1 drop into both eyes daily as needed for dry eyes.    . pravastatin (PRAVACHOL) 80 MG tablet Take 1 tablet (80 mg total) by mouth every evening. 90 tablet 3  . predniSONE (DELTASONE) 10 MG tablet Take 10 mg by mouth daily.     Marland Kitchen senna (SENOKOT) 8.6 MG TABS tablet Take 1 tablet (8.6 mg total) by mouth 2 (two) times daily. 120 each 0    Allergies: Allergies as of 02/07/2016 - Review Complete 02/07/2016  Allergen Reaction Noted  . Cyclosporine  06/18/2014  . Azathioprine  03/03/2014  . Mycophenolate Other (See Comments) 06/23/2014  . Other  03/03/2014  . Oysters [shellfish allergy] Nausea And Vomiting 10/19/2014    Past Medical History  Diagnosis Date  . Hypertension   . Arthritis   . Diabetes mellitus   . Coronary artery disease     a.  MI 01/2003;  b.  02/2006 NSTEMI - LAD 80-70m, LCX nl, RCA 65m - RCA stented w/ Taxus DES;   c.  2009 Neg MV;  d. 11/2010 Echo - EF 65%;  e. 02/2013 MV: Carlton Mahlon Gabrielle Cardiolite that was low risk with no ischemia.  Marland Kitchen  Autoimmune liver disease     a.  Followed @ [facility] - on chronic prednisone and  sirolimus  . Hyperlipidemia     a. inability to stake statins due to liver disease  . Cellulitis     a. chronic cellulitis of LE  . CKD (chronic kidney disease), stage IV ([place])   . Bulging disc   . Sciatica   . Chronic back pain     a.  On Methadone  . Skin cancer (melanoma) ([place])   . Esophageal stricture     narrowing  . Diabetic neuropathy ([place])   . Sepsis ([place])   . PAF (paroxysmal atrial fibrillation) ([place])     a. Tikosyn initiated 10/2014;  CHA2DS2VASc = 6 (eliquis).  . Chronic diastolic CHF (congestive heart failure) ([place])     a. 06/2014 TEE: EF 60-65%, Gr 2 DD, mild MR, mildly dil LA, Grade IV descending thoracic Ao plaque.  Marland Kitchen PAD (peripheral artery disease) ([place])   . Chronic acquired lymphedema     Family History: Significant for CAD, father and grandfather died of MI  Social History: Staying at rehab facility for the last month, previously resided at home, never smoker, does not drink.  Desires full code  Review of Systems: A complete ROS was negative except as per HPI.   Physical Exam: Blood pressure 84/44, pulse 63, temperature 98.5 F (36.9 C), temperature source Oral, resp. rate 11, height 5\' 1"  (1.549 m), weight 45.36 kg (100 lb), SpO2 100 %.   General appearance: Elderly female resting in stretcher, extensive bruising visible over extensor forearms and upper chest, conversational Cardiovascular: RRR, no audible murmurs, rubs, gallops Respiratory: Clear to auscultation bilaterally in upper lung fields, no increased work of breathing  Abdomen: Soft,  non-tender, non-distended Skin: Extensive dark bruising over forearms and red bruises over upper chest, warm, dry Neuro: CNs II-XII grossly intact, 4/5 strength in all extremities, alert and oriented Psych: Normal affect  EKG: Atrial fibrillation Left anterior fascicular block Probable anterolateral infarct, old Borderline T abnormalities, inferior leads Prolonged QT interval When compared with ECG of 01/26/2016, Atrial fibrillation has replaced Sinus rhythm HEART RATE has decreased QT has lengthened   Assessment & Plan by Problem:  1. Hypotension, likely secondary to bradycardia and hypovolemia, possible CHF exacerbation or sepsis type process, hypovolemia likely due to diarrhea, hypovolemia also may have impaired her kidney function and impaired clearance of her cardiac medications, typical bp for her is 110s/60s,  - Fluids as needed to maintain MAP (250 cc boluses) or slow maintenance, concern over elevated BNP  - Hold antihypertensives, hold BB, tikosyn, laxatives  - ECHO  - Trend serial troponins   - Telemetry  2. Anemia of chronic disease, Hb 7.7 on admission  - 1 u pRBCs, recheck AM CBC  3. Diarrhea, 2-3x/day unclear etiology, recent course of antibiotics and hospitalization, also had laxative regimen increased at previous admission  - Check c. Diff toxin, enteric precautions  - Hold home mag-ox, miralax, senna  4. AKI, Cr 1.9 from 1.27, likely prerenal due to hypovolemia  - hold apixaban  - avoid NSAIDs  - Check on AM BMP  5. Hyperkalemia, 5.7 on admission, increased to 6.0  - early AM BMP, monitor for arrhythmia  - 1x dose kayexalate  - may require calcium, insulin/glucose, or diuretic  6. Autoimmune liver disease, chronic  - continue home prednisone  7. Chronic back pain  - reduce from home pain regimen as tolerated while  hypotensive  - tylenol 650 Q6  prn  FEN: IVF, normal diet  DVT ppx: lovenox  Dispo: Admit patient to Inpatient with expected length of  stay greater than 2 midnights.  Signed: Asencion Partridge, MD 02/07/2016, 6:55 PM  Pager: [phone]

## 2016-02-07 NOTE — Progress Notes (Signed)
Went to check on patient. She is doing fine except for some neck pain, vitals are better now, HR improved to 70's, BP most recent 109/46. hgb improved to 9.0 from 7.7. She is no acute distress.   BMET showed K+ 6.0, she just kayex 15g few mins ago and has not had a bowel movement yet.  Will follow up BMET in AM.  Continue monitoring vitals for now, may do small fluid boluses if BP goes down to keep MAP >60.

## 2016-02-07 NOTE — ED Notes (Signed)
Report given to sam rn

## 2016-02-07 NOTE — ED Notes (Signed)
Admitting MD at bedside.  Pressures trending down.  Pt to be placed in step-down.  Blood transfusion rate increased to 150.

## 2016-02-07 NOTE — ED Notes (Signed)
Family at bedside. 

## 2016-02-07 NOTE — ED Notes (Signed)
Report attempted. Nurse states sam will call back.

## 2016-02-08 ENCOUNTER — Encounter (HOSPITAL_COMMUNITY): Payer: Self-pay | Admitting: General Practice

## 2016-02-08 ENCOUNTER — Other Ambulatory Visit (HOSPITAL_COMMUNITY): Payer: Medicare Other

## 2016-02-08 ENCOUNTER — Observation Stay (HOSPITAL_BASED_OUTPATIENT_CLINIC_OR_DEPARTMENT_OTHER): Payer: Medicare Other

## 2016-02-08 DIAGNOSIS — R9431 Abnormal electrocardiogram [ECG] [EKG]: Secondary | ICD-10-CM

## 2016-02-08 DIAGNOSIS — R197 Diarrhea, unspecified: Secondary | ICD-10-CM

## 2016-02-08 DIAGNOSIS — I482 Chronic atrial fibrillation: Secondary | ICD-10-CM | POA: Diagnosis not present

## 2016-02-08 DIAGNOSIS — I959 Hypotension, unspecified: Secondary | ICD-10-CM | POA: Diagnosis not present

## 2016-02-08 DIAGNOSIS — Z79891 Long term (current) use of opiate analgesic: Secondary | ICD-10-CM

## 2016-02-08 DIAGNOSIS — R001 Bradycardia, unspecified: Secondary | ICD-10-CM

## 2016-02-08 DIAGNOSIS — G8929 Other chronic pain: Secondary | ICD-10-CM

## 2016-02-08 DIAGNOSIS — E43 Unspecified severe protein-calorie malnutrition: Secondary | ICD-10-CM

## 2016-02-08 DIAGNOSIS — R5381 Other malaise: Secondary | ICD-10-CM

## 2016-02-08 DIAGNOSIS — Z7901 Long term (current) use of anticoagulants: Secondary | ICD-10-CM

## 2016-02-08 DIAGNOSIS — L8992 Pressure ulcer of unspecified site, stage 2: Secondary | ICD-10-CM | POA: Diagnosis present

## 2016-02-08 DIAGNOSIS — N179 Acute kidney failure, unspecified: Secondary | ICD-10-CM

## 2016-02-08 DIAGNOSIS — N189 Chronic kidney disease, unspecified: Secondary | ICD-10-CM

## 2016-02-08 LAB — BASIC METABOLIC PANEL
ANION GAP: 9 (ref 5–15)
BUN: 32 mg/dL — ABNORMAL HIGH (ref 6–20)
CHLORIDE: 100 mmol/L — AB (ref 101–111)
CO2: 27 mmol/L (ref 22–32)
Calcium: 7.1 mg/dL — ABNORMAL LOW (ref 8.9–10.3)
Creatinine, Ser: 1.7 mg/dL — ABNORMAL HIGH (ref 0.44–1.00)
GFR calc Af Amer: 32 mL/min — ABNORMAL LOW (ref >60)
GFR, EST NON AFRICAN AMERICAN: 27 mL/min — AB (ref >60)
GLUCOSE: 108 mg/dL — AB (ref 65–99)
Potassium: 4.5 mmol/L (ref 3.5–5.1)
Sodium: 136 mmol/L (ref 135–145)

## 2016-02-08 LAB — TYPE AND SCREEN
ABO/RH(D): O POS
ANTIBODY SCREEN: NEGATIVE
UNIT DIVISION: 0

## 2016-02-08 LAB — ECHOCARDIOGRAM COMPLETE
E decel time: 201 msec
EERAT: 16.49
FS: 36 % (ref 28–44)
HEIGHTINCHES: 61 in
IVS/LV PW RATIO, ED: 0.83
LA ID, A-P, ES: 42 mm
LA diam index: 2.86 cm/m2
LA vol A4C: 76 ml
LA vol index: 41 mL/m2
LA vol: 60.2 mL
LEFT ATRIUM END SYS DIAM: 42 mm
LV PW d: 10 mm — AB (ref 0.6–1.1)
LVEEAVG: 16.49
LVEEMED: 16.49
LVELAT: 7.4 cm/s
LVOT area: 2.84 cm2
LVOT diameter: 19 mm
MV Dec: 201
MVPG: 6 mmHg
MVPKAVEL: 80.5 m/s
MVPKEVEL: 122 m/s
RV TAPSE: 16.7 mm
Reg peak vel: 336 cm/s
TDI e' lateral: 7.4
TDI e' medial: 5.98
TR max vel: 336 cm/s
WEIGHTICAEL: 1763.2 [oz_av]

## 2016-02-08 LAB — GLUCOSE, CAPILLARY
GLUCOSE-CAPILLARY: 105 mg/dL — AB (ref 65–99)
GLUCOSE-CAPILLARY: 133 mg/dL — AB (ref 65–99)
Glucose-Capillary: 113 mg/dL — ABNORMAL HIGH (ref 65–99)
Glucose-Capillary: 305 mg/dL — ABNORMAL HIGH (ref 65–99)

## 2016-02-08 LAB — CBC
HCT: 27.5 % — ABNORMAL LOW (ref 36.0–46.0)
HEMOGLOBIN: 8.8 g/dL — AB (ref 12.0–15.0)
MCH: 27.6 pg (ref 26.0–34.0)
MCHC: 32 g/dL (ref 30.0–36.0)
MCV: 86.2 fL (ref 78.0–100.0)
Platelets: 125 10*3/uL — ABNORMAL LOW (ref 150–400)
RBC: 3.19 MIL/uL — ABNORMAL LOW (ref 3.87–5.11)
RDW: 19.2 % — ABNORMAL HIGH (ref 11.5–15.5)
WBC: 3.8 10*3/uL — AB (ref 4.0–10.5)

## 2016-02-08 LAB — C DIFFICILE QUICK SCREEN W PCR REFLEX
C DIFFICILE (CDIFF) TOXIN: NEGATIVE
C Diff antigen: NEGATIVE
C Diff interpretation: NEGATIVE

## 2016-02-08 LAB — TROPONIN I: TROPONIN I: 0.03 ng/mL — AB (ref <0.03)

## 2016-02-08 MED ORDER — GUAIFENESIN ER 600 MG PO TB12: 600.0000 mg | ORAL_TABLET | Freq: Two times a day (BID) | ORAL | Status: DC | PRN

## 2016-02-08 MED ORDER — BISOPROLOL FUMARATE 5 MG PO TABS
2.5000 mg | ORAL_TABLET | Freq: Every day | ORAL | Status: DC
  Administered 2016-02-08: 2.5 mg via ORAL
  Filled 2016-02-08: qty 1

## 2016-02-08 MED ORDER — COLLAGENASE 250 UNIT/GM EX OINT
TOPICAL_OINTMENT | Freq: Every day | CUTANEOUS | Status: DC
  Administered 2016-02-08: 12:00:00 via TOPICAL
  Filled 2016-02-08: qty 30

## 2016-02-08 MED ORDER — CALCIUM CARBONATE ANTACID 500 MG PO CHEW
400.0000 mg | CHEWABLE_TABLET | Freq: Three times a day (TID) | ORAL | Status: DC | PRN
  Administered 2016-02-08: 400 mg via ORAL
  Filled 2016-02-08: qty 2

## 2016-02-08 MED ORDER — MORPHINE SULFATE ER 15 MG PO TBCR
60.0000 mg | EXTENDED_RELEASE_TABLET | Freq: Three times a day (TID) | ORAL | Status: DC
  Administered 2016-02-08 – 2016-02-09 (×4): 60 mg via ORAL
  Filled 2016-02-08 (×4): qty 4

## 2016-02-08 MED ORDER — APIXABAN 2.5 MG PO TABS
2.5000 mg | ORAL_TABLET | Freq: Two times a day (BID) | ORAL | Status: DC
  Administered 2016-02-08 – 2016-02-09 (×3): 2.5 mg via ORAL
  Filled 2016-02-08 (×3): qty 1

## 2016-02-08 MED ORDER — DIPHENHYDRAMINE HCL 25 MG PO CAPS
25.0000 mg | ORAL_CAPSULE | Freq: Once | ORAL | Status: AC
  Administered 2016-02-08: 25 mg via ORAL
  Filled 2016-02-08: qty 1

## 2016-02-08 NOTE — Progress Notes (Signed)
Subjective: Cynthia Hicks is feeling better today and feels that her energy level has improved. She does endorse 3 loose stools overnight, which is an ongoing concern for her. She denies any new chest pain, shortness of breath. Her only source of pain is her heel ulcers, but this improved after nursing wrapped them. She has little appetite this morning but this has been an ongoing issue for her. Her bp improved to 123XX123 systolic over 123456 diastolic following her blood transfusion overnight, which improved her Hb from 7.7 to 8.8 this AM.   Objective: Vital signs in last 24 hours: Filed Vitals:   02/07/16 2119 02/08/16 0006 02/08/16 0438 02/08/16 0754  BP: 109/46 111/78 110/47 112/55  Pulse: 59 73 70 71  Temp: 97.8 F (36.6 C) 98.4 F (36.9 C) 98.2 F (36.8 C) 97.7 F (36.5 C)  TempSrc: Oral Oral Oral Oral  Resp: 18 20 17 13   Height: 5\' 1"  (1.549 m)     Weight: 49.986 kg (110 lb 3.2 oz)  49.986 kg (110 lb 3.2 oz)   SpO2: 100% 98% 94% 93%   Physical Exam  General appearance: Elderly tired-appearing woman resting comfortably in bed, conversational, soft-spoken Cardiovascular: RRR, no audible murmurs, rubs, gallops Respiratory: Largely clear to auscultation bilaterally, mild crackles at bilateral bases on inspiration, normal work of breathing Abdomen: Soft, non-tender, non-distended Skin: Extensive dark bruising over forearms and red bruises over upper chest, mottled erythematous dry skin and bruising of shins/feet, skin is warm and dry Ext: 1+ pitting edema to her knees in BLE, surgical scars over both knees, which are nontender  Neuro: CNs II-XII grossly intact, 4/5 strength in all extremities, alert and oriented Psych: Normal affect  Labs / Imaging / Procedures:  CBC Latest Ref Rng 02/08/2016 02/07/2016 02/07/2016  WBC 4.0 - 10.5 K/uL 3.8(L) 3.7(L) 4.5  Hemoglobin 12.0 - 15.0 g/dL 8.8(L) 9.0(L) 7.7(L)  Hematocrit 36.0 - 46.0 % 27.5(L) 28.5(L) 24.2(L)  Platelets 150 - 400 K/uL 125(L)  131(L) 139(L)    BMP Latest Ref Rng 02/08/2016 02/07/2016 02/07/2016  Glucose 65 - 99 mg/dL 108(H) 124(H) 150(H)  BUN 6 - 20 mg/dL 32(H) 31(H) 32(H)  Creatinine 0.44 - 1.00 mg/dL 1.70(H) 1.85(H) 1.93(H)  Sodium 135 - 145 mmol/L 136 132(L) 131(L)  Potassium 3.5 - 5.1 mmol/L 4.5 6.0(H) 5.7(H)  Chloride 101 - 111 mmol/L 100(L) 98(L) 98(L)  CO2 22 - 32 mmol/L 27 28 23   Calcium 8.9 - 10.3 mg/dL 7.1(L) 7.2(L) 7.1(L)   Troponins negative  C diff quick scan negative  Assessment/Plan: Cynthia Hicks is an 82 year old woman with an extensive PMH detailed above but most significant for new diarrhea, anemia of chronic disease, CAD s/p MIs/PCI, HFpEF , Afib on apixaban who presented with profound bradycardia and hypotension.    1. Hypotension, likely secondary to bradycardia and hypovolemia, resolved, possible CHF exacerbation or sepsis type process, hypovolemia likely due to diarrhea, hypovolemia also may have impaired her kidney function and impaired clearance of her cardiac medications, typical bp for her is 110s/60s, - Hold antihypertensives, hold BB, tikosyn, laxatives - ECHO today - Telemetry  2. Anemia of chronic disease, Hb 8.8 from 7.7 on admission - Check erythropoietin level given hx of CKD   - Daily CBC  3. Diarrhea, ongoing, 2-3x/day unclear etiology, recent course of antibiotics and hospitalization, also had laxative regimen increased at previous admission - C diff negative - Hold home mag-ox, miralax, senna  4. AKI, Cr 1.7 from 1.9, resolving likely prerenal due to  hypovolemia - hold apixaban for now, restart when Cr at baseline (~1.2-3), renally-dose when restarting - avoid NSAIDs - Daily BMP  5. Hyperkalemia, 4.5 from 6 overnight, improving - Daily BMP - Dose kayexalate as needed  6. Autoimmune liver disease, chronic - continue  home prednisone  7. Chronic back pain - Restart home MS Contin 60mg  TID sch  - PT/OT - tylenol 650 Q6 prn  8. Malnutrition/deconditioning, secondary to acute illness, weakness from anemia, immobility due to bilateral septic knee arthritis, Albumin 2.2 this admission  - Follow nutrition consult recs  FEN: IVF, normal diet  DVT ppx: lovenox  Dispo: Anticipated discharge in approximately 1-2 day(s).   LOS: 1 day   Asencion Partridge, MD 02/08/2016, 9:59 AM Pager: [phone]

## 2016-02-08 NOTE — Progress Notes (Signed)
Date: 02/08/2016  Patient name: Cynthia Hicks  Medical record number: [id number]  Date of birth: 05/08/1933   I have seen and evaluated [name] and discussed their care with the Residency Team. Ms Rosalyn Charters was transferred from her SNF to the ED for vomiting, hypotension, and bradycardia. Ms Glenny has been at the SNF since her D/C on 6/23 for generalized fatigue and malaise thought to be 2/2 acute on chronic anemia of chronic dz. She received 1 unit PRBC and was transferred back to her SNF after one night. She had been hospitalized in June and D/C'd on 6/6 for Pseudomonal septic arthritis of her knees B. Her last levaquin dose was the 27th. Prior to these hospitalizations, she was able to ambulate with a walker and lived with her sister in law and only needed assistance washing her back. Since these admissions, she has been non ambulatory.   Fatigue has been normal for her since she went to the SNF. However, on the day of admit, it was more severe and she couldn't keep her eyes open. Her vitals were checked and she was hypotension and bradycardic. She has been having loose stools 3 times daily since she was transferred to SNF.  She has chronic A Fib and is on eliquis and tikosyn. She was first noticed to be in A Fib in 12/13 annd warfarin was started but was converted to Eliquis. In 3/14, she was cardioverted.She went back into atrial fibrillation in 11/15 but was back in NSR when she showed up for DCCV. She was back in atrial fibrillation at her 2/16 appt. In 3/16, she was admitted for Tikosyn and converted back to NSR.Tikosyn was felt to be necessary as her HF sxs are exacerbated when she is in A Fib even though she does not feel palpitations. She was in sinus on outpt F/U. On admit yesterday, she was bradycardic but in A Fib. Currently, back in sinus. She has misses 2 doses of tikosyn so far.  PMHx, Fam Hx, and/or Soc Hx : per HPI and Dr Durenda Age note  Filed Vitals:   02/08/16 0754 02/08/16  1114  BP: 112/55   Pulse: 71   Temp: 97.7 F (36.5 C) 98.1 F (36.7 C)  Resp: 13    Chronically ill appearing Senile purpura HRRR No MRG LCTAB ABD + BS, soft Ext + 1 edema B, pinkness to RLE  Labs from 7/5 Cr 1.7 (baseline about 1.2) GFR 27 Alb 2.2 Trop negative x3 WBC 3.8 HgB 8.8 PLTS 125 C diff negative  I personally viewed her EKG and confirmed by reading with the official read. Sinus, nl axis, no ischemic changes. QTc 497  Assessment and Plan: I have seen and evaluated the patient as outlined above. I agree with the formulated Assessment and Plan as detailed in the residents' note, with the following changes:   Dr Durenda Age timeline is that her D along with low PO intake lead to vol contraction and pre-renal acute on chronic kidney failure. Since both tikosyn and her bisoprolol are excreted renally, this lead to elevated serum leading to bradycardia and hypotension along with her sxs of fatigue.   1. Hypotension, bradycardia - tikosyn and bisoprolol are being held. Her HR and BP are now at baseline. Team collaborated with pharmacy regarding her Josephine. Since she has missed 2 doses, she would need to restart while inpt and remain inpt for 3 days for monitoring. Also her QTc is 497 this AM, so tikosyn is contraindicated. Therefore, will not  resume tikosyn and will inform cards since they started it since her HF is worse when she is in A Fib.   2. A Fib - Her dose of eliquis will depend on her renal fxn recovery. Will resume tonight. Will restart her bisoprolol tonight at 2.5 and if tolerates increase to home dose of 5 in AM. Stop tikosyn.  3. Acute on chronic renal failure - most likely pre-renal. She received IVF. Cr is not yet at baseline but is trending down.  4. Diarrhea - She is C diff negative which was the primary concern as she is immunosuppressed, on ABX, and recently in hospital. Therefore, the etiology is likely cont admin of Mag-ox, miralax, and senna. Will stop  those and follow.  5. Global deconditioning - PT / OT consult. She has been non ambulatory for weeks so she has a difficult course ahead of her eto regain her independence.   6. Severe protein calorie malnutrition - encourage PO intake. She does not like supplemental shakes.  7. Chronic pain - she had been on methadone starting about 5 yrs ago but changed to MS Contin with oxycodone break through when changed to Tikosyn. Since she is on chronic opioids, we will continue although her pain now controlled but will need to work with PT / OT.   Bartholomew Crews, MD 7/5/20171:05 PM

## 2016-02-08 NOTE — Clinical Social Work Note (Signed)
Clinical Social Work Assessment  Patient Details  Name: Cynthia Hicks MRN: [id number] Date of Birth: 1933/04/24  Date of referral:  02/08/16               Reason for consult:  Facility Placement, Discharge Planning                Permission sought to share information with:  Facility Sport and exercise psychologist, Family Supports Permission granted to share information::  Yes, Verbal Permission Granted  Name::     Chief Operating Officer::  [place]  Relationship::  Son  Sport and exercise psychologist Information:  [phone]  Housing/Transportation Living arrangements for the past 2 months:  [place] of Information:  Patient, Medical Team Patient Interpreter Needed:  None Criminal Activity/Legal Involvement Pertinent to Current Situation/Hospitalization:  No - Comment as needed Significant Relationships:  Adult Children Lives with:  Facility Resident Do you feel safe going back to the place where you live?  Yes Need for family participation in patient care:  Yes (Comment)  Care giving concerns:  Patient is from [facility].   Social Worker assessment / plan:  CSW met with patient. No supports at bedside. CSW introduced role and explained that discharge planning would be discussed. CSW confirmed that patient is from [place] and plans to return once discharged in reportedly 1-2 days. Patient has Medicare and is under observation but has had more than one other hospital admission within the past 30 days. PT has not seen yet but will need to prior to discharge. Patient's son will transport. No further concerns. CSW encouraged patient to contact CSW as needed. CSW will continue to follow patient and facilitate discharge back to SNF once medically stable for discharge.  Employment status:  Retired Forensic scientist:  Medicare PT Recommendations:  Not assessed at this time Information / Referral to community resources:   (None. Plans to return to [facility]  Patient/Family's Response to  care:  Patient agreeable to return to SNF. Patient's sons are supportive and involved in patient's care. Patient appreciated social work intervention.  Patient/Family's Understanding of and Emotional Response to Diagnosis, Current Treatment, and Prognosis:  Patient understands need for return to SNF. Patient appears happy with care at hospital.  Emotional Assessment Appearance:  Appears stated age Attitude/Demeanor/Rapport:   (Pleasant) Affect (typically observed):  Accepting, Appropriate, Calm, Pleasant Orientation:  Oriented to Self, Oriented to Place, Oriented to  Time, Oriented to Situation Alcohol / Substance use:  Never Used Psych involvement (Current and /or in the community):  No (Comment)  Discharge Needs  Concerns to be addressed:  Care Coordination Readmission within the last 30 days:  Yes Current discharge risk:  Dependent with Mobility Barriers to Discharge:  No Barriers Identified   Candie Chroman, LCSW 02/08/2016, 12:25 PM

## 2016-02-08 NOTE — Progress Notes (Signed)
Initial Nutrition Assessment  DOCUMENTATION CODES:   Severe malnutrition in context of chronic illness  INTERVENTION:    Mighty Shake II TID with meals, each supplement provides 480-500 kcals and 20-23 grams of protein  NUTRITION DIAGNOSIS:   Malnutrition related to chronic illness as evidenced by severe depletion of muscle mass, severe depletion of body fat.  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 She presented from [facility] SNF via EMS for bp 70/24 and HR 34, EMS was initially called for vomiting and hypotension of bp in the 80s   Nutrition-Focused physical exam completed. Findings are severe fat depletion, severe muscle depletion, and moderate edema.  Patient with severe PCM. She thinks she has lost weight. Suspect edema is masking true amount of weight loss. Per review of usual weights, she has lost 16% of her usual weight in the past year. She reports that she has been eating poorly for the past 3-4 weeks. She does not like Boost or Ensure Supplements, but agreed to try chocolate Mighty Shake II with meals.   Diet Order:  Diet Heart Room service appropriate?: Yes; Fluid consistency:: Thin  Skin:  Wound (see comment) (stage 2 to sacrum/buttocks; unstageable to bil heels)  Last BM:  7/4  Height:   Ht Readings from Last 1 Encounters:  02/07/16 5\' 1"  (1.549 m)    Weight:   Wt Readings from Last 1 Encounters:  02/08/16 110 lb 3.2 oz (49.986 kg)    Ideal Body Weight:  47.7 kg  BMI:  Body mass  index is 20.83 kg/(m^2).  Estimated Nutritional Needs:   Kcal:  1400-1600  Protein:  70-80 gm  Fluid:  1.5 L  EDUCATION NEEDS:   No education needs identified at this time  Molli Barrows, [place], [place], [place] Pager [phone] After Hours Pager [phone]

## 2016-02-08 NOTE — NC FL2 (Signed)
[place] LEVEL OF CARE SCREENING TOOL     IDENTIFICATION  Patient Name: Cynthia Hicks Birthdate: 1932-08-31 Sex: female Admission Date (Current Location): 02/07/2016  [facility] and [place] Number:  Herbalist and Address:  [facility], [place] [address] [place], [place] [address]      Provider Number: [id number]  Attending Physician Name and Address:  Bartholomew Crews, MD  Relative Name and Phone Number:       Current Level of Care: Hospital Recommended Level of Care: [place] Prior Approval Number:    Date Approved/Denied:   PASRR Number: [id number]  Discharge Plan: SNF    Current Diagnoses: Patient Active Problem List   Diagnosis Date Noted  . Pressure ulcer stage II 02/08/2016  . Bradycardia 02/07/2016  . Absolute anemia   . Anemia 01/26/2016  . Chest pain 01/19/2016  . PAF (paroxysmal atrial fibrillation) ([place])   . Hyperlipidemia   . PAD (peripheral artery disease) ([place])   . Chronic acquired lymphedema   . Septic arthritis of knee ([place]) 01/09/2016  . Malnutrition of moderate degree 01/08/2016  . Pressure ulcer 01/08/2016  . Knee pain, bilateral 01/06/2016  . PICC (peripherally inserted central catheter) in place   . Sepsis ([place]) 06/08/2015  . Sepsis due to methicillin resistant Staphylococcus aureus ([place]) 06/08/2015  . Visit for monitoring Tikosyn therapy   . CKD (chronic kidney disease), stage III 06/24/2014  . CAP (community acquired pneumonia)   . Acute on chronic diastolic CHF (congestive heart failure) ([place])   . Cellulitis of right lower extremity   . Diabetes mellitus type 2, uncontrolled ([place])   . Polyneuropathy in diabetes(357.2) 03/04/2014  . Localized, primary osteoarthritis of shoulder region 11/02/2013  . Chronic diastolic CHF (congestive heart failure) ([place]) 05/02/2013  . Biliary calculi, common bile duct 02/28/2013  . Fungal infection of nail 02/28/2013  . H/O  immunosuppressive therapy 02/28/2013  . Long term (current) use of anticoagulants 10/31/2012  . Swelling of limb 10/22/2012  . Pain in limb 10/22/2012  . Lymphedema 10/22/2012  . Cellulitis 07/10/2012  . Arteriosclerosis of coronary artery 04/04/2012  . Encounter for long-term (current) use of steroids 04/04/2012  . Diabetes mellitus, type 2 ([place]) 04/04/2012  . HLD (hyperlipidemia) 04/04/2012  . Disease of liver 04/04/2012  . Heart attack ([place]) 04/04/2012  . Long term current use of systemic steroids 04/04/2012  . Abnormal abdominal MRI 03/03/2012  . Abdominal pain 12/04/2011  . Systolic CHF, chronic ([place]) 09/15/2011  . NSTEMI (non-ST elevated myocardial infarction) ([place]) 08/30/2011  . Systolic CHF, acute ([place]) 08/30/2011  . Coronary artery disease   . Chronic back pain   . Chronic kidney disease   . Autoimmune liver disease   . Carotid bruit 07/02/2011  . Hypoxemia 11/17/2010  . Palpitations 11/06/2010  . OTHER NONINFECTIOUS DISORDERS LYMPHATIC CHANNELS 07/21/2009  . Diabetes mellitus ([place]) 07/07/2009  . Anemia of chronic disease 07/07/2009  . HTN (hypertension) 07/07/2009  . GERD 07/07/2009  . Autoimmune hepatitis ([place]) 07/07/2009  . DEGENERATIVE DISC DISEASE 07/07/2009  . PSOAS MUSCLE ABSCESS 07/05/2009  . HYPERLIPIDEMIA-MIXED 01/02/2009  . VENOUS INSUFFICIENCY, LEGS 01/02/2009    Orientation RESPIRATION BLADDER Height & Weight     Self, Time, Situation, Place  Normal Incontinent Weight: 110 lb 3.2 oz (49.986 kg) Height:  5\' 1"  (154.9 cm)  BEHAVIORAL SYMPTOMS/MOOD NEUROLOGICAL BOWEL NUTRITION STATUS   (None)  (None) Incontinent    AMBULATORY STATUS COMMUNICATION OF NEEDS Skin   Limited  Assist Verbally PU Stage and Appropriate Care (Unstageable left and right heel: Foam. Unstageable lateral left foot.)   PU Stage 2 Dressing:  (Mid buttocks: Foam; Duoderm. Sacrum foam prn.)                   Personal Care Assistance Level of Assistance  Bathing, Feeding,  Dressing Bathing Assistance: Limited assistance Feeding assistance: Independent Dressing Assistance: Limited assistance     Functional Limitations Info  Sight, Hearing, Speech Sight Info: Adequate Hearing Info: Adequate Speech Info: Adequate    SPECIAL CARE FACTORS FREQUENCY  Blood pressure, Diabetic urine testing, PT (By licensed PT), OT (By licensed OT)     PT Frequency: 5 x week OT Frequency: 5 x week            Contractures Contractures Info: Not present    Additional Factors Info  Code Status, Allergies Code Status Info: Full Allergies Info: Cyclosproine, Azathioprine, Mycophenolate, Other, Oysters           Current Medications (02/08/2016):  This is the current hospital active medication list Current Facility-Administered Medications  Medication Dose Route Frequency Provider Last Rate Last Dose  . acetaminophen (TYLENOL) tablet 650 mg  650 mg Oral Q6H PRN Milagros Loll, MD   650 mg at 02/08/16 1446   Or  . acetaminophen (TYLENOL) suppository 650 mg  650 mg Rectal Q6H PRN Milagros Loll, MD      . apixaban Arne Cleveland) tablet 2.5 mg  2.5 mg Oral BID Asencion Partridge, MD   2.5 mg at 02/08/16 1456  . bisoprolol (ZEBETA) tablet 2.5 mg  2.5 mg Oral Daily Asencion Partridge, MD   2.5 mg at 02/08/16 1456  . calcium carbonate (TUMS - dosed in mg elemental calcium) chewable tablet 400 mg of elemental calcium  400 mg of elemental calcium Oral TID WC PRN Asencion Partridge, MD   400 mg of elemental calcium at 02/08/16 1446  . cholecalciferol (VITAMIN D) tablet 2,000 Units  2,000 Units Oral QHS Milagros Loll, MD   2,000 Units at 02/07/16 2223  . clotrimazole (MYCELEX) troche 10 mg  10 mg Oral 5 X Daily Milagros Loll, MD   10 mg at 02/07/16 2224  . collagenase (SANTYL) ointment   Topical Daily Bartholomew Crews, MD      . guaiFENesin [facility]) 12 hr tablet 600 mg  600 mg Oral BID PRN Asencion Partridge, MD      . insulin aspart (novoLOG) injection 0-5 Units  0-5 Units Subcutaneous QHS  Milagros Loll, MD   0 Units at 02/07/16 2200  . insulin aspart (novoLOG) injection 0-9 Units  0-9 Units Subcutaneous TID WC Milagros Loll, MD   0 Units at 02/08/16 0800  . morphine (MS CONTIN) 12 hr tablet 60 mg  60 mg Oral Q8H Alexa R Burns, MD   60 mg at 02/08/16 1446  . multivitamin with minerals tablet 1 tablet  1 tablet Oral Daily Milagros Loll, MD   1 tablet at 02/08/16 [phone]  . pantoprazole (PROTONIX) EC tablet 40 mg  40 mg Oral Daily Milagros Loll, MD   40 mg at 02/08/16 [phone]  . polyvinyl alcohol (LIQUIFILM TEARS) 1.4 % ophthalmic solution 1 drop  1 drop Both Eyes Daily PRN Milagros Loll, MD      . pravastatin (PRAVACHOL) tablet 80 mg  80 mg Oral QPM Milagros Loll, MD   80 mg at 02/07/16 2223  . predniSONE (DELTASONE) tablet 10 mg  10 mg Oral Q breakfast Milagros Loll, MD   10 mg at 02/08/16 0834  . sodium chloride flush (NS) 0.9 % injection 3 mL  3 mL Intravenous Q12H Milagros Loll, MD   3 mL at 02/08/16 1226     Discharge Medications: Please see discharge summary for a list of discharge medications.  Relevant Imaging Results:  Relevant Lab Results:   Additional Information SS#: [id number]  Candie Chroman, LCSW

## 2016-02-08 NOTE — Progress Notes (Signed)
  Echocardiogram 2D Echocardiogram has been performed.  Donata Clay 02/08/2016, 1:17 PM

## 2016-02-08 NOTE — Progress Notes (Signed)
WOC wound consult note Reason for Consult:Pressure injury to sacrum Wound type:Unstageable pressure injuries to sacrum, bilateral heels and left 5th metatarsal Pressure Ulcer POA: Yes Measurement: Sacral wound 2.5 cm x 1 cm. Unable to determine depth due to slough(has a 2nd distal wound that is less than 1 cm from primary wound so both measured as one), right heel 1.5 cm x 0.8 cm, left heel 2.5 cm x 1.5 cm, left 5th metatarsal approximately 1 cm x 1 cm Wound [id number] wound with 100% slough, stable eschar to right and left heels and left 5th metatarsal  Drainage (amount, consistency, odor) none noted Periwound:Has resolved yeast infection to sacral area, right and left heels with dry flaky skin, left 5th metatarsal with thick callous Dressing procedure/placement/frequency: Cleanse sacral wound with normal saline, pat dry. Apply dime size amount of Santyl Ointment to wound bed and cover with dry dressing. Change dressing daily. Apply Allevyn border heel dressing to bilateral heels. Change every five days and prn if soiled. Apply Prevalon boots to both heels while in bed Wipe left 5th metatarsal with skin prep daily    Re consult if needed, will not follow at this time. Thanks, Melba Coon MSN, RN, [employer]

## 2016-02-08 NOTE — Evaluation (Signed)
Physical Therapy Evaluation Patient Details Name: Cynthia Hicks MRN: [id number] DOB: 1932/09/14 Today's Date: 02/08/2016   History of Present Illness  Patient is a 82 y/o female with hx of anemia, HTN, DM, CAD, autoimmune liver disease, cellulitis, CKD, diabetic neuropathy, PAF, chronic diastolic heart failure, left elbow surgery, bil knee arthroscopy for septic arthritis 01/08/16 presents from [facility] SNF with vomiting, hypotension, and bradycardia.  Clinical Impression  Patient presents with generalized weakness, deconditioning, fatigue, and balance deficits s/p above impacting mobility. Tolerated gait training with Min A for balance/safety. Pt with difficulty navigating RW. Appropriate to return to [facility] SNF to maximize independence and mobility prior to return home. Will follow.    Follow Up Recommendations SNF (return to [facility])    Equipment Recommendations  None recommended by PT    Recommendations for Other Services       Precautions / Restrictions Precautions Precautions: Fall Restrictions Weight Bearing Restrictions: No      Mobility  Bed Mobility               General bed mobility comments: UP in chair upon PT arrival.   Transfers Overall transfer level: Needs assistance Equipment used: Rolling walker (2 wheeled) Transfers: Sit to/from Stand Sit to Stand: Mod assist         General transfer comment: mod A to boost from low chair with cues for hand placement/technique.   Ambulation/Gait Ambulation/Gait assistance: Min assist Ambulation Distance (Feet): 75 Feet Assistive device: Rolling walker (2 wheeled) Gait Pattern/deviations: Step-through pattern;Decreased stride length;Drifts right/left Gait velocity: decreased   General Gait Details: Slow, unsteady gait with Min assist for RW management as pt having difficulty during turns and cues to stay within RW.  Stairs            Wheelchair Mobility    Modified Rankin (Stroke Patients  Only)       Balance Overall balance assessment: Needs assistance Sitting-balance support: Feet supported;No upper extremity supported Sitting balance-Leahy Scale: Good Sitting balance - Comments: Using UE support    Standing balance support: During functional activity Standing balance-Leahy Scale: Poor Standing balance comment: Reliant on BUEs for support.                             Pertinent Vitals/Pain Pain Assessment: Faces Faces Pain Scale: Hurts even more Pain Location: jaw and left shoulder Pain Descriptors / Indicators: Sore;Aching Pain Intervention(s): Monitored during session;Repositioned    Home Living Family/patient expects to be discharged to:: Skilled nursing facility     Type of Home: [place]: One level Home Equipment: Environmental consultant - 2 wheels      Prior Function     Gait / Transfers Assistance Needed: Uses RW for ambulation at SNF and has been working with PT. Walked 75' last week.  ADL's / Homemaking Assistance Needed: Assist with ADLs at [facility].        Hand Dominance   Dominant Hand: Right    Extremity/Trunk Assessment   Upper Extremity Assessment:  (bil restricted AROM to 90 degrees in shoulders)           Lower Extremity Assessment: Defer to PT evaluation;Generalized weakness;RLE deficits/detail;LLE deficits/detail RLE Deficits / Details: tingling in foot LLE Deficits / Details: tingling in foot  Cervical / Trunk Assessment: Kyphotic  Communication   Communication: No difficulties  Cognition Arousal/Alertness: Awake/alert Behavior During Therapy: WFL for tasks assessed/performed Overall Cognitive Status: Within  Functional Limits for tasks assessed       Memory: Decreased short-term memory              General Comments      Exercises General Exercises - Lower Extremity Ankle Circles/Pumps: Both;10 reps;Supine Long Arc Quad: Both;10 reps;Seated      Assessment/Plan     PT Assessment Patient needs continued PT services  PT Diagnosis Difficulty walking;Generalized weakness;Acute pain   PT Problem List Decreased strength;Pain;Decreased balance;Decreased activity tolerance;Decreased mobility;Decreased safety awareness;Impaired sensation;Decreased cognition;Decreased knowledge of use of DME;Decreased skin integrity  PT Treatment Interventions Balance training;Gait training;Functional mobility training;Therapeutic activities;Therapeutic exercise;Patient/family education;DME instruction   PT Goals (Current goals can be found in the Care Plan section) Acute Rehab PT Goals Patient Stated Goal: to get back to rehab and get stronger PT Goal Formulation: With patient Time For Goal Achievement: 02/22/16 Potential to Achieve Goals: Good    Frequency Min 2X/week   Barriers to discharge        Co-evaluation               End of Session Equipment Utilized During Treatment: Gait belt Activity Tolerance: Patient limited by fatigue Patient left: in chair;with call bell/phone within reach Nurse Communication: Mobility status    Functional Assessment Tool Used: clinical judgment Functional Limitation: Mobility: Walking and moving around Mobility: Walking and Moving Around Current Status [phone]): At least 40 percent but less than 60 percent impaired, limited or restricted Mobility: Walking and Moving Around Goal Status [phone]): At least 20 percent but less than 40 percent impaired, limited or restricted    Time: 1424-1445 PT Time Calculation (min) (ACUTE ONLY): 21 min   Charges:   PT Evaluation $PT Eval Moderate Complexity: 1 Procedure     PT G Codes:   PT G-Codes **NOT FOR INPATIENT CLASS** Functional Assessment Tool Used: clinical judgment Functional Limitation: Mobility: Walking and moving around Mobility: Walking and Moving Around Current Status [id number]): At least 40 percent but less than 60 percent impaired, limited or restricted Mobility: Walking  and Moving Around Goal Status [phone]): At least 20 percent but less than 40 percent impaired, limited or restricted    [place] 02/08/2016, 2:58 PM Cynthia Hicks, [place], DPT [phone]

## 2016-02-09 DIAGNOSIS — I959 Hypotension, unspecified: Secondary | ICD-10-CM | POA: Diagnosis not present

## 2016-02-09 DIAGNOSIS — R001 Bradycardia, unspecified: Secondary | ICD-10-CM | POA: Diagnosis not present

## 2016-02-09 LAB — CBC
HCT: 32 % — ABNORMAL LOW (ref 36.0–46.0)
Hemoglobin: 10.2 g/dL — ABNORMAL LOW (ref 12.0–15.0)
MCH: 27.8 pg (ref 26.0–34.0)
MCHC: 31.9 g/dL (ref 30.0–36.0)
MCV: 87.2 fL (ref 78.0–100.0)
PLATELETS: 138 10*3/uL — AB (ref 150–400)
RBC: 3.67 MIL/uL — AB (ref 3.87–5.11)
RDW: 19.3 % — ABNORMAL HIGH (ref 11.5–15.5)
WBC: 5.4 10*3/uL (ref 4.0–10.5)

## 2016-02-09 LAB — GLUCOSE, CAPILLARY
GLUCOSE-CAPILLARY: 75 mg/dL (ref 65–99)
GLUCOSE-CAPILLARY: 125 mg/dL — AB (ref 65–99)

## 2016-02-09 LAB — ERYTHROPOIETIN: ERYTHROPOIETIN: 123 m[IU]/mL — AB (ref 2.6–18.5)

## 2016-02-09 LAB — BASIC METABOLIC PANEL
ANION GAP: 7 (ref 5–15)
BUN: 29 mg/dL — ABNORMAL HIGH (ref 6–20)
CALCIUM: 7.6 mg/dL — AB (ref 8.9–10.3)
CO2: 28 mmol/L (ref 22–32)
Chloride: 104 mmol/L (ref 101–111)
Creatinine, Ser: 1.34 mg/dL — ABNORMAL HIGH (ref 0.44–1.00)
GFR, EST AFRICAN AMERICAN: 42 mL/min — AB (ref >60)
GFR, EST NON AFRICAN AMERICAN: 36 mL/min — AB (ref >60)
GLUCOSE: 81 mg/dL (ref 65–99)
POTASSIUM: 4.3 mmol/L (ref 3.5–5.1)
SODIUM: 139 mmol/L (ref 135–145)

## 2016-02-09 MED ORDER — BISOPROLOL FUMARATE 5 MG PO TABS
5.0000 mg | ORAL_TABLET | Freq: Every day | ORAL | Status: DC
  Administered 2016-02-09: 5 mg via ORAL
  Filled 2016-02-09: qty 1

## 2016-02-09 MED ORDER — POLYETHYLENE GLYCOL 3350 17 G PO PACK: 17.0000 g | PACK | Freq: Every day | ORAL | Status: AC | PRN

## 2016-02-09 MED ORDER — COLLAGENASE 250 UNIT/GM EX OINT: TOPICAL_OINTMENT | Freq: Every day | CUTANEOUS | Status: AC

## 2016-02-09 MED ORDER — SENNA 8.6 MG PO TABS: 1.0000 | ORAL_TABLET | Freq: Every evening | ORAL | Status: AC | PRN

## 2016-02-09 NOTE — Progress Notes (Signed)
  Date: 02/09/2016  Patient name: Cynthia Hicks  Medical record number: [id number]  Date of birth: [birth year]   This patient has been seen and the plan of care was discussed with the house staff. Please see their note for complete details. I concur with their findings with the following additions/corrections: [name] walked 75 feet with PT yesterday. She was surprised she had that much energy. Her BB was started a half dose last PM and she tolerated it and will be increased to home dose today. Her DOAC has also been restarted. Her Tikosyn will cont to be held - QTc prolonged and missed >2 doses. Will notify cards it is being held. Loose stools improving with holding of bowel regimen. Stable for D/C SNF today.  Bartholomew Crews, MD 02/09/2016, 11:09 AM

## 2016-02-09 NOTE — Progress Notes (Signed)
Occupational Therapy Evaluation Patient Details Name: Cynthia Hicks MRN: [id number] DOB: 1932/10/02 Today's Date: 02/09/2016    History of Present Illness Patient is a 82 y/o female with hx of anemia, HTN, DM, CAD, autoimmune liver disease, cellulitis, CKD, diabetic neuropathy, PAF, chronic diastolic heart failure, left elbow surgery, bil knee arthroscopy for septic arthritis 01/08/16 presents from [facility] SNF with vomiting, hypotension, and bradycardia.   Clinical Impression   PTA, pt required assistance for all ADLs and primarily used w/c for mobility while at SNF for past 4 weeks. Pt currently presents with generalized weakness, fatigue and deconditioning and required mod assist for LB ADLs and min assist for basic transfers and ambulation due to balance impairments. Began education on energy conservation and pt required x2 rest breaks while completing ADLs at sink. Pt plans to return to SNF for rehab prior to returning home - agree this is most appropriate discharge disposition. Will continue to follow acutely.    Follow Up Recommendations  SNF;Supervision/Assistance - 24 hour    Equipment Recommendations  Other (comment) (TBD at next venue)    Recommendations for Other Services       Precautions / Restrictions Precautions Precautions: Fall Restrictions Weight Bearing Restrictions: No      Mobility Bed Mobility               General bed mobility comments: Pt up in chair on OT arrival  Transfers Overall transfer level: Needs assistance Equipment used: Rolling walker (2 wheeled) Transfers: Sit to/from Stand Sit to Stand: Min assist         General transfer comment: Assist for boost to stand and to stabilize balance upon standing. VCs for safe hand placement and education on energy conservation/frequent rest breaks.    Balance Overall balance assessment: Needs assistance Sitting-balance support: No upper extremity supported;Feet supported Sitting  balance-Leahy Scale: Fair Sitting balance - Comments: Requires UE support for weight-shifting   Standing balance support: Bilateral upper extremity supported;During functional activity Standing balance-Leahy Scale: Poor Standing balance comment: Reliant on UE support to maintain balance at all times                            ADL Overall ADL's : Needs assistance/impaired Eating/Feeding: Set up;Sitting   Grooming: Wash/dry hands;Wash/dry face;Oral care;Minimal assistance;Standing;Sitting Grooming Details (indicate cue type and reason): 2 rest breaks required Upper Body Bathing: Min guard;Sitting   Lower Body Bathing: Moderate assistance;Sit to/from stand   Upper Body Dressing : Min guard;Sitting   Lower Body Dressing: Moderate assistance;Sit to/from stand   Toilet Transfer: Minimal assistance;Ambulation;BSC;RW;Cueing for safety   Toileting- Clothing Manipulation and Hygiene: Minimal assistance;Sit to/from stand       Functional mobility during ADLs: Minimal assistance;Rolling walker General ADL Comments: assist primarily for balance and sit-stand. Pt fatigues quickly and required 2 rest breaks while completing grooming tasks at sink.     Vision Vision Assessment?: No apparent visual deficits   Perception     Praxis      Pertinent Vitals/Pain Pain Assessment: No/denies pain     Hand Dominance Right   Extremity/Trunk Assessment Upper Extremity Assessment Upper Extremity Assessment: Generalized weakness (overall 2/5; bilateral shoulder flexion/abduction limited 90)   Lower Extremity Assessment Lower Extremity Assessment: Defer to PT evaluation RLE Sensation: history of peripheral neuropathy;decreased light touch LLE Sensation: history of peripheral neuropathy;decreased light touch   Cervical / Trunk Assessment Cervical / Trunk Assessment: Kyphotic (severely)   Communication Communication Communication: No difficulties  Cognition Arousal/Alertness:  Awake/alert Behavior During Therapy: WFL for tasks assessed/performed Overall Cognitive Status: Within Functional Limits for tasks assessed       Memory: Decreased short-term memory             General Comments       Exercises       Shoulder Instructions      Home Living Family/patient expects to be discharged to:: Skilled nursing facility [facility])                             Home Equipment: Gilford Rile - 4 wheels          Prior Functioning/Environment Level of Independence: Needs assistance  Gait / Transfers Assistance Needed: Primarily uses w/c at SNF, occasional walking with RW when working with PT (75' at most). Prior to SNF stay, pt using 4ww independently. ADL's / Homemaking Assistance Needed: Assist with all ADLs at SNF. Pt reports she has been too weak to complete by herself.        OT Diagnosis: Generalized weakness   OT Problem List: Decreased strength;Decreased range of motion;Decreased activity tolerance;Impaired balance (sitting and/or standing);Decreased coordination;Decreased safety awareness;Decreased knowledge of use of DME or AE;Cardiopulmonary status limiting activity;Impaired sensation   OT Treatment/Interventions: Self-care/ADL training;Therapeutic exercise;Energy conservation;DME and/or AE instruction;Therapeutic activities;Patient/family education;Balance training    OT Goals(Current goals can be found in the care plan section) Acute Rehab OT Goals Patient Stated Goal: to get back to rehab and get stronger OT Goal Formulation: With patient Time For Goal Achievement: 02/23/16 Potential to Achieve Goals: Good ADL Goals Pt Will Perform Grooming: with supervision;standing Pt Will Perform Upper Body Bathing: with supervision;sitting Pt Will Perform Lower Body Bathing: with supervision;sit to/from stand Pt Will Transfer to Toilet: with supervision;ambulating;bedside commode (over toilet) Pt Will Perform Toileting - Clothing  Manipulation and hygiene: with supervision;sit to/from stand  OT Frequency: Min 2X/week   Barriers to D/C: Decreased caregiver support  Lives with sister-in-law who can only provide supervision level assist       Co-evaluation              End of Session Equipment Utilized During Treatment: Gait belt;Rolling walker Nurse Communication: Mobility status  Activity Tolerance: Patient tolerated treatment well Patient left: in chair;with call bell/phone within reach   Time: 0936-1001 OT Time Calculation (min): 25 min Charges:  OT General Charges $OT Visit: 1 Procedure OT Evaluation $OT Eval Moderate Complexity: 1 Procedure OT Treatments $Self Care/Home Management : 8-22 mins G-Codes: OT G-codes **NOT FOR INPATIENT CLASS** Functional Assessment Tool Used: clinical judgement Functional Limitation: Self care Self Care Current Status [id number]): At least 20 percent but less than 40 percent impaired, limited or restricted Self Care Goal Status [id number]): At least 1 percent but less than 20 percent impaired, limited or restricted  Redmond Baseman, OTR/L Pager: [phone] 02/09/2016, 11:11 AM

## 2016-02-09 NOTE — Discharge Summary (Signed)
Name: Cynthia Hicks MRN: [id number] DOB: 05/19/1933 82 y.o. PCP: Raelyn Number, MD  Date of Admission: 02/07/2016  2:58 PM Date of Discharge: 02/09/2016 Attending Physician: Bartholomew Crews, MD  Discharge Diagnosis: 1. Episode of profound bradycardia and hypotension  Active Problems:   Diabetes mellitus (HCC)   Anemia of chronic disease   Autoimmune hepatitis ([place])   Coronary artery disease   Chronic back pain   Chronic kidney disease   Chronic diastolic CHF (congestive heart failure) (HCC)   PAF (paroxysmal atrial fibrillation) (HCC)   Bradycardia   Pressure ulcer stage II  Discharge Medications:   Medication List    STOP taking these medications        dofetilide 125 MCG capsule  Commonly known as:  TIKOSYN     gabapentin 100 MG capsule  Commonly known as:  NEURONTIN     levofloxacin 750 MG tablet  Commonly known as:  LEVAQUIN     losartan 25 MG tablet  Commonly known as:  COZAAR     magnesium oxide 400 (241.3 Mg) MG tablet  Commonly known as:  MAG-OX     oxyCODONE 5 MG immediate release tablet  Commonly known as:  Oxy IR/ROXICODONE      TAKE these medications        acetaminophen 325 MG tablet  Commonly known as:  TYLENOL  Take 2 tablets (650 mg total) by mouth every 6 (six) hours as needed for mild pain (or Fever >/= 101).     alendronate 70 MG tablet  Commonly known as:  FOSAMAX  Take 70 mg by mouth every Monday. Take with a full glass of water on an empty stomach.     apixaban 2.5 MG Tabs tablet  Commonly known as:  ELIQUIS  Take 1 tablet (2.5 mg total) by mouth 2 (two) times daily.     bisoprolol 5 MG tablet  Commonly known as:  ZEBETA  Take 1 tablet (5 mg total) by mouth daily.     clotrimazole 10 MG troche  Commonly known as:  MYCELEX  Take 10 mg by mouth 5 (five) times daily.     collagenase ointment  Commonly known as:  SANTYL  Apply topically daily.     morphine 60 MG 12 hr tablet  Commonly known as:  MS CONTIN  Take 1  tablet (60 mg total) by mouth every 8 (eight) hours.     multivitamin tablet  Take 1 tablet by mouth daily.     nitroGLYCERIN 0.4 MG SL tablet  Commonly known as:  NITROSTAT  Place 1 tablet (0.4 mg total) under the tongue every 5 (five) minutes as needed for chest pain.     omeprazole 20 MG capsule  Commonly known as:  PRILOSEC  Take 20 mg by mouth daily.     ondansetron 8 MG tablet  Commonly known as:  ZOFRAN  Take 8 mg by mouth every 8 (eight) hours as needed for nausea or vomiting.     polyethylene glycol packet  Commonly known as:  MIRALAX / GLYCOLAX  Take 17 g by mouth daily as needed for mild constipation or moderate constipation.     polyvinyl alcohol 1.4 % ophthalmic solution  Commonly known as:  LIQUIFILM TEARS  Place 1 drop into both eyes daily as needed for dry eyes.     pravastatin 80 MG tablet  Commonly known as:  PRAVACHOL  Take 1 tablet (80 mg total) by mouth every evening.  predniSONE 10 MG tablet  Commonly known as:  DELTASONE  Take 10 mg by mouth daily.     RELION R IJ  Inject 4-16 Units as directed 3 (three) times daily. BS 120-200=4 units, 201-250=6 units, 251-300=8 units, 301-350=10 units, 351-400=14 units, 401-450=16 units.     senna 8.6 MG Tabs tablet  Commonly known as:  SENOKOT  Take 1 tablet (8.6 mg total) by mouth at bedtime as needed for moderate constipation.     vitamin C 1000 MG tablet  Take 1,000 mg by mouth 2 (two) times daily.     VITAMIN D PO  Take 2,000 Units by mouth at bedtime.        Disposition and follow-up:   Cynthia Hicks was discharged from [facility] in Stable condition.  At the hospital follow up visit please address:  1. Hypotension - baseline bp 110s/60s, may develop if diarrhea or poor po intake recurs, dc'd Losartan given normotension - pAfib - continued bisoprolol and apixaban, dc'd Tikosyn given concern for bradycardia - Anemia of chronic disease, unclear etiology, EPO elevated, s/p 1  unit pRBC this admission, may require regular CBCs and outpatient transfusions  - Diarrhea - ensure laxatives/stool softeners remain PRN, find adequate balance with her scheduled MS Contin constipation - Malnutrition, Albumin 2.2 on admission, nutrition recs increased protein shake intake  2.  Labs / imaging needed at time of follow-up: ECG, CBC, CMP  3.  Pending labs/ test needing follow-up: None  Follow-up Appointments: Follow-up Information    Follow up with [name], MD. Go on 02/27/2016.   Specialty:  Cardiology   Why:  At 4:00 PM   Contact information:   [address] [place] [address] [phone]       Hospital Course by problem list: Active Problems:   Diabetes mellitus ([place])   Anemia of chronic disease   Autoimmune hepatitis ([place])   Coronary artery disease   Chronic back pain   Chronic kidney disease   Chronic diastolic CHF (congestive heart failure) (HCC)   PAF (paroxysmal atrial fibrillation) (HCC)   Bradycardia   Pressure ulcer stage II   1. Profound hypotension and bradycardia, presented on 02/07/16 hypotensive to 70s/40s and bradycardic to 40s, Cr elevated to approx 1.9, Hb 7.7, improved with IVF and 1u pRBCs and holding all cardiac/HTN medications. EKG/trops/ECHO negative for acute MI or structural cause. Etiology felt to be due to diarrhea and poor po intake (2/2 gen weakness/anemia) causing acute on chronic kidney injury, impairing clearance of Tikosyn, which became supratherapeutic and produced profound bradycardia. Bisoprolol and apixaban restarted prior to discharge without incident, Tikosyn and losartan discontinued.   2. Anemia of chronic disease, Hb 7.7 on admission, producing generalized weakness and poor po intake in patient, received 1u pRBCs which improved Hb to 9 on day 2, and Hb continued to improve to 10.2 by day 3. Erythropoietin level checked and found to be appropriately elevated. Will likely require regular CBC checks and  potentially outpatient transfusions.   3. Paroxysmal AFib, controlled, non-issue throughout admission. Restarted bisoprolol and apixaban, but discontinued tikosyn.  4. Diarrhea/loose stool, per patient 3-4x/day for a full month before admission, apparently taking miralax/senna/mag-ox at SNF throughout this, these were held on admission and her BMs improved to 2x/day, c. Diff negative  5. Acute on chronic kidney injury, likely prerenal due to dehydration, Cr 1.9 on admission, improved to baseline by discharge with IVF and good po intake  6. Hyperkalemia, up to 6  on admission, resolved with 1x kayexalate  7. Autoimmune liver disease, chronic, continued home prednisone without incident  8. Chronic back pain, continued home MS Contin without incident, did not to require gabapentin and prn oxycodone so discontinued to reduce polypharmacy, mobility improved and working well with PT/OT by day of discharge  9. Malnutrition/deconditioning, poor intake and inactivity, weakness from anemia and immobility due to recent bilateral septic knee arthritis, Albumin 2.2 this admission, nutrition recommended mighty protein shake with meals  Discharge Vitals:   BP 110/59 mmHg  Pulse 79  Temp(Src) 97.6 F (36.4 C) (Oral)  Resp 16  Ht 5\' 1"  (1.549 m)  Wt 50.44 kg (111 lb 3.2 oz)  BMI 21.02 kg/m2  SpO2 97%  Pertinent Labs, Studies, and Procedures:   CBC Latest Ref Rng 02/09/2016 02/08/2016 02/07/2016  WBC 4.0 - 10.5 K/uL 5.4 3.8(L) 3.7(L)  Hemoglobin 12.0 - 15.0 g/dL 10.2(L) 8.8(L) 9.0(L)  Hematocrit 36.0 - 46.0 % 32.0(L) 27.5(L) 28.5(L)  Platelets 150 - 400 K/uL 138(L) 125(L) 131(L)    BMP Latest Ref Rng 02/09/2016 02/08/2016 02/07/2016  Glucose 65 - 99 mg/dL 81 108(H) 124(H)  BUN 6 - 20 mg/dL 29(H) 32(H) 31(H)  Creatinine 0.44 - 1.00 mg/dL 1.34(H) 1.70(H) 1.85(H)  Sodium 135 - 145 mmol/L 139 136 132(L)  Potassium 3.5 - 5.1 mmol/L 4.3 4.5 6.0(H)  Chloride 101 - 111 mmol/L 104 100(L)  98(L)  CO2 22 - 32 mmol/L 28 27 28   Calcium 8.9 - 10.3 mg/dL 7.6(L) 7.1(L) 7.2(L)    Ref Range 1d ago - 02/08/2016    Erythropoietin 2.6 - 18.5 mIU/mL 123.0 (H)        Transthoracic Echocardiography - 02/08/2016  Study Conclusions  - Left ventricle: The cavity size was normal. Systolic function was  normal. The estimated ejection fraction was in the range of 60%  to 65%. Wall motion was normal; there were no regional wall  motion abnormalities. Features are consistent with a pseudonormal  left ventricular filling pattern, with concomitant abnormal  relaxation and increased filling pressure (grade 2 diastolic  dysfunction). Doppler parameters are consistent with high  ventricular filling pressure. - Aortic valve: Moderate diffuse thickening and calcification,  consistent with sclerosis. - Mitral valve: Mild, late systolicsystolic bowing without  prolapse, involving the anterior leaflet. There was mild  regurgitation directed eccentrically and toward the free wall. - Left atrium: The atrium was mildly dilated. - Pulmonary arteries: PA peak pressure: 48 mm Hg (S).  Impressions:  - The right ventricular systolic pressure was increased consistent  with moderate pulmonary hypertension.  EKG 12-lead - 02/08/2016    Discharge Instructions: Discharge Instructions    (HEART FAILURE PATIENTS) Call MD:  Anytime you have any of the following symptoms: 1) 3 pound weight gain in 24 hours or 5 pounds in 1 week 2) shortness of breath, with or without a dry hacking cough 3) swelling in the hands, feet or stomach 4) if you have to sleep on extra pillows at night in order to breathe.    Complete by:  As directed      Call MD for:  difficulty breathing, headache or visual disturbances    Complete by:  As directed      Call MD for:  extreme fatigue    Complete by:  As directed      Call MD for:  persistant dizziness or light-headedness    Complete by:  As directed      Call MD  for:  persistant nausea and vomiting  Complete by:  As directed      Call MD for:  severe uncontrolled pain    Complete by:  As directed      Call MD for:  temperature >100.4    Complete by:  As directed      Diet - low sodium heart healthy    Complete by:  As directed      Discharge instructions    Complete by:  As directed   Please continue to take your medications as prescribed. Continue trying to eat and drink as much as comfortably possible throughout the day, particularly protein shakes and supplements. You are currently quite malnourished and prone to becoming dehydrated. Please notify us or your immediate healthcare team if you experience new chest pain, palpitations, worsening weakness, or difficulty breathing.   Please attend follow up appointment with your cardiologist (Dr. Aundra Dubin or a clinical partner of his/NP) to discuss the discontinuation of meds: Tikosyn and Losartan and management of your diastolic heart failure, pulmonary hypertension, and atrial fibrillation.     Increase activity slowly    Complete by:  As directed            Signed: Asencion Partridge, MD 02/09/2016, 10:40 AM   Pager: [phone]

## 2016-02-09 NOTE — Progress Notes (Signed)
Subjective: Cynthia Hicks is feeling well today, with improved energy and appetite. She walked up and down the halls with PT yesterday and has had excellent PO intake. She has not complained of pain nor any new symptoms and her vitals remain stable. He bisoprolol and apixaban were restarted last night without incident.   Objective: Vital signs in last 24 hours: Filed Vitals:   02/08/16 1918 02/08/16 2115 02/08/16 2140 02/09/16 0438  BP: 101/65 104/52  110/59  Pulse: 88 73 67 79  Temp: 97.5 F (36.4 C)   97.6 F (36.4 C)  TempSrc: Oral   Oral  Resp: 22 23 24 16   Height:    5\' 1"  (1.549 m)  Weight:    50.44 kg (111 lb 3.2 oz)  SpO2: 98% 98% 96% 97%   Physical Exam  General appearance: Elderly woman resting comfortably in bedside chair, conversational Cardiovascular: RRR, no audible murmurs, rubs, gallops Respiratory: Clear to auscultation bilaterally, normal work of breathing Abdomen: Soft, non-tender, non-distended Skin: Extensive dark bruising over forearms and red bruises over upper chest, mottled erythematous dry skin and bruising of shins/feet, skin is warm and dry Ext: 1+ pitting edema to her knees in BLE, surgical scars over both knees, which are nontender  Neuro: CNs II-XII grossly intact, alert and oriented Psych: Positive affect  Labs / Imaging / Procedures:  CBC Latest Ref Rng 02/09/2016 02/08/2016 02/07/2016  WBC 4.0 - 10.5 K/uL 5.4 3.8(L) 3.7(L)  Hemoglobin 12.0 - 15.0 g/dL 10.2(L) 8.8(L) 9.0(L)  Hematocrit 36.0 - 46.0 % 32.0(L) 27.5(L) 28.5(L)  Platelets 150 - 400 K/uL 138(L) 125(L) 131(L)    BMP Latest Ref Rng 02/09/2016 02/08/2016 02/07/2016  Glucose 65 - 99 mg/dL 81 108(H) 124(H)  BUN 6 - 20 mg/dL 29(H) 32(H) 31(H)  Creatinine 0.44 - 1.00 mg/dL 1.34(H) 1.70(H) 1.85(H)  Sodium 135 - 145 mmol/L 139 136 132(L)  Potassium 3.5 - 5.1 mmol/L 4.3 4.5 6.0(H)  Chloride 101 - 111 mmol/L 104 100(L) 98(L)  CO2 22 - 32 mmol/L 28 27 28   Calcium 8.9 - 10.3 mg/dL 7.6(L) 7.1(L) 7.2(L)     Assessment/Plan: [name] is an 82 year old woman with an extensive PMH detailed above but most significant for new diarrhea, anemia of chronic disease, CAD s/p MIs/PCI, HFpEF , Afib on apixaban who presented with profound bradycardia and hypotension.    1. Hypotension, likely secondary to bradycardia and hypovolemia, resolved, likely due to diarrhea/poor po intake leading to hypovolemia, AoC kidney failure likely impaired clearance of Tikosyn - Hold tikosyn and laxatives - ECHO largely unremarkable yesterday - Telemetry largely unremarkable, a few questionable brief runs overnight (2-5 sec) of sinus tach vs afib  2. Anemia of chronic disease, improved, Hb 10.2 from 8.8  - Epo level appropriated elevated  - CBC at least weekly upon discharge  3. PAF, controlled  - Restart  Bisoprolol 5mg , reassess vitals at noon  - Continue Apixaban  - Discontinue Tikosyn given ongoing risk of dehydration/AKI/impaired clearance and prolonged QTc  - F/up with Cardiology (Dr. Aundra Dubin)  4. Diarrhea/loose stool, improving, 2x day unclear etiology, likely due to ambitious laxative regimen from previous admission - C diff negative - Hold home mag-ox, miralax, senna, change to PRN at discharge  5. AKI, Cr 1.3.4 from 1.7, resolved - hold apixaban for now, restart when Cr at baseline (~1.2-3), renally-dose when restarting - avoid NSAIDs - Daily BMP  6. Hyperkalemia, 4.3 from 4.5 overnight, resolved -  BMP check weekly, kayexalate as needed  7.  Autoimmune liver disease, chronic - continue home prednisone  8. Chronic back pain - Home MS Contin 60mg  TID sch  - PT/OT - tylenol 650 Q6 prn  9. Malnutrition/deconditioning, secondary to acute illness, weakness from anemia, immobility due to bilateral septic knee arthritis, Albumin 2.2 this  admission  - Nutrition consult recs: mighty protein shake with meals  FEN: IVF, normal diet + mighty shakes  DVT ppx: lovenox  Dispo: Anticipated discharge today.    LOS: 2 days   Asencion Partridge, MD 02/09/2016, 9:31 AM Pager: [phone]

## 2016-02-09 NOTE — Care Management Obs Status (Signed)
[place] NOTIFICATION   Patient Details  Name: Cynthia Hicks MRN: [id number] Date of Birth: 18-Jul-1933   Medicare Observation Status Notification Given:  Yes    Erenest Rasher, RN 02/09/2016, 10:54 AM

## 2016-02-09 NOTE — Discharge Instructions (Signed)

## 2016-02-09 NOTE — Clinical Social Work Note (Signed)
Clinical Social Worker facilitated patient discharge including contacting patient family and facility to confirm patient discharge plans.  Clinical information faxed to facility and family agreeable with plan.  CSW arranged transport with patient son to [place].  RN to call report prior to discharge.  Clinical Social Worker will sign off for now as social work intervention is no longer needed. Please consult [place] again if new need arises.  Barbette Or, [place]

## 2016-02-13 ENCOUNTER — Telehealth (HOSPITAL_COMMUNITY): Payer: Self-pay | Admitting: Cardiology

## 2016-02-13 ENCOUNTER — Telehealth: Payer: Self-pay | Admitting: Physician Assistant

## 2016-02-13 NOTE — Telephone Encounter (Signed)
Nuclear stress test scheduled for 01/27/2016 cancelled pt is currently in-patient rehab

## 2016-02-13 NOTE — Telephone Encounter (Signed)
Patient given upcoming appt with Dr Aundra Dubin Parking code and instructions given to patient, verbalized understanding

## 2016-02-16 ENCOUNTER — Ambulatory Visit (INDEPENDENT_AMBULATORY_CARE_PROVIDER_SITE_OTHER): Payer: Medicare Other | Admitting: Internal Medicine

## 2016-02-16 ENCOUNTER — Encounter: Payer: Self-pay | Admitting: Internal Medicine

## 2016-02-16 VITALS — BP 98/60 | HR 73 | Temp 97.5°F | Ht 61.0 in | Wt 111.0 lb

## 2016-02-16 DIAGNOSIS — I890 Lymphedema, not elsewhere classified: Secondary | ICD-10-CM | POA: Diagnosis not present

## 2016-02-16 DIAGNOSIS — I872 Venous insufficiency (chronic) (peripheral): Secondary | ICD-10-CM

## 2016-02-16 DIAGNOSIS — M00269 Other streptococcal arthritis, unspecified knee: Secondary | ICD-10-CM

## 2016-02-16 DIAGNOSIS — I2510 Atherosclerotic heart disease of native coronary artery without angina pectoris: Secondary | ICD-10-CM | POA: Diagnosis not present

## 2016-02-16 NOTE — Progress Notes (Signed)
   Subjective:    Patient ID: MARGREE LACKMAN, female    DOB: 11-18-1932, 82 y.o.   MRN: [id number]  HPI She is here for follow up of bilateral septic arthritis of her knees.  I saw her in the hospital in June when she had worsening knee effusions concerning for gout but did not respond to steroids.  She went for a washout by Dr. Sharol Given 01/08/16 and had some WBCs and cultures grew pansensitive Pseudomonas.  She was put on renally adjusted levaquin for 3 weeks and now has been off it for the last 2 weeks.  Aspiration of her knee noted 7800 WBCs prior to surgery.  She also since has been hospitalized for hypotension with bradycardia and discharged about 1 week ago.  She has a history of autoimmune hepatitis and has been on chronic steroids for this.     Reportedly she is here today regarding her right leg.  Has had bouts of cellulitis.  Was previously treated for osteomyelitis of her left foot, 5th metatarsal, with vancomycin and was seen by Dr. Baxter Flattery in March 2017.  Treatment was completed and xray of foot did note osteomyelitis.  No problems with her left foot or leg now.  Right leg with some erythema, non draining dry scab on back of heel.  No open lesions but does have dry cracked foot.  No fever, no chills.  Tells me that Dr. Felipa Eth sent her here for evaluation of her left leg. History of Strep viridans bacteremia in 2015.  Reported history of MSSA infection with bacteremia last year treated with cefazolin for 6 weeks.     Review of Systems  Constitutional: Negative for fever, chills and fatigue.  Gastrointestinal: Positive for nausea. Negative for diarrhea.  Skin: Positive for rash.  Neurological: Negative for dizziness.       Objective:   Physical Exam  Constitutional: She appears well-developed and well-nourished. No distress.  Eyes: No scleral icterus.  Musculoskeletal: She exhibits edema.  Right leg with 1-2+ pitting edema  Skin: Rash noted.  Right leg with blanching erythema,  patchy, no warmth, no tenderness.  Right great toenail with some onychomycosis.      Social History   Social History  . Marital Status: Married    Spouse Name: Jenny Reichmann  . Number of Children: 2  . Years of Education: N/A   Occupational History  . real Sport and exercise psychologist     retired   Social History Main Topics  . Smoking status: Never Smoker   . Smokeless tobacco: Never Used  . Alcohol Use: No  . Drug Use: No  . Sexual Activity: Not on file   Other Topics Concern  . Not on file   Social History Narrative   Pt lives in [place] with husband.  She is retired.  She is not very active @ home.        Assessment & Plan:

## 2016-02-16 NOTE — Assessment & Plan Note (Signed)
Her legs now are not infected and I would recommend determining when concerns of cellulitis develop to be sure to distinguish infection vs chronic vascular findings.   If infection develops, I would still recommend typical cellulitis treatment with keflex, +/- doxycycline if she has any purulence.  No indication for IV therapy for cellulitis unless severe.

## 2016-02-16 NOTE — Assessment & Plan Note (Signed)
Chronic of the left leg.  Also with blanching erythema.

## 2016-02-16 NOTE — Assessment & Plan Note (Signed)
Pseudomonas infection, now resolved and doing well.

## 2016-02-23 ENCOUNTER — Telehealth (HOSPITAL_COMMUNITY): Payer: Self-pay | Admitting: Vascular Surgery

## 2016-02-23 ENCOUNTER — Inpatient Hospital Stay (HOSPITAL_COMMUNITY)
Admission: EM | Admit: 2016-02-23 | Discharge: 2016-02-29 | DRG: 309 | Disposition: A | Discharge status: Skilled Nursing Facility | Payer: Medicare Other | Attending: Internal Medicine | Admitting: Internal Medicine

## 2016-02-23 ENCOUNTER — Encounter (HOSPITAL_COMMUNITY): Payer: Medicare Other

## 2016-02-23 ENCOUNTER — Encounter (HOSPITAL_COMMUNITY): Payer: Self-pay | Admitting: *Deleted

## 2016-02-23 DIAGNOSIS — Z85820 Personal history of malignant melanoma of skin: Secondary | ICD-10-CM

## 2016-02-23 DIAGNOSIS — L8990 Pressure ulcer of unspecified site, unspecified stage: Secondary | ICD-10-CM | POA: Diagnosis present

## 2016-02-23 DIAGNOSIS — I272 Other secondary pulmonary hypertension: Secondary | ICD-10-CM | POA: Diagnosis present

## 2016-02-23 DIAGNOSIS — Z66 Do not resuscitate: Secondary | ICD-10-CM

## 2016-02-23 DIAGNOSIS — E785 Hyperlipidemia, unspecified: Secondary | ICD-10-CM | POA: Diagnosis present

## 2016-02-23 DIAGNOSIS — R001 Bradycardia, unspecified: Secondary | ICD-10-CM

## 2016-02-23 DIAGNOSIS — N184 Chronic kidney disease, stage 4 (severe): Secondary | ICD-10-CM | POA: Diagnosis present

## 2016-02-23 DIAGNOSIS — Z91013 Allergy to seafood: Secondary | ICD-10-CM

## 2016-02-23 DIAGNOSIS — N179 Acute kidney failure, unspecified: Secondary | ICD-10-CM | POA: Diagnosis not present

## 2016-02-23 DIAGNOSIS — Z8249 Family history of ischemic heart disease and other diseases of the circulatory system: Secondary | ICD-10-CM

## 2016-02-23 DIAGNOSIS — Z7189 Other specified counseling: Secondary | ICD-10-CM

## 2016-02-23 DIAGNOSIS — M545 Low back pain, unspecified: Secondary | ICD-10-CM

## 2016-02-23 DIAGNOSIS — M7989 Other specified soft tissue disorders: Secondary | ICD-10-CM

## 2016-02-23 DIAGNOSIS — D72829 Elevated white blood cell count, unspecified: Secondary | ICD-10-CM

## 2016-02-23 DIAGNOSIS — Z881 Allergy status to other antibiotic agents status: Secondary | ICD-10-CM

## 2016-02-23 DIAGNOSIS — E872 Acidosis: Secondary | ICD-10-CM | POA: Diagnosis present

## 2016-02-23 DIAGNOSIS — L89621 Pressure ulcer of left heel, stage 1: Secondary | ICD-10-CM | POA: Diagnosis present

## 2016-02-23 DIAGNOSIS — D638 Anemia in other chronic diseases classified elsewhere: Secondary | ICD-10-CM | POA: Diagnosis present

## 2016-02-23 DIAGNOSIS — I480 Paroxysmal atrial fibrillation: Secondary | ICD-10-CM | POA: Diagnosis not present

## 2016-02-23 DIAGNOSIS — K5900 Constipation, unspecified: Secondary | ICD-10-CM | POA: Diagnosis present

## 2016-02-23 DIAGNOSIS — Z79899 Other long term (current) drug therapy: Secondary | ICD-10-CM

## 2016-02-23 DIAGNOSIS — E1122 Type 2 diabetes mellitus with diabetic chronic kidney disease: Secondary | ICD-10-CM | POA: Diagnosis present

## 2016-02-23 DIAGNOSIS — I5022 Chronic systolic (congestive) heart failure: Secondary | ICD-10-CM

## 2016-02-23 DIAGNOSIS — N183 Chronic kidney disease, stage 3 unspecified: Secondary | ICD-10-CM

## 2016-02-23 DIAGNOSIS — G8929 Other chronic pain: Secondary | ICD-10-CM | POA: Diagnosis present

## 2016-02-23 DIAGNOSIS — L89611 Pressure ulcer of right heel, stage 1: Secondary | ICD-10-CM | POA: Diagnosis present

## 2016-02-23 DIAGNOSIS — R0600 Dyspnea, unspecified: Secondary | ICD-10-CM

## 2016-02-23 DIAGNOSIS — Z7952 Long term (current) use of systemic steroids: Secondary | ICD-10-CM

## 2016-02-23 DIAGNOSIS — Z888 Allergy status to other drugs, medicaments and biological substances status: Secondary | ICD-10-CM

## 2016-02-23 DIAGNOSIS — L89152 Pressure ulcer of sacral region, stage 2: Secondary | ICD-10-CM | POA: Diagnosis present

## 2016-02-23 DIAGNOSIS — Z515 Encounter for palliative care: Secondary | ICD-10-CM

## 2016-02-23 DIAGNOSIS — Z794 Long term (current) use of insulin: Secondary | ICD-10-CM

## 2016-02-23 DIAGNOSIS — I130 Hypertensive heart and chronic kidney disease with heart failure and stage 1 through stage 4 chronic kidney disease, or unspecified chronic kidney disease: Secondary | ICD-10-CM | POA: Diagnosis present

## 2016-02-23 DIAGNOSIS — I4819 Other persistent atrial fibrillation: Secondary | ICD-10-CM

## 2016-02-23 DIAGNOSIS — Z955 Presence of coronary angioplasty implant and graft: Secondary | ICD-10-CM

## 2016-02-23 DIAGNOSIS — E1151 Type 2 diabetes mellitus with diabetic peripheral angiopathy without gangrene: Secondary | ICD-10-CM | POA: Diagnosis present

## 2016-02-23 DIAGNOSIS — K754 Autoimmune hepatitis: Secondary | ICD-10-CM | POA: Diagnosis present

## 2016-02-23 DIAGNOSIS — I252 Old myocardial infarction: Secondary | ICD-10-CM

## 2016-02-23 DIAGNOSIS — I890 Lymphedema, not elsewhere classified: Secondary | ICD-10-CM | POA: Diagnosis present

## 2016-02-23 DIAGNOSIS — E11649 Type 2 diabetes mellitus with hypoglycemia without coma: Secondary | ICD-10-CM | POA: Diagnosis present

## 2016-02-23 DIAGNOSIS — R109 Unspecified abdominal pain: Secondary | ICD-10-CM

## 2016-02-23 DIAGNOSIS — Z7901 Long term (current) use of anticoagulants: Secondary | ICD-10-CM

## 2016-02-23 DIAGNOSIS — I2510 Atherosclerotic heart disease of native coronary artery without angina pectoris: Secondary | ICD-10-CM

## 2016-02-23 DIAGNOSIS — I5032 Chronic diastolic (congestive) heart failure: Secondary | ICD-10-CM | POA: Diagnosis present

## 2016-02-23 DIAGNOSIS — I959 Hypotension, unspecified: Secondary | ICD-10-CM

## 2016-02-23 LAB — I-STAT CHEM 8, ED
BUN: 23 mg/dL — AB (ref 6–20)
CALCIUM ION: 1.14 mmol/L (ref 1.12–1.23)
Chloride: 97 mmol/L — ABNORMAL LOW (ref 101–111)
Creatinine, Ser: 1.1 mg/dL — ABNORMAL HIGH (ref 0.44–1.00)
Glucose, Bld: 188 mg/dL — ABNORMAL HIGH (ref 65–99)
HEMATOCRIT: 21 % — AB (ref 36.0–46.0)
Hemoglobin: 7.1 g/dL — ABNORMAL LOW (ref 12.0–15.0)
Potassium: 4.7 mmol/L (ref 3.5–5.1)
SODIUM: 139 mmol/L (ref 135–145)
TCO2: 34 mmol/L (ref 0–100)

## 2016-02-23 LAB — URINALYSIS, ROUTINE W REFLEX MICROSCOPIC
Bilirubin Urine: NEGATIVE
Glucose, UA: NEGATIVE mg/dL
Hgb urine dipstick: NEGATIVE
Ketones, ur: NEGATIVE mg/dL
Leukocytes, UA: NEGATIVE
Nitrite: NEGATIVE
Protein, ur: NEGATIVE mg/dL
Specific Gravity, Urine: 1.011 (ref 1.005–1.030)
pH: 7.5 (ref 5.0–8.0)

## 2016-02-23 LAB — COMPREHENSIVE METABOLIC PANEL
ALT: 10 U/L — ABNORMAL LOW (ref 14–54)
AST: 18 U/L (ref 15–41)
Albumin: 2.8 g/dL — ABNORMAL LOW (ref 3.5–5.0)
Alkaline Phosphatase: 57 U/L (ref 38–126)
Anion gap: 7 (ref 5–15)
BUN: 18 mg/dL (ref 6–20)
CO2: 30 mmol/L (ref 22–32)
Calcium: 8.6 mg/dL — ABNORMAL LOW (ref 8.9–10.3)
Chloride: 102 mmol/L (ref 101–111)
Creatinine, Ser: 1.26 mg/dL — ABNORMAL HIGH (ref 0.44–1.00)
GFR calc Af Amer: 45 mL/min — ABNORMAL LOW (ref >60)
GFR calc non Af Amer: 39 mL/min — ABNORMAL LOW (ref >60)
Glucose, Bld: 187 mg/dL — ABNORMAL HIGH (ref 65–99)
Potassium: 4.8 mmol/L (ref 3.5–5.1)
Sodium: 139 mmol/L (ref 135–145)
Total Bilirubin: 0.5 mg/dL (ref 0.3–1.2)
Total Protein: 5.1 g/dL — ABNORMAL LOW (ref 6.5–8.1)

## 2016-02-23 LAB — PROTIME-INR
INR: 1.12 (ref 0.00–1.49)
Prothrombin Time: 14.6 seconds (ref 11.6–15.2)

## 2016-02-23 LAB — TYPE AND SCREEN
ABO/RH(D): O POS
Antibody Screen: NEGATIVE

## 2016-02-23 LAB — I-STAT CG4 LACTIC ACID, ED: LACTIC ACID, VENOUS: 2.26 mmol/L — AB (ref 0.5–1.9)

## 2016-02-23 LAB — POC OCCULT BLOOD, ED: Fecal Occult Bld: NEGATIVE

## 2016-02-23 LAB — I-STAT TROPONIN, ED: Troponin i, poc: 0.01 ng/mL (ref 0.00–0.08)

## 2016-02-23 LAB — APTT: aPTT: 30 seconds (ref 24–37)

## 2016-02-23 MED ORDER — SODIUM CHLORIDE 0.9 % IV BOLUS (SEPSIS)
1000.0000 mL | Freq: Once | INTRAVENOUS | Status: AC
  Administered 2016-02-23: 1000 mL via INTRAVENOUS

## 2016-02-23 MED ORDER — FENTANYL CITRATE (PF) 100 MCG/2ML IJ SOLN: 25.0000 ug | Freq: Once | INTRAMUSCULAR | Status: DC

## 2016-02-23 MED ORDER — MORPHINE SULFATE (PF) 4 MG/ML IV SOLN
8.0000 mg | Freq: Once | INTRAVENOUS | Status: AC
  Administered 2016-02-23: 8 mg via INTRAVENOUS
  Filled 2016-02-23: qty 2

## 2016-02-23 NOTE — ED Notes (Signed)
Iv team unable to obtain IV access or blood work , MD and PA notified

## 2016-02-23 NOTE — ED Notes (Signed)
PA at bedside.

## 2016-02-23 NOTE — ED Provider Notes (Signed)
Patient care assumed at shift change. For full HPI, see initial provider's note.  In short, pt presenting with bradycardia and hypotension from SNF. Pt recently hospitalized for transfusion due to anemia, bradycardia and hypotension. Pending CBC and hemoccult results. Anticipate obs admission.  Blood clotted at lab x 2. Significant delays in lab draws for CBC. Pt resting comfortably with no acute complaints. Redraw for CBC ordered.    Negative hemoccult. Hgb 8.5. Noted to have leukocytosis of 28.  Patient is an internal medicine patient. Consulted them for admission and further evaluation of leukocytosis and monitoring blood pressure and heart rate. Patient is obs telemetry.    Cynthia Crocker Mera Gunkel, PA-C 02/24/16 [place], MD 02/29/16 [phone]

## 2016-02-23 NOTE — Telephone Encounter (Signed)
Pt left message , I returned her call pt is on her way to ED she was having problems

## 2016-02-23 NOTE — ED Notes (Signed)
RN attempted IV access unsuccessfully.

## 2016-02-23 NOTE — ED Notes (Signed)
The staff at [facility] reported Pt complained of Lt arm pain and jaw pain. . Pt was hypotensive at SNF. SBP 98. Pt has A Hx of Afib and lolw HGB. Pt has LG  Bruise on Bil posterior forearm.. PT also reports having a open wound to sacral area.

## 2016-02-23 NOTE — ED Provider Notes (Signed)
CSN: [id number]     Arrival date & time 02/23/16  1429 History   First MD Initiated Contact with Patient 02/23/16 1447     Chief Complaint  Patient presents with  . Arm Pain     (Consider location/radiation/quality/duration/timing/severity/associated sxs/prior Treatment) HPI Cynthia Hicks is a 82 y.o. female with multiple medical problems, recent admission and blood transfusion secondary to anemia on 7/4, here for evaluation of hypotension and bradycardia. Patient presents from her skilled nursing facility, white cell make sonic, reportedly had hypotensive episode with systolic pressure in the [id number], bradycardia in the 40s. Patient takes apixaban for A. fib,  bisoprolol. She reports being placed on iron therapy which has caused her to become nauseous when she takes it. She reports this is what occurred this morning-took her iron supplement, experienced abdominal discomfort and subsequent nausea and vomiting. She denies any chest pain, shortness of breath, Dark or bloody stool. She denies any arm pain or jaw pain. Reports she does have an acute decubitus ulcer that she has had "for years". She has a pressure dressing applied to the area that was just changed today, no surrounding erythema or drainage noted.  Past Medical History  Diagnosis Date  . Hypertension   . Arthritis   . Diabetes mellitus   . Coronary artery disease     a.  MI 01/2003;  b.  02/2006 NSTEMI - LAD 80-27m, LCX nl, RCA 23m - RCA stented w/ Taxus DES;   c.  2009 Neg MV;  d. 11/2010 Echo - EF 65%;  e. 02/2013 MV: Carlton Adam Cardiolite that was low risk with no ischemia.  . Autoimmune liver disease     a.  Followed @ [facility] - on chronic prednisone and  sirolimus  . Hyperlipidemia     a. inability to stake statins due to liver disease  . Cellulitis     a. chronic cellulitis of LE  . CKD (chronic kidney disease), stage IV ([place])   . Bulging disc   . Sciatica   . Chronic back pain     a.  On Methadone  . Skin cancer (melanoma) ([place])    . Esophageal stricture     narrowing  . Diabetic neuropathy ([place])   . Sepsis ([place])   . PAF (paroxysmal atrial fibrillation) ([place])     a. Tikosyn initiated 10/2014;  CHA2DS2VASc = 6 (eliquis).  . Chronic diastolic CHF (congestive heart failure) ([place])     a. 06/2014 TEE: EF 60-65%, Gr 2 DD, mild MR, mildly dil LA, Grade IV descending thoracic Ao plaque.  Marland Kitchen PAD (peripheral artery disease) ([place])   . Chronic acquired lymphedema   . Anemia 02/07/2016    REQUIRING A BLOOD TRANSFUSION   Past Surgical History  Procedure Laterality Date  . Cardiac catheterization      Normal EF  . Coronary angioplasty  2007    2 DES placement to RCA complicated by a wire perforation  . Tubal ligation  1982  . Elbow surgery Left 1994    neuroma removed   . Abscess drainage Right     side  . Lamenectomy  1989    only 1 disc surgery  . Heart stents  2007  and  2012    X's 1  and  2012 X's 2 stents  . Cardioversion N/A 10/31/2012    Procedure: CARDIOVERSION;  Surgeon: Larey Dresser, MD;  Location: [facility];  Service: Cardiovascular;  Laterality: N/A;  . Tee without cardioversion N/A 06/22/2014  Procedure: TRANSESOPHAGEAL ECHOCARDIOGRAM (TEE);  Surgeon: Larey Dresser, MD;  Location[facility] ENDOSCOPY;  Service: Cardiovascular;  Laterality: N/A;  . Left heart catheterization with coronary angiogram N/A 08/29/2011    Procedure: LEFT HEART CATHETERIZATION WITH CORONARY ANGIOGRAM;  Surgeon: Wellington Hampshire, MD;  Location: [facility] CATH LAB;  Service: Cardiovascular;  Laterality: N/A;  . Percutaneous coronary stent intervention (pci-s)  08/29/2011    Procedure: PERCUTANEOUS CORONARY STENT INTERVENTION (PCI-S);  Surgeon: Wellington Hampshire, MD;  Location[facility] C[facility] LAB;  Service: Cardiovascular;;  . Knee arthroscopy Bilateral 01/08/2016    Procedure: ARTHROSCOPY KNEE;  Surgeon: Newt Minion, MD;  Location: [facility];  Service: Orthopedics;  Laterality: Bilateral;   Family History  Problem Relation Age of Onset  . Lung  cancer Mother 75  . Heart attack Father 64  . Heart disease Father   . Congestive Heart Failure Father   . Heart disease Maternal Grandfather   . Hypertension Brother   . Hypertension Son   . Pancreatic cancer Mother    Social History  Substance Use Topics  . Smoking status: Never Smoker   . Smokeless tobacco: Never Used  . Alcohol Use: No   OB History    No data available     Review of Systems A 10 point review of systems was completed and was negative except for pertinent positives and negatives as mentioned in the history of present illness     Allergies  Cyclosporine; Azathioprine; Mycophenolate; Other; and Oysters  Home Medications   Prior to Admission medications   Medication Sig Start Date End Date Taking? Authorizing Provider  acetaminophen (TYLENOL) 325 MG tablet Take 2 tablets (650 mg total) by mouth every 6 (six) hours as needed for mild pain (or Fever >/= 101). 01/10/16   Liberty Handy, MD  alendronate (FOSAMAX) 70 MG tablet Take 70 mg by mouth every Monday. Take with a full glass of water on an empty stomach.    Historical Provider, MD  apixaban (ELIQUIS) 2.5 MG TABS tablet Take 1 tablet (2.5 mg total) by mouth 2 (two) times daily. 03/07/15   Larey Dresser, MD  Ascorbic Acid (VITAMIN C) 1000 MG tablet Take 1,000 mg by mouth 2 (two) times daily.     Historical Provider, MD  bisoprolol (ZEBETA) 5 MG tablet Take 1 tablet (5 mg total) by mouth daily. 05/06/15   Larey Dresser, MD  Cholecalciferol (VITAMIN D PO) Take 2,000 Units by mouth at bedtime.     Historical Provider, MD  clotrimazole (MYCELEX) 10 MG troche Take 10 mg by mouth 5 (five) times daily.    Historical Provider, MD  collagenase (SANTYL) ointment Apply topically daily. 02/09/16   Asencion Partridge, MD  Insulin Regular Human (RELION R IJ) Inject 4-16 Units as directed 3 (three) times daily. BS 120-200=4 units, 201-250=6 units, 251-300=8 units, 301-350=10 units, 351-400=14 units, 401-450=16 units.    Historical  Provider, MD  morphine (MS CONTIN) 60 MG 12 hr tablet Take 1 tablet (60 mg total) by mouth every 8 (eight) hours. 01/27/16   Jule Ser, DO  Multiple Vitamin (MULTIVITAMIN) tablet Take 1 tablet by mouth daily.    Historical Provider, MD  nitroGLYCERIN (NITROSTAT) 0.4 MG SL tablet Place 1 tablet (0.4 mg total) under the tongue every 5 (five) minutes as needed for chest pain. 09/01/11   Rogelia Mire, NP  omeprazole (PRILOSEC) 20 MG capsule Take 20 mg by mouth daily.  06/06/14   Historical Provider, MD  ondansetron (ZOFRAN) 8 MG  tablet Take 8 mg by mouth every 8 (eight) hours as needed for nausea or vomiting.    Historical Provider, MD  polyethylene glycol (MIRALAX / GLYCOLAX) packet Take 17 g by mouth daily as needed for mild constipation or moderate constipation. 02/09/16   Asencion Partridge, MD  polyvinyl alcohol (LIQUIFILM TEARS) 1.4 % ophthalmic solution Place 1 drop into both eyes daily as needed for dry eyes.    Historical Provider, MD  pravastatin (PRAVACHOL) 80 MG tablet Take 1 tablet (80 mg total) by mouth every evening. 10/11/14   Larey Dresser, MD  predniSONE (DELTASONE) 10 MG tablet Take 10 mg by mouth daily.     Historical Provider, MD  senna (SENOKOT) 8.6 MG TABS tablet Take 1 tablet (8.6 mg total) by mouth at bedtime as needed for moderate constipation. 02/09/16   Asencion Partridge, MD   BP 116/54 mmHg  Pulse 77  Temp(Src) 98.1 F (36.7 C) (Oral)  Resp 29  Ht 5\' 1"  (1.549 m)  SpO2 98% Physical Exam  Constitutional: She is oriented to person, place, and time. She appears well-developed and well-nourished.  Chronically ill-appearing. Alert and oriented 4.  HENT:  Head: Normocephalic and atraumatic.  Mouth/Throat: Oropharynx is clear and moist.  Eyes: Pupils are equal, round, and reactive to light. Right eye exhibits no discharge. Left eye exhibits no discharge. No scleral icterus.  Pale conjunctiva  Neck: Neck supple.  Cardiovascular: Normal rate, regular rhythm and normal heart  sounds.   Pulmonary/Chest: Effort normal and breath sounds normal. No respiratory distress. She has no wheezes. She has no rales.  Abdominal: Soft. There is no tenderness.  Genitourinary:  Soft brown stool on exam glove. No evidence of frank bleeding  Musculoskeletal: She exhibits no tenderness.  Neurological: She is alert and oriented to person, place, and time.  Cranial Nerves II-XII grossly intact  Skin: Skin is warm and dry. No rash noted.  Multiple patches of ecchymosis.  Psychiatric: She has a normal mood and affect.  Nursing note and vitals reviewed.   ED Course  Procedures (including critical care time) Labs Review Labs Reviewed  COMPREHENSIVE METABOLIC PANEL - Abnormal; Notable for the following:    Glucose, Bld 187 (*)    Creatinine, Ser 1.26 (*)    Calcium 8.6 (*)    Total Protein 5.1 (*)    Albumin 2.8 (*)    ALT 10 (*)    GFR calc non Af Amer 39 (*)    GFR calc Af Amer 45 (*)    All other components within normal limits  I-STAT CHEM 8, ED - Abnormal; Notable for the following:    Chloride 97 (*)    BUN 23 (*)    Creatinine, Ser 1.10 (*)    Glucose, Bld 188 (*)    Hemoglobin 7.1 (*)    HCT 21.0 (*)    All other components within normal limits  I-STAT CG4 LACTIC ACID, ED - Abnormal; Notable for the following:    Lactic Acid, Venous 2.26 (*)    All other components within normal limits  URINALYSIS, ROUTINE W REFLEX MICROSCOPIC (NOT AT [facility])  PROTIME-INR  APTT  CBC WITH DIFFERENTIAL/PLATELET  I-STAT TROPOININ, ED  POC OCCULT BLOOD, ED  TYPE AND SCREEN    Imaging Review No results found. I have personally reviewed and evaluated these images and lab results as part of my medical decision-making.   EKG Interpretation  Date/Time:  Thursday February 23 2016 14:42:03 EDT Ventricular Rate:  76 PR Interval:  180 QRS Duration: 92 QT Interval:  402 QTC Calculation: 452 R Axis:   -61 Text Interpretation:  Normal sinus rhythm Left anterior fascicular block ST \\T \  T wave abnormality, consider lateral ischemia Abnormal ECG inferior and latereal t wave inversions are new Confirmed by Kathrynn Humble, MD, Thelma Comp [phone]) on 02/25/2016 4:18:02 PM       MDM  Patient with multiple medical problems, recent transfusion earlier this month secondary to anemia with episode of hypotension and bradycardia presents today for similar symptoms. Originally reported to be hypotensive at nursing facility at 70s/40s. She reports associated nausea that is secondary to her oral iron supplementation. She denies chest pain or shortness of breath, numbness or weakness dark or bloody stool. She reports her baseline hemoglobin is roughly 8, i-STAT shows hemoglobin of 7. Pending further screening labs. Lactic acid is elevated at 2.26, initiated fluid bolus. Pending CBC for evaluation of current hemoglobin.  If lower than baseline, patient will require transfusion. If Hgb baseline, Patient would benefit from medical admission for observation to ensure hemodynamic stability. Patient care signed out to oncoming provider for follow-up on labs and subsequent medical admission. Prior to patient sign out, I discussed and reviewed this case with Dr.Pickering    Final diagnoses:  Leukocytosis  Bradycardia  Hypotension, unspecified hypotension type  Left arm swelling  Lower back pain  Lower back pain        Comer Locket, PA-C 02/28/16 0604  Julianne Rice, MD 03/11/16 [phone]

## 2016-02-23 NOTE — ED Provider Notes (Signed)
Emergency Ultrasound:  US Guidance for needle guidance  Performed by Dr. Liston Alba Indication: Nursing unable to place IV  Linear probe used in real-time to visualize location of needle entry through skin. Interpretation: Needle placed successfully into R AC.   Geronimo Boot, MD 02/23/16 1941  Geronimo Boot, MD 02/23/16 2154   Davonna Belling, MD 02/29/16 [phone]

## 2016-02-23 NOTE — ED Notes (Signed)
IV team at bedside 

## 2016-02-24 ENCOUNTER — Observation Stay (HOSPITAL_COMMUNITY): Payer: Medicare Other

## 2016-02-24 ENCOUNTER — Encounter (HOSPITAL_COMMUNITY): Payer: Medicare Other

## 2016-02-24 DIAGNOSIS — J90 Pleural effusion, not elsewhere classified: Secondary | ICD-10-CM

## 2016-02-24 DIAGNOSIS — D72829 Elevated white blood cell count, unspecified: Secondary | ICD-10-CM

## 2016-02-24 DIAGNOSIS — I959 Hypotension, unspecified: Secondary | ICD-10-CM

## 2016-02-24 DIAGNOSIS — Z7952 Long term (current) use of systemic steroids: Secondary | ICD-10-CM

## 2016-02-24 DIAGNOSIS — I9589 Other hypotension: Secondary | ICD-10-CM | POA: Diagnosis not present

## 2016-02-24 DIAGNOSIS — R001 Bradycardia, unspecified: Secondary | ICD-10-CM

## 2016-02-24 DIAGNOSIS — E119 Type 2 diabetes mellitus without complications: Secondary | ICD-10-CM

## 2016-02-24 DIAGNOSIS — I480 Paroxysmal atrial fibrillation: Principal | ICD-10-CM

## 2016-02-24 DIAGNOSIS — R400 Somnolence: Secondary | ICD-10-CM

## 2016-02-24 DIAGNOSIS — I5032 Chronic diastolic (congestive) heart failure: Secondary | ICD-10-CM | POA: Diagnosis not present

## 2016-02-24 DIAGNOSIS — G8929 Other chronic pain: Secondary | ICD-10-CM

## 2016-02-24 DIAGNOSIS — Z794 Long term (current) use of insulin: Secondary | ICD-10-CM

## 2016-02-24 DIAGNOSIS — I4891 Unspecified atrial fibrillation: Secondary | ICD-10-CM

## 2016-02-24 DIAGNOSIS — Z79891 Long term (current) use of opiate analgesic: Secondary | ICD-10-CM

## 2016-02-24 LAB — CBC
HEMATOCRIT: 25.5 % — AB (ref 36.0–46.0)
HEMOGLOBIN: 8 g/dL — AB (ref 12.0–15.0)
MCH: 27.9 pg (ref 26.0–34.0)
MCHC: 31.4 g/dL (ref 30.0–36.0)
MCV: 88.9 fL (ref 78.0–100.0)
Platelets: 160 10*3/uL (ref 150–400)
RBC: 2.87 MIL/uL — AB (ref 3.87–5.11)
RDW: 20.2 % — ABNORMAL HIGH (ref 11.5–15.5)
WBC: 19.1 10*3/uL — ABNORMAL HIGH (ref 4.0–10.5)

## 2016-02-24 LAB — GLUCOSE, CAPILLARY
GLUCOSE-CAPILLARY: 190 mg/dL — AB (ref 65–99)
GLUCOSE-CAPILLARY: 81 mg/dL (ref 65–99)
Glucose-Capillary: 109 mg/dL — ABNORMAL HIGH (ref 65–99)

## 2016-02-24 LAB — CBC WITH DIFFERENTIAL/PLATELET
BASOS ABS: 0 10*3/uL (ref 0.0–0.1)
BASOS PCT: 0 %
BASOS PCT: 0 %
Basophils Absolute: 0 10*3/uL (ref 0.0–0.1)
EOS ABS: 0 10*3/uL (ref 0.0–0.7)
EOS PCT: 0 %
Eosinophils Absolute: 0 10*3/uL (ref 0.0–0.7)
Eosinophils Relative: 0 %
HCT: 27.5 % — ABNORMAL LOW (ref 36.0–46.0)
HEMATOCRIT: 27 % — AB (ref 36.0–46.0)
Hemoglobin: 8.5 g/dL — ABNORMAL LOW (ref 12.0–15.0)
Hemoglobin: 8.6 g/dL — ABNORMAL LOW (ref 12.0–15.0)
LYMPHS ABS: 1.7 10*3/uL (ref 0.7–4.0)
LYMPHS ABS: 2 10*3/uL (ref 0.7–4.0)
Lymphocytes Relative: 7 %
Lymphocytes Relative: 6 %
MCH: 27.4 pg (ref 26.0–34.0)
MCH: 27.9 pg (ref 26.0–34.0)
MCHC: 31.3 g/dL (ref 30.0–36.0)
MCHC: 31.5 g/dL (ref 30.0–36.0)
MCV: 87.6 fL (ref 78.0–100.0)
MCV: 88.5 fL (ref 78.0–100.0)
MONO ABS: 1.7 10*3/uL — AB (ref 0.1–1.0)
MONOS PCT: 5 %
Monocytes Absolute: 1.4 10*3/uL — ABNORMAL HIGH (ref 0.1–1.0)
Monocytes Relative: 6 %
NEUTROS ABS: 24.6 10*3/uL — AB (ref 1.7–7.7)
NEUTROS ABS: 24.9 10*3/uL — AB (ref 1.7–7.7)
Neutrophils Relative %: 88 %
Neutrophils Relative %: 88 %
PLATELETS: 205 10*3/uL (ref 150–400)
Platelets: 203 10*3/uL (ref 150–400)
RBC: 3.05 MIL/uL — ABNORMAL LOW (ref 3.87–5.11)
RBC: 3.14 MIL/uL — ABNORMAL LOW (ref 3.87–5.11)
RDW: 20 % — ABNORMAL HIGH (ref 11.5–15.5)
RDW: 19.9 % — AB (ref 11.5–15.5)
Smear Review: ADEQUATE
Smear Review: ADEQUATE
WBC: 28.3 10*3/uL — ABNORMAL HIGH (ref 4.0–10.5)
WBC: 28 10*3/uL — ABNORMAL HIGH (ref 4.0–10.5)

## 2016-02-24 LAB — BLOOD GAS, ARTERIAL
Acid-Base Excess: 3.3 mmol/L — ABNORMAL HIGH (ref 0.0–2.0)
BICARBONATE: 27.6 meq/L — AB (ref 20.0–24.0)
Drawn by: 275531
FIO2: 0.21
O2 SAT: 90.2 %
PATIENT TEMPERATURE: 98.6
PH ART: 7.411 (ref 7.350–7.450)
TCO2: 29 mmol/L (ref 0–100)
pCO2 arterial: 44.3 mmHg (ref 35.0–45.0)
pO2, Arterial: 59.7 mmHg — ABNORMAL LOW (ref 80.0–100.0)

## 2016-02-24 LAB — LACTIC ACID, PLASMA: LACTIC ACID, VENOUS: 1.5 mmol/L (ref 0.5–1.9)

## 2016-02-24 LAB — SAVE SMEAR

## 2016-02-24 MED ORDER — POLYVINYL ALCOHOL 1.4 % OP SOLN: 1.0000 [drp] | Freq: Every day | OPHTHALMIC | Status: DC | PRN

## 2016-02-24 MED ORDER — ONDANSETRON HCL 4 MG PO TABS
8.0000 mg | ORAL_TABLET | Freq: Three times a day (TID) | ORAL | Status: DC | PRN
  Administered 2016-02-25 – 2016-02-27 (×3): 8 mg via ORAL
  Filled 2016-02-24 (×3): qty 2

## 2016-02-24 MED ORDER — POLYETHYLENE GLYCOL 3350 17 G PO PACK: 17.0000 g | PACK | Freq: Every day | ORAL | Status: DC | PRN

## 2016-02-24 MED ORDER — OXYCODONE HCL ER 15 MG PO T12A: 30.0000 mg | EXTENDED_RELEASE_TABLET | Freq: Two times a day (BID) | ORAL | Status: DC

## 2016-02-24 MED ORDER — ALENDRONATE SODIUM 70 MG PO TABS: 70.0000 mg | ORAL_TABLET | ORAL | Status: DC

## 2016-02-24 MED ORDER — CYANOCOBALAMIN 1000 MCG/ML IJ SOLN
1000.0000 ug | INTRAMUSCULAR | Status: DC
  Administered 2016-02-27: 1000 ug via INTRAMUSCULAR
  Filled 2016-02-24: qty 1

## 2016-02-24 MED ORDER — FUROSEMIDE 40 MG PO TABS
40.0000 mg | ORAL_TABLET | Freq: Every day | ORAL | Status: DC
  Administered 2016-02-24 – 2016-02-28 (×5): 40 mg via ORAL
  Filled 2016-02-24 (×5): qty 1

## 2016-02-24 MED ORDER — VITAMIN B-12 1000 MCG PO TABS
1000.0000 ug | ORAL_TABLET | Freq: Every evening | ORAL | Status: DC
  Administered 2016-02-24 – 2016-02-28 (×4): 1000 ug via ORAL
  Filled 2016-02-24 (×6): qty 1

## 2016-02-24 MED ORDER — FLUTICASONE PROPIONATE 50 MCG/ACT NA SUSP
1.0000 | Freq: Every day | NASAL | Status: DC
  Administered 2016-02-24 – 2016-02-28 (×4): 1 via NASAL
  Filled 2016-02-24: qty 16

## 2016-02-24 MED ORDER — PRAVASTATIN SODIUM 40 MG PO TABS
80.0000 mg | ORAL_TABLET | Freq: Every evening | ORAL | Status: DC
Start: 2016-02-24 — End: 2016-02-29
  Administered 2016-02-24 – 2016-02-28 (×5): 80 mg via ORAL
  Filled 2016-02-24 (×5): qty 2

## 2016-02-24 MED ORDER — SENNA 8.6 MG PO TABS: 1.0000 | ORAL_TABLET | Freq: Every evening | ORAL | Status: DC | PRN

## 2016-02-24 MED ORDER — VITAMIN C 500 MG PO TABS
1000.0000 mg | ORAL_TABLET | Freq: Two times a day (BID) | ORAL | Status: DC
  Administered 2016-02-24 – 2016-02-25 (×3): 1000 mg via ORAL
  Filled 2016-02-24 (×6): qty 2

## 2016-02-24 MED ORDER — INSULIN ASPART 100 UNIT/ML ~~LOC~~ SOLN
0.0000 [IU] | Freq: Three times a day (TID) | SUBCUTANEOUS | Status: DC
  Administered 2016-02-24 (×2): 3 [IU] via SUBCUTANEOUS

## 2016-02-24 MED ORDER — APIXABAN 2.5 MG PO TABS
2.5000 mg | ORAL_TABLET | Freq: Two times a day (BID) | ORAL | Status: DC
Start: 2016-02-24 — End: 2016-02-29
  Administered 2016-02-24 – 2016-02-29 (×12): 2.5 mg via ORAL
  Filled 2016-02-24 (×12): qty 1

## 2016-02-24 MED ORDER — INSULIN ASPART 100 UNIT/ML ~~LOC~~ SOLN: 0.0000 [IU] | Freq: Every day | SUBCUTANEOUS | Status: DC

## 2016-02-24 MED ORDER — ACETAMINOPHEN 325 MG PO TABS
650.0000 mg | ORAL_TABLET | Freq: Four times a day (QID) | ORAL | Status: DC | PRN
  Administered 2016-02-24 – 2016-02-26 (×3): 650 mg via ORAL
  Filled 2016-02-24 (×3): qty 2

## 2016-02-24 MED ORDER — PREDNISONE 20 MG PO TABS
10.0000 mg | ORAL_TABLET | Freq: Every day | ORAL | Status: DC
  Administered 2016-02-24 – 2016-02-29 (×6): 10 mg via ORAL
  Filled 2016-02-24 (×6): qty 1

## 2016-02-24 MED ORDER — PANTOPRAZOLE SODIUM 40 MG PO TBEC
40.0000 mg | DELAYED_RELEASE_TABLET | Freq: Every day | ORAL | Status: DC
  Administered 2016-02-24 – 2016-02-29 (×6): 40 mg via ORAL
  Filled 2016-02-24 (×6): qty 1

## 2016-02-24 NOTE — Consult Note (Signed)
[facility] Nurse wound consult note Reason for Consult: sacrum and heels Wound type: Stage 1 Pressure Injuries bilateral heels Stage 2 Pressure Injury sacrum Pressure Ulcer POA: Yes Measurement:  Sacrum: 1cmx 0.5cm x 0.2cm: 100% yellow Bilateral heels red, does not blanch, painful aprox 4cm x 4cm x 0 (each heel) Wound bed:see above Drainage (amount, consistency, odor) minimal from the sacrum, serous, no odor Periwound: intact, some redness on the sacrum, but blanches Dressing procedure/placement/frequency: Continue foam dressing to the sacrum, turn and reposition. Add chair pressure redistribution pad for when up in the chair and can be taken with patient for use at DC Soft silicone foam dressings to the bilateral heels, change every 3 days.  Add Prevalon boots for pressure redistribution.  Discussed POC with patient and bedside nurse.  Re consult if needed, will not follow at this time. Thanks  Anderia Lorenzo [employer], RN,CNS, [place] [phone])

## 2016-02-24 NOTE — ED Notes (Signed)
Son, Doreatha Lew called and informed of room assignment.

## 2016-02-24 NOTE — Progress Notes (Signed)

## 2016-02-24 NOTE — Care Management Obs Status (Signed)
[place] NOTIFICATION   Patient Details  Name: Cynthia Hicks MRN: [id number] Date of Birth: 04-13-1933   Medicare Observation Status Notification Given:  Yes    Dawayne Patricia, RN 02/24/2016, 3:58 PM

## 2016-02-24 NOTE — H&P (Signed)
Date: 02/24/2016               Patient Name:  Cynthia Hicks MRN: [id number]  DOB: [birth year] Age / Sex: 82 y.o., female   PCP: Raelyn Number, MD         Medical Service: Internal Medicine Teaching Service         Attending Physician: Dr. [name], MD    First Contact: Dr. Wynetta Emery Pager: [phone]  Second Contact: Dr. Randell Patient Pager: [phone]       After Hours (After 5p/  First Contact Pager: [phone]  weekends / holidays): Second Contact Pager: [phone]   Chief Complaint: Hypotension and bradycardia  History of Present Illness: Cynthia Hicks is a chronically ill 82 year old woman with many multisystemic diseases who was sent to the ED from her SNF for hypotension and bradycardia.  Her medical history includes CAD (s/p remote stents), paroxysmal atrial fibrillation (on apixaban), DM, HTN (on bisoprolol), diastolic HF, CKD, autoimmune hepatitis (on chronic prednisone), and anemia of chronic disease (require transfusion).   History supplemented by chart review due to patient somnolence and lack of participation.  Cynthia. Hicks was sent to the ED from her SNF due to an episode of hypotension (SBP 70s) and bradcardia (HR 40s).  Yesterday, she was nauseous, didn't eat, and had 2 episodes of non-bloody emesis.  In the ED, she received 2L NS for hypotension and her BPs increased to 100s/40s.  She has had multiple recent admissions for bradycardia/hypotension (7/4-02/09/16), failure to thrive (6/22-6/23/17), pseudomonal septic arthritis of bilateral knees (6/2-01/10/16),  And cellulitis (5/17, [facility]).  After her last admission with symptomatic bradycardia and hypotension, dofetilide and losartan were discontinued.  Meds: No current facility-administered medications for this encounter.   Current Outpatient Prescriptions  Medication Sig Dispense Refill  . acetaminophen (TYLENOL) 325 MG tablet Take 2 tablets (650 mg total) by mouth every 6 (six) hours as needed for mild pain (or Fever  >/= 101). 30 tablet 3  . alendronate (FOSAMAX) 70 MG tablet Take 70 mg by mouth every Monday. Take with a full glass of water on an empty stomach.    Marland Kitchen apixaban (ELIQUIS) 2.5 MG TABS tablet Take 1 tablet (2.5 mg total) by mouth 2 (two) times daily. 180 tablet 3  . Ascorbic Acid (VITAMIN C) 1000 MG tablet Take 1,000 mg by mouth 2 (two) times daily.     . bisoprolol (ZEBETA) 5 MG tablet Take 1 tablet (5 mg total) by mouth daily. 90 tablet 3  . Cholecalciferol (VITAMIN D3) 2000 units TABS Take 1 tablet by mouth at bedtime.    . clotrimazole (MYCELEX) 10 MG troche Take 10 mg by mouth 5 (five) times daily.    . Ferrous Gluconate 256 (28 Fe) MG TABS Take 1 tablet by mouth 2 (two) times daily.    . fluticasone (FLONASE) 50 MCG/ACT nasal spray Place 1 spray into both nostrils at bedtime.    . furosemide (LASIX) 20 MG tablet Take 20 mg by mouth every evening.    . furosemide (LASIX) 40 MG tablet Take 40 mg by mouth every morning.    . insulin regular (HUMULIN R) 100 units/mL injection Inject 4-16 Units into the skin 3 (three) times daily before meals. Per sliding scale: BGL 120-200 = 4 units; 201-250 = 6 units; 251-300 = 8 units; 301-350 = 10 units; 351-400 = 14 units; 401-450 = 16 units    . levofloxacin (LEVAQUIN) 750 MG tablet Take 750 mg by  mouth every other day.    . Multiple Vitamin (MULTIVITAMIN) tablet Take 1 tablet by mouth daily.    . nitroGLYCERIN (NITROSTAT) 0.4 MG SL tablet Place 1 tablet (0.4 mg total) under the tongue every 5 (five) minutes as needed for chest pain. 25 tablet 3  . omeprazole (PRILOSEC) 20 MG capsule Take 20 mg by mouth daily.     . ondansetron (ZOFRAN) 8 MG tablet Take 8 mg by mouth every 8 (eight) hours as needed for nausea or vomiting.    Marland Kitchen oxyCODONE 30 MG 12 hr tablet Take 30 mg by mouth every 12 (twelve) hours. FOR PAIN    . polyethylene glycol (MIRALAX / GLYCOLAX) packet Take 17 g by mouth daily as needed for mild constipation or moderate constipation. 14 each 0  .  polyvinyl alcohol (LIQUIFILM TEARS) 1.4 % ophthalmic solution Place 1 drop into both eyes daily as needed for dry eyes.    . pravastatin (PRAVACHOL) 80 MG tablet Take 1 tablet (80 mg total) by mouth every evening. 90 tablet 3  . predniSONE (DELTASONE) 10 MG tablet Take 10 mg by mouth daily.     Marland Kitchen senna (SENOKOT) 8.6 MG TABS tablet Take 1 tablet (8.6 mg total) by mouth at bedtime as needed for moderate constipation. (Patient taking differently: Take 1 tablet by mouth at bedtime as needed for mild constipation or moderate constipation. ) 120 each 0  . vitamin B-12 (CYANOCOBALAMIN) 1000 MCG tablet Take 1,000 mcg by mouth every evening.    . collagenase (SANTYL) ointment Apply topically daily. 15 g 0  . cyanocobalamin (,VITAMIN B-12,) 1000 MCG/ML injection Inject 1,000 mcg into the muscle once a week.    . Insulin Regular Human (RELION R IJ) Inject 4-16 Units as directed 3 (three) times daily. BS 120-200=4 units, 201-250=6 units, 251-300=8 units, 301-350=10 units, 351-400=14 units, 401-450=16 units.    Marland Kitchen morphine (Cynthia CONTIN) 60 MG 12 hr tablet Take 1 tablet (60 mg total) by mouth every 8 (eight) hours. (Patient not taking: Reported on 02/23/2016) 90 tablet 0    Allergies: Allergies as of 02/23/2016 - Review Complete 02/23/2016  Allergen Reaction Noted  . Cyclosporine  06/18/2014  . Azathioprine Other (See Comments) 03/03/2014  . Mycophenolate Other (See Comments) 06/23/2014  . Other  03/03/2014  . Shellfish allergy Nausea And Vomiting 10/19/2014   Past Medical History  Diagnosis Date  . Hypertension   . Arthritis   . Diabetes mellitus   . Coronary artery disease     a.  MI 01/2003;  b.  02/2006 NSTEMI - LAD 80-35m, LCX nl, RCA 15m - RCA stented w/ Taxus DES;   c.  2009 Neg MV;  d. 11/2010 Echo - EF 65%;  e. 02/2013 MV: Carlton Adam Cardiolite that was low risk with no ischemia.  . Autoimmune liver disease     a.  Followed @ [facility] - on chronic prednisone and  sirolimus  . Hyperlipidemia     a.  inability to stake statins due to liver disease  . Cellulitis     a. chronic cellulitis of LE  . CKD (chronic kidney disease), stage IV ([place])   . Bulging disc   . Sciatica   . Chronic back pain     a.  On Methadone  . Skin cancer (melanoma) ([place])   . Esophageal stricture     narrowing  . Diabetic neuropathy ([place])   . Sepsis ([place])   . PAF (paroxysmal atrial fibrillation) ([place])     a. Tikosyn  initiated 10/2014;  CHA2DS2VASc = 6 (eliquis).  . Chronic diastolic CHF (congestive heart failure) ([place])     a. 06/2014 TEE: EF 60-65%, Gr 2 DD, mild MR, mildly dil LA, Grade IV descending thoracic Ao plaque.  Marland Kitchen PAD (peripheral artery disease) ([place])   . Chronic acquired lymphedema   . Anemia 02/07/2016    REQUIRING A BLOOD TRANSFUSION    Family History: Could not obtain due to patient's reduced level of consciousness  Social History: Could not obtain due to patient's reduced level of consciousness  Review of Systems: Could not obtain due to patient's reduced level of consciousness  Physical Exam: Blood pressure 99/47, pulse 91, temperature 97.6 F (36.4 C), temperature source Oral, resp. rate 37, height 5\' 1"  (1.549 m), SpO2 97 %.   Physical Exam  Constitutional:  Elderly woman, leaning to the side in her stretcher sleeping.  Somnolent, waking to voice or touch but falling asleep almost immediately without vigorous stimulation.  Moaning in pain when examining her legs.  HENT:  Head: Normocephalic and atraumatic.  Eyes: Conjunctivae are normal. No scleral icterus.  Neck: No tracheal deviation present.  Cardiovascular: Normal rate, regular rhythm, normal heart sounds and intact distal pulses.   Pulmonary/Chest:  Crackles in bilateral bases, no increased work of breathing  Abdominal: Soft. She exhibits no distension. There is no tenderness.  Lymphadenopathy:    She has no cervical adenopathy.  Neurological:  Exam limited by somnolence.  Responds to loud voice or touch with groan or  mumbled words.  Skin:  Bilateral legs diffusely hot erythematous below the knee R>L, no induration or lymphatic streaking.  ~1cm healing ulcer on L heel with eschar. Skin moist, sheets damp. Large ecchymoses completely covering bilateral forearms, hands, and on upper chest  Skin atrophic.  Hands cold. Sacral ulcer covered in clean bandage.  Psychiatric:  Exam limited by somnolence    EKG: NSR, low amplitudes, QRS 117ms, Q wave in AVL, T wave flattening  CXR: Bilateral pleural effusions, atelectasis, new since 01/19/16  CBC Latest Ref Rng 02/24/2016 02/23/2016 02/09/2016  WBC 4.0 - 10.5 K/uL 28.3(H) - 5.4  Hemoglobin 12.0 - 15.0 g/dL 8.5(L) 7.1(L) 10.2(L)  Hematocrit 36.0 - 46.0 % 27.0(L) 21.0(L) 32.0(L)  Platelets 150 - 400 K/uL 203 - 138(L)   CMP Latest Ref Rng 02/23/2016 02/23/2016 02/09/2016  Glucose 65 - 99 mg/dL 187(H) 188(H) 81  BUN 6 - 20 mg/dL 18 23(H) 29(H)  Creatinine 0.44 - 1.00 mg/dL 1.26(H) 1.10(H) 1.34(H)  Sodium 135 - 145 mmol/L 139 139 139  Potassium 3.5 - 5.1 mmol/L 4.8 4.7 4.3  Chloride 101 - 111 mmol/L 102 97(L) 104  CO2 22 - 32 mmol/L 30 - 28  Calcium 8.9 - 10.3 mg/dL 8.6(L) - 7.6(L)  Total Protein 6.5 - 8.1 g/dL 5.1(L) - -  Total Bilirubin 0.3 - 1.2 mg/dL 0.5 - -  Alkaline Phos 38 - 126 U/L 57 - -  AST 15 - 41 U/L 18 - -  ALT 14 - 54 U/L 10(L) - -    Lactic Acid, Venous    Component Value Date/Time   LATICACIDVEN 2.26* 02/23/2016 1805   Troponin (Point of Care Test)  Recent Labs  02/23/16 1803  TROPIPOC 0.01   Urinalysis    Component Value Date/Time   COLORURINE YELLOW 02/23/2016 [place] 02/23/2016 1556   LABSPEC 1.011 02/23/2016 1556   PHURINE 7.5 02/23/2016 1556   GLUCOSEU NEGATIVE 02/23/2016 1556   GLUCOSEU NEG mg/dL 07/05/2009 2025  HGBUR NEGATIVE 02/23/2016 1556   BILIRUBINUR NEGATIVE 02/23/2016 1556   KETONESUR NEGATIVE 02/23/2016 1556   PROTEINUR NEGATIVE 02/23/2016 1556   UROBILINOGEN 0.2 06/08/2015 1612   NITRITE  NEGATIVE 02/23/2016 1556   LEUKOCYTESUR NEGATIVE 02/23/2016 1556   FOBT: Negative  Echo (02/08/16): Normal systolic function, grade 2 diastolic dysfunction  Assessment & Plan by Problem: Principal Problem:   Hypotension Active Problems:   Anemia of chronic disease   Autoimmune hepatitis (HCC)   Chronic diastolic CHF (congestive heart failure) (HCC)   CKD (chronic kidney disease), stage III   Pressure ulcer   PAF (paroxysmal atrial fibrillation) (HCC)   Bradycardia   Leukocytosis  Cynthia Hicks is a chronically ill 82 year old woman with many polysystemic diseases.  As evidenced by her frequent hospitalizations, she likely has minimal physiologic reserve and decompensates easily.  Additionally, she is immunosuppressed due to chronic steroid use, illness, and likely malnutrition, and has had multiple recent infections.  Thus the situation surrounding her presentation today is likely multifactorial.  Ruling out new infection as cause of her decompensation and optimizing cardiovascular status will be goals of admission.  #Hypotension #Bradycardia #Paroxysmal Atrial Fibrillation #CHF Recent vomiting and poor PO intact could add orthostasis as an exacerbating factor.  Likely volume depleted, since hypotensive and BP responded to IVF.  However, volume status is somewhat ambiguous with new pleural effusions, chronic LE edema, and creatinine near baseline.  Could represent worsening cardiac function. -Telemetry -Hold beta blocker, lasix -Consider repeat echo  #Leukocytosis Several blood draws attempted due to coagulation, WBC could be spurious.  If infected sources could include cellulitis or pneumonia.  Due to age and chronic illness, may not mount a robust febrile response to infection.  -Blood cultures -Repeat CBC -Trend lactic acid -Start broad-spectrum ABx if febrile, BPs drop, or white count stays high  #Pleural Effusion Large, bilateral, new in past month.  Possible etiologies include  hypoalbuminemia, atelactasis, CHF, and PNA.  Thoracentesis could be considered even on DOAC if further diagnostic information required. -Repeat CXR before discharge or sooner if symptoms change  #Anemia of Chronic Disease Hgb 8.5 appears to be near baseline. -Repeat CBC  #Autoimmune Hepatitis Normal coags suggest intact hepatocellular function, though low albumin.  No transaminitis or cholestatic pattern. -Continue home prednisone  #DM -SSI  #Chronic Pain -Continue home oxycontin  Discuss code status in AM Consider palliative care consult for chronic illness  Dispo: Admit patient to Observation with expected length of stay less than 2 midnights.  Signed: Minus Liberty, MD 02/24/2016, 1:16 AM  Pager: [phone]

## 2016-02-24 NOTE — ED Notes (Signed)
MD at bedside. 

## 2016-02-24 NOTE — ED Notes (Signed)
Pt assisted with toileting (bedpan)

## 2016-02-24 NOTE — Consult Note (Signed)
CARDIOLOGY CONSULT NOTE       Patient ID: Cynthia Hicks MRN: [id number] DOB/AGE: 1933/01/13 82 y.o.  Admit date: 02/23/2016 Referring Physician:  Dareen Piano Primary Physician: Bonnita Nasuti, MD Primary Cardiologist:  Elnora Morrison Reason for Consultation:  PAF  Principal Problem:   Hypotension Active Problems:   Anemia of chronic disease   Autoimmune hepatitis ([place])   Chronic diastolic CHF (congestive heart failure) (HCC)   CKD (chronic kidney disease), stage III   Pressure ulcer   PAF (paroxysmal atrial fibrillation) (HCC)   Bradycardia   Leukocytosis   HPI:   Unfortunate 82 y.o. Cynthia Hicks Kitchen female with a history of PAF on Tikosyn and Eliquis, chronic diastolic CHF, lymphedema, chronic venous insufficiency on venous pump, chronic cellulitis, dysphagia requring esophageal dilation, CKD, PAD, CAD s/p DESx2 to RCA (2007); DES to CFX (2013) and autoimmune hepatitis on chronic steroids who presented to the [facility]  With weakness and ? Of Recurrent sepsis.  Telemetry thought to show PAF. To my review mostly NSR with frequent PAC;s and PVC;s. She has no cardiac complaints no dyspnea, chest pain syncope or palpitations Currently some nausea and no appetite for lunch .  UA negative WBC elevated.  CXR with pleural effusions and atelectasis    ROS All other systems reviewed and negative except as noted above  Past Medical History  Diagnosis Date  . Hypertension   . Arthritis   . Diabetes mellitus   . Coronary artery disease     a.  MI 01/2003;  b.  02/2006 NSTEMI - LAD 80-19m, LCX nl, RCA 5m - RCA stented w/ Taxus DES;   c.  2009 Neg MV;  d. 11/2010 Echo - EF 65%;  e. 02/2013 MV: Carlton Adam Cardiolite that was low risk with no ischemia.  . Autoimmune liver disease     a.  Followed @ [facility] - on chronic prednisone and  sirolimus  . Hyperlipidemia     a. inability to stake statins due to liver disease  . Cellulitis     a. chronic cellulitis of LE  . CKD (chronic kidney disease), stage IV ([place])   .  Bulging disc   . Sciatica   . Chronic back pain     a.  On Methadone  . Skin cancer (melanoma) ([place])   . Esophageal stricture     narrowing  . Diabetic neuropathy ([place])   . Sepsis ([place])   . PAF (paroxysmal atrial fibrillation) ([place])     a. Tikosyn initiated 10/2014;  CHA2DS2VASc = 6 (eliquis).  . Chronic diastolic CHF (congestive heart failure) ([place])     a. 06/2014 TEE: EF 60-65%, Gr 2 DD, mild MR, mildly dil LA, Grade IV descending thoracic Ao plaque.  Cynthia Hicks Kitchen PAD (peripheral artery disease) ([place])   . Chronic acquired lymphedema   . Anemia 02/07/2016    REQUIRING A BLOOD TRANSFUSION    Family History  Problem Relation Age of Onset  . Lung cancer Mother 66  . Heart attack Father 74  . Heart disease Father   . Congestive Heart Failure Father   . Heart disease Maternal Grandfather   . Hypertension Brother   . Hypertension Son   . Pancreatic cancer Mother     Social History   Social History  . Marital Status: Married    Spouse Name: Cynthia Hicks  . Number of Children: 2  . Years of Education: N/A   Occupational History  . 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 Smoking status: Never Smoker   . Smokeless tobacco: Never Used  . Alcohol Use: No  . Drug Use: No  . Sexual Activity: Not on file   Other Topics Concern  . Not on file   Social History Narrative   Pt lives in [place] with husband.  She is retired.  She is not very active @ home.    Past Surgical History  Procedure Laterality Date  . Cardiac catheterization      Normal EF  . Coronary angioplasty  2007    2 DES placement to RCA complicated by a wire perforation  . Tubal ligation  1982  . Elbow surgery Left 1994    neuroma removed   . Abscess drainage Right     side  . Lamenectomy  1989    only 1 disc surgery  . Heart stents  2007  and  2012    X's 1  and  2012 X's 2 stents  . Cardioversion N/A 10/31/2012    Procedure: CARDIOVERSION;  Surgeon: Larey Dresser, MD;  Location: [facility] ENDOSCOPY;   Service: Cardiovascular;  Laterality: N/A;  . Tee without cardioversion N/A 06/22/2014    Procedure: TRANSESOPHAGEAL ECHOCARDIOGRAM (TEE);  Surgeon: Larey Dresser, MD;  Location: [facility] ENDOSCOPY;  Service: Cardiovascular;  Laterality: N/A;  . Left heart catheterization with coronary angiogram N/A 08/29/2011    Procedure: LEFT HEART CATHETERIZATION WITH CORONARY ANGIOGRAM;  Surgeon: Wellington Hampshire, MD;  Location: [place] CATH LAB;  Service: Cardiovascular;  Laterality: N/A;  . Percutaneous coronary stent intervention (pci-s)  08/29/2011    Procedure: PERCUTANEOUS CORONARY STENT INTERVENTION (PCI-S);  Surgeon: Wellington Hampshire, MD;  Location: [facility] CATH LAB;  Service: Cardiovascular;;  . Knee arthroscopy Bilateral 01/08/2016    Procedure: ARTHROSCOPY KNEE;  Surgeon: Newt Minion, MD;  Location: [place];  Service: Orthopedics;  Laterality: Bilateral;     . apixaban  2.5 mg Oral BID  . [START ON 02/27/2016] cyanocobalamin  1,000 mcg Intramuscular Weekly  . fluticasone  1 spray Each Nare QHS  . insulin aspart  0-15 Units Subcutaneous TID WC  . insulin aspart  0-5 Units Subcutaneous QHS  . pantoprazole  40 mg Oral Daily  . pravastatin  80 mg Oral QPM  . predniSONE  10 mg Oral Q breakfast  . vitamin B-12  1,000 mcg Oral QPM  . vitamin C  1,000 mg Oral BID      Physical Exam: Blood pressure 116/50, pulse 97, temperature 98.2 F (36.8 C), temperature source Oral, resp. rate 20, height 5\' 1"  (1.549 m), weight 110 lb 9.6 oz (50.168 kg), SpO2 96 %.    Confused Chronically ill white female  HEENT: normal Neck supple with no adenopathy JVP normal no bruits no thyromegaly Lungs bibasilar crackles  no wheezing and good diaphragmatic motion Heart:  S1/S2 no murmur, no rub, gallop or click PMI normal Abdomen: benighn, BS positve, no tenderness, no AAA  Sacral decubitus with dressing  no bruit.  No HSM or HJR Distal pulses intact with no bruits Plus 3 bilateral lymphedema below knees with heel ulcers and  dressings  Neuro non-focal Skin warm and dry No muscular weakness   Labs:   Lab Results  Component Value Date   WBC 28.0* 02/24/2016   HGB 8.6* 02/24/2016   HCT 27.5* 02/24/2016   MCV 87.6 02/24/2016   PLT 205 02/24/2016    Recent Labs Lab 02/23/16 1824  NA 139  K 4.8  CL 102  CO2 30  BUN 18  CREATININE 1.26*  CALCIUM 8.6*  PROT 5.1*  BILITOT 0.5  ALKPHOS 57  ALT 10*  AST 18  GLUCOSE 187*   Lab Results  Component Value Date   CKTOTAL 57 08/29/2011   CKMB 6.2* 08/29/2011   TROPONINI 0.03* 02/08/2016    Lab Results  Component Value Date   CHOL 170 10/11/2014   CHOL 202* 05/27/2012   CHOL 378 08/04/2009   Lab Results  Component Value Date   HDL 73.00 10/11/2014   HDL 87.10 05/27/2012   HDL 71.06 08/04/2009   Lab Results  Component Value Date   LDLCALC 192 08/04/2009   Lab Results  Component Value Date   TRIG 206.0* 10/11/2014   TRIG 134.0 05/27/2012   TRIG 781 08/04/2009   Lab Results  Component Value Date   CHOLHDL 2 10/11/2014   CHOLHDL 2 05/27/2012   CHOLHDL 4 01/18/2009   Lab Results  Component Value Date   LDLDIRECT 73.0 10/11/2014   LDLDIRECT 99.8 05/27/2012   LDLDIRECT 187.4 01/18/2009      Radiology: Dg Chest 2 View  02/24/2016  CLINICAL DATA:  Weakness and shortness breath. EXAM: CHEST  2 VIEW COMPARISON:  January 19, 2016 FINDINGS: No pneumothorax. Bilateral pleural effusions, right greater than left, with underlying atelectasis. The cardiomediastinal silhouette is stable. No pulmonary nodules, masses, or focal infiltrates. IMPRESSION: Bilateral pleural effusions with underlying atelectasis, new in the interval. Recommend follow-up to resolution. Electronically Signed   By: Dorise Bullion III M.D   On: 02/24/2016 02:18   Ct Abdomen Pelvis W Contrast  01/26/2016  CLINICAL DATA:  Right lower quadrant pain EXAM: CT ABDOMEN AND PELVIS WITH CONTRAST TECHNIQUE: Multidetector CT imaging of the abdomen and pelvis was performed using the  standard protocol following bolus administration of intravenous contrast. CONTRAST:  53mL ISOVUE-300 IOPAMIDOL (ISOVUE-300) INJECTION 61% COMPARISON:  CT scan 05/02/2015 FINDINGS: Lower chest: There is small right pleural effusion with right base posterior atelectasis. Atherosclerotic calcifications of coronary arteries are noted. Cardiomegaly is noted. Hepatobiliary: Again noted intrahepatic pneumobilia especially in left hepatic lobe. Small amount of air within gallbladder again noted. A gallstone is noted within gallbladder measures 1.3 cm. There is no thickening of gallbladder wall or pericholecystic fluid. CBD measures 1.1 cm in diameter distally. Pancreas: Mild pancreatic atrophy again noted. Spleen: Enhanced spleen is unremarkable. Punctate calcifications within spleen from prior granulomatous disease. Adrenals/Urinary Tract: No adrenal gland mass. Enhanced kidneys are symmetrical in size. No hydronephrosis or hydroureter. No nephrolithiasis. No calcified ureteral calculi. Delayed renal images shows bilateral renal symmetrical excretion. Bilateral visualized proximal ureter is unremarkable. Stomach/Bowel: No gastric outlet obstruction. Moderate stool noted in right colon and cecum. No pericecal inflammation. Normal appendix containing air is noted in axial image 63. There is fecal like material within terminal ileum. Mild fluid and gas distended small bowel loop in distal small bowel in right lower quadrant. Findings suspicious for ileus, incompetent ileocecal valve or early bowel obstruction. Clinical correlation is necessary. Vascular/Lymphatic: Extensive atherosclerotic calcifications of abdominal aorta. Extensive atherosclerotic calcifications of splenic artery, atherosclerotic calcification bilateral proximal renal artery. Extensive atherosclerotic calcifications bilateral common iliac arteries. SMA calcifications are noted. There is no aortic aneurysm. Reproductive: The uterus is atrophic.  No adnexal  mass. Other: No ascites or free abdominal air. The urinary bladder is unremarkable. There is mild anasarca infiltration subcutaneous fat flank wall and pelvic wall. Musculoskeletal: Sagittal images of the spine shows diffuse osteopenia. Again noted significant compression deformity T12 vertebral body. Stable  mild compression fracture lower endplate of T3. Schmorl's node deformity lower endplate of T2 vertebral body. Multilevel disc space flattening with vacuum disc phenomenon. IMPRESSION: 1. There is small right pleural effusion with right base posterior atelectasis. Again noted intrahepatic pneumobilia. Gallstone within gallbladder measures 1.3 cm. No evidence of pericholecystic fluid or thickening of gallbladder wall. 2. Moderate stool noted within right colon and cecum. Normal appendix. No pericecal inflammation. There is fecal like material within terminal ileum. Mild distended small bowel loops in right lower quadrant terminal small bowel please see axial image 69. Findings suspicious for ileus, early bowel obstruction or incompetent ileocecal valve. Clinical correlation is necessary. 3. No ascites or free air. 4. No hydronephrosis or hydroureter. 5. Stable compression fractures of T12 and L3 vertebral body. Extensive degenerative changes lumbar spine again noted. In the 6. There is anasarca infiltration of subcutaneous fat bilateral flank and pelvic wall. Electronically Signed   By: Lahoma Crocker M.D.   On: 01/26/2016 15:50   Dg Hip Unilat With Pelvis 2-3 Views Right  01/26/2016  CLINICAL DATA:  Chronic anterior right hip pain.  Initial encounter. EXAM: DG HIP (WITH OR WITHOUT PELVIS) 2-3V RIGHT COMPARISON:  None. FINDINGS: There is no evidence of fracture or dislocation. Both femoral heads are seated normally within their respective acetabula. The proximal right femur appears intact. Mild degenerative change is noted along the lower lumbar spine. The sacroiliac joints are unremarkable in appearance. The  visualized bowel gas pattern is grossly unremarkable in appearance. Scattered vascular calcifications are seen. IMPRESSION: 1. No evidence of fracture or dislocation. 2. Scattered vascular calcifications seen. Electronically Signed   By: Garald Balding M.D.   On: 01/26/2016 18:21    EKG:  SR LAFB nonspecific ST changes    ASSESSMENT AND PLAN:   PAF:  On low dose eliquis.  ECG with SR and telemetry appears to show mostly SR with PAC;s and PVCls. Asymptomatic Rates not too slow or fast Previously on tikosyn No need for any change in Rx given other medical comorbidities and normal EF by echo 02/08/16    Lymph Edema:  Admitted with dehydration, ? Sepsis and hypotension resume SNF dose lasix in 48 hrs or so.  Wound care consult for LEls and sacrum.  CAD:  Stable no angina troponin negative not a candidate for further invasive evaluation.  Non ischemic myovue August 2016  Agree with palliative care consult   Signed: Jenkins Rouge 02/24/2016, 12:44 PM

## 2016-02-24 NOTE — ED Notes (Signed)
PA aware of HGB and BP.

## 2016-02-24 NOTE — Progress Notes (Signed)
Subjective: [name] is feeling tired and short of breath this morning. She mentions feeling short of breath when lying flat last night but this continues while sitting. She endorses some recent diarrhea (3x daily), fatigue, and nausea/vomiting yesterday morning. She denies dysuria, cough, chest pain or pain elsewhere. She is somewhat somnolent and falls asleep repeatedly during our conversation.  On review of telemetry - several episodes of AFib-RVR overnight.  Objective: Vital signs in last 24 hours: Filed Vitals:   02/24/16 0100 02/24/16 0130 02/24/16 0227 02/24/16 0335  BP: 99/47 106/46 116/50   Pulse: 91 97    Temp:   98.2 F (36.8 C)   TempSrc:   Oral   Resp: 37 28 20   Height:   5\' 1"  (1.549 m)   Weight:   50.168 kg (110 lb 9.6 oz)   SpO2: 97% 100%  96%    Intake/Output Summary (Last 24 hours) at 02/24/16 1015 Last data filed at 02/24/16 0735  Gross per 24 hour  Intake   4120 ml  Output      0 ml  Net   4120 ml   Physical Exam General appearance: Ill-appearing, frail, thin elderly female sitting slumped in bedside chair, very soft spoken HENT: Normocephalic, moon-facies, neck supple, trachea midline Cardiovascular: Irregularly irregular heart beat, no murmurs, rubs, gallops Respiratory/Chest: Biblasilar crackles present, somewhat tachypneic with increased work of breathing Abdomen: Bowel sounds present, soft, mild diffuse tenderness to palpation, non-distended Extremities: 1+ pitting edema of RLE to knee, mildly erythematous and tender to palpation, minimal edema of LLE Skin: Warm, dry, intact, diffuse chronic bruising over BUEs, bandaged uclers on both heels and sacrum Neuro: Cranial nerves grossly intact, alert and oriented Psych: Appropriate affect  Labs / Imaging / Procedures: CBC Latest Ref Rng 02/24/2016 02/24/2016 02/23/2016  WBC 4.0 - 10.5 K/uL 28.0(H) 28.3(H) -  Hemoglobin 12.0 - 15.0 g/dL 8.6(L) 8.5(L) 7.1(L)  Hematocrit 36.0 - 46.0 % 27.5(L) 27.0(L)  21.0(L)  Platelets 150 - 400 K/uL 205 203 -   BMP Latest Ref Rng 02/23/2016 02/23/2016 02/09/2016  Glucose 65 - 99 mg/dL 187(H) 188(H) 81  BUN 6 - 20 mg/dL 18 23(H) 29(H)  Creatinine 0.44 - 1.00 mg/dL 1.26(H) 1.10(H) 1.34(H)  Sodium 135 - 145 mmol/L 139 139 139  Potassium 3.5 - 5.1 mmol/L 4.8 4.7 4.3  Chloride 101 - 111 mmol/L 102 97(L) 104  CO2 22 - 32 mmol/L 30 - 28  Calcium 8.9 - 10.3 mg/dL 8.6(L) - 7.6(L)   Blood cultures pending  Dg Chest 2 View  02/24/2016  CLINICAL DATA:  Weakness and shortness breath. EXAM: CHEST  2 VIEW COMPARISON:  January 19, 2016 FINDINGS: No pneumothorax. Bilateral pleural effusions, right greater than left, with underlying atelectasis. The cardiomediastinal silhouette is stable. No pulmonary nodules, masses, or focal infiltrates. IMPRESSION: Bilateral pleural effusions with underlying atelectasis, new in the interval. Recommend follow-up to resolution. Electronically Signed   By: Dorise Bullion III M.D   On: 02/24/2016 02:18    Assessment/Plan:  Principal Problem:   Hypotension Active Problems:   Anemia of chronic disease   Autoimmune hepatitis (HCC)   Chronic diastolic CHF (congestive heart failure) (HCC)   CKD (chronic kidney disease), stage III   Pressure ulcer   PAF (paroxysmal atrial fibrillation) (HCC)   Bradycardia   Leukocytosis Ms.  Cynthia Hicks is a 82 y.o. year old woman with PMH significant for PAF, grade II diastolic CHF, anemia of chronic disease, CKD3, and autoimmune hepatitis on chronic steroids  admitted for an episode of bradycardia and hypotension at her SNF. She has had multiple recent hospitalizations and has minimal reserve given her malnutrition/deconditioning and polysystemic disease.  Respiratory distress and somnolence - acute, present on rounds this morning, may be impending respiratory failure due worsening of these new pleural effusions or developing pneumonia   - ABG  - Rpt CXR 2 view  - Continuous pulse ox  - Consider TTE     - May require thoracentesis  Episodes of bradycardia and hypotension - per EMS and SNF, recent episode occuring after one morning of N/V, previous episode after several days of fatigue and poor po intake, fluid responsive although remains in intermittent Afib-RVR on telemetry, these episodes may actually be PAF/RVR causing hypotension and SNF/EMS monitors are not able to get accurate HR reading - Cards consult, follows with Dr. Aundra Dubin - Holding bisoprolol and lasix - Consider TTE   Leukocytosis - WBC 28, no clear infectious source, pressure ulcers appear stable, endorsed some diarrhea but no BM yet during hospital stay  - Recheck CBC at noon today  - Follow blood cultures  - Add on urine culture  - Start broad spectrum ABX if worsens or becomes symptomatic  - Wound care consult  - If develops diarrhea, test c. Diff and enteric precautions  Anemia of chronic disease - Hb 8.5 at baseline, POC stool occult blood negative  - Daily CBC, may need transfusion  Autoimmune hepatitis  - Continue home prednisone DM  - SSI Chronic back pain  - Home oxycontin  Goals of care  - Discuss with patient/family later today  - Currently full code   Dispo: Anticipated discharge in approximately 1-2 day(s).     Asencion Partridge, MD 02/24/2016, 10:15 AM Pager: [phone]

## 2016-02-25 ENCOUNTER — Observation Stay (HOSPITAL_COMMUNITY): Payer: Medicare Other

## 2016-02-25 DIAGNOSIS — I130 Hypertensive heart and chronic kidney disease with heart failure and stage 1 through stage 4 chronic kidney disease, or unspecified chronic kidney disease: Secondary | ICD-10-CM | POA: Diagnosis present

## 2016-02-25 DIAGNOSIS — N184 Chronic kidney disease, stage 4 (severe): Secondary | ICD-10-CM | POA: Diagnosis present

## 2016-02-25 DIAGNOSIS — I25119 Atherosclerotic heart disease of native coronary artery with unspecified angina pectoris: Secondary | ICD-10-CM

## 2016-02-25 DIAGNOSIS — I5032 Chronic diastolic (congestive) heart failure: Secondary | ICD-10-CM | POA: Diagnosis present

## 2016-02-25 DIAGNOSIS — Z7189 Other specified counseling: Secondary | ICD-10-CM | POA: Diagnosis not present

## 2016-02-25 DIAGNOSIS — D72829 Elevated white blood cell count, unspecified: Secondary | ICD-10-CM | POA: Diagnosis not present

## 2016-02-25 DIAGNOSIS — D638 Anemia in other chronic diseases classified elsewhere: Secondary | ICD-10-CM

## 2016-02-25 DIAGNOSIS — L89152 Pressure ulcer of sacral region, stage 2: Secondary | ICD-10-CM | POA: Diagnosis present

## 2016-02-25 DIAGNOSIS — N179 Acute kidney failure, unspecified: Secondary | ICD-10-CM | POA: Diagnosis not present

## 2016-02-25 DIAGNOSIS — E1151 Type 2 diabetes mellitus with diabetic peripheral angiopathy without gangrene: Secondary | ICD-10-CM | POA: Diagnosis present

## 2016-02-25 DIAGNOSIS — I480 Paroxysmal atrial fibrillation: Secondary | ICD-10-CM | POA: Diagnosis present

## 2016-02-25 DIAGNOSIS — Z515 Encounter for palliative care: Secondary | ICD-10-CM | POA: Diagnosis not present

## 2016-02-25 DIAGNOSIS — E1122 Type 2 diabetes mellitus with diabetic chronic kidney disease: Secondary | ICD-10-CM | POA: Diagnosis present

## 2016-02-25 DIAGNOSIS — K754 Autoimmune hepatitis: Secondary | ICD-10-CM

## 2016-02-25 DIAGNOSIS — I951 Orthostatic hypotension: Secondary | ICD-10-CM | POA: Diagnosis not present

## 2016-02-25 DIAGNOSIS — E872 Acidosis: Secondary | ICD-10-CM | POA: Diagnosis present

## 2016-02-25 DIAGNOSIS — Z66 Do not resuscitate: Secondary | ICD-10-CM | POA: Diagnosis not present

## 2016-02-25 DIAGNOSIS — L89621 Pressure ulcer of left heel, stage 1: Secondary | ICD-10-CM | POA: Diagnosis present

## 2016-02-25 DIAGNOSIS — I481 Persistent atrial fibrillation: Secondary | ICD-10-CM | POA: Diagnosis not present

## 2016-02-25 DIAGNOSIS — I9589 Other hypotension: Secondary | ICD-10-CM | POA: Diagnosis not present

## 2016-02-25 DIAGNOSIS — I272 Other secondary pulmonary hypertension: Secondary | ICD-10-CM | POA: Diagnosis present

## 2016-02-25 DIAGNOSIS — R600 Localized edema: Secondary | ICD-10-CM | POA: Diagnosis not present

## 2016-02-25 DIAGNOSIS — R001 Bradycardia, unspecified: Secondary | ICD-10-CM | POA: Diagnosis not present

## 2016-02-25 DIAGNOSIS — L89611 Pressure ulcer of right heel, stage 1: Secondary | ICD-10-CM | POA: Diagnosis present

## 2016-02-25 DIAGNOSIS — M7989 Other specified soft tissue disorders: Secondary | ICD-10-CM | POA: Diagnosis not present

## 2016-02-25 DIAGNOSIS — K5900 Constipation, unspecified: Secondary | ICD-10-CM | POA: Diagnosis present

## 2016-02-25 DIAGNOSIS — I959 Hypotension, unspecified: Secondary | ICD-10-CM | POA: Diagnosis present

## 2016-02-25 LAB — BLOOD CULTURE ID PANEL (REFLEXED)
ACINETOBACTER BAUMANNII: NOT DETECTED
CANDIDA PARAPSILOSIS: NOT DETECTED
CANDIDA TROPICALIS: NOT DETECTED
CARBAPENEM RESISTANCE: NOT DETECTED
Candida albicans: NOT DETECTED
Candida glabrata: NOT DETECTED
Candida krusei: NOT DETECTED
ENTEROBACTERIACEAE SPECIES: NOT DETECTED
ENTEROCOCCUS SPECIES: NOT DETECTED
Enterobacter cloacae complex: NOT DETECTED
Escherichia coli: NOT DETECTED
HAEMOPHILUS INFLUENZAE: NOT DETECTED
KLEBSIELLA OXYTOCA: NOT DETECTED
Klebsiella pneumoniae: NOT DETECTED
LISTERIA MONOCYTOGENES: NOT DETECTED
METHICILLIN RESISTANCE: NOT DETECTED
Neisseria meningitidis: NOT DETECTED
PROTEUS SPECIES: NOT DETECTED
Pseudomonas aeruginosa: NOT DETECTED
SERRATIA MARCESCENS: NOT DETECTED
STAPHYLOCOCCUS AUREUS BCID: NOT DETECTED
STAPHYLOCOCCUS SPECIES: NOT DETECTED
Streptococcus agalactiae: NOT DETECTED
Streptococcus pneumoniae: NOT DETECTED
Streptococcus pyogenes: NOT DETECTED
Streptococcus species: NOT DETECTED
VANCOMYCIN RESISTANCE: NOT DETECTED

## 2016-02-25 LAB — GLUCOSE, CAPILLARY
GLUCOSE-CAPILLARY: 211 mg/dL — AB (ref 65–99)
GLUCOSE-CAPILLARY: 261 mg/dL — AB (ref 65–99)
Glucose-Capillary: 39 mg/dL — CL (ref 65–99)
Glucose-Capillary: 55 mg/dL — ABNORMAL LOW (ref 65–99)
Glucose-Capillary: <10 mg/dL — CL (ref 65–99)
Glucose-Capillary: 346 mg/dL — ABNORMAL HIGH (ref 65–99)
Glucose-Capillary: 180 mg/dL — ABNORMAL HIGH (ref 65–99)
Glucose-Capillary: 151 mg/dL — ABNORMAL HIGH (ref 65–99)

## 2016-02-25 LAB — CBC
HCT: 25.5 % — ABNORMAL LOW (ref 36.0–46.0)
HEMOGLOBIN: 8.1 g/dL — AB (ref 12.0–15.0)
MCH: 28 pg (ref 26.0–34.0)
MCHC: 31.8 g/dL (ref 30.0–36.0)
MCV: 88.2 fL (ref 78.0–100.0)
PLATELETS: 154 10*3/uL (ref 150–400)
RBC: 2.89 MIL/uL — AB (ref 3.87–5.11)
RDW: 20.2 % — ABNORMAL HIGH (ref 11.5–15.5)
WBC: 16.6 10*3/uL — ABNORMAL HIGH (ref 4.0–10.5)

## 2016-02-25 LAB — BASIC METABOLIC PANEL
Anion gap: 6 (ref 5–15)
BUN: 16 mg/dL (ref 6–20)
CALCIUM: 8.2 mg/dL — AB (ref 8.9–10.3)
CO2: 29 mmol/L (ref 22–32)
CREATININE: 1 mg/dL (ref 0.44–1.00)
Chloride: 103 mmol/L (ref 101–111)
GFR, EST NON AFRICAN AMERICAN: 52 mL/min — AB (ref >60)
GLUCOSE: 72 mg/dL (ref 65–99)
Potassium: 4 mmol/L (ref 3.5–5.1)
Sodium: 138 mmol/L (ref 135–145)

## 2016-02-25 LAB — URINE CULTURE

## 2016-02-25 MED ORDER — LORATADINE 10 MG PO TABS
10.0000 mg | ORAL_TABLET | Freq: Every day | ORAL | Status: DC
  Administered 2016-02-25 – 2016-02-29 (×5): 10 mg via ORAL
  Filled 2016-02-25 (×5): qty 1

## 2016-02-25 MED ORDER — FUROSEMIDE 10 MG/ML IJ SOLN
20.0000 mg | Freq: Once | INTRAMUSCULAR | Status: AC
Start: 2016-02-25 — End: 2016-02-25
  Administered 2016-02-25: 20 mg via INTRAVENOUS
  Filled 2016-02-25: qty 2

## 2016-02-25 MED ORDER — BISOPROLOL FUMARATE 5 MG PO TABS
2.5000 mg | ORAL_TABLET | Freq: Every day | ORAL | Status: DC
  Administered 2016-02-25 – 2016-02-29 (×5): 2.5 mg via ORAL
  Filled 2016-02-25 (×5): qty 1

## 2016-02-25 MED ORDER — INSULIN ASPART 100 UNIT/ML ~~LOC~~ SOLN
0.0000 [IU] | Freq: Three times a day (TID) | SUBCUTANEOUS | Status: DC
  Administered 2016-02-25: 2 [IU] via SUBCUTANEOUS
  Administered 2016-02-25: 7 [IU] via SUBCUTANEOUS
  Administered 2016-02-26: 2 [IU] via SUBCUTANEOUS
  Administered 2016-02-26: 3 [IU] via SUBCUTANEOUS
  Administered 2016-02-26: 1 [IU] via SUBCUTANEOUS
  Administered 2016-02-27: 5 [IU] via SUBCUTANEOUS
  Administered 2016-02-27: 2 [IU] via SUBCUTANEOUS
  Administered 2016-02-28: 5 [IU] via SUBCUTANEOUS
  Administered 2016-02-28: 2 [IU] via SUBCUTANEOUS
  Administered 2016-02-28: 1 [IU] via SUBCUTANEOUS

## 2016-02-25 MED ORDER — NITROGLYCERIN 0.4 MG SL SUBL
SUBLINGUAL_TABLET | SUBLINGUAL | Status: AC
  Administered 2016-02-25: 0.8 mg
  Filled 2016-02-25: qty 1

## 2016-02-25 MED ORDER — SENNOSIDES-DOCUSATE SODIUM 8.6-50 MG PO TABS: 2.0000 | ORAL_TABLET | Freq: Once | ORAL | Status: DC

## 2016-02-25 MED ORDER — FERROUS SULFATE 325 (65 FE) MG PO TABS: 325.0000 mg | ORAL_TABLET | Freq: Two times a day (BID) | ORAL | Status: DC

## 2016-02-25 NOTE — Progress Notes (Signed)
Pt c/o feeling sob.  Check of pox shows Pt satting 99% on RA.  Call placed to attending.  Attending to order chest xray and also abdominal xray d/t c/o abdominal fullness.  Will cont to monitor Pt.

## 2016-02-25 NOTE — Progress Notes (Signed)
PHARMACY - PHYSICIAN COMMUNICATION CRITICAL VALUE ALERT - BLOOD CULTURE IDENTIFICATION (BCID)  Results for orders placed or performed during the hospital encounter of 02/23/16  Blood Culture ID Panel (Reflexed) (Collected: 02/24/2016  3:25 AM)  Result Value Ref Range   Enterococcus species NOT DETECTED NOT DETECTED   Vancomycin resistance NOT DETECTED NOT DETECTED   Listeria monocytogenes NOT DETECTED NOT DETECTED   Staphylococcus species NOT DETECTED NOT DETECTED   Staphylococcus aureus NOT DETECTED NOT DETECTED   Methicillin resistance NOT DETECTED NOT DETECTED   Streptococcus species NOT DETECTED NOT DETECTED   Streptococcus agalactiae NOT DETECTED NOT DETECTED   Streptococcus pneumoniae NOT DETECTED NOT DETECTED   Streptococcus pyogenes NOT DETECTED NOT DETECTED   Acinetobacter baumannii NOT DETECTED NOT DETECTED   Enterobacteriaceae species NOT DETECTED NOT DETECTED   Enterobacter cloacae complex NOT DETECTED NOT DETECTED   Escherichia coli NOT DETECTED NOT DETECTED   Klebsiella oxytoca NOT DETECTED NOT DETECTED   Klebsiella pneumoniae NOT DETECTED NOT DETECTED   Proteus species NOT DETECTED NOT DETECTED   Serratia marcescens NOT DETECTED NOT DETECTED   Carbapenem resistance NOT DETECTED NOT DETECTED   Haemophilus influenzae NOT DETECTED NOT DETECTED   Neisseria meningitidis NOT DETECTED NOT DETECTED   Pseudomonas aeruginosa NOT DETECTED NOT DETECTED   Candida albicans NOT DETECTED NOT DETECTED   Candida glabrata NOT DETECTED NOT DETECTED   Candida krusei NOT DETECTED NOT DETECTED   Candida parapsilosis NOT DETECTED NOT DETECTED   Candida tropicalis NOT DETECTED NOT DETECTED    Name of physician (or Provider) Contacted: Dr. Randell Patient  Changes to prescribed antibiotics required: None  Dimitri Ped, PharmD. PGY-2 Pharmacy Resident Pager: [phone] 02/25/2016  11:45 AM

## 2016-02-25 NOTE — Progress Notes (Signed)
2w19 Cynthia Hicks; pt c/o of chest pain, 2 nitro given, EKG copmplete; VSS; PT blood sugar dropped 39 now 211 with D50 and carbohydrate snack. Pt now chest pain free after two nitro. Paged on call cardiology Hochrein. He stated that the medical team would be rounding this morning. Will continue to monitor and pass along to day shift nurse

## 2016-02-25 NOTE — Progress Notes (Signed)
   Subjective: Poor appetite and abdominal pain this morning. Denies nausea or vomiting. Her abdominal pain started when she felt like she needed to have a bowel movement. She reports her bowel movement was small but watery. She feels that she needs to have another bowel movement. Objective: Vital signs in last 24 hours: Filed Vitals:   02/24/16 0335 02/24/16 1645 02/24/16 2208 02/25/16 0600  BP:  112/51 104/44 119/56  Pulse:  75 78 91  Temp:  97.7 F (36.5 C) 98.8 F (37.1 C) 99.1 F (37.3 C)  TempSrc:  Oral Axillary Axillary  Resp:  16 18 20   Height:      Weight:      SpO2: 96% 91% 100% 100%   Physical Exam General Apperance: NAD HEENT: Normocephalic, atraumatic, anicteric sclera Neck: Supple, trachea midline Lungs: Bibasilar crackles Heart: Regular rate and rhythm, no murmur/rub/gallop Abdomen: Soft, tender to palpation periumbilical, nondistended, no rebound/guarding Extremities: Warm and well perfused, +BLE edema Skin: Ecchymoses on skin Neurologic: Alert and interactive. No gross deficits.   Assessment/Plan: 82 year old woman with PAF, grade II diastolic CHF, anemia of chronic disease, CKD3, and autoimmune hepatitis on chronic steroids admitted for an episode of bradycardia and hypotension at her SNF.  Abdominal pain: Possibly from chronic constipation. She has not received any prn bowel regimen. -Senokot S once -Continue to monitor  Pleural effusions, chronic diastolic CHF: ABG yesterday unremarkable. 710ml UOP with two unrecorded occurrences.  -Continue home Lasix 40mg  PO daily -Oxygen supplementation prn O2 sat <90% -IS  Paroxysmal Atrial Fibrillation: BP 104/44-119/56 -Continue Eliquis -Restart low dose bisoprolol  Leukocytosis: Remains afebrile  Anemia of chronic disease: Baseline hgb 8. Stable. -Continue iron supplementation  Autoimmune hepatitis -Continue prednisone 10mg  daily  DM: Hypoglycemic this morning.  -Change SSI from moderate to  sensitive  CKD3: Cr down to 1 from 1.26. Baseline around 1.1  Chronic back pain:  -Tylenol prn -Continue to hold home oxycodone  Dispo: Anticipated discharge in approximately 1-2 day(s).     Milagros Loll, MD 02/25/2016, 7:33 AM Pager: [phone]

## 2016-02-25 NOTE — Progress Notes (Signed)
Hypoglycemic Event  CBG: 55  Treatment: 15 GM carbohydrate snack  Symptoms: None  Follow-up CBG: Time:6:15am CBG Result:39  Possible Reasons for Event: Inadequate meal intake  Treatment again: D50 given 6:27am Override by charge nurse Fulton Mole in pyxis   Recheck CBG 211 at 6:50am    Cynthia Hicks M

## 2016-02-25 NOTE — Progress Notes (Signed)
Primary cardiologist: Dr. [name]  Seen for followup: PAF, CAD  Subjective:    In bed side chair. No chest pain now, had an episode over night, chart reviewed. Indicates that she feels weak and tired, some abdominal fullness.  Objective:   Temp:  [97.7 F (36.5 C)-99.1 F (37.3 C)] 99.1 F (37.3 C) (07/22 0600) Pulse Rate:  [75-91] 91 (07/22 0600) Resp:  [16-20] 20 (07/22 0600) BP: (104-119)/(44-56) 119/56 mmHg (07/22 0600) SpO2:  [91 %-100 %] 100 % (07/22 0600) Last BM Date: 02/23/16  Filed Weights   02/24/16 0227  Weight: 110 lb 9.6 oz (50.168 kg)    Intake/Output Summary (Last 24 hours) at 02/25/16 0832 Last data filed at 02/25/16 0317  Gross per 24 hour  Intake    120 ml  Output    750 ml  Net   -630 ml    Telemetry: Paroxysmal to persistent atrial fibrillation, some sinus beats noted as well with frequent PACs.  Exam:  General: Frail-appearing elderly woman  Lungs: Clear although with diminished breath sounds.  Cardiac: Irregularly irregular, no gallop.  Abdomen: NABS.  Extremities: Scattered ecchymoses, chronic-appearing lymphedema of the legs.  Lab Results:  Basic Metabolic Panel:  Recent Labs Lab 02/23/16 1804 02/23/16 1824 02/25/16 0447  NA 139 139 138  K 4.7 4.8 4.0  CL 97* 102 103  CO2  --  30 29  GLUCOSE 188* 187* 72  BUN 23* 18 16  CREATININE 1.10* 1.26* 1.00  CALCIUM  --  8.6* 8.2*    Liver Function Tests:  Recent Labs Lab 02/23/16 1824  AST 18  ALT 10*  ALKPHOS 57  BILITOT 0.5  PROT 5.1*  ALBUMIN 2.8*    CBC:  Recent Labs Lab 02/24/16 0028 02/24/16 0319 02/24/16 1238  WBC 28.3* 28.0* 19.1*  HGB 8.5* 8.6* 8.0*  HCT 27.0* 27.5* 25.5*  MCV 88.5 87.6 88.9  PLT 203 205 160    BNP:  Recent Labs  05/06/15 1458  PROBNP 180.0*    Echocardiogram 02/08/2016: Study Conclusions  - Left ventricle: The cavity size was normal. Systolic function was  normal. The estimated ejection fraction was in the  range of 60%  to 65%. Wall motion was normal; there were no regional wall  motion abnormalities. Features are consistent with a pseudonormal  left ventricular filling pattern, with concomitant abnormal  relaxation and increased filling pressure (grade 2 diastolic  dysfunction). Doppler parameters are consistent with high  ventricular filling pressure. - Aortic valve: Moderate diffuse thickening and calcification,  consistent with sclerosis. - Mitral valve: Mild, late systolicsystolic bowing without  prolapse, involving the anterior leaflet. There was mild  regurgitation directed eccentrically and toward the free wall. - Left atrium: The atrium was mildly dilated. - Pulmonary arteries: PA peak pressure: 48 mm Hg (S).  Impressions:  - The right ventricular systolic pressure was increased consistent  with moderate pulmonary hypertension.   Medications:   Scheduled Medications: . apixaban  2.5 mg Oral BID  . [START ON 02/27/2016] cyanocobalamin  1,000 mcg Intramuscular Weekly  . ferrous sulfate  325 mg Oral BID WC  . fluticasone  1 spray Each Nare QHS  . furosemide  40 mg Oral Daily  . insulin aspart  0-9 Units Subcutaneous TID WC  . pantoprazole  40 mg Oral Daily  . pravastatin  80 mg Oral QPM  . predniSONE  10 mg Oral Q breakfast  . vitamin B-12  1,000 mcg Oral QPM  . vitamin C  1,000 mg Oral BID     PRN Medications:  acetaminophen, ondansetron, polyethylene glycol, polyvinyl alcohol, senna   Assessment:   1. Paroxysmal to persistent atrial fibrillation. Patient is on Eliquis for stroke prophylaxis with CHADSVASC score of 5. She has a history of bradycardia and intermittent renal insufficiency, was taken off of Tikosyn most recently. Had been able to tolerate bisoprolol at last hospital discharge.  2. Episode of chest pain. ECG from earlier in the a.m. showed atrial fibrillation with RVR, left anterior fascicular block, possible old lateral infarct pattern.  Nonspecific ST changes.  3. Multivessel CAD status post prior intervention to the RCA with DES in 2007. Myoview from August 2016 showed fixed inferolateral defect consistent with scar, LVEF 44%. Echocardiogram done earlier this month shows normal LVEF 60-65%.  4. Suspected component of intravascular volume depletion, also leukocytosis. Workup for infection/sepsis per primary team without obvious source.  5. Autoimmune hepatitis on prednisone.  6. Palliative care consultation pending.   Plan/Discussion:    Heart rate has been up with more frequent episodes of atrial fibrillation, possibly contributing to her chest pain. Would try and resume low-dose bisoprolol to try and achieve better heart rate control. She continues on Eliquis for stroke prophylaxis. Agree with Palliative care consultation to establish realistic goals of care. Anticipate conservative management from a cardiac perspective.   Satira Sark, M.D., F.A.C.C.

## 2016-02-26 DIAGNOSIS — D631 Anemia in chronic kidney disease: Secondary | ICD-10-CM

## 2016-02-26 DIAGNOSIS — E1122 Type 2 diabetes mellitus with diabetic chronic kidney disease: Secondary | ICD-10-CM

## 2016-02-26 DIAGNOSIS — I481 Persistent atrial fibrillation: Secondary | ICD-10-CM

## 2016-02-26 DIAGNOSIS — N183 Chronic kidney disease, stage 3 (moderate): Secondary | ICD-10-CM

## 2016-02-26 DIAGNOSIS — Z515 Encounter for palliative care: Secondary | ICD-10-CM

## 2016-02-26 DIAGNOSIS — M549 Dorsalgia, unspecified: Secondary | ICD-10-CM

## 2016-02-26 DIAGNOSIS — Z7189 Other specified counseling: Secondary | ICD-10-CM

## 2016-02-26 LAB — BASIC METABOLIC PANEL
ANION GAP: 8 (ref 5–15)
BUN: 16 mg/dL (ref 6–20)
CALCIUM: 8.4 mg/dL — AB (ref 8.9–10.3)
CO2: 29 mmol/L (ref 22–32)
Chloride: 101 mmol/L (ref 101–111)
Creatinine, Ser: 1.03 mg/dL — ABNORMAL HIGH (ref 0.44–1.00)
GFR calc Af Amer: 58 mL/min — ABNORMAL LOW (ref >60)
GFR, EST NON AFRICAN AMERICAN: 50 mL/min — AB (ref >60)
Glucose, Bld: 102 mg/dL — ABNORMAL HIGH (ref 65–99)
POTASSIUM: 3.7 mmol/L (ref 3.5–5.1)
SODIUM: 138 mmol/L (ref 135–145)

## 2016-02-26 LAB — CBC
HEMATOCRIT: 26.8 % — AB (ref 36.0–46.0)
HEMOGLOBIN: 8.4 g/dL — AB (ref 12.0–15.0)
MCH: 27.6 pg (ref 26.0–34.0)
MCHC: 31.3 g/dL (ref 30.0–36.0)
MCV: 88.2 fL (ref 78.0–100.0)
Platelets: 138 10*3/uL — ABNORMAL LOW (ref 150–400)
RBC: 3.04 MIL/uL — ABNORMAL LOW (ref 3.87–5.11)
RDW: 20.1 % — AB (ref 11.5–15.5)
WBC: 24.5 10*3/uL — AB (ref 4.0–10.5)

## 2016-02-26 LAB — GLUCOSE, CAPILLARY
GLUCOSE-CAPILLARY: 222 mg/dL — AB (ref 65–99)
Glucose-Capillary: 124 mg/dL — ABNORMAL HIGH (ref 65–99)
Glucose-Capillary: 160 mg/dL — ABNORMAL HIGH (ref 65–99)
Glucose-Capillary: 138 mg/dL — ABNORMAL HIGH (ref 65–99)

## 2016-02-26 LAB — MAGNESIUM: MAGNESIUM: 1.6 mg/dL — AB (ref 1.7–2.4)

## 2016-02-26 LAB — PHOSPHORUS: Phosphorus: 3.2 mg/dL (ref 2.5–4.6)

## 2016-02-26 MED ORDER — MAGNESIUM SULFATE 2 GM/50ML IV SOLN
2.0000 g | Freq: Once | INTRAVENOUS | Status: AC
  Administered 2016-02-26: 2 g via INTRAVENOUS
  Filled 2016-02-26: qty 50

## 2016-02-26 MED ORDER — TRAMADOL HCL 50 MG PO TABS
50.0000 mg | ORAL_TABLET | Freq: Four times a day (QID) | ORAL | Status: DC | PRN
  Administered 2016-02-26 – 2016-02-27 (×3): 50 mg via ORAL
  Filled 2016-02-26 (×4): qty 1

## 2016-02-26 MED ORDER — OXYCODONE HCL 5 MG PO TABS
5.0000 mg | ORAL_TABLET | Freq: Four times a day (QID) | ORAL | Status: DC | PRN
  Administered 2016-02-26 – 2016-02-29 (×6): 5 mg via ORAL
  Filled 2016-02-26 (×6): qty 1

## 2016-02-26 NOTE — Progress Notes (Signed)
  Date: 02/26/2016  Patient name: Cynthia Hicks  Medical record number: [id number]  Date of birth: Sep 30, 1932   Patient seen and examined. Case d/w residents in detail. I agree with findings and plan as documented in Dr. Marjory Sneddon note.  Patient complaining of pain in her back and legs today. No further abd pain and no diarrhea. HR still elevated in the 90s to 110s but better controlled on low dose bisoprolol. Her treatment options are limited by her soft BPs and her renal dysfunction. Will continue to monitor. Will c/w PO lasix for her b/l pleural effusions.  Of note, her leukocytosis worsened today. Uncertain etiology. She remains afebrile with no clear infectious etiology noted. Blood cx are 1/2 for gram + rods (possibly contaminant). Will f/u speciation. Will monitor off abx for now   Palliative care consult appreciated. Will get palliative care to f/u with patinet at SNF on d/c.    [name], MD 02/26/2016, 10:43 AM

## 2016-02-26 NOTE — Progress Notes (Signed)
Pt. has called often approx. q62min. to an hour to be repositioned in the recliner- either to put legs up or down; states she's uncomfortable; assisted to [facility]. Informed pt. that she maybe more comfortable in the bed; Megan, RN and I assisted pt. back to bed and repositioned.

## 2016-02-26 NOTE — Progress Notes (Signed)
   Subjective: Ate some food this morning. She reports her abdominal pain has resolved. She had two large soft bowel movements yesterday. Denies nausea or vomiting. Her dyspnea has improved some.  Objective: Vital signs in last 24 hours: Vitals:   02/25/16 1500 02/25/16 1950 02/25/16 2012 02/26/16 0638  BP: (!) 95/35 (!) 98/27 (!) 98/50 (!) 102/39  Pulse: 78 67  91  Resp: 18 18  20   Temp: 98.2 F (36.8 C) 98.6 F (37 C)  98.7 F (37.1 C)  TempSrc: Oral Oral  Oral  SpO2: 98% 96%  95%  Weight:      Height:       Physical Exam General Apperance: NAD HEENT: Normocephalic, atraumatic, anicteric sclera Neck: Supple, trachea midline Lungs: Bibasilar crackles Heart: Regular rate and rhythm, no murmur/rub/gallop Abdomen: Soft, nontender, nondistended, no rebound/guarding Extremities: Warm and well perfused, +BLE edema Skin: Ecchymoses on skin Neurologic: Alert and interactive. No gross deficits.   Assessment/Plan: 82 year old woman with PAF, grade II diastolic CHF, anemia of chronic disease, CKD3, and autoimmune hepatitis on chronic steroids admitted for an episode of bradycardia and hypotension at her SNF.  Abdominal pain: Resolved  Pleural effusions, chronic diastolic CHF: Received Lasix 20mg  IV x 1 yesterday. 1.5L uop.  -Continue home Lasix 40mg  PO daily -Oxygen supplementation prn O2 sat <90% -IS  Paroxysmal Atrial Fibrillation: BP soft - 95/35 to 102/39. HR between 67 and 110.  -Continue Eliquis -low dose bisoprolol  Leukocytosis: WBC up to 24.5 from 16.6. Remains afebrile  Anemia of chronic disease: Baseline hgb 8. Stable. -Pt refuses iron supplementation  Autoimmune hepatitis -Continue prednisone 10mg  daily  DM:  -SSI  CKD3: Cr stable at 1.03. Baseline around 1.1  Chronic back pain:  -Tylenol prn -tramadol 50mg  q6hr prn -oxycodone 5mg  q6hr prn breakthrough pain  Goals of care:  -Palliative following, appreciate recommendations -Will need palliative care  follow up at SNF  Dispo: Anticipated discharge in approximately 1-2 day(s).   LOS: 1 day   Milagros Loll, MD 02/26/2016, 10:09 AM Pager: [phone]

## 2016-02-26 NOTE — Progress Notes (Signed)
Daily Progress Note   Patient Name: Cynthia Hicks       Date: 02/26/2016 DOB: 06-30-1933  Age: 82 y.o. MRN#: [id number] Attending Physician: [name], MD Primary Care Physician: Bonnita Nasuti, MD Admit Date: 02/23/2016  Reason for Consultation/Follow-up: Establishing goals of care  Subjective: Met today with Cynthia Hicks. She reports feeling better today than yesterday and is much more engaged in conversation.  We reviewed again her conversation from yesterday including discussion that the hospital can be useful in her care as long as she is getting well enough from care she receives at the hospital to enjoy time outside the hospital, but there is going to come a time in the near future where, if her goal is to out of the hospital, she may be better served to plan on bringing care to bringing care to her with a focus on feeling as well as possible rather repeated trips to the hospital.   We reviewed a MOST form and discussed how to develop plan of care to focus on continuing therapies that would maximize chance of being well enough to return home and limiting therapies not in line with this goal.  She would like to review form this evening and is agreeable to be following up tomorrow to continue discussion.  See below for further details conversation.  Length of Stay: 1  Current Medications: Scheduled Meds:  . apixaban  2.5 mg Oral BID  . bisoprolol  2.5 mg Oral Daily  . [START ON 02/27/2016] cyanocobalamin  1,000 mcg Intramuscular Weekly  . fluticasone  1 spray Each Nare QHS  . furosemide  40 mg Oral Daily  . insulin aspart  0-9 Units Subcutaneous TID WC  . loratadine  10 mg Oral Daily  . pantoprazole  40 mg Oral Daily  . pravastatin  80 mg Oral QPM  . predniSONE  10 mg Oral Q  breakfast  . vitamin B-12  1,000 mcg Oral QPM    Continuous Infusions:    PRN Meds: acetaminophen, ondansetron, oxyCODONE, polyethylene glycol, polyvinyl alcohol, senna, traMADol  Physical Exam      General Apperance: NAD, lying in bed, more engaging and interactive today. HEENT: Normocephalic, atraumatic, anicteric sclera Neck: Supple, trachea midline Lungs: Bibasilar crackles Heart:Regular rate and rhythm, no murmur/rub/gallop Abdomen: Soft, tender to palpation periumbilical, nondistended,  no rebound/guarding Extremities: Warm and well perfused, +BLE edema Skin: Ecchymoses on skin Neurologic: No gross deficits.     Vital Signs: BP (!) 98/48 (BP Location: Right Arm)   Pulse 74   Temp 97.6 F (36.4 C) (Oral)   Resp 19   Ht 5' 1"  (1.549 m)   Wt 50.2 kg (110 lb 9.6 oz)   SpO2 97%   BMI 20.90 kg/m  SpO2: SpO2: 97 % O2 Device: O2 Device: Not Delivered O2 Flow Rate: O2 Flow Rate (L/min): 3 L/min  Intake/output summary:  Intake/Output Summary (Last 24 hours) at 02/26/16 2174 Last data filed at 02/26/16 1751  Gross per 24 hour  Intake              480 ml  Output              300 ml  Net              180 ml   LBM: Last BM Date: 02/25/16 Baseline Weight: Weight: 50.2 kg (110 lb 9.6 oz) Most recent weight: Weight: 50.2 kg (110 lb 9.6 oz)       Palliative Assessment/Data: 30%   Flowsheet Rows   Flowsheet Row Most Recent Value  Intake Tab  Referral Department  Hospitalist  Unit at Time of Referral  Med/Surg Unit  Palliative Care Primary Diagnosis  Pulmonary  Date Notified  02/24/16  Palliative Care Type  New Palliative care  Date of Admission  02/23/16  Date first seen by Palliative Care  02/25/16  # of days Palliative referral response time  1 Day(s)  # of days IP prior to Palliative referral  1  Clinical Assessment  Palliative Performance Scale Score  30%  Pain Max last 24 hours  8  Pain Min Last 24 hours  4  Psychosocial & Spiritual Assessment  Palliative  Care Outcomes  Patient/Family meeting held?  Yes  Who was at the meeting?  Patient  Palliative Care Outcomes  Provided advance care planning      Patient Active Problem List   Diagnosis Date Noted  . Hypotension 02/24/2016  . Leukocytosis 02/24/2016  . Pressure ulcer stage II 02/08/2016  . Bradycardia 02/07/2016  . Absolute anemia   . Anemia 01/26/2016  . Chest pain 01/19/2016  . PAF (paroxysmal atrial fibrillation) ([place])   . Hyperlipidemia   . PAD (peripheral artery disease) ([place])   . Chronic acquired lymphedema   . Septic arthritis of knee ([place]) 01/09/2016  . Malnutrition of moderate degree 01/08/2016  . Pressure ulcer 01/08/2016  . Sepsis due to methicillin resistant Staphylococcus aureus ([place]) 06/08/2015  . Visit for monitoring Tikosyn therapy   . CKD (chronic kidney disease), stage III 06/24/2014  . Acute on chronic diastolic CHF (congestive heart failure) ([place])   . Diabetes mellitus type 2, uncontrolled ([place])   . Polyneuropathy in diabetes(357.2) 03/04/2014  . Localized, primary osteoarthritis of shoulder region 11/02/2013  . Chronic diastolic CHF (congestive heart failure) ([place]) 05/02/2013  . Biliary calculi, common bile duct 02/28/2013  . Fungal infection of nail 02/28/2013  . H/O immunosuppressive therapy 02/28/2013  . Long term (current) use of anticoagulants 10/31/2012  . Swelling of limb 10/22/2012  . Pain in limb 10/22/2012  . Lymphedema 10/22/2012  . Arteriosclerosis of coronary artery 04/04/2012  . Encounter for long-term (current) use of steroids 04/04/2012  . Diabetes mellitus, type 2 ([place]) 04/04/2012  . HLD (hyperlipidemia) 04/04/2012  . Disease of liver 04/04/2012  . Heart attack ([place]) 04/04/2012  .  Long term current use of systemic steroids 04/04/2012  . Abnormal abdominal MRI 03/03/2012  . Abdominal pain 12/04/2011  . NSTEMI (non-ST elevated myocardial infarction) ([place]) 08/30/2011  . Coronary artery disease   . Chronic back pain   . Chronic  kidney disease   . Autoimmune liver disease   . Carotid bruit 07/02/2011  . Hypoxemia 11/17/2010  . Palpitations 11/06/2010  . OTHER NONINFECTIOUS DISORDERS LYMPHATIC CHANNELS 07/21/2009  . Diabetes mellitus ([place]) 07/07/2009  . Anemia of chronic disease 07/07/2009  . HTN (hypertension) 07/07/2009  . GERD 07/07/2009  . Autoimmune hepatitis ([place]) 07/07/2009  . DEGENERATIVE DISC DISEASE 07/07/2009  . PSOAS MUSCLE ABSCESS 07/05/2009  . HYPERLIPIDEMIA-MIXED 01/02/2009  . Chronic venous insufficiency 01/02/2009    Palliative Care Assessment & Plan   Recommendations/Plan:  Ms. Stoneberg was much more alert and willing to engage in conversation today.  She reports she does not want her children involved because "they will never talk about the fact that I'm going to die someday and tell me to just keep trying"  We reviewed a MOST form together and discussed how to develop plan of care to focus on continuing therapies that would maximize chance of being well enough to get out of the hospital and limiting therapies not in line with this goal.  I left a copy of a MOST form for her to review.  I will stop in tomorrow to see if this is something she would like to complete prior to leaving the hospital.  I left a copy of [facility] for her to review.     She reports needing more time to think prior to making any further changes to her care plan.  Goals of Care and Additional Recommendations:  Limitations on Scope of Treatment: Full Scope Treatment  Code Status:    Code Status Orders        Start     Ordered   02/24/16 0201  Full code  Continuous     02/24/16 0201    Code Status History    Date Active Date Inactive Code Status Order ID Comments User Context   02/07/2016  9:11 PM 02/09/2016  5:26 PM Full Code [id number]  Milagros Loll, MD Inpatient   01/26/2016  5:36 PM 01/27/2016  6:34 PM Full Code [id number]  Norman Herrlich, MD ED   01/06/2016  4:06 PM 01/10/2016  5:50  PM Partial Code [id number]  Norman Herrlich, MD ED   06/08/2015  9:14 PM 06/13/2015  7:08 PM Full Code [id number]  Lucious Groves, DO Inpatient   10/19/2014  5:16 PM 10/22/2014  5:00 PM Full Code [id number]  Eileen Stanford, PA-C Inpatient   06/18/2014  7:40 PM 06/24/2014  5:39 PM Full Code [id number]  Bethena Roys, MD Inpatient   08/29/2011  2:25 PM 09/01/2011  9:45 PM Full Code [id number]  Rogelia Mire, NP Inpatient    Advance Directive Documentation   Flowsheet Row Most Recent Value  Type of Advance Directive  Living will  Pre-existing out of facility DNR order (yellow form or pink MOST form)  No data  "MOST" Form in Place?  No data       Prognosis:   Unable to determine  Discharge Planning:  [place] for rehab with Palliative care service follow-up  Care plan was discussed with patient  Thank you for allowing the Palliative Medicine Team to assist in the care of this patient.   Time In:  1620 Time Out: 1700 Total Time 40 Prolonged Time Billed No      Greater than 50%  of this time was spent counseling and coordinating care related to the above assessment and plan.  Micheline Rough, MD  Please contact Palliative Medicine Team phone at [phone] for questions and concerns.

## 2016-02-26 NOTE — Consult Note (Signed)
Consultation Note Date: 02/26/2016   Patient Name: Cynthia Hicks  DOB: 1933-04-07  MRN: [id number]  Age / Sex: 82 y.o., female  PCP: Raelyn Number, MD Referring Physician: [name], MD  Reason for Consultation: Establishing goals of care  HPI/Patient Profile: 82 y.o. female  with past medical history of PMH significant for PAF, grade II diastolic CHF, anemia of chronic disease, CKD3, and autoimmune hepatitis on chronic steroids admitted on 02/23/2016 with bradycardia and hypotension.  She has had multiple readmissions over the last several weeks.   Clinical Assessment and Goals of Care: I met today with patient. I asked about having other members of her family involved in conversation she specifically declined this.  She reports the most important thing to her has been her family and her independence. She states she was living by herself up until a 5 weeks ago this had significant change in her functional status since an admission where she had surgery performed on her knees.  She was pleasant throughout conversation, but would not really engage in our conversation. When I asked, she reported having good understanding of what the doctors have been talking with her about. When asked until me her understanding of things, she reported "they say a lot."  When asked specific questions about her nutrition, functional status, and cognition she does endorse having changes in these domains. She believes most this occurred over the past several weeks since she had admission for knee surgery was performed.  We discussed that the hospital can be useful in her care as long as she is getting well enough from care she receives at the hospital to enjoy time outside the hospital, but there is going to come a time in the near future where, if her goal is to out of the hospital, she may be better served to plan on bringing  care to bringing care to her with a focus on feeling as well as possible rather repeated trips to the hospital.   We reviewed a MOST form and discussed how to develop plan of care to focus on continuing therapies that would maximize chance of being well enough to return home and limiting therapies not in line with this goal.  She declined to complete one today.  SUMMARY OF RECOMMENDATIONS   - Spoke at length with patient regarding her clinical course over the last several weeks with continued decline in nutrition, functional status, and cognition. She reported that she was listening but laid throughout most of encounter with eyes closed and would answer questions with one or 2 word answers.  - I offered to meet with her son was available in order to have a conversation with both of them regarding her current clinical course as well as pathways moving forward. She reported that she makes her own decisions and that she will consider everything we talked about. When I asked her specifically about calling her son to discuss, she declined and stated "No, don't call him." - I think that she would benefit from completion  of a MOST form.  I discussed this with her today.  She declined and reports that she needs "time to think." - She was not very engaged in our conversation and specifically declined to have her family involved in future discussion. She reports understanding what we were talking about needing time in order to process. I will attempt to follow-up again tomorrow in order to continue conversation. - I would recommend that she be followed by palliative care at SNF once she is discharged. Please include this on discharge summary if palliative care f/u at SNF is indicated.  Code Status/Advance Care Planning:  Full code  Palliative Prophylaxis:   Aspiration, Bowel Regimen, Delirium Protocol and Frequent Pain Assessment  Additional Recommendations (Limitations, Scope, Preferences):  Full Scope  Treatment  Psycho-social/Spiritual:   Desire for further Chaplaincy support:no  Additional Recommendations: Caregiving  Support/Resources  Prognosis:   Unable to determine  Discharge Planning: [place] for rehab with Palliative care service follow-up      Primary Diagnoses: Present on Admission: . Hypotension . Autoimmune hepatitis ([place]) . PAF (paroxysmal atrial fibrillation) ([place]) . Bradycardia . CKD (chronic kidney disease), stage III . Chronic diastolic CHF (congestive heart failure) ([place]) . Pressure ulcer . Anemia of chronic disease   I have reviewed the medical record, interviewed the patient and family, and examined the patient. The following aspects are pertinent.  Past Medical History:  Diagnosis Date  . Anemia 02/07/2016   REQUIRING A BLOOD TRANSFUSION  . Arthritis   . Autoimmune liver disease    a.  Followed @ [facility] - on chronic prednisone and  sirolimus  . Bulging disc   . Cellulitis    a. chronic cellulitis of LE  . Chronic acquired lymphedema   . Chronic back pain    a.  On Methadone  . Chronic diastolic CHF (congestive heart failure) ([place])    a. 06/2014 TEE: EF 60-65%, Gr 2 DD, mild MR, mildly dil LA, Grade IV descending thoracic Ao plaque.  . CKD (chronic kidney disease), stage IV ([place])   . Coronary artery disease    a.  MI 01/2003;  b.  02/2006 NSTEMI - LAD 80-40m LCX nl, RCA 959m RCA stented w/ Taxus DES;   c.  2009 Neg MV;  d. 11/2010 Echo - EF 65%;  e. 02/2013 MV: LeCarlton Adamardiolite that was low risk with no ischemia.  . Diabetes mellitus   . Diabetic neuropathy ([facility] [place]  . Esophageal stricture    narrowing  . Hyperlipidemia    a. inability to stake statins due to liver disease  . Hypertension   . PAD (peripheral artery disease) ([facility]  . PAF (paroxysmal atrial fibrillation) ([facility]   a. Tikosyn initiated 10/2014;  CHA2DS2VASc = 6 (eliquis).  . Sciatica   . Sepsis ([facility]  . [facility]   Social History    Social History  . Marital status: Married    Spouse name: JoJenny Reichmann. Number of children: 2  . Years of education: N/A   Occupational History  . real esSport and exercise psychologist   retired   Social History Main Topics  . Smoking status: Never Smoker  . Smokeless tobacco: Never Used  . Alcohol use No  . Drug use: No  . Sexual activity: Not Asked   Other Topics Concern  . None   Social History Narrative   [place]  She is retired.  She is not very  active @ home.   Family History  Problem Relation Age of Onset  . Lung cancer Mother 97  . Heart attack Father 67  . Heart disease Father   . Congestive Heart Failure Father   . Heart disease Maternal Grandfather   . Hypertension Brother   . Hypertension Son   . Pancreatic cancer Mother    Scheduled Meds: . apixaban  2.5 mg Oral BID  . bisoprolol  2.5 mg Oral Daily  . [START ON 02/27/2016] cyanocobalamin  1,000 mcg Intramuscular Weekly  . fluticasone  1 spray Each Nare QHS  . furosemide  40 mg Oral Daily  . insulin aspart  0-9 Units Subcutaneous TID WC  . loratadine  10 mg Oral Daily  . pantoprazole  40 mg Oral Daily  . pravastatin  80 mg Oral QPM  . predniSONE  10 mg Oral Q breakfast  . senna-docusate  2 tablet Oral Once  . vitamin B-12  1,000 mcg Oral QPM   Continuous Infusions:  PRN Meds:.acetaminophen, ondansetron, polyethylene glycol, polyvinyl alcohol, senna, traMADol Medications Prior to Admission:  Prior to Admission medications   Medication Sig Start Date End Date Taking? Authorizing Provider  acetaminophen (TYLENOL) 325 MG tablet Take 2 tablets (650 mg total) by mouth every 6 (six) hours as needed for mild pain (or Fever >/= 101). 01/10/16  Yes Liberty Handy, MD  alendronate (FOSAMAX) 70 MG tablet Take 70 mg by mouth every Monday. Take with a full glass of water on an empty stomach.   Yes Historical Provider, MD  apixaban (ELIQUIS) 2.5 MG TABS tablet Take 1 tablet (2.5 mg total) by mouth 2 (two) times  daily. 03/07/15  Yes Larey Dresser, MD  Ascorbic Acid (VITAMIN C) 1000 MG tablet Take 1,000 mg by mouth 2 (two) times daily.    Yes Historical Provider, MD  bisoprolol (ZEBETA) 5 MG tablet Take 1 tablet (5 mg total) by mouth daily. 05/06/15  Yes Larey Dresser, MD  Cholecalciferol (VITAMIN D3) 2000 units TABS Take 1 tablet by mouth at bedtime.   Yes Historical Provider, MD  clotrimazole (MYCELEX) 10 MG troche Take 10 mg by mouth 5 (five) times daily.   Yes Historical Provider, MD  Ferrous Gluconate 256 (28 Fe) MG TABS Take 1 tablet by mouth 2 (two) times daily.   Yes Historical Provider, MD  fluticasone (FLONASE) 50 MCG/ACT nasal spray Place 1 spray into both nostrils at bedtime.   Yes Historical Provider, MD  furosemide (LASIX) 20 MG tablet Take 20 mg by mouth every evening.   Yes Historical Provider, MD  furosemide (LASIX) 40 MG tablet Take 40 mg by mouth every morning.   Yes Historical Provider, MD  insulin regular (HUMULIN R) 100 units/mL injection Inject 4-16 Units into the skin 3 (three) times daily before meals. Per sliding scale: BGL 120-200 = 4 units; 201-250 = 6 units; 251-300 = 8 units; 301-350 = 10 units; 351-400 = 14 units; 401-450 = 16 units   Yes Historical Provider, MD  levofloxacin (LEVAQUIN) 750 MG tablet Take 750 mg by mouth every other day.   Yes Historical Provider, MD  Multiple Vitamin (MULTIVITAMIN) tablet Take 1 tablet by mouth daily.   Yes Historical Provider, MD  nitroGLYCERIN (NITROSTAT) 0.4 MG SL tablet Place 1 tablet (0.4 mg total) under the tongue every 5 (five) minutes as needed for chest pain. 09/01/11  Yes Rogelia Mire, NP  omeprazole (PRILOSEC) 20 MG capsule Take 20 mg by mouth daily.  06/06/14  Yes  Historical Provider, MD  ondansetron (ZOFRAN) 8 MG tablet Take 8 mg by mouth every 8 (eight) hours as needed for nausea or vomiting.   Yes Historical Provider, MD  oxyCODONE 30 MG 12 hr tablet Take 30 mg by mouth every 12 (twelve) hours. FOR PAIN   Yes Historical  Provider, MD  polyethylene glycol (MIRALAX / GLYCOLAX) packet Take 17 g by mouth daily as needed for mild constipation or moderate constipation. 02/09/16  Yes Asencion Partridge, MD  polyvinyl alcohol (LIQUIFILM TEARS) 1.4 % ophthalmic solution Place 1 drop into both eyes daily as needed for dry eyes.   Yes Historical Provider, MD  pravastatin (PRAVACHOL) 80 MG tablet Take 1 tablet (80 mg total) by mouth every evening. 10/11/14  Yes Larey Dresser, MD  predniSONE (DELTASONE) 10 MG tablet Take 10 mg by mouth daily.    Yes Historical Provider, MD  senna (SENOKOT) 8.6 MG TABS tablet Take 1 tablet (8.6 mg total) by mouth at bedtime as needed for moderate constipation. Patient taking differently: Take 1 tablet by mouth at bedtime as needed for mild constipation or moderate constipation.  02/09/16  Yes Asencion Partridge, MD  vitamin B-12 (CYANOCOBALAMIN) 1000 MCG tablet Take 1,000 mcg by mouth every evening.   Yes Historical Provider, MD  collagenase (SANTYL) ointment Apply topically daily. 02/09/16   Asencion Partridge, MD  cyanocobalamin (,VITAMIN B-12,) 1000 MCG/ML injection Inject 1,000 mcg into the muscle once a week.    Historical Provider, MD  Insulin Regular Human (RELION R IJ) Inject 4-16 Units as directed 3 (three) times daily. BS 120-200=4 units, 201-250=6 units, 251-300=8 units, 301-350=10 units, 351-400=14 units, 401-450=16 units.    Historical Provider, MD  morphine (MS CONTIN) 60 MG 12 hr tablet Take 1 tablet (60 mg total) by mouth every 8 (eight) hours. Patient not taking: Reported on 02/23/2016 01/27/16   Jule Ser, DO   Allergies  Allergen Reactions  . Cyclosporine     Blasted bone marrow  . Azathioprine Other (See Comments)    Reaction unknown  . Mycophenolate Other (See Comments)    Pre-op note mentions "intolerance" to mycophenolate.  Unknown reaction, unknown severity.  . Other     Other reaction(s): Other (See Comments) Uncoded Allergy. Allergen: Other Allergy: See Patient Chart for Details,  Other Reaction: Toxicity (WHAT IS THE KNOWN ALLERGEN AND IT'S REACTION- ??)  . Shellfish Allergy Nausea And Vomiting   Review of Systems  Constitutional: Positive for activity change, appetite change and fatigue.  HENT: Positive for congestion.   Respiratory: Positive for cough and shortness of breath.   Gastrointestinal: Positive for abdominal distention, abdominal pain and constipation.  Musculoskeletal: Positive for back pain, myalgias and neck pain.  Neurological: Positive for weakness.  Psychiatric/Behavioral: Positive for sleep disturbance.    Physical Exam  General Apperance: NAD, sitting in chair, lays with eyes closed. Not very interactive. HEENT: Normocephalic, atraumatic, anicteric sclera Neck: Supple, trachea midline Lungs: Bibasilar crackles Heart: Regular rate and rhythm, no murmur/rub/gallop Abdomen: Soft, tender to palpation periumbilical, nondistended, no rebound/guarding Extremities: Warm and well perfused, +BLE edema Skin: Ecchymoses on skin Neurologic: No gross deficits. Vital Signs: BP (!) 102/39 (BP Location: Left Arm)   Pulse 91   Temp 98.7 F (37.1 C) (Oral)   Resp 20   Ht 5' 1"  (1.549 m)   Wt 50.2 kg (110 lb 9.6 oz)   SpO2 95%   BMI 20.90 kg/m  Pain Assessment: Faces   Pain Score: 7    SpO2: SpO2: 95 %  O2 Device:SpO2: 95 % O2 Flow Rate: .O2 Flow Rate (L/min): 3 L/min  IO: Intake/output summary:  Intake/Output Summary (Last 24 hours) at 02/26/16 0844 Last data filed at 02/26/16 0813  Gross per 24 hour  Intake              360 ml  Output             1500 ml  Net            -1140 ml    LBM: Last BM Date: 02/25/16 Baseline Weight: Weight: 50.2 kg (110 lb 9.6 oz) Most recent weight: Weight: 50.2 kg (110 lb 9.6 oz)     Palliative Assessment/Data: 30   Flowsheet Rows   Flowsheet Row Most Recent Value  Intake Tab  Referral Department  Hospitalist  Unit at Time of Referral  Med/Surg Unit  Palliative Care Primary Diagnosis  Pulmonary    Date Notified  02/24/16  Palliative Care Type  New Palliative care  Date of Admission  02/23/16  Date first seen by Palliative Care  02/25/16  # of days Palliative referral response time  1 Day(s)  # of days IP prior to Palliative referral  1  Clinical Assessment  Palliative Performance Scale Score  30%  Pain Max last 24 hours  8  Pain Min Last 24 hours  4  Psychosocial & Spiritual Assessment  Palliative Care Outcomes  Patient/Family meeting held?  Yes  Who was at the meeting?  Patient  Palliative Care Outcomes  Provided advance care planning      Time In: 1610 Time Out: 1715 Time Total: 65 Greater than 50%  of this time was spent counseling and coordinating care related to the above assessment and plan.  Signed by: Micheline Rough, MD   Please contact Palliative Medicine Team phone at [phone] for questions and concerns.  For individual provider: See Shea Evans

## 2016-02-26 NOTE — NC FL2 (Signed)
[place] LEVEL OF CARE SCREENING TOOL     IDENTIFICATION  Patient Name: Cynthia Hicks Birthdate: 1933/07/11 Sex: female Admission Date (Current Location): 02/23/2016  [facility] and [place] Number:  Herbalist and Address:  [employer]. [facility], [place] [address] [place], [place] [address]      Provider Number: [id number]  Attending Physician Name and Address:  [name], MD  Relative Name and Phone Number:       Current Level of Care: Hospital Recommended Level of Care: [place] Prior Approval Number:    Date Approved/Denied:   PASRR Number:   [id number]   Discharge Plan: SNF    Current Diagnoses: Patient Active Problem List   Diagnosis Date Noted  . Hypotension 02/24/2016  . Leukocytosis 02/24/2016  . Pressure ulcer stage II 02/08/2016  . Bradycardia 02/07/2016  . Absolute anemia   . Anemia 01/26/2016  . Chest pain 01/19/2016  . PAF (paroxysmal atrial fibrillation) ([place])   . Hyperlipidemia   . PAD (peripheral artery disease) ([place])   . Chronic acquired lymphedema   . Septic arthritis of knee ([place]) 01/09/2016  . Malnutrition of moderate degree 01/08/2016  . Pressure ulcer 01/08/2016  . Sepsis due to methicillin resistant Staphylococcus aureus ([place]) 06/08/2015  . Visit for monitoring Tikosyn therapy   . CKD (chronic kidney disease), stage III 06/24/2014  . Acute on chronic diastolic CHF (congestive heart failure) ([place])   . Diabetes mellitus type 2, uncontrolled ([place])   . Polyneuropathy in diabetes(357.2) 03/04/2014  . Localized, primary osteoarthritis of shoulder region 11/02/2013  . Chronic diastolic CHF (congestive heart failure) ([place]) 05/02/2013  . Biliary calculi, common bile duct 02/28/2013  . Fungal infection of nail 02/28/2013  . H/O immunosuppressive therapy 02/28/2013  . Long term (current) use of anticoagulants 10/31/2012  . Swelling of limb 10/22/2012  . Pain in limb 10/22/2012  .  Lymphedema 10/22/2012  . Arteriosclerosis of coronary artery 04/04/2012  . Encounter for long-term (current) use of steroids 04/04/2012  . Diabetes mellitus, type 2 ([place]) 04/04/2012  . HLD (hyperlipidemia) 04/04/2012  . Disease of liver 04/04/2012  . Heart attack ([place]) 04/04/2012  . Long term current use of systemic steroids 04/04/2012  . Abnormal abdominal MRI 03/03/2012  . Abdominal pain 12/04/2011  . NSTEMI (non-ST elevated myocardial infarction) ([place]) 08/30/2011  . Coronary artery disease   . Chronic back pain   . Chronic kidney disease   . Autoimmune liver disease   . Carotid bruit 07/02/2011  . Hypoxemia 11/17/2010  . Palpitations 11/06/2010  . OTHER NONINFECTIOUS DISORDERS LYMPHATIC CHANNELS 07/21/2009  . Diabetes mellitus ([place]) 07/07/2009  . Anemia of chronic disease 07/07/2009  . HTN (hypertension) 07/07/2009  . GERD 07/07/2009  . Autoimmune hepatitis ([place]) 07/07/2009  . DEGENERATIVE DISC DISEASE 07/07/2009  . PSOAS MUSCLE ABSCESS 07/05/2009  . HYPERLIPIDEMIA-MIXED 01/02/2009  . Chronic venous insufficiency 01/02/2009    Orientation RESPIRATION BLADDER Height & Weight     Self, Time, Situation, Place  Normal Continent Weight: 110 lb 9.6 oz (50.2 kg) Height:  5\' 1"  (154.9 cm)  BEHAVIORAL SYMPTOMS/MOOD NEUROLOGICAL BOWEL NUTRITION STATUS   (none)  (none) Continent Diet (Carb Modified)  AMBULATORY STATUS COMMUNICATION OF NEEDS Skin   Extensive Assist Verbally Surgical wounds                       Personal Care Assistance Level of Assistance  Bathing, Feeding, Dressing Bathing Assistance: Limited assistance Feeding assistance: Independent  Dressing Assistance: Limited assistance     Functional Limitations Info  Sight, Hearing, Speech Sight Info: Adequate Hearing Info: Adequate Speech Info: Adequate    SPECIAL CARE FACTORS FREQUENCY  PT (By licensed PT)     PT Frequency:  (5x/week)              Contractures Contractures Info: Not present     Additional Factors Info  Code Status, Allergies Code Status Info:  (Full) Allergies Info:  (Cyclosporine, Azathioprine, Mycophenolate, Other, Shellfish Allergy)           Current Medications (02/26/2016):  This is the current hospital active medication list Current Facility-Administered Medications  Medication Dose Route Frequency Provider Last Rate Last Dose  . acetaminophen (TYLENOL) tablet 650 mg  650 mg Oral Q6H PRN Dellia Nims, MD   650 mg at 02/26/16 0403  . apixaban (ELIQUIS) tablet 2.5 mg  2.5 mg Oral BID Tasrif Ahmed, MD   2.5 mg at 02/26/16 1048  . bisoprolol (ZEBETA) tablet 2.5 mg  2.5 mg Oral Daily Satira Sark, MD   2.5 mg at 02/26/16 1048  . [START ON 02/27/2016] cyanocobalamin ((VITAMIN B-12)) injection 1,000 mcg  1,000 mcg Intramuscular Weekly Tasrif Ahmed, MD      . fluticasone (FLONASE) 50 MCG/ACT nasal spray 1 spray  1 spray Each Nare QHS Tasrif Ahmed, MD   1 spray at 02/25/16 2154  . furosemide (LASIX) tablet 40 mg  40 mg Oral Daily Asencion Partridge, MD   40 mg at 02/26/16 1048  . insulin aspart (novoLOG) injection 0-9 Units  0-9 Units Subcutaneous TID WC Milagros Loll, MD   2 Units at 02/26/16 1212  . loratadine (CLARITIN) tablet 10 mg  10 mg Oral Daily Milagros Loll, MD   10 mg at 02/26/16 1048  . ondansetron (ZOFRAN) tablet 8 mg  8 mg Oral Q8H PRN Dellia Nims, MD   8 mg at 02/25/16 0911  . oxyCODONE (Oxy IR/ROXICODONE) immediate release tablet 5 mg  5 mg Oral Q6H PRN Milagros Loll, MD   5 mg at 02/26/16 1048  . pantoprazole (PROTONIX) EC tablet 40 mg  40 mg Oral Daily Tasrif Ahmed, MD   40 mg at 02/26/16 1047  . polyethylene glycol (MIRALAX / GLYCOLAX) packet 17 g  17 g Oral Daily PRN Tasrif Ahmed, MD      . polyvinyl alcohol (LIQUIFILM TEARS) 1.4 % ophthalmic solution 1 drop  1 drop Both Eyes Daily PRN Tasrif Ahmed, MD      . pravastatin (PRAVACHOL) tablet 80 mg  80 mg Oral QPM Tasrif Ahmed, MD   80 mg at 02/25/16 1745  . predniSONE (DELTASONE) tablet  10 mg  10 mg Oral Q breakfast Tasrif Ahmed, MD   10 mg at 02/26/16 [id number]  . senna (SENOKOT) tablet 8.6 mg  1 tablet Oral QHS PRN Tasrif Ahmed, MD      . traMADol (ULTRAM) tablet 50 mg  50 mg Oral Q6H PRN Milagros Loll, MD   50 mg at 02/26/16 [id number]  . vitamin B-12 (CYANOCOBALAMIN) tablet 1,000 mcg  1,000 mcg Oral QPM Tasrif Ahmed, MD   1,000 mcg at 02/25/16 1746     Discharge Medications: Please see discharge summary for a list of discharge medications.  Relevant Imaging Results:  Relevant Lab Results:   Additional Information SS#: [id number]  Junie Spencer, LCSW

## 2016-02-26 NOTE — Progress Notes (Signed)
Pt. wanted Oxycodone and informed her and her son med is no longer ordered and informed of note MD had written and that her BP has been little low, so Tylenol given.

## 2016-02-26 NOTE — Plan of Care (Signed)
Problem: Activity: Goal: Risk for activity intolerance will decrease Outcome: Progressing Pt. Up in recliner with assistance and hard to get comfortable.

## 2016-02-26 NOTE — Progress Notes (Signed)
Primary cardiologist: Dr. [name]  Seen for followup: PAF, CAD  Subjective:    Uncomfortable this morning complaining of back pain and stiffness. No chest pain or palpitations.  Objective:   Temp:  [98.2 F (36.8 C)-98.7 F (37.1 C)] 98.7 F (37.1 C) (07/23 [id number]) Pulse Rate:  [67-110] 91 (07/23 0638) Resp:  [18-20] 20 (07/23 [id number]) BP: (95-102)/(27-50) 102/39 (07/23 0638) SpO2:  [95 %-99 %] 95 % (07/23 0638) Last BM Date: 02/25/16  Filed Weights   02/24/16 0227  Weight: 110 lb 9.6 oz (50.2 kg)    Intake/Output Summary (Last 24 hours) at 02/26/16 0839 Last data filed at 02/26/16 0813  Gross per 24 hour  Intake              360 ml  Output             1500 ml  Net            -1140 ml    Telemetry: Persistent atrial fibrillation, heart rate somewhat better controlled.  Exam:  General: Frail-appearing elderly woman  Lungs: Clear although with diminished breath sounds.  Cardiac: Irregularly irregular, no gallop.  Abdomen: NABS.  Extremities: Scattered ecchymoses, chronic-appearing lymphedema of the legs.  Lab Results:  Basic Metabolic Panel:  Recent Labs Lab 02/23/16 1824 02/25/16 0447 02/26/16 0258  NA 139 138 138  K 4.8 4.0 3.7  CL 102 103 101  CO2 30 29 29   GLUCOSE 187* 72 102*  BUN 18 16 16   CREATININE 1.26* 1.00 1.03*  CALCIUM 8.6* 8.2* 8.4*  MG  --   --  1.6*    CBC:  Recent Labs Lab 02/24/16 1238 02/25/16 0957 02/26/16 0258  WBC 19.1* 16.6* 24.5*  HGB 8.0* 8.1* 8.4*  HCT 25.5* 25.5* 26.8*  MCV 88.9 88.2 88.2  PLT 160 154 138*    BNP:  Recent Labs  05/06/15 1458  PROBNP 180.0*    Echocardiogram 02/08/2016: Study Conclusions  - Left ventricle: The cavity size was normal. Systolic function was  normal. The estimated ejection fraction was in the range of 60%  to 65%. Wall motion was normal; there were no regional wall  motion abnormalities. Features are consistent with a pseudonormal  left ventricular filling  pattern, with concomitant abnormal  relaxation and increased filling pressure (grade 2 diastolic  dysfunction). Doppler parameters are consistent with high  ventricular filling pressure. - Aortic valve: Moderate diffuse thickening and calcification,  consistent with sclerosis. - Mitral valve: Mild, late systolicsystolic bowing without  prolapse, involving the anterior leaflet. There was mild  regurgitation directed eccentrically and toward the free wall. - Left atrium: The atrium was mildly dilated. - Pulmonary arteries: PA peak pressure: 48 mm Hg (S).  Impressions:  - The right ventricular systolic pressure was increased consistent  with moderate pulmonary hypertension.   Medications:   Scheduled Medications: . apixaban  2.5 mg Oral BID  . bisoprolol  2.5 mg Oral Daily  . [START ON 02/27/2016] cyanocobalamin  1,000 mcg Intramuscular Weekly  . fluticasone  1 spray Each Nare QHS  . furosemide  40 mg Oral Daily  . insulin aspart  0-9 Units Subcutaneous TID WC  . loratadine  10 mg Oral Daily  . pantoprazole  40 mg Oral Daily  . pravastatin  80 mg Oral QPM  . predniSONE  10 mg Oral Q breakfast  . senna-docusate  2 tablet Oral Once  . vitamin B-12  1,000 mcg Oral QPM    PRN Medications:  acetaminophen, ondansetron, polyethylene glycol, polyvinyl alcohol, senna, traMADol   Assessment:   1. Paroxysmal to persistent atrial fibrillation. Patient is on Eliquis for stroke prophylaxis with CHADSVASC score of 5. She has a history of bradycardia and intermittent renal insufficiency, was taken off of Tikosyn most recently. Low-dose bisoprolol was added back yesterday to assist with heart rate control.  2. Multivessel CAD status post prior intervention to the RCA with DES in 2007. Myoview from August 2016 showed fixed inferolateral defect consistent with scar, LVEF 44%. Echocardiogram done earlier this month shows normal LVEF 60-65%.  4. Suspected component of intravascular volume  depletion, also leukocytosis. Workup for infection/sepsis per primary team without obvious source.  5. Autoimmune hepatitis on prednisone.  6. Palliative care consultation pending.   Plan/Discussion:    Heart rate somewhat better controlled on low-dose bisoprolol although further up titration is limited by blood pressure. No change for now. She continues on Eliquis for stroke prophylaxis. Agree with Palliative care consultation to establish realistic goals of care. Anticipate conservative management from a cardiac perspective.   Satira Sark, M.D., F.A.C.C.

## 2016-02-26 NOTE — Progress Notes (Signed)
Pt. wanted to get OOB back to recliner; Megan,RN and I assisted pt. back to recliner; she stated she would be more comfortable there.

## 2016-02-27 ENCOUNTER — Inpatient Hospital Stay (HOSPITAL_COMMUNITY): Payer: Medicare Other

## 2016-02-27 ENCOUNTER — Ambulatory Visit: Payer: Medicare Other | Admitting: Cardiology

## 2016-02-27 ENCOUNTER — Encounter (HOSPITAL_COMMUNITY): Payer: Medicare Other

## 2016-02-27 DIAGNOSIS — I951 Orthostatic hypotension: Secondary | ICD-10-CM

## 2016-02-27 DIAGNOSIS — R600 Localized edema: Secondary | ICD-10-CM

## 2016-02-27 DIAGNOSIS — M7989 Other specified soft tissue disorders: Secondary | ICD-10-CM

## 2016-02-27 LAB — BASIC METABOLIC PANEL
Anion gap: 7 (ref 5–15)
BUN: 19 mg/dL (ref 6–20)
CALCIUM: 8.1 mg/dL — AB (ref 8.9–10.3)
CO2: 29 mmol/L (ref 22–32)
CREATININE: 1.15 mg/dL — AB (ref 0.44–1.00)
Chloride: 102 mmol/L (ref 101–111)
GFR calc Af Amer: 51 mL/min — ABNORMAL LOW (ref >60)
GFR calc non Af Amer: 44 mL/min — ABNORMAL LOW (ref >60)
GLUCOSE: 116 mg/dL — AB (ref 65–99)
Potassium: 4 mmol/L (ref 3.5–5.1)
Sodium: 138 mmol/L (ref 135–145)

## 2016-02-27 LAB — GLUCOSE, CAPILLARY
Glucose-Capillary: 172 mg/dL — ABNORMAL HIGH (ref 65–99)
Glucose-Capillary: 110 mg/dL — ABNORMAL HIGH (ref 65–99)
Glucose-Capillary: 262 mg/dL — ABNORMAL HIGH (ref 65–99)
Glucose-Capillary: 189 mg/dL — ABNORMAL HIGH (ref 65–99)

## 2016-02-27 LAB — HEPATIC FUNCTION PANEL
ALK PHOS: 71 U/L (ref 38–126)
ALT: 10 U/L — AB (ref 14–54)
AST: 14 U/L — ABNORMAL LOW (ref 15–41)
Albumin: 2.1 g/dL — ABNORMAL LOW (ref 3.5–5.0)
BILIRUBIN DIRECT: 0.1 mg/dL (ref 0.1–0.5)
BILIRUBIN INDIRECT: 0.5 mg/dL (ref 0.3–0.9)
Total Bilirubin: 0.6 mg/dL (ref 0.3–1.2)
Total Protein: 4.6 g/dL — ABNORMAL LOW (ref 6.5–8.1)

## 2016-02-27 LAB — CULTURE, BLOOD (ROUTINE X 2)

## 2016-02-27 LAB — CBC
HEMATOCRIT: 25.9 % — AB (ref 36.0–46.0)
Hemoglobin: 8.2 g/dL — ABNORMAL LOW (ref 12.0–15.0)
MCH: 27.4 pg (ref 26.0–34.0)
MCHC: 31.7 g/dL (ref 30.0–36.0)
MCV: 86.6 fL (ref 78.0–100.0)
Platelets: 125 10*3/uL — ABNORMAL LOW (ref 150–400)
RBC: 2.99 MIL/uL — ABNORMAL LOW (ref 3.87–5.11)
RDW: 20.1 % — AB (ref 11.5–15.5)
WBC: 21.8 10*3/uL — ABNORMAL HIGH (ref 4.0–10.5)

## 2016-02-27 MED ORDER — MORPHINE SULFATE (PF) 2 MG/ML IV SOLN
2.0000 mg | Freq: Once | INTRAVENOUS | Status: AC
  Administered 2016-02-27: 2 mg via INTRAVENOUS
  Filled 2016-02-27: qty 1

## 2016-02-27 MED ORDER — MORPHINE SULFATE ER 15 MG PO TBCR
60.0000 mg | EXTENDED_RELEASE_TABLET | Freq: Two times a day (BID) | ORAL | Status: DC
  Administered 2016-02-27 – 2016-02-29 (×5): 60 mg via ORAL
  Filled 2016-02-27 (×5): qty 4

## 2016-02-27 NOTE — Progress Notes (Addendum)
Daily Progress Note   Patient Name: Cynthia Hicks       Date: 02/27/2016 DOB: 1932-08-31  Age: 82 y.o. MRN#: [id number] Attending Physician: [name], MD Primary Care Physician: Bonnita Nasuti, MD Admit Date: 02/23/2016  Reason for Consultation/Follow-up: Establishing goals of care  Subjective: Met again with Cynthia Hicks.  She reports that her pain is much better this afternoon.    We talked again about MOST form that I had left for her review.  She said that she tried to discuss with her son last evening, but her son looked at the first question (resuscitation status) and said he "wants to keep me alive as long as possible.  I don't think he understands that I am getting older."  She was agreeable to idea of setting up a meeting with her sons in order to discuss long term goals/MOST form.  Length of Stay: 2  Current Medications: Scheduled Meds:  . apixaban  2.5 mg Oral BID  . bisoprolol  2.5 mg Oral Daily  . cyanocobalamin  1,000 mcg Intramuscular Weekly  . fluticasone  1 spray Each Nare QHS  . furosemide  40 mg Oral Daily  . insulin aspart  0-9 Units Subcutaneous TID WC  . loratadine  10 mg Oral Daily  . morphine  60 mg Oral Q12H  . pantoprazole  40 mg Oral Daily  . pravastatin  80 mg Oral QPM  . predniSONE  10 mg Oral Q breakfast  . vitamin B-12  1,000 mcg Oral QPM    Continuous Infusions:    PRN Meds: acetaminophen, ondansetron, oxyCODONE, polyethylene glycol, polyvinyl alcohol, senna, traMADol  Physical Exam      General Apperance: NAD, lying in bed, more engaging and interactive today. HEENT: Normocephalic, atraumatic, anicteric sclera Neck: Supple, trachea midline Lungs: Bibasilar crackles Heart:Regular rate and rhythm, no murmur/rub/gallop Abdomen: Soft,  tender to palpation periumbilical, nondistended, no rebound/guarding Extremities: Warm and well perfused, +BLE edema Skin: Ecchymoses on skin Neurologic: No gross deficits.     Vital Signs: BP (!) 89/23 (BP Location: Left Arm)   Pulse 85   Temp 98.1 F (36.7 C) (Oral)   Resp 20   Ht 5' 1"  (1.549 m)   Wt 50.2 kg (110 lb 9.6 oz)   SpO2 96%   BMI 20.90 kg/m  SpO2:  SpO2: 96 % O2 Device: O2 Device: Not Delivered O2 Flow Rate: O2 Flow Rate (L/min): 3 L/min  Intake/output summary:   Intake/Output Summary (Last 24 hours) at 02/27/16 2317 Last data filed at 02/27/16 1800  Gross per 24 hour  Intake                0 ml  Output              700 ml  Net             -700 ml   LBM: Last BM Date: 02/26/16 Baseline Weight: Weight: 50.2 kg (110 lb 9.6 oz) Most recent weight: Weight: 50.2 kg (110 lb 9.6 oz)       Palliative Assessment/Data: 30%   Flowsheet Rows   Flowsheet Row Most Recent Value  Intake Tab  Referral Department  Hospitalist  Unit at Time of Referral  Med/Surg Unit  Palliative Care Primary Diagnosis  Pulmonary  Date Notified  02/24/16  Palliative Care Type  New Palliative care  Date of Admission  02/23/16  Date first seen by Palliative Care  02/25/16  # of days Palliative referral response time  1 Day(s)  # of days IP prior to Palliative referral  1  Clinical Assessment  Palliative Performance Scale Score  30%  Pain Max last 24 hours  8  Pain Min Last 24 hours  4  Psychosocial & Spiritual Assessment  Palliative Care Outcomes  Patient/Family meeting held?  Yes  Who was at the meeting?  Patient  Palliative Care Outcomes  Provided advance care planning      Patient Active Problem List   Diagnosis Date Noted  . Palliative care encounter   . Goals of care, counseling/discussion   . Hypotension 02/24/2016  . Leukocytosis 02/24/2016  . Pressure ulcer stage II 02/08/2016  . Bradycardia 02/07/2016  . Absolute anemia   . Anemia 01/26/2016  . Chest pain  01/19/2016  . PAF (paroxysmal atrial fibrillation) ([place])   . Hyperlipidemia   . PAD (peripheral artery disease) ([place])   . Chronic acquired lymphedema   . Septic arthritis of knee ([place]) 01/09/2016  . Malnutrition of moderate degree 01/08/2016  . Pressure ulcer 01/08/2016  . Sepsis due to methicillin resistant Staphylococcus aureus ([place]) 06/08/2015  . Visit for monitoring Tikosyn therapy   . CKD (chronic kidney disease), stage III 06/24/2014  . Acute on chronic diastolic CHF (congestive heart failure) ([place])   . Diabetes mellitus type 2, uncontrolled ([place])   . Polyneuropathy in diabetes(357.2) 03/04/2014  . Localized, primary osteoarthritis of shoulder region 11/02/2013  . Chronic diastolic CHF (congestive heart failure) ([place]) 05/02/2013  . Biliary calculi, common bile duct 02/28/2013  . Fungal infection of nail 02/28/2013  . H/O immunosuppressive therapy 02/28/2013  . Long term (current) use of anticoagulants 10/31/2012  . Swelling of limb 10/22/2012  . Pain in limb 10/22/2012  . Lymphedema 10/22/2012  . Arteriosclerosis of coronary artery 04/04/2012  . Encounter for long-term (current) use of steroids 04/04/2012  . Diabetes mellitus, type 2 ([place]) 04/04/2012  . HLD (hyperlipidemia) 04/04/2012  . Disease of liver 04/04/2012  . Heart attack ([place]) 04/04/2012  . Long term current use of systemic steroids 04/04/2012  . Abnormal abdominal MRI 03/03/2012  . Abdominal pain 12/04/2011  . NSTEMI (non-ST elevated myocardial infarction) ([place]) 08/30/2011  . Coronary artery disease   . Chronic back pain   . Chronic kidney disease   . Autoimmune liver disease   . Carotid bruit 07/02/2011  .  Hypoxemia 11/17/2010  . Palpitations 11/06/2010  . OTHER NONINFECTIOUS DISORDERS LYMPHATIC CHANNELS 07/21/2009  . Diabetes mellitus ([place]) 07/07/2009  . Anemia of chronic disease 07/07/2009  . HTN (hypertension) 07/07/2009  . GERD 07/07/2009  . Autoimmune hepatitis ([place]) 07/07/2009  . DEGENERATIVE DISC  DISEASE 07/07/2009  . PSOAS MUSCLE ABSCESS 07/05/2009  . HYPERLIPIDEMIA-MIXED 01/02/2009  . Chronic venous insufficiency 01/02/2009    Palliative Care Assessment & Plan   Recommendations/Plan:  Ms. Guyett has continued to engage more in conversation regarding long term goals.  She has been having difficulty discussing with her sons (attempted again last night) and appreciative of offer to set up meeting to discuss goals with them.  Will attempt to arrange meeting with patient and her sons if she remains admitted until they can meet.  If not, this can be arranged as OP if palliative f/u ordered at SNF.  Goals of Care and Additional Recommendations:  Limitations on Scope of Treatment: Full Scope Treatment  Code Status:    Code Status Orders        Start     Ordered   02/24/16 0201  Full code  Continuous     02/24/16 0201    Code Status History    Date Active Date Inactive Code Status Order ID Comments User Context   02/07/2016  9:11 PM 02/09/2016  5:26 PM Full Code [id number]  Milagros Loll, MD Inpatient   01/26/2016  5:36 PM 01/27/2016  6:34 PM Full Code [id number]  Norman Herrlich, MD ED   01/06/2016  4:06 PM 01/10/2016  5:50 PM Partial Code [id number]  Norman Herrlich, MD ED   06/08/2015  9:14 PM 06/13/2015  7:08 PM Full Code [id number]  Lucious Groves, DO Inpatient   10/19/2014  5:16 PM 10/22/2014  5:00 PM Full Code [id number]  Eileen Stanford, PA-C Inpatient   06/18/2014  7:40 PM 06/24/2014  5:39 PM Full Code [id number]  Bethena Roys, MD Inpatient   08/29/2011  2:25 PM 09/01/2011  9:45 PM Full Code [id number]  Rogelia Mire, NP Inpatient    Advance Directive Documentation   Flowsheet Row Most Recent Value  Type of Advance Directive  Living will  Pre-existing out of facility DNR order (yellow form or pink MOST form)  No data  "MOST" Form in Place?  No data       Prognosis:   Unable to determine  Discharge Planning:  [place] for rehab with Palliative  care service follow-up  Care plan was discussed with patient  Thank you for allowing the Palliative Medicine Team to assist in the care of this patient.   Time In: 5329 Time Out: 1515 Total Time 30 Prolonged Time Billed No      Greater than 50%  of this time was spent counseling and coordinating care related to the above assessment and plan.  Micheline Rough, MD  Please contact Palliative Medicine Team phone at [phone] for questions and concerns.

## 2016-02-27 NOTE — Clinical Social Work Note (Signed)
Clinical Social Work Assessment  Patient Details  Name: Cynthia Hicks MRN: [id number] Date of Birth: 1932-10-21  Date of referral:  02/27/16               Reason for consult:  Facility Placement                Permission sought to share information with:  Facility Sport and exercise psychologist, Family Supports Permission granted to share information::  Yes, Verbal Permission Granted  Name::     Cynthia Hicks  Agency::  [facility]  Relationship::  son  Contact Information:  [phone]  Housing/Transportation Living arrangements for the past 2 months:  [place] of Information:  Patient Patient Interpreter Needed:  None Criminal Activity/Legal Involvement Pertinent to Current Situation/Hospitalization:  No - Comment as needed Significant Relationships:  Adult Children Lives with:  Facility Resident Do you feel safe going back to the place where you live?  Yes Need for family participation in patient care:  Yes (Comment)  Care giving concerns:  Patient is from [facility] and plans to return.    Social Worker assessment / plan:  CSW met with patient at beside to complete assessment. Patient was resting comfortably in bedside chair. Patient reported she is from [facility] and would like to return.  Patient reported her son Cynthia Hicks is her support. Per patient request CSW called Cynthia Hicks. He confirmed the plan is for the patient to return to [facility]. CSW will continue to follow for discharge needs.   Employment status:  Retired Forensic scientist:  [employer] PT Recommendations:  [place] / Referral to community resources:  [place]  Patient/Family's Response to care:  The patient appears happy with the care she is receiving in hospital and is appreciative of CSW assistance.  Patient/Family's Understanding of and Emotional Response to Diagnosis, Current Treatment, and Prognosis:  The patient has a good  understanding of why she was admitted. She understands the care plan and what she will need post discharge.  Emotional Assessment Appearance:  Appears stated age Attitude/Demeanor/Rapport:   (Patient was appropriate.) Affect (typically observed):  Accepting, Appropriate, Calm Orientation:  Oriented to Self, Oriented to Place, Oriented to  Time, Oriented to Situation Alcohol / Substance use:  Not Applicable Psych involvement (Current and /or in the community):  No (Comment)  Discharge Needs  Concerns to be addressed:  Discharge Planning Concerns Readmission within the last 30 days:  Yes Current discharge risk:  Dependent with Mobility Barriers to Discharge:  Continued Medical Work up   [employer], LCSW 02/27/2016, 11:50 AM

## 2016-02-27 NOTE — Clinical Social Work Placement (Signed)
   CLINICAL SOCIAL WORK PLACEMENT  NOTE  Date:  02/27/2016  Patient Details  Name: Cynthia Hicks MRN: [id number] Date of Birth: 1932/12/26  Clinical Social Work is seeking post-discharge placement for this patient at the [place] level of care (*CSW will initial, date and re-position this form in  chart as items are completed):  Yes   Patient/family provided with [place] Work Department's list of facilities offering this level of care within the geographic area requested by the patient (or if unable, by the patient's family).  Yes   Patient/family informed of their freedom to choose among providers that offer the needed level of care, that participate in Medicare, Medicaid or managed care program needed by the patient, have an available bed and are willing to accept the patient.  Yes   Patient/family informed of [facility]'s ownership interest in [facility] and [facility], as well as of the fact that they are under no obligation to receive care at these facilities.  PASRR submitted to EDS on       PASRR number received on       Existing PASRR number confirmed on 02/27/16     FL2 transmitted to all facilities in geographic area requested by pt/family on 02/27/16     FL2 transmitted to all facilities within larger geographic area on       Patient informed that his/her managed care company has contracts with or will negotiate with certain facilities, including the following:            Patient/family informed of bed offers received.  Patient chooses bed at       Physician recommends and patient chooses bed at [facility]    Patient to be transferred to   on  .  Patient to be transferred to facility by       Patient family notified on   of transfer.  Name of family member notified:        PHYSICIAN Please prepare priority discharge summary, including medications, Please sign FL2, Please prepare prescriptions     Additional  Comment:    _______________________________________________ Samule Dry, LCSW 02/27/2016, 11:59 AM

## 2016-02-27 NOTE — Progress Notes (Signed)
       Patient Name: Cynthia Hicks Date of Encounter: 02/27/2016    SUBJECTIVE: Sitting at bedside with no specific cardiac complaints. General complaints include not getting what she wanted for breakfast and having no appetite.  TELEMETRY:  Atrial fibrillation with moderate rate control Vitals:   02/26/16 1740 02/26/16 1940 02/26/16 2020 02/27/16 0408  BP: (!) 98/48 (!) 108/48 (!) 106/49 (!) 118/49  Pulse:  83 80 94  Resp:  18 17 18   Temp:  97.6 F (36.4 C) 98 F (36.7 C) 98.1 F (36.7 C)  TempSrc:  Oral Oral Oral  SpO2:  96% 96% 93%  Weight:      Height:        Intake/Output Summary (Last 24 hours) at 02/27/16 1023 Last data filed at 02/27/16 0300  Gross per 24 hour  Intake              360 ml  Output              800 ml  Net             -440 ml   LABS: Basic Metabolic Panel:  Recent Labs  02/26/16 0258 02/27/16 0257  NA 138 138  K 3.7 4.0  CL 101 102  CO2 29 29  GLUCOSE 102* 116*  BUN 16 19  CREATININE 1.03* 1.15*  CALCIUM 8.4* 8.1*  MG 1.6*  --   PHOS 3.2  --    CBC:  Recent Labs  02/26/16 0258 02/27/16 0257  WBC 24.5* 21.8*  HGB 8.4* 8.2*  HCT 26.8* 25.9*  MCV 88.2 86.6  PLT 138* 125*     Radiology/Studies:  No new data  Physical Exam: Blood pressure (!) 118/49, pulse 94, temperature 98.1 F (36.7 C), temperature source Oral, resp. rate 18, height 5\' 1"  (1.549 m), weight 110 lb 9.6 oz (50.2 kg), SpO2 93 %. Weight change:   Wt Readings from Last 3 Encounters:  02/24/16 110 lb 9.6 oz (50.2 kg)  02/16/16 111 lb (50.3 kg)  02/09/16 111 lb 3.2 oz (50.4 kg)   Decreased breath sounds in the right base otherwise clear lungs. Irregular rhythm. No rubs. No diastolic murmur. No edema.  ASSESSMENT:  1. Chronic diastolic heart failure without evidence of volume overload. 2. Relative rate control in paroxysmal atrial fibrillation 3. CAD stable without angina.  Plan:  1. No other specific instructions. 2. Please notify us if further  questions or concerns.   Signed, Belva Crome III 02/27/2016, 10:23 AM

## 2016-02-27 NOTE — Progress Notes (Signed)
  Date: 02/27/2016  Patient name: Cynthia Hicks  Medical record number: [id number]  Date of birth: [birth year]   Patient seen and examined. Case d/w residents in detail. I agree with findings and plan as documented in Dr. Durenda Age note.  Patient with persistent back pain which she states is worsening of her chronic back pain. Still with persistent leukocytosis - possibly secondary to pain. Has remained afebrile and blood cx only showed diptheroids which are a likely contaminant. Would check x rays of T and L spine given worsening pain - at high risk for compression fractures given age and chronic steroid use. Will also f/u R UE doppler given increased swelling without a clear etiology. Will c/w pain control for back pain.   [name], MD 02/27/2016, 1:24 PM

## 2016-02-27 NOTE — Progress Notes (Signed)
Subjective: Cynthia Hicks is very uncomfortable with significant lower back pain, L shoulder pain, and R calf pain this morning. She says that the back pain feels like her chronic back pain, but worsened because she has been spending so much time in bed during this hospitalization. Usually she sits in a comfortable recliner chair that she has at home. She denies any cough, n/v, dysuria, diarrhea or other new symptoms.   Telemetry reviewed and significant for ongoing intermittent Afib with HR in 110s.  Objective: Vital signs in last 24 hours: Vitals:   02/26/16 1740 02/26/16 1940 02/26/16 2020 02/27/16 0408  BP: (!) 98/48 (!) 108/48 (!) 106/49 (!) 118/49  Pulse:  83 80 94  Resp:  18 17 18   Temp:  97.6 F (36.4 C) 98 F (36.7 C) 98.1 F (36.7 C)  TempSrc:  Oral Oral Oral  SpO2:  96% 96% 93%  Weight:      Height:        Intake/Output Summary (Last 24 hours) at 02/27/16 0746 Last data filed at 02/27/16 0300  Gross per 24 hour  Intake              480 ml  Output              800 ml  Net             -320 ml   Physical Exam General appearance: Ill-appearing, frail, thin elderly female sitting slumped in bedside chair, very soft spoken, moans intermittently HENT: Normocephalic, moon-face, neck supple, trachea midline Cardiovascular: Irregularly irregular heart beat, no murmurs, rubs, gallops Respiratory/Chest: Clear to auscultation bilaterally, no increased work of breathing, no pain to palpation along thoracic spine, lumbar spine is tender to palpation Abdomen: Bowel sounds present, soft, non-tender, non-distended Extremities: LUE grossly swollen compared to RUE, 1+ pitting edema of RLE to knee, mildly erythematous and tender to palpation, minimal edema of LLE Skin: Warm, dry, intact, diffuse chronic bruising over BUEs, bandaged uclers on both heels and sacrum Neuro: Cranial nerves grossly intact, alert and oriented Psych: Appropriate affect  Labs / Imaging / Procedures: CBC Latest  Ref Rng & Units 02/27/2016 02/26/2016 02/25/2016  WBC 4.0 - 10.5 K/uL 21.8(H) 24.5(H) 16.6(H)  Hemoglobin 12.0 - 15.0 g/dL 8.2(L) 8.4(L) 8.1(L)  Hematocrit 36.0 - 46.0 % 25.9(L) 26.8(L) 25.5(L)  Platelets 150 - 400 K/uL 125(L) 138(L) 154   BMP Latest Ref Rng & Units 02/27/2016 02/26/2016 02/25/2016  Glucose 65 - 99 mg/dL 116(H) 102(H) 72  BUN 6 - 20 mg/dL 19 16 16   Creatinine 0.44 - 1.00 mg/dL 1.15(H) 1.03(H) 1.00  Sodium 135 - 145 mmol/L 138 138 138  Potassium 3.5 - 5.1 mmol/L 4.0 3.7 4.0  Chloride 101 - 111 mmol/L 102 101 103  CO2 22 - 32 mmol/L 29 29 29   Calcium 8.9 - 10.3 mg/dL 8.1(L) 8.4(L) 8.2(L)   Blood culture - GPR in 1 of 2, lab reports diptheroid appearance, likely contaminant  No results found.  Assessment/Plan:  Principal Problem:   Hypotension Active Problems:   Anemia of chronic disease   Autoimmune hepatitis (HCC)   Chronic diastolic CHF (congestive heart failure) (HCC)   CKD (chronic kidney disease), stage III   Pressure ulcer   PAF (paroxysmal atrial fibrillation) (HCC)   Bradycardia   Leukocytosis   Palliative care encounter   Goals of care, counseling/discussion Ms.  Cynthia Hicks is a 82 y.o. year old woman with PMH significant for PAF, grade II diastolic CHF, anemia of chronic  disease, CKD3, and autoimmune hepatitis on chronic steroids admitted for an episode of bradycardia and hypotension at her SNF. She has had multiple recent hospitalizations and has minimal reserve given her malnutrition/deconditioning and polysystemic disease.  Leukocytosis - WBC 21.8, no clear infectious source, pressure ulcers appear stable, worsening back pain may be indicative of new compression fractures vs osteomyeltitis  - DG 2-3 views thoracic and lumbar spine  - Monitor for symptoms  - Wound care per nursing  Acute LUE swelling, possible DVT, on Eliquis  - LUE doppler US  Chronic back pain, not-controlled  - OOBTC as tolerated  - Start MS Contin 60 mg BID  - Continue  Oxycodone 5mg  Q6H prn  - Continue Tramadol 50mg  Q6H prn  Pleural effusions, chronic dCHF - Lasix 40mg  po daily - Supplemental O2 as needed  PAF, rate control adequate - Bisoprolol 2.5mg  QD - Eliquis 2.5mg  BID  Anemia of chronic disease - Hb 8.2 at baseline, POC stool occult blood negative  - Daily CBC  Autoimmune hepatitis  - Continue home prednisone 10mg  QD DM  - SSI   Goals of care  - Palliative care following  Dispo: Anticipated discharge in approximately 1-2 day(s).   LOS: 2 days   Asencion Partridge, MD 02/27/2016, 7:46 AM Pager: [phone]

## 2016-02-28 DIAGNOSIS — M791 Myalgia: Secondary | ICD-10-CM

## 2016-02-28 DIAGNOSIS — M25512 Pain in left shoulder: Secondary | ICD-10-CM

## 2016-02-28 LAB — BASIC METABOLIC PANEL
ANION GAP: 10 (ref 5–15)
BUN: 25 mg/dL — ABNORMAL HIGH (ref 6–20)
CHLORIDE: 99 mmol/L — AB (ref 101–111)
CO2: 25 mmol/L (ref 22–32)
CREATININE: 1.31 mg/dL — AB (ref 0.44–1.00)
Calcium: 7.9 mg/dL — ABNORMAL LOW (ref 8.9–10.3)
GFR calc non Af Amer: 37 mL/min — ABNORMAL LOW (ref >60)
GFR, EST AFRICAN AMERICAN: 43 mL/min — AB (ref >60)
Glucose, Bld: 129 mg/dL — ABNORMAL HIGH (ref 65–99)
Potassium: 4.7 mmol/L (ref 3.5–5.1)
SODIUM: 134 mmol/L — AB (ref 135–145)

## 2016-02-28 LAB — CBC
HCT: 27 % — ABNORMAL LOW (ref 36.0–46.0)
HEMOGLOBIN: 8.6 g/dL — AB (ref 12.0–15.0)
MCH: 27.9 pg (ref 26.0–34.0)
MCHC: 31.9 g/dL (ref 30.0–36.0)
MCV: 87.7 fL (ref 78.0–100.0)
Platelets: 118 10*3/uL — ABNORMAL LOW (ref 150–400)
RBC: 3.08 MIL/uL — AB (ref 3.87–5.11)
RDW: 20.2 % — ABNORMAL HIGH (ref 11.5–15.5)
WBC: 26.2 10*3/uL — AB (ref 4.0–10.5)

## 2016-02-28 LAB — GLUCOSE, CAPILLARY
GLUCOSE-CAPILLARY: 271 mg/dL — AB (ref 65–99)
GLUCOSE-CAPILLARY: 234 mg/dL — AB (ref 65–99)
Glucose-Capillary: 150 mg/dL — ABNORMAL HIGH (ref 65–99)
Glucose-Capillary: 183 mg/dL — ABNORMAL HIGH (ref 65–99)

## 2016-02-28 MED ORDER — DICLOFENAC SODIUM 1 % TD GEL
2.0000 g | Freq: Four times a day (QID) | TRANSDERMAL | Status: DC
  Administered 2016-02-28 – 2016-02-29 (×4): 2 g via TOPICAL
  Filled 2016-02-28 (×2): qty 100

## 2016-02-28 MED ORDER — SENNOSIDES-DOCUSATE SODIUM 8.6-50 MG PO TABS
1.0000 | ORAL_TABLET | Freq: Every day | ORAL | Status: DC
  Administered 2016-02-28 – 2016-02-29 (×2): 1 via ORAL
  Filled 2016-02-28: qty 1

## 2016-02-28 NOTE — Progress Notes (Signed)
PT Cancellation Note  Patient Details Name: Cynthia Hicks MRN: [id number] DOB: [birth year]   Cancelled Treatment:    Reason Eval/Treat Not Completed: Other (comment). Attempted eval but pt didn't feel she could do anything at this time. Assisted pt to reposition in chair. Pt is from a SNF and planning to return to SNF. PT eval not required prior to return to SNF. Will try again tomorrow.   [name] 02/28/2016, 2:57 PM [facility] PT [phone]

## 2016-02-28 NOTE — Progress Notes (Signed)
The patient has allowed [place] to put her TED hose on after several attempts and education on the importance of them.

## 2016-02-28 NOTE — Progress Notes (Signed)
Patient has refused multiple times to wear her TED hose stating that she would wear them in the morning.

## 2016-02-28 NOTE — Care Management Important Message (Signed)
Important Message  Patient Details  Name: Cynthia Hicks MRN: [id number] Date of Birth: [birth year]   Medicare Important Message Given:  Yes    [name] May 02/28/2016, 10:35 AM

## 2016-02-28 NOTE — Progress Notes (Addendum)
Subjective: [name] is feeling better today and says her pain is better controlled compared to yesterday morning. She has been sitting in her bedside chair, padded with pillows, since yesterday and says this helps reduce her back pain significantly as well. Her LUE swelling is still present but starting to come down. Her left shoulder continues to ache and she is having new R thigh pain that she says feels like she pulled a muscle. She again refers to an experience at SNF where physical therapy had her cross/stretch her legs and this was very painful. There were no acute events overnight, but she remains relatively hypotensive down to 89/23. She has no complaints of chest pain, SOB, nausea, vomiting or dysuria this morning but continues to feel tired and have poor appetite. She has not had a bowel movemetn since the day before last. She met with palliative care again yesterday and they plan to coordinate a meeting with Cynthia Hicks and her sons to discuss realistic goals of care.   Objective: Vital signs in last 24 hours: Vitals:   02/27/16 0408 02/27/16 1314 02/27/16 1954 02/28/16 0446  BP: (!) 118/49 (!) 99/40 (!) 89/23 (!) 98/30  Pulse: 94 86 85 88  Resp: _0 Temp: 98.1 F (36.7 C) 98.2 F (36.8 C) 98.1 F (36.7 C) 99 F (37.2 C)  TempSrc: Oral Oral Oral Oral  SpO2: 93% 92% 96% 96%  Weight:      Height:        Intake/Output Summary (Last 24 hours) at 02/28/16 1101 Last data filed at 02/27/16 1800  Gross per 24 hour  Intake                0 ml  Output              200 ml  Net             -200 ml   Physical Exam General appearance: Ill-appearing, frail, thin elderly female sitting slumped in bedside chair, very soft spoken HENT: Normocephalic, moon-face, neck supple, trachea midline Cardiovascular: Irregularly irregular heart beat, no murmurs, rubs, gallops Respiratory/Chest: Clear to auscultation bilaterally, no increased work of breathing Abdomen: Bowel sounds present,  soft, non-tender, mildly distended Extremities: LUE swollen compared to RUE, 1+ pitting edema of RLE to knee, mildly erythematous and tender to palpation, minimal edema of LLE Skin: Warm, dry, intact, diffuse chronic bruising over BUEs, bandaged uclers on both heels and sacrum Neuro: Cranial nerves grossly intact, alert and oriented Psych: Appropriate affect  Labs / Imaging / Procedures: CBC Latest Ref Rng & Units 02/28/2016 02/27/2016 02/26/2016  WBC 4.0 - 10.5 K/uL 26.2(H) 21.8(H) 24.5(H)  Hemoglobin 12.0 - 15.0 g/dL 8.6(L) 8.2(L) 8.4(L)  Hematocrit 36.0 - 46.0 % 27.0(L) 25.9(L) 26.8(L)  Platelets 150 - 400 K/uL 118(L) 125(L) 138(L)   BMP Latest Ref Rng & Units 02/28/2016 02/27/2016 02/26/2016  Glucose 65 - 99 mg/dL 129(H) 116(H) 102(H)  BUN 6 - 20 mg/dL 25(H) 19 16  Creatinine 0.44 - 1.00 mg/dL 1.31(H) 1.15(H) 1.03(H)  Sodium 135 - 145 mmol/L 134(L) 138 138  Potassium 3.5 - 5.1 mmol/L 4.7 4.0 3.7  Chloride 101 - 111 mmol/L 99(L) 102 101  CO2 22 - 32 mmol/L _1 Calcium 8.9 - 10.3 mg/dL 7.9(L) 8.1(L) 8.4(L)   Dg Thoracic Spine 2 View  Result Date: 02/27/2016 CLINICAL DATA:  Low back pain.  Kyphosis. EXAM: THORACIC SPINE 2 VIEWS COMPARISON:  CT abdomen 01/26/2016 FINDINGS: Severe compression  fracture involving the T12 vertebral body is stable since prior CT. No acute fracture. Mild leftward scoliosis. IMPRESSION: Stable severe chronic T12 compression fracture. No acute bony abnormality. Electronically Signed   By: Rolm Baptise M.D.   On: 02/27/2016 14:57  Dg Lumbar Spine 2-3 Views  Result Date: 02/27/2016 CLINICAL DATA:  Low back pain.  Kyphosis. EXAM: LUMBAR SPINE - 2-3 VIEW COMPARISON:  CT 01/26/2016 FINDINGS: Severe compression fracture of T12. Mild compression fracture at L3. These findings are stable since prior study. Diffuse advanced degenerative disc and facet disease throughout the lumbar spine. Aortic and branch vessel calcifications without visible aneurysm. IMPRESSION: Severe  compression fracture at T12, mild compression fractured L3. Findings are stable since prior CT abdomen. Spondylosis. No acute bony abnormality. Electronically Signed   By: Rolm Baptise M.D.   On: 02/27/2016 14:49  LUE Duplex Ultrasound - prelim read shows no evidence of deep or superficial venous thrombosis  Assessment/Plan:  Principal Problem:   Hypotension Active Problems:   Anemia of chronic disease   Autoimmune hepatitis (HCC)   Chronic diastolic CHF (congestive heart failure) (HCC)   CKD (chronic kidney disease), stage III   Pressure ulcer   PAF (paroxysmal atrial fibrillation) (HCC)   Bradycardia   Leukocytosis   Palliative care encounter   Goals of care, counseling/discussion Ms.  Cynthia Hicks is a 82 y.o. year old woman with PMH significant for PAF, grade II diastolic CHF, anemia of chronic disease, CKD3, and autoimmune hepatitis on chronic steroids admitted for an episode of bradycardia and hypotension at her SNF. She has had multiple recent hospitalizations and has minimal reserve given her malnutrition/deconditioning and polysystemic disease.  Leukocytosis - WBC 21.8, no clear infectious source, pressure ulcers appear stable, worsening back pain may be indicative of new compression fractures vs osteomyeltitis  - DG thoracic and lumbar negative for acute findings  - Monitor for symptoms  - Wound care per nursing  Acute LUE swelling, resolving, on Eliquis  - LUE doppler US prelim read is negative for evidence of DVT  MSK pain, L shoulder, chronic back pain, control improved  - Added Voltaren gel to L shoulder 2g QID  - OOBTC as tolerated  - Start MS Contin 60 mg BID  - Continue Oxycodone 6m Q6H prn  - Continue Tramadol 587mQ6H prn  - Added Sennakot-S QD for constipation  Hypervolemia with pleural effusions, chronic dCHF, BLE edema  - Apply TED hose to BLEs  - hold lasix today given hypotension, lasix 2068mo daily as tolerated in the future  - Supplemental O2 as  needed  Acute on chronic kidney injury, mild, Cr 1.3 today, likely pre-renal / cardio-renal due to ongoing diuresis with poor po intake  - Hold lasix today, reduce dose in future  PAF, rate control adequate  - Bisoprolol 2.5mg58m  - Eliquis 2.5mg 28m  Anemia of chronic disease - Hb 8.6 at baseline, POC stool occult blood negative  - Daily CBC  Autoimmune hepatitis  - Continue home prednisone 10mg 18mM  - SSI  Goals of care  - Palliative care following, planning meeting with sons and patient  Dispo: Anticipated discharge in approximately 0-1 day(s).   LOS: 3 days   Ori Trejos JAsencion Partridge/25/2017, 11:01 AM Pager: [phone]

## 2016-02-28 NOTE — Discharge Instructions (Signed)

## 2016-02-28 NOTE — Progress Notes (Signed)
  Date: 02/28/2016  Patient name: Cynthia Hicks  Medical record number: [id number]  Date of birth: 17-Aug-1932   Patient seen and examined. Case d/w residents in detail. I agree with findings and plan as documented in Dr. Durenda Age note.  Patient states her pain is better controlled today and her LUE edema is improving. Denies any new complaints. No clear etiology for her leukocytosis. She remains afebrile. X rays of her lumbar and thoracic spines show no new compression fractures/evidence for osteomyelitis. Blood cx with diptheroids in 1 bottle - likely contaminant. U/a with no evidence for UTI. CXR with b/l effusions and likely underlying atelectasis. She has no symptoms of PNA (no cough, no fevers, no CP). It is possible that her leukocytosis is secondary to her chronic prednisone and increased acutely secondary to stress demargination of neutrophils due to uncontrolled pain. Will monitor closely  Patient with borderline blood pressures and mildly increased creatinine today. Will hold lasix today and start compression hose for mild b/l LE edema.    [name], MD 02/28/2016, 3:08 PM

## 2016-02-29 LAB — BASIC METABOLIC PANEL
ANION GAP: 10 (ref 5–15)
BUN: 31 mg/dL — ABNORMAL HIGH (ref 6–20)
CALCIUM: 7.6 mg/dL — AB (ref 8.9–10.3)
CO2: 26 mmol/L (ref 22–32)
CREATININE: 1.46 mg/dL — AB (ref 0.44–1.00)
Chloride: 98 mmol/L — ABNORMAL LOW (ref 101–111)
GFR, EST AFRICAN AMERICAN: 38 mL/min — AB (ref >60)
GFR, EST NON AFRICAN AMERICAN: 33 mL/min — AB (ref >60)
Glucose, Bld: 239 mg/dL — ABNORMAL HIGH (ref 65–99)
Potassium: 4.7 mmol/L (ref 3.5–5.1)
SODIUM: 134 mmol/L — AB (ref 135–145)

## 2016-02-29 LAB — GLUCOSE, CAPILLARY: GLUCOSE-CAPILLARY: 196 mg/dL — AB (ref 65–99)

## 2016-02-29 LAB — CBC
HCT: 30.7 % — ABNORMAL LOW (ref 36.0–46.0)
HEMOGLOBIN: 9.6 g/dL — AB (ref 12.0–15.0)
MCH: 27 pg (ref 26.0–34.0)
MCHC: 31.3 g/dL (ref 30.0–36.0)
MCV: 86.5 fL (ref 78.0–100.0)
PLATELETS: 135 10*3/uL — AB (ref 150–400)
RBC: 3.55 MIL/uL — AB (ref 3.87–5.11)
RDW: 19.7 % — ABNORMAL HIGH (ref 11.5–15.5)
WBC: 21.7 10*3/uL — AB (ref 4.0–10.5)

## 2016-02-29 LAB — CULTURE, BLOOD (ROUTINE X 2): Culture: NO GROWTH

## 2016-02-29 MED ORDER — PREDNISONE 10 MG PO TABS: 10.0000 mg | ORAL_TABLET | Freq: Every day | ORAL | 1 refills | Status: AC

## 2016-02-29 MED ORDER — TRAMADOL HCL 50 MG PO TABS: 50.0000 mg | ORAL_TABLET | Freq: Four times a day (QID) | ORAL | 0 refills | Status: AC | PRN

## 2016-02-29 MED ORDER — MORPHINE SULFATE ER 60 MG PO TBCR: 60.0000 mg | EXTENDED_RELEASE_TABLET | Freq: Two times a day (BID) | ORAL | 0 refills | Status: AC

## 2016-02-29 MED ORDER — FUROSEMIDE 20 MG PO TABS
20.0000 mg | ORAL_TABLET | Freq: Every day | ORAL | Status: DC
  Administered 2016-02-29: 20 mg via ORAL
  Filled 2016-02-29: qty 1

## 2016-02-29 MED ORDER — BISOPROLOL FUMARATE 5 MG PO TABS: 2.5000 mg | ORAL_TABLET | Freq: Every day | ORAL | 3 refills | Status: DC

## 2016-02-29 MED ORDER — DICLOFENAC SODIUM 1 % TD GEL: 2.0000 g | Freq: Four times a day (QID) | TRANSDERMAL | 2 refills | Status: AC

## 2016-02-29 MED ORDER — FUROSEMIDE 40 MG PO TABS: 40.0000 mg | ORAL_TABLET | Freq: Every day | ORAL | Status: DC

## 2016-02-29 NOTE — Progress Notes (Signed)
Subjective: Cynthia Hicks is in pain again this morning, her back is hurting and she has not moved from her bedside chair in quite some time. She is feeling depressed and hopeless this morning and feels like whenever she recovers a little, another illness finds a way to set her even further back. She is particularly demoralized by her immobility and deconditioning/weakness - stating that she was able to walk up and down the hall at [place] last week and now can barely move in her chair. She continues to endorse significant pain in her right medial thigh on movement that she feels is from a strained muscle. She is agreeable to returning to [facility] today and hopes her sons can bring her comfortable recliner chair from home.  Objective: Vital signs in last 24 hours: Vitals:   02/28/16 0446 02/28/16 1300 02/28/16 1942 02/29/16 0400  BP: (!) 98/30 (!) 94/44 (!) 114/41 122/63  Pulse: 88 87 94 89  Resp: 20 19 18 17   Temp: 99 F (37.2 C) 98 F (36.7 C) 97.9 F (36.6 C) 97.5 F (36.4 C)  TempSrc: Oral Oral Axillary Axillary  SpO2: 96% 93% 95% 90%  Weight:      Height:        Intake/Output Summary (Last 24 hours) at 02/29/16 1330 Last data filed at 02/29/16 1212  Gross per 24 hour  Intake              340 ml  Output             1050 ml  Net             -710 ml   Physical Exam General appearance: Ill-appearing, frail, thin elderly female sitting slumped in bedside chair, very soft spoken HENT: Normocephalic, moon-face, neck supple, trachea midline Cardiovascular: Irregularly irregular heart beat, no murmurs, rubs, gallops Respiratory/Chest: Clear to auscultation bilaterally, no increased work of breathing Abdomen: Bowel sounds present, soft, mildly tender to palpation, mildly distended Extremities: BUE symmetric, TED hoses in place BLEs, no visible lesion or injury of medial right thigh Skin: Warm, dry, intact, diffuse chronic bruising over BUEs, bandaged uclers on both heels and  sacrum Neuro: Cranial nerves grossly intact, alert and oriented Psych: Depressed affect  Labs / Imaging / Procedures: CBC Latest Ref Rng & Units 02/29/2016 02/28/2016 02/27/2016  WBC 4.0 - 10.5 K/uL 21.7(H) 26.2(H) 21.8(H)  Hemoglobin 12.0 - 15.0 g/dL 9.6(L) 8.6(L) 8.2(L)  Hematocrit 36.0 - 46.0 % 30.7(L) 27.0(L) 25.9(L)  Platelets 150 - 400 K/uL 135(L) 118(L) 125(L)   BMP Latest Ref Rng & Units 02/29/2016 02/28/2016 02/27/2016  Glucose 65 - 99 mg/dL 239(H) 129(H) 116(H)  BUN 6 - 20 mg/dL 31(H) 25(H) 19  Creatinine 0.44 - 1.00 mg/dL 1.46(H) 1.31(H) 1.15(H)  Sodium 135 - 145 mmol/L 134(L) 134(L) 138  Potassium 3.5 - 5.1 mmol/L 4.7 4.7 4.0  Chloride 101 - 111 mmol/L 98(L) 99(L) 102  CO2 22 - 32 mmol/L 26 25 29   Calcium 8.9 - 10.3 mg/dL 7.6(L) 7.9(L) 8.1(L)   VAS [place] UPPER EXT VENOUS DUPLEX LEFT - 02/27/2016 Summary: No evidence of deep vein or superficial thrombosis involving the left upper extremity and right subclavian vein.  Assessment/Plan:  Principal Problem:   Hypotension Active Problems:   Anemia of chronic disease   Autoimmune hepatitis (HCC)   Chronic diastolic CHF (congestive heart failure) (HCC)   CKD (chronic kidney disease), stage III   Pressure ulcer   PAF (paroxysmal atrial fibrillation) (HCC)   Bradycardia   Leukocytosis  Palliative care encounter   Goals of care, counseling/discussion Ms.  [name] is a 82 y.o. year old woman with PMH significant for PAF, grade II diastolic CHF, anemia of chronic disease, CKD3, and autoimmune hepatitis on chronic steroids admitted for an episode of bradycardia and hypotension at her SNF. She has had multiple recent hospitalizations and has minimal reserve given her malnutrition/deconditioning and polysystemic disease.  Leukocytosis - WBC 21.7, oscillating in mid 20s, no clear infectious source, pressure ulcers appear stable  - DG thoracic and lumbar negative for acute findings  - Cultures negative  - Monitor for symptoms  -  Wound care per nursing  MSK pain, L shoulder, chronic back pain, control improved  - Added Voltaren gel to L shoulder 2g QID, add for medial right thigh  - OOBTC as tolerated  - Start MS Contin 60 mg BID  - Continue Oxycodone 5mg  Q6H prn  - Continue Tramadol 50mg  Q6H prn  - Added Sennakot-S QD for constipation  Hypervolemia with pleural effusions, chronic dCHF, BLE edema  - Apply TED hose to BLEs  - Maintain Lasix 40mg  po daily as tolerated  - Supplemental O2 as needed  Acute on chronic kidney injury, mild, Cr 1.46 today, baseline 1.1-1.3, likely pre-renal / cardio-renal due to ongoing diuresis with poor po intake  - Continue good po intake, minimize NSAID use  - Follow up BMP in approx 1 week  PAF, rate control adequate  - Bisoprolol 2.5mg  QD  - Eliquis 2.5mg  BID  Anemia of chronic disease - Hb 8.6 at baseline, POC stool occult blood negative  - Daily CBC  Autoimmune hepatitis  - Continue home prednisone 10mg  QD DM  - SSI  Goals of care  - Palliative care following, planning meeting with sons and patient  Dispo: Anticipated discharge to [facility] SNF today.  LOS: 4 days   Asencion Partridge, MD 02/29/2016, 1:30 PM Pager: [phone]

## 2016-02-29 NOTE — Clinical Social Work Note (Signed)
Patient to be discharged back to [facility]. Patient updated regarding discharge. Patient to be transported via EMS. RN report number: [place], [place] [facility]: [phone] Surgical: [phone]

## 2016-02-29 NOTE — Progress Notes (Signed)
  Date: 02/29/2016  Patient name: Cynthia Hicks  Medical record number: [id number]  Date of birth: [birth year]   Patient seen and examined. Case d/w residents in detail. I agree with findings and plan as documented in Dr. Durenda Age note.  Patient still with pain in back and shoulder and right medial thigh. Pain is better controlled today on current regimen. No new complaints. Patient with persistent leukocytosis but slightly improved today. No clear infectious etiology noted - negative u/a, blood cx with diptheroids  1/2 (likely contaminant), no infiltrate on CXR, no fevers. I suspect that leukocytosis is secondary to steroids as well as worsening pain causing a transient elevation. Patient stable for d/c back to SNF today.  Patient to have palliative care follow up as an outpatient for further discussion about goals of care. Of note, Creatinine mildly worsened today -possibly secondary to PO lasix. Would repeat BMP in 1 week. Will c/w lasix for now given fluid overload noted on exam - LE edema and with pleural effusions on CXR,   [name], MD 02/29/2016, 1:39 PM

## 2016-02-29 NOTE — Care Management Note (Signed)
Case Management Note Marvetta Gibbons RN, BSN Unit 2W-Case Manager [phone]  Patient Details  Name: Cynthia Hicks MRN: [id number] Date of Birth: Jun 02, 1933  Subjective/Objective:    Pt admitted with hypotension                Action/Plan: PTA pt was at [facility] following for return to SNF when medically stable  Expected Discharge Date:      02/29/16            Expected Discharge Plan:  Skilled Nursing Facility  In-House Referral:  Clinical Social Work  Discharge planning Services  CM Consult  Post Acute Care Choice:    Choice offered to:     DME Arranged:    DME Agency:     HH Arranged:    [place] Agency:     Status of Service:  Completed, signed off  If discussed at [employer], dates discussed:    Additional Comments:  Dawayne Patricia, RN 02/29/2016, 3:07 PM

## 2016-02-29 NOTE — Discharge Summary (Signed)
Name: Cynthia Hicks MRN: [id number] DOB: 1933/08/03 82 y.o. PCP: Raelyn Number, MD  Date of Admission: 02/23/2016  2:29 PM Date of Discharge: 02/29/2016 Attending Physician: [name], MD  Discharge Diagnosis: 1. Hypotensive episode, likely secondary to paroxysmal atrial fibrillation triggered by acute illness  Principal Problem:   Hypotension Active Problems:   Anemia of chronic disease   Autoimmune hepatitis (HCC)   Chronic diastolic CHF (congestive heart failure) (HCC)   CKD (chronic kidney disease), stage III   Pressure ulcer   PAF (paroxysmal atrial fibrillation) (HCC)   Bradycardia   Leukocytosis   Palliative care encounter   Goals of care, counseling/discussion  Discharge Medications:   Medication List    STOP taking these medications   levofloxacin 750 MG tablet Commonly known as:  LEVAQUIN   oxyCODONE 30 MG 12 hr tablet     TAKE these medications   acetaminophen 325 MG tablet Commonly known as:  TYLENOL Take 2 tablets (650 mg total) by mouth every 6 (six) hours as needed for mild pain (or Fever >/= 101).   alendronate 70 MG tablet Commonly known as:  FOSAMAX Take 70 mg by mouth every Monday. Take with a full glass of water on an empty stomach.   apixaban 2.5 MG Tabs tablet Commonly known as:  ELIQUIS Take 1 tablet (2.5 mg total) by mouth 2 (two) times daily.   bisoprolol 5 MG tablet Commonly known as:  ZEBETA Take 0.5 tablets (2.5 mg total) by mouth daily. What changed:  how much to take   clotrimazole 10 MG troche Commonly known as:  MYCELEX Take 10 mg by mouth 5 (five) times daily.   collagenase ointment Commonly known as:  SANTYL Apply topically daily.   diclofenac sodium 1 % Gel Commonly known as:  VOLTAREN Apply 2 g topically 4 (four) times daily.   Ferrous Gluconate 256 (28 Fe) MG Tabs Take 1 tablet by mouth 2 (two) times daily.   FLONASE 50 MCG/ACT nasal spray Generic drug:  fluticasone Place 1 spray into both nostrils  at bedtime.   furosemide 40 MG tablet Commonly known as:  LASIX Take 40 mg by mouth every morning. What changed:  Another medication with the same name was removed. Continue taking this medication, and follow the directions you see here.   HUMULIN R 100 units/mL injection Generic drug:  insulin regular Inject 4-16 Units into the skin 3 (three) times daily before meals. Per sliding scale: BGL 120-200 = 4 units; 201-250 = 6 units; 251-300 = 8 units; 301-350 = 10 units; 351-400 = 14 units; 401-450 = 16 units What changed:  Another medication with the same name was removed. Continue taking this medication, and follow the directions you see here.   morphine 60 MG 12 hr tablet Commonly known as:  MS CONTIN Take 1 tablet (60 mg total) by mouth every 12 (twelve) hours. What changed:  when to take this   multivitamin tablet Take 1 tablet by mouth daily.   nitroGLYCERIN 0.4 MG SL tablet Commonly known as:  NITROSTAT Place 1 tablet (0.4 mg total) under the tongue every 5 (five) minutes as needed for chest pain.   omeprazole 20 MG capsule Commonly known as:  PRILOSEC Take 20 mg by mouth daily.   ondansetron 8 MG tablet Commonly known as:  ZOFRAN Take 8 mg by mouth every 8 (eight) hours as needed for nausea or vomiting.   polyethylene glycol packet Commonly known as:  MIRALAX / GLYCOLAX Take 17 g  by mouth daily as needed for mild constipation or moderate constipation.   polyvinyl alcohol 1.4 % ophthalmic solution Commonly known as:  LIQUIFILM TEARS Place 1 drop into both eyes daily as needed for dry eyes.   pravastatin 80 MG tablet Commonly known as:  PRAVACHOL Take 1 tablet (80 mg total) by mouth every evening.   predniSONE 10 MG tablet Commonly known as:  DELTASONE Take 1 tablet (10 mg total) by mouth daily with breakfast. What changed:  when to take this   senna 8.6 MG Tabs tablet Commonly known as:  SENOKOT Take 1 tablet (8.6 mg total) by mouth at bedtime as needed for  moderate constipation. What changed:  reasons to take this   traMADol 50 MG tablet Commonly known as:  ULTRAM Take 1 tablet (50 mg total) by mouth every 6 (six) hours as needed for severe pain.   vitamin B-12 1000 MCG tablet Commonly known as:  CYANOCOBALAMIN Take 1,000 mcg by mouth every evening. What changed:  Another medication with the same name was removed. Continue taking this medication, and follow the directions you see here.   vitamin C 1000 MG tablet Take 1,000 mg by mouth 2 (two) times daily.   Vitamin D3 2000 units Tabs Take 1 tablet by mouth at bedtime.       Disposition and follow-up:   [name] was discharged from [facility] in Stable condition.  At the hospital follow up visit please address:  1.  Leukocytosis, elevated and oscillating in mid-20s throughout hospitalization without any clear infective source, cultures negative  - MSK pain, chronic L shoulder, chronic lower back, acute R medial thigh muscle strain, given voltaren gel analgesia and restarted MS Contin BID for relief, will require continued PT/OT  - Hypervolemia with pleural effusions (R>L), present on admission, stable respiratory status, requires diureses, 52m po Lasix optimal  - Acute on chronic kidney injury, likely pre-renal, poor po intake, ongoing need for lasix diuresis, Cr gradually creeping up 1.1 to 1.3 to 1.4 by discharge, baseline appears 1.1-1.3, recommend rechecking BMP in a few days to ensure trend does not continue, avoid po NSAIDs  - Paroxysmal atrial fibrillation, likely the culprit for these episodes of hypotension, seems to be triggered by any mild acute illness with N/V that causes dehydration, should remain vigilant/attentive as patient declines easily and may require IV fluids with minimal illness, continued Eliquis and reduced Bisoprolol dose  - Goals of care - should continue discussions with palliative care, met with Dr. GMicheline Roughof inpatient  palliative care, plans for meeting with patient and her sons for realistic goals of care discussion  2.  Labs / imaging needed at time of follow-up: CBC, BMP  3.  Pending labs/ test needing follow-up: None  Follow-up Appointments: None    Hospital Course by problem list: Principal Problem:   Hypotension Active Problems:   Anemia of chronic disease   Autoimmune hepatitis ([place]   Chronic diastolic CHF (congestive heart failure) (HCC)   CKD (chronic kidney disease), stage III   Pressure ulcer   PAF (paroxysmal atrial fibrillation) (HCC)   Bradycardia   Leukocytosis   Palliative care encounter   Goals of care, counseling/discussion   1. Hypotensive episodes, paroxysmal atrial fibrillation, relatively hypotensive with atrial fibrillation-RVR occurring often on telemetry during the first two days of admission, resolved with mild fluid resuscitation and low-dose Bisoprolol for relative rate control, Eliquis continued.   2. Hypervolemia with pleural effusions, found on CXR shortly after  discharge, mildly tachypneic but maintaining O2 sat on room air, respiratory status improved with ongoing lasix diuresis, TED hose to BLEs  3. MSK pain (chronic back, L shoulder, acute R thigh), pain medications held initially due to hypotension, began having severe pain due to being in bed for 3-4 days, improved by sitting in chair, restarted with MS Contin 38m BID (patient said her Oxycontin rx was not controlling her pain), added Voltaren gel QID scheduled for shoulder and R thigh pain relief, also added PRN Tramadol  4. Leukocytosis, oscillated from low to high 20s throughout hospital stay, patient asymptomatic with no documented fevers, cultures negative, U/A and CXR unremarkable, no clear source, spine films showed no acute compression fracture, ongoing at discharge but patient clinically improved  5. Palliative care, recommended by primary team and cardiology consult, discussed with patient who met  with Dr. FDomingo Cockingfrom the inpatient palliative care team on several occasions to discuss goals of care and end of life decisions, attempting to complete MOST form, patient's sons in denial about her declining health, plan for meeting with patient and her sons with palliative care and [facility] the patient's health and numerous medical comorbidities is on a downward trajectory with diminishing hospital-free intervals  Discharge Vitals:   BP 122/63 (BP Location: Left Arm)   Pulse 89   Temp 97.5 F (36.4 C) (Axillary)   Resp 17   Ht 5' 1"  (1.549 m)   Wt 50.2 kg (110 lb 9.6 oz)   SpO2 90%   BMI 20.90 kg/m   Pertinent Labs, Studies, and Procedures:  CBC Latest Ref Rng & Units 02/29/2016 02/28/2016 02/27/2016  WBC 4.0 - 10.5 K/uL 21.7(H) 26.2(H) 21.8(H)  Hemoglobin 12.0 - 15.0 g/dL 9.6(L) 8.6(L) 8.2(L)  Hematocrit 36.0 - 46.0 % 30.7(L) 27.0(L) 25.9(L)  Platelets 150 - 400 K/uL 135(L) 118(L) 125(L)   BMP Latest Ref Rng & Units 02/29/2016 02/28/2016 02/27/2016  Glucose 65 - 99 mg/dL 239(H) 129(H) 116(H)  BUN 6 - 20 mg/dL 31(H) 25(H) 19  Creatinine 0.44 - 1.00 mg/dL 1.46(H) 1.31(H) 1.15(H)  Sodium 135 - 145 mmol/L 134(L) 134(L) 138  Potassium 3.5 - 5.1 mmol/L 4.7 4.7 4.0  Chloride 101 - 111 mmol/L 98(L) 99(L) 102  CO2 22 - 32 mmol/L 26 25 29   Calcium 8.9 - 10.3 mg/dL 7.6(L) 7.9(L) 8.1(L)   Urinalysis    Component Value Date/Time   COLORURINE YELLOW 02/23/2016 1556   APPEARANCEUR CLEAR 02/23/2016 1556   LABSPEC 1.011 02/23/2016 1556   PHURINE 7.5 02/23/2016 1556   GLUCOSEU NEGATIVE 02/23/2016 1556   GLUCOSEU NEG mg/dL 07/05/2009 2025   HGBUR NEGATIVE 02/23/2016 1556   BILIRUBINUR NEGATIVE 02/23/2016 1556   KETONESUR NEGATIVE 02/23/2016 1556   PROTEINUR NEGATIVE 02/23/2016 1556   UROBILINOGEN 0.2 06/08/2015 1612   NITRITE NEGATIVE 02/23/2016 1556   LEUKOCYTESUR NEGATIVE 02/23/2016 1556   Urine Culture  5d ago  Specimen  Description URINE, RANDOM  Special Requests NONE  Culture MULTIPLE SPECIES PRESENT, SUGGEST RECOLLECTION   Report Status 02/25/2016 FINAL  Resulting Agency [facility]   Blood culture  5d ago  Specimen Description BLOOD LEFT ANTECUBITAL  Special Requests BOTTLES DRAWN AEROBIC AND ANAEROBIC 5CC  Culture Setup Time GRAM POSITIVE RODS  AEROBIC BOTTLE ONLY  CRITICAL RESULT CALLED TO, READ BACK BY AND VERIFIED WITH: T STONE,PHARMD AT 1144 02/25/16 BY L BENFIELD      Culture DIPHTHEROIDS(CORYNEBACTERIUM SPECIES)  THE SIGNIFICANCE OF ISOLATING THIS ORGANISM FROM A SINGLE  SET OF BLOOD CULTURES WHEN MULTIPLE SETS ARE DRAWN IS UNCERTAIN. PLEASE NOTIFY THE MICROBIOLOGY DEPARTMENT WITHIN ONE WEEK IF SPECIATION AND SENSITIVITIES ARE REQUIRED.      Report Status 02/27/2016 FINAL    5d ago  Specimen Description BLOOD LEFT ANTECUBITAL  Special Requests IN PEDIATRIC BOTTLE 1CC  Culture NO GROWTH 4 DAYS  Report Status PENDING   Dg Thoracic Spine 2 View  Result Date: 02/27/2016 CLINICAL DATA:  Low back pain.  Kyphosis. EXAM: THORACIC SPINE 2 VIEWS COMPARISON:  CT abdomen 01/26/2016 FINDINGS: Severe compression fracture involving the T12 vertebral body is stable since prior CT. No acute fracture. Mild leftward scoliosis. IMPRESSION: Stable severe chronic T12 compression fracture. No acute bony abnormality. Electronically Signed   By: Rolm Baptise M.D.   On: 02/27/2016 14:57  Dg Lumbar Spine 2-3 Views  Result Date: 02/27/2016 CLINICAL DATA:  Low back pain.  Kyphosis. EXAM: LUMBAR SPINE - 2-3 VIEW COMPARISON:  CT 01/26/2016 FINDINGS: Severe compression fracture of T12. Mild compression fracture at L3. These findings are stable since prior study. Diffuse advanced degenerative disc and facet disease throughout the lumbar spine. Aortic and branch vessel calcifications without visible aneurysm. IMPRESSION: Severe compression fracture at T12, mild compression fractured L3. Findings are stable since prior  CT abdomen. Spondylosis. No acute bony abnormality. Electronically Signed   By: Rolm Baptise M.D.   On: 02/27/2016 14:49  Dg Abd Acute W/chest  Result Date: 02/25/2016 CLINICAL DATA:  Shortness of breath and epigastric pain EXAM: DG ABDOMEN ACUTE W/ 1V CHEST COMPARISON:  Chest x-ray from yesterday FINDINGS: No pneumothorax. Bilateral pleural effusions and underlying opacities are identified, unchanged. The chest is otherwise stable. No free air, portal venous gas, or pneumatosis. The bowel gas pattern is nonobstructive. Dense vascular calcifications are seen in the abdomen and pelvis. No other acute abnormalities IMPRESSION: Bilateral pleural effusions and underlying opacities. No acute abnormalities seen in the abdomen. Electronically Signed   By: Dorise Bullion III M.D   On: 02/25/2016 14:23   Discharge Instructions: Discharge Instructions    ([place]) Call MD:  Anytime you have any of the following symptoms: 1) 3 pound weight gain in 24 hours or 5 pounds in 1 week 2) shortness of breath, with or without a dry hacking cough 3) swelling in the hands, feet or stomach 4) if you have to sleep on extra pillows at night in order to breathe.    Complete by:  As directed   Call MD for:  difficulty breathing, headache or visual disturbances    Complete by:  As directed   Call MD for:  extreme fatigue    Complete by:  As directed   Call MD for:  persistant dizziness or light-headedness    Complete by:  As directed   Call MD for:  persistant nausea and vomiting    Complete by:  As directed   Call MD for:  severe uncontrolled pain    Complete by:  As directed   Call MD for:  temperature >100.4    Complete by:  As directed   Diet - low sodium heart healthy    Complete by:  As directed   Discharge instructions    Complete by:  As directed   Please continue to take your medications as prescribed and work with physical and occupational therapy. Continue to have conversations with palliative care  team with family to discuss goals of care and develop strategies to minimize hospitalization. Please call us of visit the  ER if you develop any concerning symptoms.   Increase activity slowly    Complete by:  As directed      Signed: Asencion Partridge, MD 02/29/2016, 1:45 PM   Pager: [phone]

## 2016-02-29 NOTE — Evaluation (Signed)
Physical Therapy Evaluation Patient Details Name: Cynthia Hicks MRN: [id number] DOB: Mar 08, 1933 Today's Date: 02/29/2016   History of Present Illness  patient is a 82 y/o female with PMH of CAD s/p stents, pAfib on a/c, DM, HTN, chronic diastolic HF, CKD, AI hepatitis on chronic prednisone and anemia who p/w hypotension and bradycardia from SNF. Patient is a poor historian. Patient was noted to have SBP in the 70s and HR in the 40s at SNF and was referred to ED for further eval. She complained of 2 episodes of n/v and SOB.  Clinical Impression  Pt admitted with above diagnosis. Pt currently with functional limitations due to the deficits listed below (see PT Problem List). Pt very limited in abilities.  Needed incr assist to stand.  Very deconditioned. Will need continued SNF.   Pt will benefit from skilled PT to increase their independence and safety with mobility to allow discharge to the venue listed below.      Follow Up Recommendations SNF;Supervision/Assistance - 24 hour    Equipment Recommendations  None recommended by PT    Recommendations for Other Services       Precautions / Restrictions Precautions Precautions: Fall Restrictions Weight Bearing Restrictions: No      Mobility  Bed Mobility               General bed mobility comments: Pt up in chair on PT arrival  Transfers Overall transfer level: Needs assistance Equipment used: Rolling walker (2 wheeled) Transfers: Sit to/from Stand Sit to Stand: Max assist         General transfer comment: Assist for boost to stand and to stabilize balance upon standing. VCs for safe hand placement and education on energy conservation/frequent rest breaks.Posterior lean  Ambulation/Gait             General Gait Details: unable- pt reports she can now only take 2 steps due to weakness.  Stairs            Wheelchair Mobility    Modified Rankin (Stroke Patients Only)       Balance Overall balance  assessment: Needs assistance Sitting-balance support: No upper extremity supported;Feet supported Sitting balance-Leahy Scale: Fair     Standing balance support: Bilateral upper extremity supported;During functional activity Standing balance-Leahy Scale: Poor Standing balance comment: relies on UEs for support                             Pertinent Vitals/Pain Pain Assessment: 0-10 Pain Score: 8  Pain Location: right side  Pain Descriptors / Indicators: Sharp;Stabbing Pain Intervention(s): Limited activity within patient's tolerance;Monitored during session;Repositioned;Patient requesting pain meds-RN notified  VSS, on 3LO2.      Home Living Family/patient expects to be discharged to:: Skilled nursing facility                      Prior Function Level of Independence: Needs assistance   Gait / Transfers Assistance Needed: Primarily uses w/c at SNF, occasional walking with RW when working with PT (75' at most). Prior to SNF stay, pt using 4ww independently.  ADL's / Homemaking Assistance Needed: Assist with all ADLs at SNF. Pt reports she has been too weak to complete by herself.        Hand Dominance   Dominant Hand: Right    Extremity/Trunk Assessment   Upper Extremity Assessment: Defer to OT evaluation (No shoulder movement L, elbow only; right shoulder to  90 deg)             RLE Deficits / Details: tingling in foot; strength hip 2/5, knee3/5 LLE Deficits / Details: tingling in foot; hip 3/5, knee 3+/5  Cervical / Trunk Assessment: Kyphotic (severely)  Communication   Communication: No difficulties  Cognition Arousal/Alertness: Awake/alert Behavior During Therapy: WFL for tasks assessed/performed Overall Cognitive Status: Within Functional Limits for tasks assessed       Memory: Decreased short-term memory              General Comments      Exercises General Exercises - Lower Extremity Long Arc Quad: AROM;Both;10 reps;Seated       Assessment/Plan    PT Assessment Patient needs continued PT services  PT Diagnosis Difficulty walking;Generalized weakness;Acute pain   PT Problem List Decreased strength;Pain;Decreased balance;Decreased activity tolerance;Decreased mobility;Decreased safety awareness;Impaired sensation;Decreased cognition;Decreased knowledge of use of DME;Decreased skin integrity  PT Treatment Interventions Balance training;Gait training;Functional mobility training;Therapeutic activities;Therapeutic exercise;Patient/family education;DME instruction   PT Goals (Current goals can be found in the Care Plan section) Acute Rehab PT Goals Patient Stated Goal: to get back to rehab and get stronger PT Goal Formulation: With patient Time For Goal Achievement: 03/14/16 Potential to Achieve Goals: Good    Frequency Min 2X/week   Barriers to discharge Decreased caregiver support      Co-evaluation               End of Session Equipment Utilized During Treatment: Gait belt;Oxygen Activity Tolerance: Patient limited by fatigue Patient left: in chair;with call bell/phone within reach Nurse Communication: Mobility status         Time: [id number] PT Time Calculation (min) (ACUTE ONLY): 10 min   Charges:   PT Evaluation $PT Eval Moderate Complexity: 1 Procedure     PT G CodesDenice Paradise 03-03-2016, 1:42 PM [place] Hong Moring,PT Acute Rehabilitation [phone] (pager)

## 2016-03-01 ENCOUNTER — Encounter: Payer: Self-pay | Admitting: Internal Medicine

## 2016-03-06 ENCOUNTER — Emergency Department (HOSPITAL_COMMUNITY): Payer: Medicare Other

## 2016-03-06 ENCOUNTER — Observation Stay (HOSPITAL_COMMUNITY)
Admission: EM | Admit: 2016-03-06 | Discharge: 2016-03-09 | Disposition: A | Discharge status: Skilled Nursing Facility | Payer: Medicare Other | Attending: Student in an Organized Health Care Education/Training Program | Admitting: Student in an Organized Health Care Education/Training Program

## 2016-03-06 ENCOUNTER — Encounter (HOSPITAL_COMMUNITY): Payer: Self-pay | Admitting: *Deleted

## 2016-03-06 DIAGNOSIS — L89142 Pressure ulcer of left lower back, stage 2: Secondary | ICD-10-CM | POA: Insufficient documentation

## 2016-03-06 DIAGNOSIS — E1122 Type 2 diabetes mellitus with diabetic chronic kidney disease: Secondary | ICD-10-CM | POA: Diagnosis not present

## 2016-03-06 DIAGNOSIS — N183 Chronic kidney disease, stage 3 unspecified: Secondary | ICD-10-CM

## 2016-03-06 DIAGNOSIS — M6688 Spontaneous rupture of other tendons, other: Secondary | ICD-10-CM | POA: Diagnosis not present

## 2016-03-06 DIAGNOSIS — Z681 Body mass index (BMI) 19 or less, adult: Secondary | ICD-10-CM | POA: Diagnosis not present

## 2016-03-06 DIAGNOSIS — I4819 Other persistent atrial fibrillation: Secondary | ICD-10-CM

## 2016-03-06 DIAGNOSIS — L98429 Non-pressure chronic ulcer of back with unspecified severity: Secondary | ICD-10-CM | POA: Insufficient documentation

## 2016-03-06 DIAGNOSIS — I130 Hypertensive heart and chronic kidney disease with heart failure and stage 1 through stage 4 chronic kidney disease, or unspecified chronic kidney disease: Secondary | ICD-10-CM | POA: Insufficient documentation

## 2016-03-06 DIAGNOSIS — L8992 Pressure ulcer of unspecified site, stage 2: Secondary | ICD-10-CM | POA: Diagnosis present

## 2016-03-06 DIAGNOSIS — I4891 Unspecified atrial fibrillation: Secondary | ICD-10-CM | POA: Diagnosis present

## 2016-03-06 DIAGNOSIS — M25552 Pain in left hip: Secondary | ICD-10-CM | POA: Insufficient documentation

## 2016-03-06 DIAGNOSIS — E1140 Type 2 diabetes mellitus with diabetic neuropathy, unspecified: Secondary | ICD-10-CM | POA: Diagnosis not present

## 2016-03-06 DIAGNOSIS — Z85828 Personal history of other malignant neoplasm of skin: Secondary | ICD-10-CM | POA: Diagnosis not present

## 2016-03-06 DIAGNOSIS — R531 Weakness: Secondary | ICD-10-CM | POA: Diagnosis not present

## 2016-03-06 DIAGNOSIS — I2510 Atherosclerotic heart disease of native coronary artery without angina pectoris: Secondary | ICD-10-CM | POA: Diagnosis not present

## 2016-03-06 DIAGNOSIS — I252 Old myocardial infarction: Secondary | ICD-10-CM | POA: Diagnosis not present

## 2016-03-06 DIAGNOSIS — Z7901 Long term (current) use of anticoagulants: Secondary | ICD-10-CM

## 2016-03-06 DIAGNOSIS — E440 Moderate protein-calorie malnutrition: Secondary | ICD-10-CM | POA: Diagnosis not present

## 2016-03-06 DIAGNOSIS — E11621 Type 2 diabetes mellitus with foot ulcer: Secondary | ICD-10-CM | POA: Diagnosis not present

## 2016-03-06 DIAGNOSIS — I480 Paroxysmal atrial fibrillation: Principal | ICD-10-CM | POA: Diagnosis present

## 2016-03-06 DIAGNOSIS — Z7952 Long term (current) use of systemic steroids: Secondary | ICD-10-CM | POA: Diagnosis not present

## 2016-03-06 DIAGNOSIS — D649 Anemia, unspecified: Secondary | ICD-10-CM | POA: Diagnosis not present

## 2016-03-06 DIAGNOSIS — I5022 Chronic systolic (congestive) heart failure: Secondary | ICD-10-CM

## 2016-03-06 DIAGNOSIS — I5032 Chronic diastolic (congestive) heart failure: Secondary | ICD-10-CM | POA: Diagnosis not present

## 2016-03-06 DIAGNOSIS — M25559 Pain in unspecified hip: Secondary | ICD-10-CM

## 2016-03-06 DIAGNOSIS — K754 Autoimmune hepatitis: Secondary | ICD-10-CM | POA: Diagnosis present

## 2016-03-06 DIAGNOSIS — E860 Dehydration: Secondary | ICD-10-CM | POA: Diagnosis not present

## 2016-03-06 DIAGNOSIS — R52 Pain, unspecified: Secondary | ICD-10-CM

## 2016-03-06 DIAGNOSIS — D72829 Elevated white blood cell count, unspecified: Secondary | ICD-10-CM | POA: Diagnosis not present

## 2016-03-06 LAB — CBC WITH DIFFERENTIAL/PLATELET
Basophils Absolute: 0 10*3/uL (ref 0.0–0.1)
Basophils Relative: 0 %
EOS ABS: 0 10*3/uL (ref 0.0–0.7)
EOS PCT: 0 %
HCT: 27.6 % — ABNORMAL LOW (ref 36.0–46.0)
Hemoglobin: 8.7 g/dL — ABNORMAL LOW (ref 12.0–15.0)
LYMPHS ABS: 1.3 10*3/uL (ref 0.7–4.0)
LYMPHS PCT: 5 %
MCH: 27.4 pg (ref 26.0–34.0)
MCHC: 31.5 g/dL (ref 30.0–36.0)
MCV: 87.1 fL (ref 78.0–100.0)
MONO ABS: 1 10*3/uL (ref 0.1–1.0)
MONOS PCT: 4 %
Neutro Abs: 22 10*3/uL — ABNORMAL HIGH (ref 1.7–7.7)
Neutrophils Relative %: 91 %
PLATELETS: 336 10*3/uL (ref 150–400)
RBC: 3.17 MIL/uL — ABNORMAL LOW (ref 3.87–5.11)
RDW: 20.1 % — AB (ref 11.5–15.5)
WBC: 24.3 10*3/uL — ABNORMAL HIGH (ref 4.0–10.5)

## 2016-03-06 LAB — COMPREHENSIVE METABOLIC PANEL
ALBUMIN: 2 g/dL — AB (ref 3.5–5.0)
ALT: 12 U/L — AB (ref 14–54)
AST: 17 U/L (ref 15–41)
Alkaline Phosphatase: 104 U/L (ref 38–126)
Anion gap: 7 (ref 5–15)
BUN: 23 mg/dL — AB (ref 6–20)
CHLORIDE: 97 mmol/L — AB (ref 101–111)
CO2: 33 mmol/L — ABNORMAL HIGH (ref 22–32)
CREATININE: 1 mg/dL (ref 0.44–1.00)
Calcium: 8.4 mg/dL — ABNORMAL LOW (ref 8.9–10.3)
GFR calc Af Amer: >60 mL/min (ref >60)
GFR, EST NON AFRICAN AMERICAN: 52 mL/min — AB (ref >60)
GLUCOSE: 89 mg/dL (ref 65–99)
POTASSIUM: 4.7 mmol/L (ref 3.5–5.1)
Sodium: 137 mmol/L (ref 135–145)
Total Bilirubin: 0.5 mg/dL (ref 0.3–1.2)
Total Protein: 4.6 g/dL — ABNORMAL LOW (ref 6.5–8.1)

## 2016-03-06 LAB — BRAIN NATRIURETIC PEPTIDE: B Natriuretic Peptide: 1517.6 pg/mL — ABNORMAL HIGH (ref 0.0–100.0)

## 2016-03-06 MED ORDER — FENTANYL CITRATE (PF) 100 MCG/2ML IJ SOLN
25.0000 ug | Freq: Once | INTRAMUSCULAR | Status: AC
  Administered 2016-03-07: 25 ug via INTRAVENOUS
  Filled 2016-03-06: qty 2

## 2016-03-06 MED ORDER — DEXTROSE 5 % IV SOLN
5.0000 mg/h | Freq: Once | INTRAVENOUS | Status: AC
Start: 2016-03-06 — End: 2016-03-07
  Administered 2016-03-07: 5 mg/h via INTRAVENOUS
  Filled 2016-03-06: qty 100

## 2016-03-06 NOTE — ED Notes (Signed)
EDP at bedside  

## 2016-03-06 NOTE — ED Provider Notes (Signed)
[facility] DEPT Provider Note   CSN: [id number] Arrival date & time:     First Provider Contact:  None       History   Chief Complaint Chief Complaint  Patient presents with  . Atrial Fibrillation    HPI Cynthia Hicks is a 82 y.o. female.  HPI  Patient history of A. fib with recent admission for undetermined origin of leukocytosis presents via EMS from nursing home for A. fib. Poorly she had a couple episodes of emesis today; she had a vital signs taken at rehabilitation and he noticed her heart to be regular. She denies any complaints at present. She denies any fevers chest pain and SOB  Past Medical History:  Diagnosis Date  . Anemia 02/07/2016   REQUIRING A BLOOD TRANSFUSION  . Arthritis   . Autoimmune liver disease    a.  Followed @ [facility] - on chronic prednisone and  sirolimus  . Bulging disc   . Cellulitis    a. chronic cellulitis of LE  . Chronic acquired lymphedema   . Chronic back pain    a.  On Methadone  . Chronic diastolic CHF (congestive heart failure) ([place])    a. 06/2014 TEE: EF 60-65%, Gr 2 DD, mild MR, mildly dil LA, Grade IV descending thoracic Ao plaque.  . CKD (chronic kidney disease), stage IV ([place])   . Coronary artery disease    a.  MI 01/2003;  b.  02/2006 NSTEMI - LAD 80-11m, LCX nl, RCA 87m - RCA stented w/ Taxus DES;   c.  2009 Neg MV;  d. 11/2010 Echo - EF 65%;  e. 02/2013 MV: Carlton Adam Cardiolite that was low risk with no ischemia.  . Diabetes mellitus   . Diabetic neuropathy ([place])   . Esophageal stricture    narrowing  . Hyperlipidemia    a. inability to stake statins due to liver disease  . Hypertension   . PAD (peripheral artery disease) ([place])   . PAF (paroxysmal atrial fibrillation) ([place])    a. Tikosyn initiated 10/2014;  CHA2DS2VASc = 6 (eliquis).  . Sciatica   . Sepsis ([place])   . Skin cancer (melanoma) [facility])     Patient Active Problem List   Diagnosis Date Noted  . Palliative care encounter   . Goals of care,  counseling/discussion   . Hypotension 02/24/2016  . Leukocytosis 02/24/2016  . Pressure ulcer stage II 02/08/2016  . Bradycardia 02/07/2016  . Absolute anemia   . Anemia 01/26/2016  . Chest pain 01/19/2016  . PAF (paroxysmal atrial fibrillation) ([place])   . Hyperlipidemia   . PAD (peripheral artery disease) ([place])   . Chronic acquired lymphedema   . Septic arthritis of knee ([place]) 01/09/2016  . Malnutrition of moderate degree 01/08/2016  . Pressure ulcer 01/08/2016  . Sepsis due to methicillin resistant Staphylococcus aureus ([place]) 06/08/2015  . Visit for monitoring Tikosyn therapy   . CKD (chronic kidney disease), stage III 06/24/2014  . Acute on chronic diastolic CHF (congestive heart failure) ([place])   . Diabetes mellitus type 2, uncontrolled ([place])   . Polyneuropathy in diabetes(357.2) 03/04/2014  . Localized, primary osteoarthritis of shoulder region 11/02/2013  . Chronic diastolic CHF (congestive heart failure) ([place]) 05/02/2013  . Biliary calculi, common bile duct 02/28/2013  . Fungal infection of nail 02/28/2013  . H/O immunosuppressive therapy 02/28/2013  . Long term (current) use of anticoagulants 10/31/2012  . Swelling of limb 10/22/2012  . Pain in limb 10/22/2012  . Lymphedema 10/22/2012  . Arteriosclerosis  of coronary artery 04/04/2012  . Encounter for long-term (current) use of steroids 04/04/2012  . Diabetes mellitus, type 2 ([place]) 04/04/2012  . HLD (hyperlipidemia) 04/04/2012  . Disease of liver 04/04/2012  . Heart attack ([place]) 04/04/2012  . Long term current use of systemic steroids 04/04/2012  . Abnormal abdominal MRI 03/03/2012  . Abdominal pain 12/04/2011  . NSTEMI (non-ST elevated myocardial infarction) ([place]) 08/30/2011  . Coronary artery disease   . Chronic back pain   . Chronic kidney disease   . Autoimmune liver disease   . Carotid bruit 07/02/2011  . Hypoxemia 11/17/2010  . Palpitations 11/06/2010  . OTHER NONINFECTIOUS DISORDERS LYMPHATIC CHANNELS  07/21/2009  . Diabetes mellitus ([place]) 07/07/2009  . Anemia of chronic disease 07/07/2009  . HTN (hypertension) 07/07/2009  . GERD 07/07/2009  . Autoimmune hepatitis ([place]) 07/07/2009  . DEGENERATIVE DISC DISEASE 07/07/2009  . PSOAS MUSCLE ABSCESS 07/05/2009  . HYPERLIPIDEMIA-MIXED 01/02/2009  . Chronic venous insufficiency 01/02/2009    Past Surgical History:  Procedure Laterality Date  . ABSCESS DRAINAGE Right    side  . CARDIAC CATHETERIZATION     Normal EF  . CARDIOVERSION N/A 10/31/2012   Procedure: CARDIOVERSION;  Surgeon: Larey Dresser, MD;  Location: [facility];  Service: Cardiovascular;  Laterality: N/A;  . CORONARY ANGIOPLASTY  2007   2 DES placement to RCA complicated by a wire perforation  . ELBOW SURGERY Left 1994   neuroma removed   . Heart Stents  2007  and  2012   X's 1  and  2012 X's 2 stents  . KNEE ARTHROSCOPY Bilateral 01/08/2016   Procedure: ARTHROSCOPY KNEE;  Surgeon: Newt Minion, MD;  Location: [facility];  Service: Orthopedics;  Laterality: Bilateral;  . lamenectomy  1989   only 1 disc surgery  . LEFT HEART CATHETERIZATION WITH CORONARY ANGIOGRAM N/A 08/29/2011   Procedure: LEFT HEART CATHETERIZATION WITH CORONARY ANGIOGRAM;  Surgeon: Wellington Hampshire, MD;  Location: [facility] CATH LAB;  Service: Cardiovascular;  Laterality: N/A;  . PERCUTANEOUS CORONARY STENT INTERVENTION (PCI-S)  08/29/2011   Procedure: PERCUTANEOUS CORONARY STENT INTERVENTION (PCI-S);  Surgeon: Wellington Hampshire, MD;  Location: [facility] C[facility] LAB;  Service: Cardiovascular;;  . TEE WITHOUT CARDIOVERSION N/A 06/22/2014   Procedure: TRANSESOPHAGEAL ECHOCARDIOGRAM (TEE);  Surgeon: Larey Dresser, MD;  Location: [facility];  Service: Cardiovascular;  Laterality: N/A;  . TUBAL LIGATION  1982    OB History    No data available       Home Medications    Prior to Admission medications   Medication Sig Start Date End Date Taking? Authorizing Provider  acetaminophen (TYLENOL) 325 MG tablet Take 2  tablets (650 mg total) by mouth every 6 (six) hours as needed for mild pain (or Fever >/= 101). 01/10/16  Yes Liberty Handy, MD  alendronate (FOSAMAX) 70 MG tablet Take 70 mg by mouth every Monday. Take with a full glass of water on an empty stomach.   Yes Historical Provider, MD  anti-nausea (EMETROL) solution Take 15 mLs by mouth every 15 (fifteen) minutes as needed for nausea or vomiting.   Yes Historical Provider, MD  apixaban (ELIQUIS) 2.5 MG TABS tablet Take 1 tablet (2.5 mg total) by mouth 2 (two) times daily. 03/07/15  Yes Larey Dresser, MD  Ascorbic Acid (VITAMIN C) 1000 MG tablet Take 1,000 mg by mouth 2 (two) times daily.    Yes Historical Provider, MD  bisoprolol (ZEBETA) 5 MG tablet Take 0.5 tablets (2.5 mg total) by mouth daily.  02/29/16  Yes Asencion Partridge, MD  Cholecalciferol (VITAMIN D3) 2000 units TABS Take 1 tablet by mouth at bedtime.   Yes Historical Provider, MD  clotrimazole (MYCELEX) 10 MG troche Take 10 mg by mouth 5 (five) times daily.   Yes Historical Provider, MD  ferrous gluconate (FERGON) 240 (27 FE) MG tablet Take 240 mg by mouth daily.   Yes Historical Provider, MD  fluticasone (FLONASE) 50 MCG/ACT nasal spray Place 1 spray into both nostrils at bedtime.   Yes Historical Provider, MD  furosemide (LASIX) 40 MG tablet Take 40 mg by mouth every morning.   Yes Historical Provider, MD  insulin regular (HUMULIN R) 100 units/mL injection Inject 4-16 Units into the skin 3 (three) times daily before meals. Per sliding scale: BGL 120-200 = 4 units; 201-250 = 6 units; 251-300 = 8 units; 301-350 = 10 units; 351-400 = 14 units; 401-450 = 16 units   Yes Historical Provider, MD  magnesium hydroxide (MILK OF MAGNESIA) 400 MG/5ML suspension Take 30 mLs by mouth every 6 (six) hours as needed for mild constipation.   Yes Historical Provider, MD  morphine (MS CONTIN) 60 MG 12 hr tablet Take 1 tablet (60 mg total) by mouth every 12 (twelve) hours. 02/29/16  Yes Asencion Partridge, MD  Multiple Vitamin  (MULTIVITAMIN) tablet Take 1 tablet by mouth daily.   Yes Historical Provider, MD  nitroGLYCERIN (NITROSTAT) 0.4 MG SL tablet Place 1 tablet (0.4 mg total) under the tongue every 5 (five) minutes as needed for chest pain. 09/01/11  Yes Rogelia Mire, NP  omeprazole (PRILOSEC) 20 MG capsule Take 20 mg by mouth daily.  06/06/14  Yes Historical Provider, MD  ondansetron (ZOFRAN) 8 MG tablet Take 8 mg by mouth every 8 (eight) hours as needed for nausea or vomiting.   Yes Historical Provider, MD  polyethylene glycol (MIRALAX / GLYCOLAX) packet Take 17 g by mouth daily as needed for mild constipation or moderate constipation. 02/09/16  Yes Asencion Partridge, MD  polyvinyl alcohol (LIQUIFILM TEARS) 1.4 % ophthalmic solution Place 1 drop into both eyes daily as needed for dry eyes.   Yes Historical Provider, MD  pravastatin (PRAVACHOL) 80 MG tablet Take 1 tablet (80 mg total) by mouth every evening. 10/11/14  Yes Larey Dresser, MD  predniSONE (DELTASONE) 10 MG tablet Take 1 tablet (10 mg total) by mouth daily with breakfast. 02/29/16  Yes Asencion Partridge, MD  senna (SENOKOT) 8.6 MG TABS tablet Take 1 tablet (8.6 mg total) by mouth at bedtime as needed for moderate constipation. Patient taking differently: Take 1 tablet by mouth at bedtime as needed for mild constipation or moderate constipation.  02/09/16  Yes Asencion Partridge, MD  traMADol (ULTRAM) 50 MG tablet Take 1 tablet (50 mg total) by mouth every 6 (six) hours as needed for severe pain. 02/29/16  Yes Asencion Partridge, MD  vitamin B-12 (CYANOCOBALAMIN) 1000 MCG tablet Take 1,000 mcg by mouth every evening.   Yes Historical Provider, MD  collagenase (SANTYL) ointment Apply topically daily. Patient not taking: Reported on 03/06/2016 02/09/16   Asencion Partridge, MD  diclofenac sodium (VOLTAREN) 1 % GEL Apply 2 g topically 4 (four) times daily. Patient not taking: Reported on 03/06/2016 02/29/16   Asencion Partridge, MD    Family History Family History  Problem Relation Age of Onset    . Lung cancer Mother 27  . Pancreatic cancer Mother   . Heart attack Father 28  . Heart disease Father   . Congestive Heart Failure  Father   . Heart disease Maternal Grandfather   . Hypertension Brother   . Hypertension Son     Social History Social History  Substance Use Topics  . Smoking status: Never Smoker  . Smokeless tobacco: Never Used  . Alcohol use No     Allergies   Cyclosporine; Azathioprine; Mycophenolate; Other; and Shellfish allergy   Review of Systems Review of Systems  Constitutional: Negative for chills and fever.  HENT: Negative for ear pain and sore throat.   Eyes: Negative for pain and visual disturbance.  Respiratory: Negative for cough and shortness of breath.   Cardiovascular: Negative for chest pain and palpitations.  Gastrointestinal: Positive for nausea. Negative for abdominal pain and vomiting.  Genitourinary: Negative for dysuria and hematuria.  Musculoskeletal: Negative for arthralgias and back pain.  Skin: Negative for color change and rash.  Neurological: Negative for seizures and syncope.  All other systems reviewed and are negative.    Physical Exam Updated Vital Signs BP 109/56   Pulse 118   Temp 97.8 F (36.6 C) (Oral)   Resp 17   SpO2 100%   Physical Exam  Constitutional: She appears well-developed and well-nourished. No distress.  HENT:  Head: Normocephalic and atraumatic.  Eyes: Conjunctivae are normal.  Neck: Neck supple.  Cardiovascular: An irregularly irregular rhythm present. Tachycardia present.   No murmur heard. Pulmonary/Chest: Effort normal and breath sounds normal. No respiratory distress.  Abdominal: Soft. There is no tenderness.  Musculoskeletal: She exhibits edema and tenderness.  Neurological: She is alert.  Skin: Skin is warm and dry.  Psychiatric: She has a normal mood and affect.  Nursing note and vitals reviewed.    ED Treatments / Results  Labs (all labs ordered are listed, but only abnormal  results are displayed) Labs Reviewed  BRAIN NATRIURETIC PEPTIDE - Abnormal; Notable for the following:       Result Value   B Natriuretic Peptide 1,517.6 (*)    All other components within normal limits  COMPREHENSIVE METABOLIC PANEL - Abnormal; Notable for the following:    Chloride 97 (*)    CO2 33 (*)    BUN 23 (*)    Calcium 8.4 (*)    Total Protein 4.6 (*)    Albumin 2.0 (*)    ALT 12 (*)    GFR calc non Af Amer 52 (*)    All other components within normal limits  CBC WITH DIFFERENTIAL/PLATELET - Abnormal; Notable for the following:    WBC 24.3 (*)    RBC 3.17 (*)    Hemoglobin 8.7 (*)    HCT 27.6 (*)    RDW 20.1 (*)    Neutro Abs 22.0 (*)    All other components within normal limits    EKG  EKG Interpretation  Date/Time:  Tuesday March 06 2016 19:09:05 EDT Ventricular Rate:  115 PR Interval:    QRS Duration: 90 QT Interval:  330 QTC Calculation: 456 R Axis:   -98 Text Interpretation:  Atrial fibrillation with rapid ventricular response Right superior axis deviation ST & T wave abnormality, consider lateral ischemia or digitalis effect Abnormal ECG Confirmed by Christy Gentles  MD, DONALD ([id number]) on 03/06/2016 7:24:47 PM       Radiology Dg Chest 1 View  Result Date: 03/06/2016 CLINICAL DATA:  82 year old female with atrial fibrillation 1 day and shortness of Breath. Initial encounter. EXAM: CHEST 1 VIEW COMPARISON:  Chest radiographs 02/24/2016. FINDINGS: Sitting AP upright view at 2129 hours. Left pleural effusion appears regressed  since July. Right pleural effusion appears stable. Stable cardiomegaly and mediastinal contours. No pneumothorax. No pulmonary edema. No consolidation. Stable visualized osseous structures. IMPRESSION: 1. Small to moderate right pleural effusion appears stable since July. 2. Left pleural effusion appears regressed with improved left lung base ventilation. 3. No new cardiopulmonary abnormality. Electronically Signed   By: Genevie Ann M.D.   On:  03/06/2016 21:37    Procedures Procedures (including critical care time)  Medications Ordered in ED Medications  diltiazem (CARDIZEM) 100 mg in dextrose 5 % 100 mL (1 mg/mL) infusion (not administered)  fentaNYL (SUBLIMAZE) injection 25 mcg (not administered)     Initial Impression / Assessment and Plan / ED Course  I have reviewed the triage vital signs and the nursing notes.  Pertinent labs & imaging results that were available during my care of the patient were reviewed by me and considered in my medical decision making (see chart for details).  Clinical Course    EKG her consistent with A. fib with RVR. Labs ordered did show a leukocytosis. Similar to prior admission. Patient was then infectious symptoms, unsure of the origin of this with acidosis. No fevers to suggest infection. CMP overall unremarkable. BNP is elevated, but clinical picture does not suggest CHF exacerbation. Chest x-ray shows similar findings to prior.  Patient started on diltiazem here for A. fib RVR. Patient be admitted to internal medicine for further care.  Final Clinical Impressions(s) / ED Diagnoses   Final diagnoses:  Atrial fibrillation with RVR [facility])    New Prescriptions New Prescriptions   No medications on file     Levada Schilling, MD 03/06/16 2321    Ripley Fraise, MD 03/06/16 2333

## 2016-03-06 NOTE — H&P (Signed)
Date: 03/06/2016               Patient Name:  Cynthia Hicks MRN: [id number]  DOB: October 17, 1932 Age / Sex: 82 y.o., female   PCP: Raelyn Number, MD         Medical Service: Internal Medicine Teaching Service         Attending Physician: Dr. Axel Filler, MD    First Contact: Dr. Ophelia Shoulder, MD Pager: [phone]  Second Contact: Dr. Dellia Nims Pager: [phone]       After Hours (After 5p/  First Contact Pager: [phone]  weekends / holidays): Second Contact Pager: [phone]   Chief Complaint: atrial fibrilation  History of Present Illness: This is an 82 year old female with history of paroxysmal atrial fibrillation and chronic diastolic CHF, who presents from [facility] for evaluation of atrial fibrillation. The patient presents with her son who also contributed to the HPI. The patient reports that she has not been feeling well in "a long time" and has been feeling more fatigued than usual. She states that today she had some episodes of vomiting. Her son reports that she has been going in and out of atrial fibrillation for some time now and does not tolerate it well. She admits to fatigue, nausea, vomiting and 1 lose stool this morning. She also reports some chest pain yesterday that was relieved with nitro.The patient currently denies any fevers, chills, SOB, chest pain or abdominal pain.   Meds:   (Not in a hospital admission)   Allergies: Allergies as of 03/06/2016 - Review Complete 03/06/2016  Allergen Reaction Noted  . Cyclosporine  06/18/2014  . Azathioprine Other (See Comments) 03/03/2014  . Mycophenolate Other (See Comments) 06/23/2014  . Other  03/03/2014  . Shellfish allergy Nausea And Vomiting 10/19/2014   Past Medical History:  Diagnosis Date  . Anemia 02/07/2016   REQUIRING A BLOOD TRANSFUSION  . Arthritis   . Autoimmune liver disease    a.  Followed @ [facility] - on chronic prednisone and  sirolimus  . Bulging disc   . Cellulitis    a.  chronic cellulitis of LE  . Chronic acquired lymphedema   . Chronic back pain    a.  On Methadone  . Chronic diastolic CHF (congestive heart failure) ([place])    a. 06/2014 TEE: EF 60-65%, Gr 2 DD, mild MR, mildly dil LA, Grade IV descending thoracic Ao plaque.  . CKD (chronic kidney disease), stage IV ([place])   . Coronary artery disease    a.  MI 01/2003;  b.  02/2006 NSTEMI - LAD 80-4m LCX nl, RCA 941m RCA stented w/ Taxus DES;   c.  2009 Neg MV;  d. 11/2010 Echo - EF 65%;  e. 02/2013 MV: LeCarlton Adamardiolite that was low risk with no ischemia.  . Diabetes mellitus   . Diabetic neuropathy ([place]  . Esophageal stricture    narrowing  . Hyperlipidemia    a. inability to stake statins due to liver disease  . Hypertension   . PAD (peripheral artery disease) ([place]  . PAF (paroxysmal atrial fibrillation) ([facility]   a. Tikosyn initiated 10/2014;  CHA2DS2VASc = 6 (eliquis).  . Sciatica   . Sepsis ([facility]  . Skin cancer (melanoma) ([facility]    Family History:  Mother: lung and pancreatic cancer Father: MI, CHF  Social History:  Never used tobacco products. Denies alcohol and drug use.   Review  of Systems: A complete ROS was negative except as per HPI.   Physical Exam: Blood pressure 109/56, pulse 118, temperature 97.8 F (36.6 C), temperature source Oral, resp. rate 17, SpO2 100 %.  General: Awake, alert, frail and chronically-ill appearing patient Cardio: Irregularly irregular, rate 112, normal heart sounds Lungs: CTA BL Extremities: BL LE pitting edema present. Chronic venous stasis changes present Abdomen: soft, non-tender. Edema present Mouth: Moist mucous membranes, oropharynx clear.     EKG:   EKG Interpretation  Date/Time:  Tuesday March 06 2016 19:09:05 EDT Ventricular Rate:  115 PR Interval:    QRS Duration: 90 QT Interval:  330 QTC Calculation: 456 R Axis:   -98 Text Interpretation:  Atrial fibrillation with rapid ventricular response Right superior axis deviation ST &  T wave abnormality, consider lateral ischemia or digitalis effect Abnormal ECG Confirmed by Christy Gentles  MD, DONALD ([id number]) on 03/06/2016 7:24:47 PM        CXR:  1-view chest shows small-mod R pleural effusion, stable since July. Also shows improving left pleural effusion. No new cp abnormality.  Assessment & Plan by Problem:  1. Atrial fibrillation with RVR: Patient had runs of afib with RVR while in ED. Cardizem given in ED. Patients Afib has responded in the past with fluids, however the patient appears volume overloaded at this time. Pressures stable, creatinine stable at this time.  Due to patients history of chronic CHF and hypoalbuminemia, fluids held at this time.  CHADS2vasc score 7. Patient anticoagulated on Eliquis 2.52m BID. Patient on Bisoprolol 2.568mdaily. Consider increasing Bisoprolol if BP can handle.  Patient on cardizem drip at this time.  2. Chronic Diastolic CHF:  BNP elevated. 1 view chest showed stable or improving pleural effusions, however ordered a lateral view for further evaluation. Will hold off on diuresing at this time.   3. Leukocytosis: Labs show leukocytosis, similar to prior admissions. CXR did not show new pulmonary infiltrate. Patient is on chronic steroids for autoimmune liver disease. In prior admission workup was done without infectious etiology found.   4. CKD Stage 3: Patients Creatinine currently 1.0 and eGFR 52, improved from baseline. Will continue to monitor.  5. HTN: Blood pressures stable. Will continue to monitor.   6. Diabetes Mellitus: Patient started on Lantus 5units + sensitive sliding scale.     Dispo: Admit patient to Inpatient with expected length of stay greater than 2 midnights.  Signed: Einar GipDO 03/06/2016, 11:42 PM  Pager: [phone]

## 2016-03-06 NOTE — ED Triage Notes (Signed)
Pt arrives from [facility] via [facility]. Nursing home staff reports pt has been in afib for 1 day and states she has been having sob. Pt has had episodes of afib over the past year and doesn't tolerate well per snf staff.

## 2016-03-07 ENCOUNTER — Inpatient Hospital Stay (HOSPITAL_COMMUNITY): Payer: Medicare Other

## 2016-03-07 DIAGNOSIS — I4891 Unspecified atrial fibrillation: Secondary | ICD-10-CM | POA: Diagnosis present

## 2016-03-07 DIAGNOSIS — J90 Pleural effusion, not elsewhere classified: Secondary | ICD-10-CM | POA: Diagnosis not present

## 2016-03-07 DIAGNOSIS — D72829 Elevated white blood cell count, unspecified: Secondary | ICD-10-CM | POA: Diagnosis not present

## 2016-03-07 DIAGNOSIS — E46 Unspecified protein-calorie malnutrition: Secondary | ICD-10-CM

## 2016-03-07 DIAGNOSIS — Z8619 Personal history of other infectious and parasitic diseases: Secondary | ICD-10-CM

## 2016-03-07 DIAGNOSIS — I5032 Chronic diastolic (congestive) heart failure: Secondary | ICD-10-CM

## 2016-03-07 DIAGNOSIS — I130 Hypertensive heart and chronic kidney disease with heart failure and stage 1 through stage 4 chronic kidney disease, or unspecified chronic kidney disease: Secondary | ICD-10-CM

## 2016-03-07 DIAGNOSIS — Z7901 Long term (current) use of anticoagulants: Secondary | ICD-10-CM

## 2016-03-07 DIAGNOSIS — N183 Chronic kidney disease, stage 3 (moderate): Secondary | ICD-10-CM

## 2016-03-07 DIAGNOSIS — Z794 Long term (current) use of insulin: Secondary | ICD-10-CM

## 2016-03-07 DIAGNOSIS — K754 Autoimmune hepatitis: Secondary | ICD-10-CM

## 2016-03-07 DIAGNOSIS — R112 Nausea with vomiting, unspecified: Secondary | ICD-10-CM | POA: Diagnosis not present

## 2016-03-07 DIAGNOSIS — E1122 Type 2 diabetes mellitus with diabetic chronic kidney disease: Secondary | ICD-10-CM

## 2016-03-07 DIAGNOSIS — Z7952 Long term (current) use of systemic steroids: Secondary | ICD-10-CM

## 2016-03-07 LAB — FOLATE: FOLATE: 26.8 ng/mL (ref >5.9)

## 2016-03-07 LAB — IRON AND TIBC
Iron: 33 ug/dL (ref 28–170)
SATURATION RATIOS: 26 % (ref 10.4–31.8)
TIBC: 127 ug/dL — AB (ref 250–450)
UIBC: 94 ug/dL

## 2016-03-07 LAB — CBC WITH DIFFERENTIAL/PLATELET
Basophils Absolute: 0 10*3/uL (ref 0.0–0.1)
Basophils Relative: 0 %
Eosinophils Absolute: 0 10*3/uL (ref 0.0–0.7)
Eosinophils Relative: 0 %
HCT: 27.5 % — ABNORMAL LOW (ref 36.0–46.0)
HEMOGLOBIN: 8.3 g/dL — AB (ref 12.0–15.0)
LYMPHS ABS: 0.7 10*3/uL (ref 0.7–4.0)
Lymphocytes Relative: 4 %
MCH: 26.9 pg (ref 26.0–34.0)
MCHC: 30.2 g/dL (ref 30.0–36.0)
MCV: 89.3 fL (ref 78.0–100.0)
MONO ABS: 1.1 10*3/uL — AB (ref 0.1–1.0)
Monocytes Relative: 6 %
NEUTROS ABS: 15.7 10*3/uL — AB (ref 1.7–7.7)
NEUTROS PCT: 90 %
Platelets: 318 10*3/uL (ref 150–400)
RBC: 3.08 MIL/uL — AB (ref 3.87–5.11)
RDW: 20.7 % — ABNORMAL HIGH (ref 11.5–15.5)
WBC: 17.5 10*3/uL — ABNORMAL HIGH (ref 4.0–10.5)

## 2016-03-07 LAB — VITAMIN B12: VITAMIN B 12: 1650 pg/mL — AB (ref 180–914)

## 2016-03-07 LAB — BASIC METABOLIC PANEL
ANION GAP: 8 (ref 5–15)
BUN: 22 mg/dL — ABNORMAL HIGH (ref 6–20)
CALCIUM: 8.1 mg/dL — AB (ref 8.9–10.3)
CO2: 33 mmol/L — AB (ref 22–32)
Chloride: 98 mmol/L — ABNORMAL LOW (ref 101–111)
Creatinine, Ser: 1.07 mg/dL — ABNORMAL HIGH (ref 0.44–1.00)
GFR, EST AFRICAN AMERICAN: 55 mL/min — AB (ref >60)
GFR, EST NON AFRICAN AMERICAN: 48 mL/min — AB (ref >60)
Glucose, Bld: 147 mg/dL — ABNORMAL HIGH (ref 65–99)
Potassium: 4.3 mmol/L (ref 3.5–5.1)
Sodium: 139 mmol/L (ref 135–145)

## 2016-03-07 LAB — GLUCOSE, CAPILLARY
GLUCOSE-CAPILLARY: 87 mg/dL (ref 65–99)
GLUCOSE-CAPILLARY: 120 mg/dL — AB (ref 65–99)
GLUCOSE-CAPILLARY: 367 mg/dL — AB (ref 65–99)
GLUCOSE-CAPILLARY: 171 mg/dL — AB (ref 65–99)

## 2016-03-07 LAB — SEDIMENTATION RATE: Sed Rate: 30 mm/hr — ABNORMAL HIGH (ref 0–22)

## 2016-03-07 LAB — CBC
HCT: 27.8 % — ABNORMAL LOW (ref 36.0–46.0)
HEMOGLOBIN: 8.4 g/dL — AB (ref 12.0–15.0)
MCH: 26.8 pg (ref 26.0–34.0)
MCHC: 30.2 g/dL (ref 30.0–36.0)
MCV: 88.8 fL (ref 78.0–100.0)
Platelets: 307 10*3/uL (ref 150–400)
RBC: 3.13 MIL/uL — AB (ref 3.87–5.11)
RDW: 20.4 % — ABNORMAL HIGH (ref 11.5–15.5)
WBC: 15.4 10*3/uL — ABNORMAL HIGH (ref 4.0–10.5)

## 2016-03-07 LAB — SAVE SMEAR

## 2016-03-07 LAB — TROPONIN I: TROPONIN I: 0.08 ng/mL — AB (ref <0.03)

## 2016-03-07 LAB — RETICULOCYTES
RBC.: 3.08 MIL/uL — AB (ref 3.87–5.11)
Retic Count, Absolute: 83.2 10*3/uL (ref 19.0–186.0)
Retic Ct Pct: 2.7 % (ref 0.4–3.1)

## 2016-03-07 LAB — FERRITIN: Ferritin: 1012 ng/mL — ABNORMAL HIGH (ref 11–307)

## 2016-03-07 LAB — C-REACTIVE PROTEIN: CRP: 15.2 mg/dL — ABNORMAL HIGH (ref <1.0)

## 2016-03-07 MED ORDER — FERROUS GLUCONATE 324 (38 FE) MG PO TABS
324.0000 mg | ORAL_TABLET | Freq: Every day | ORAL | Status: DC
  Administered 2016-03-07: 324 mg via ORAL
  Filled 2016-03-07: qty 1

## 2016-03-07 MED ORDER — INSULIN ASPART 100 UNIT/ML ~~LOC~~ SOLN: 0.0000 [IU] | Freq: Every day | SUBCUTANEOUS | Status: DC

## 2016-03-07 MED ORDER — MUPIROCIN CALCIUM 2 % EX CREA
TOPICAL_CREAM | Freq: Every day | CUTANEOUS | Status: DC
  Filled 2016-03-07: qty 15

## 2016-03-07 MED ORDER — SODIUM CHLORIDE 0.9% FLUSH
3.0000 mL | Freq: Two times a day (BID) | INTRAVENOUS | Status: DC
  Administered 2016-03-07 – 2016-03-08 (×3): 3 mL via INTRAVENOUS

## 2016-03-07 MED ORDER — SENNA 8.6 MG PO TABS: 1.0000 | ORAL_TABLET | Freq: Every evening | ORAL | Status: DC | PRN

## 2016-03-07 MED ORDER — APIXABAN 2.5 MG PO TABS
2.5000 mg | ORAL_TABLET | Freq: Two times a day (BID) | ORAL | Status: DC
  Administered 2016-03-07 – 2016-03-09 (×6): 2.5 mg via ORAL
  Filled 2016-03-07 (×6): qty 1

## 2016-03-07 MED ORDER — PRAVASTATIN SODIUM 40 MG PO TABS
80.0000 mg | ORAL_TABLET | Freq: Every evening | ORAL | Status: DC
  Administered 2016-03-07 – 2016-03-09 (×3): 80 mg via ORAL
  Filled 2016-03-07 (×3): qty 2

## 2016-03-07 MED ORDER — PANTOPRAZOLE SODIUM 40 MG PO TBEC
40.0000 mg | DELAYED_RELEASE_TABLET | Freq: Every day | ORAL | Status: DC
  Administered 2016-03-07 – 2016-03-09 (×3): 40 mg via ORAL
  Filled 2016-03-07 (×3): qty 1

## 2016-03-07 MED ORDER — INSULIN GLARGINE 100 UNIT/ML ~~LOC~~ SOLN
5.0000 [IU] | Freq: Every day | SUBCUTANEOUS | Status: DC
  Administered 2016-03-07: 5 [IU] via SUBCUTANEOUS
  Filled 2016-03-07 (×4): qty 0.05

## 2016-03-07 MED ORDER — ACETAMINOPHEN 325 MG PO TABS: 650.0000 mg | ORAL_TABLET | Freq: Four times a day (QID) | ORAL | Status: DC | PRN

## 2016-03-07 MED ORDER — TRAMADOL HCL 50 MG PO TABS
50.0000 mg | ORAL_TABLET | Freq: Four times a day (QID) | ORAL | Status: DC | PRN
  Administered 2016-03-07 – 2016-03-09 (×6): 50 mg via ORAL
  Filled 2016-03-07 (×7): qty 1

## 2016-03-07 MED ORDER — MORPHINE SULFATE ER 15 MG PO TBCR
60.0000 mg | EXTENDED_RELEASE_TABLET | Freq: Two times a day (BID) | ORAL | Status: DC
  Administered 2016-03-07 – 2016-03-09 (×5): 60 mg via ORAL
  Filled 2016-03-07 (×6): qty 4

## 2016-03-07 MED ORDER — VITAMIN D 1000 UNITS PO TABS
2000.0000 [IU] | ORAL_TABLET | Freq: Every day | ORAL | Status: DC
  Administered 2016-03-07: 2000 [IU] via ORAL
  Filled 2016-03-07: qty 2

## 2016-03-07 MED ORDER — EMETROL 1.87-1.87-21.5 PO SOLN: 15.0000 mL | ORAL | Status: DC | PRN

## 2016-03-07 MED ORDER — PREDNISONE 10 MG PO TABS
10.0000 mg | ORAL_TABLET | Freq: Every day | ORAL | Status: DC
  Administered 2016-03-07 – 2016-03-09 (×3): 10 mg via ORAL
  Filled 2016-03-07 (×3): qty 1

## 2016-03-07 MED ORDER — FLUTICASONE PROPIONATE 50 MCG/ACT NA SUSP
1.0000 | Freq: Every day | NASAL | Status: DC
  Administered 2016-03-07: 1 via NASAL
  Filled 2016-03-07: qty 16

## 2016-03-07 MED ORDER — POLYETHYLENE GLYCOL 3350 17 G PO PACK: 17.0000 g | PACK | Freq: Every day | ORAL | Status: DC | PRN

## 2016-03-07 MED ORDER — BISOPROLOL FUMARATE 5 MG PO TABS
5.0000 mg | ORAL_TABLET | Freq: Every day | ORAL | Status: DC
  Administered 2016-03-07 – 2016-03-09 (×3): 5 mg via ORAL
  Filled 2016-03-07 (×3): qty 1

## 2016-03-07 MED ORDER — FUROSEMIDE 40 MG PO TABS
40.0000 mg | ORAL_TABLET | Freq: Every morning | ORAL | Status: DC
  Administered 2016-03-07: 40 mg via ORAL
  Filled 2016-03-07: qty 1

## 2016-03-07 MED ORDER — ONDANSETRON HCL 4 MG PO TABS: 8.0000 mg | ORAL_TABLET | Freq: Three times a day (TID) | ORAL | Status: DC | PRN

## 2016-03-07 MED ORDER — INSULIN ASPART 100 UNIT/ML ~~LOC~~ SOLN
0.0000 [IU] | Freq: Three times a day (TID) | SUBCUTANEOUS | Status: DC
  Administered 2016-03-07: 9 [IU] via SUBCUTANEOUS
  Administered 2016-03-08: 5 [IU] via SUBCUTANEOUS
  Administered 2016-03-08 – 2016-03-09 (×2): 2 [IU] via SUBCUTANEOUS
  Administered 2016-03-09: 3 [IU] via SUBCUTANEOUS

## 2016-03-07 MED ORDER — BISOPROLOL FUMARATE 5 MG PO TABS: 2.5000 mg | ORAL_TABLET | Freq: Every day | ORAL | Status: DC

## 2016-03-07 MED ORDER — CLOTRIMAZOLE 10 MG MT TROC
10.0000 mg | Freq: Every day | OROMUCOSAL | Status: DC
  Administered 2016-03-07 – 2016-03-09 (×11): 10 mg via ORAL
  Filled 2016-03-07 (×16): qty 1

## 2016-03-07 NOTE — Consult Note (Signed)
[facility] Nurse wound consult note Reason for Consult: Consult requested for bilat feet and back wounds.   Wound type: Pt has curvature of the spine and there are areas of stage 2 pressure injuries where it is difficult to reduce pressure and shear, these were present on admission. 1X.2X.1cm and .8X.2X.1cm, red and dry, loose peeling skin surrounding sites, no odor or drainage.    Left outer foot with previous dry callous which was loose and removed easily, revealing full thickness wound; .3X.3X.2cm, dry yellow wound bed, no odor or drainage. Left heel which had a pressure injury noted on the previous admission, now pink dry peeling scar tissue without an open wound or drainage.  Right heel with dry callous; .5X.3cm, dry brown and raised above skin level, no open wound, odor or drainage.  This is NOT a pressure injury Right inner foot with dry callous; .3X.3cm, no open wound, odor or drainage. Dressing procedure/placement/frequency: Foam dressings to protect back wounds from further injury and promote healing. Bactroban to promote moist healing to left foot and right heel. Please re-consult if further assistance is needed.  Thank-you,  Julien Girt MSN, [place], [place], [place], [place]

## 2016-03-07 NOTE — Care Management CC44 (Signed)
Condition Code 44 Documentation Completed  Patient Details  Name: Cynthia Hicks MRN: [id number] Date of Birth: 11/30/1932   Condition Code 44 given:    Patient signature on Condition Code 44 notice:    Documentation of 2 MD's agreement:    Code 44 added to claim:       Royston Bake, RN 03/07/2016, 2:04 PM

## 2016-03-07 NOTE — Care Management Obs Status (Signed)
MEDICARE OBSERVATION STATUS NOTIFICATION   Patient Details  Name: Cynthia Hicks MRN: [id number] Date of Birth: 02/23/1933   Medicare Observation Status Notification Given:       Royston Bake, RN 03/07/2016, 2:05 PM

## 2016-03-07 NOTE — Progress Notes (Signed)
CRITICAL VALUE ALERT  Critical value received: [id number]  Date of notification:  03/07/16  Time of notification:  1520  Critical value read back: yes  Nurse who received alert:  Cato Mulligan RN   MD notified (1st page): Dr. Lovena Le  Time of first page:  1515  MD notified (2nd page):  Time of second page:  Responding MD:    Time MD responded:  1520

## 2016-03-07 NOTE — ED Notes (Signed)
Pt to xray

## 2016-03-07 NOTE — Progress Notes (Signed)
Subjective: No acute events overnight. Patient with left-sided hip pain and weakness. She denies chest pain, shortness of breath or palpitations. She had no additional acute complaints or concerns.  Objective:  Vital signs in last 24 hours: Vitals:   03/07/16 0324 03/07/16 0327 03/07/16 0537 03/07/16 1349  BP: (!) 92/38 (!) 109/43 (!) 109/31 (!) 102/40  Pulse:   89 78  Resp:   17 18  Temp:   98.2 F (36.8 C) 97.6 F (36.4 C)  TempSrc:   Oral Oral  SpO2:   96% 94%  Weight:   102 lb 11.8 oz (46.6 kg)   Height:       Physical Exam  Constitutional: She is oriented to person, place, and time.  Appears frail in no acute distress  HENT:  Head: Normocephalic and atraumatic.  Cardiovascular: Exam reveals no gallop and no friction rub.   No murmur heard. Irregularly irregular rhythm consistent with atrial fibrillation, tachycardic approximately 110 bpm  Respiratory: Breath sounds normal. No respiratory distress. She has no wheezes. She has no rales.  GI: Soft. Bowel sounds are normal.  No abdominal bruits auscultated  Musculoskeletal:  Patient complaint pain in the left hip, left hip is weak and patient unable to lift left leg off the bed  Neurological: She is alert and oriented to person, place, and time.  Skin:  Pressure ulcer     Assessment/Plan:  Cynthia Hicks is in extremely frail 82 year old woman with a functional decline who presented from a skilled nursing facility for nausea, vomiting and atrial fibrillation.  1. Nausea, vomiting and leukocytosis -- On chronic immunosuppression for autoimmune liver disease -- Patient has leukocytosis noted at several previous hospitalizations, I do not think this leukocytosis is secondary to steroid use -- Patient has history of infections in the past including MSSA osteomyelitis and septic arthritis -- Blood cultures -- Urine cultures and urinalysis -- ESR and CRP   2. Anemia -- Patient with documented chronic anemia, most recent  hemoglobin 8.3 -- Chronic leukocytosis -- Given abnormalities in both her leukocyte count and hemoglobin we will do further blood work to rule out a potential hematologic malignancy including CML or CLL -- CBC with differential -- Anemia panel -- Peripheral smear  3. Atrial fibrillation with RVR -- Asymptomatic, not complaining of chest pain or shortness of breath -- Hemodynamically stable -- Potential etiologies for recurrent atrial fibrillation with RVR would include infection -- Bisoprolol 5 mg once daily, will increase to twice daily if rate is not controlled if she is hemodynamically stable and blood pressure allows  4. Left hip pain -- Patient at risk for avascular necrosis of femoral head based on chronic steroid use -- Pain may also represent potential source of infection explaining leukocytosis and recurrent hospital admissions -- Diagnostic hip and pelvis x-rays showed no acute abnormality -- Consider MRI if pain continues, or blood cultures and urine cultures are negative  5. DVT/PE prophylaxis -- Currently anticoagulated for atrial fibrillation with a packs a day and 2.5 mg twice a day  6. Positive troponin -- Patient with single positive troponin of 0.08 -- Currently, denies chest pain, shortness of breath, back pain or palpitations -- Patient had several episodes of nausea and vomiting yesterday and is noted to be in atrial fibrillation with RVR, this elevated troponin may be secondary to these issues or a small demand ischemia secondary to the patient's tachycardia. Currently, we will not trend troponins as the patient has been asymptomatic, this test was ordered early  this morning and the value just returned. We do not think this elevated troponin is secondary to acute infarction. She continues to deny chest pain or shortness of breath. However, to ensure completeness and thoroughness we had made the night team aware of this troponin value and discussed this at length.   7.  Pressure ulcers -- Patient with several pressure ulcers noted -- Wound care nurse consulted -- If blood cultures and urine cultures are negative and the patient's leukocytosis remains consider further workup of these ulcers as potential source of infection with MRI  Dispo: Anticipated discharge in approximately 1-2 day(s).   LOS: 1 day   Cynthia Shoulder, MD 03/07/2016, 3:22 PM Pager: [phone]

## 2016-03-08 ENCOUNTER — Observation Stay (HOSPITAL_COMMUNITY): Payer: Medicare Other

## 2016-03-08 DIAGNOSIS — D72829 Elevated white blood cell count, unspecified: Secondary | ICD-10-CM | POA: Diagnosis not present

## 2016-03-08 DIAGNOSIS — J90 Pleural effusion, not elsewhere classified: Secondary | ICD-10-CM | POA: Diagnosis not present

## 2016-03-08 DIAGNOSIS — L89109 Pressure ulcer of unspecified part of back, unspecified stage: Secondary | ICD-10-CM

## 2016-03-08 DIAGNOSIS — R112 Nausea with vomiting, unspecified: Secondary | ICD-10-CM | POA: Diagnosis not present

## 2016-03-08 DIAGNOSIS — I4891 Unspecified atrial fibrillation: Secondary | ICD-10-CM | POA: Diagnosis not present

## 2016-03-08 DIAGNOSIS — D649 Anemia, unspecified: Secondary | ICD-10-CM

## 2016-03-08 LAB — GLUCOSE, CAPILLARY
GLUCOSE-CAPILLARY: 93 mg/dL (ref 65–99)
Glucose-Capillary: 70 mg/dL (ref 65–99)
Glucose-Capillary: 268 mg/dL — ABNORMAL HIGH (ref 65–99)
Glucose-Capillary: 158 mg/dL — ABNORMAL HIGH (ref 65–99)

## 2016-03-08 LAB — URINALYSIS, ROUTINE W REFLEX MICROSCOPIC
BILIRUBIN URINE: NEGATIVE
GLUCOSE, UA: NEGATIVE mg/dL
HGB URINE DIPSTICK: NEGATIVE
KETONES UR: NEGATIVE mg/dL
Nitrite: NEGATIVE
PROTEIN: NEGATIVE mg/dL
Specific Gravity, Urine: 1.018 (ref 1.005–1.030)
pH: 5.5 (ref 5.0–8.0)

## 2016-03-08 LAB — URINE MICROSCOPIC-ADD ON
Bacteria, UA: NONE SEEN
RBC / HPF: NONE SEEN RBC/hpf (ref 0–5)

## 2016-03-08 MED ORDER — GADOBENATE DIMEGLUMINE 529 MG/ML IV SOLN
10.0000 mL | Freq: Once | INTRAVENOUS | Status: AC | PRN
  Administered 2016-03-08: 9 mL via INTRAVENOUS

## 2016-03-08 MED ORDER — ALPRAZOLAM 0.25 MG PO TABS
0.2500 mg | ORAL_TABLET | Freq: Once | ORAL | Status: AC
  Administered 2016-03-08: 0.25 mg via ORAL
  Filled 2016-03-08 (×2): qty 1

## 2016-03-08 NOTE — Discharge Instructions (Addendum)
--   Please make an appointment with Dr. Sharol Given [phone]) in orthopedic surgery for follow-up of left hip pain and MRI results    Information on my medicine - ELIQUIS (apixaban)  This medication education was reviewed with me or my healthcare representative as part of my discharge preparation.  The pharmacist that spoke with me during my hospital stay was:  Jodean Lima Rudisill, RPH  Why was Eliquis prescribed for you? Eliquis was prescribed for you to reduce the risk of a blood clot forming that can cause a stroke if you have a medical condition called atrial fibrillation (a type of irregular heartbeat).  What do You need to know about Eliquis ? Take your Eliquis TWICE DAILY - one tablet in the morning and one tablet in the evening with or without food. If you have difficulty swallowing the tablet whole please discuss with your pharmacist how to take the medication safely.  Take Eliquis exactly as prescribed by your doctor and DO NOT stop taking Eliquis without talking to the doctor who prescribed the medication.  Stopping may increase your risk of developing a stroke.  Refill your prescription before you run out.  After discharge, you should have regular check-up appointments with your healthcare provider that is prescribing your Eliquis.  In the future your dose may need to be changed if your kidney function or weight changes by a significant amount or as you get older.  What do you do if you miss a dose? If you miss a dose, take it as soon as you remember on the same day and resume taking twice daily.  Do not take more than one dose of ELIQUIS at the same time to make up a missed dose.  Important Safety Information A possible side effect of Eliquis is bleeding. You should call your healthcare provider right away if you experience any of the following: ? Bleeding from an injury or your nose that does not stop. ? Unusual colored urine (red or dark brown) or unusual colored stools (red  or black). ? Unusual bruising for unknown reasons. ? A serious fall or if you hit your head (even if there is no bleeding).  Some medicines may interact with Eliquis and might increase your risk of bleeding or clotting while on Eliquis. To help avoid this, consult your healthcare provider or pharmacist prior to using any new prescription or non-prescription medications, including herbals, vitamins, non-steroidal anti-inflammatory drugs (NSAIDs) and supplements.  This website has more information on Eliquis (apixaban): http://www.eliquis.com/eliquis/home

## 2016-03-08 NOTE — Evaluation (Addendum)
Physical Therapy Evaluation Patient Details Name: Cynthia Hicks MRN: [id number] DOB: 12-16-1932 Today's Date: 03/08/2016   History of Present Illness  patient is a 82 y/o female with PMH of CAD s/p stents, pAfib on a/c, DM, HTN, chronic diastolic HF, CKD, AI hepatitis on chronic prednisone, and anemia. Patient adm for nausea, vomiting.  +Lt hip pain (xray negative) and lumbar pain (with wound)  Clinical Impression  Pt admitted with above diagnoses, after several recent hospitalizations. Patient reports working with therapy at SNF, "when I'm well enough and I'm not here." States motivated to regain function. At time of evaluation, very limited by hip pain and noted MD considering MRI if pain persists. Pt currently with functional limitations due to the deficits listed below (see PT Problem List).  Pt will benefit from skilled PT to increase their independence and safety with mobility to allow discharge to the venue listed below.       Follow Up Recommendations SNF;Supervision/Assistance - 24 hour    Equipment Recommendations  None recommended by PT    Recommendations for Other Services       Precautions / Restrictions Precautions Precautions: Fall      Mobility  Bed Mobility Overal bed mobility: Needs Assistance Bed Mobility: Rolling Rolling: Mod assist         General bed mobility comments: from Left to right side; slow due to pain; +use of rail  Transfers                    Ambulation/Gait                Stairs            Wheelchair Mobility    Modified Rankin (Stroke Patients Only)       Balance                                             Pertinent Vitals/Pain HR 87 at rest  Pain Assessment: 0-10 Pain Score: 10-Worst pain ever Pain Location: Lt hip and sore on back Pain Descriptors / Indicators: Grimacing;Guarding Pain Intervention(s): Limited activity within patient's tolerance;Monitored during  session;Repositioned;Patient requesting pain meds-RN notified    Home Living Family/patient expects to be discharged to:: Skilled nursing facility               Home Equipment: Walker - 4 wheels Additional Comments: Reports at SNF ~5 weeks. there for rehab    Prior Function Level of Independence: Independent with assistive device(s)   Gait / Transfers Assistance Needed: Primarily uses w/c at SNF, occasional walking with RW when working with PT (75' at most). Prior to SNF stay, pt using 4ww independently.  ADL's / Homemaking Assistance Needed: Assist with all ADLs at SNF. Pt reports she has been too weak to complete by herself.        Hand Dominance   Dominant Hand: Right    Extremity/Trunk Assessment   Upper Extremity Assessment: Defer to OT evaluation           Lower Extremity Assessment: LLE deficits/detail;RLE deficits/detail RLE Deficits / Details: +heel ulcers per WOC, RN documentation (dressings in place); hip flexion, knee extension 2+ LLE Deficits / Details: very painful; repositioned for comfort and decr pressure on heel (also with dressings)  Cervical / Trunk Assessment: Kyphotic (3 pillows and neck roll with HOB elevated for comfort)  Communication  Communication: No difficulties  Cognition Arousal/Alertness: Awake/alert Behavior During Therapy: Restless Overall Cognitive Status: Within Functional Limits for tasks assessed                      General Comments General comments (skin integrity, edema, etc.): Assisted to reposition for comfort and decr pressure on areas of skin breakdown. Rt sidelying with pillows to offload area with wound on her back. bil heels "floated"    Exercises General Exercises - Lower Extremity Ankle Circles/Pumps: AROM;Both;5 reps      Assessment/Plan    PT Assessment Patient needs continued PT services  PT Diagnosis Generalized weakness;Acute pain   PT Problem List Decreased strength;Pain;Decreased  balance;Decreased activity tolerance;Decreased mobility;Impaired sensation;Decreased knowledge of use of DME;Decreased skin integrity;Decreased range of motion  PT Treatment Interventions Balance training;Gait training;Functional mobility training;Therapeutic activities;Therapeutic exercise;Patient/family education;DME instruction   PT Goals (Current goals can be found in the Care Plan section) Acute Rehab PT Goals Patient Stated Goal: to get back to rehab and get stronger PT Goal Formulation: With patient Time For Goal Achievement: 03/22/16 Potential to Achieve Goals: Good    Frequency Min 2X/week   Barriers to discharge Decreased caregiver support      Co-evaluation               End of Session   Activity Tolerance: Patient limited by pain Patient left: in bed;with call bell/phone within reach;with nursing/sitter in room Nurse Communication: Patient requests pain meds         Time: 1013-1027 PT Time Calculation (min) (ACUTE ONLY): 14 min   Charges:   PT Evaluation $PT Eval Low Complexity: 1 Procedure     PT G Codes:        Cynthia Hicks 11-Mar-2016, 2:22 PM  Pager [phone]

## 2016-03-08 NOTE — Progress Notes (Signed)
   Subjective: No acute events overnight. Patient continues to complain of severe pain in her left hip and lumbar region of her back. She has a shallow pressure ulcer over her lumbar region which does not appear infected. She denies headache, nausea or vomiting.  Objective:  Vital signs in last 24 hours: Vitals:   03/07/16 1349 03/07/16 2056 03/07/16 2124 03/08/16 0355  BP: (!) 102/40  (!) 90/33 (!) 123/43  Pulse: 78 74 82   Resp: '18 18 18 18  '$ Temp: 97.6 F (36.4 C) 98.2 F (36.8 C) 98.2 F (36.8 C) 97.9 F (36.6 C)  TempSrc: Oral Oral Oral Oral  SpO2: 94% 97% 95%   Weight:    105 lb (47.6 kg)  Height:       Physical Exam  Constitutional:  Frail and ill-appearing.  HENT:  Head: Normocephalic and atraumatic.  Cardiovascular: Normal rate and regular rhythm.  Exam reveals no gallop and no friction rub.   No murmur heard. Patient in normal sinus rhythm this morning  Respiratory: Effort normal and breath sounds normal. No respiratory distress. She has no wheezes. She has no rales.  GI: Soft. She exhibits no distension. There is no tenderness.  Musculoskeletal:  Pain with internal rotation of the left hip and pain with palpation of the left hip.  Skin:  Shallow ulcer of the patient's midline back in the lumbar region with surrounding erythema. No signs of infection     Assessment/Plan:  Mrs. Stantz is in extremely frail 82 year old woman with a functional decline who presented from a skilled nursing facility for nausea, vomiting and atrial fibrillation.  1. Nausea, vomiting and leukocytosis -- Differential diagnosis includes osteomyelitis or avascular necrosis of the left hip -- On chronic immunosuppression for autoimmune liver disease -- Patient with leukocytosis predominantly neutrophilia -- Patient has history of infections in the past including MSSA osteomyelitis and septic arthritis -- Blood cultures no growth to date -- Urine cultures and urinalysis negative for  infection -- ESR and CRP are both elevated   2. Anemia -- Patient with documented chronic anemia, most recent hemoglobin 8.3 -- Chronic leukocytosis, current white count 17.5 -- Anemia panel is consistent with anemia of chronic disease -- Peripheral smear not concerning for malignancy  3. Atrial fibrillation with RVR -- Asymptomatic, not complaining of chest pain or shortness of breath -- Hemodynamically stable -- Potential etiologies for recurrent atrial fibrillation with RVR would include infection -- Patient in normal sinus rhythm this morning. We will hold bisoprolol to 5 mg once daily given patient's history of low blood pressures.  4. Left hip pain -- Patient at risk for avascular necrosis of femoral head based on chronic steroid use -- Pain may also represent potential source of infection explaining leukocytosis and recurrent hospital admissions -- Diagnostic hip and pelvis x-rays showed no acute abnormality -- MRI today, patient has claustrophobia and  requested Xanax before imaging study  5. DVT/PE prophylaxis -- Currently anticoagulated for atrial fibrillation with a packs a day and 2.5 mg twice a day   6. Pressure ulcers -- Patient with several pressure ulcers noted -- Wound care nurse consulted -- If blood cultures and urine cultures are negative and the patient's leukocytosis remains consider further workup of these ulcers as potential source of infection with MRI  Dispo: Anticipated discharge in approximately 1-2 day(s).   LOS: 1 day   Ophelia Shoulder, MD 03/08/2016, 12:59 PM Pager: [phone]

## 2016-03-09 DIAGNOSIS — X58XXXA Exposure to other specified factors, initial encounter: Secondary | ICD-10-CM

## 2016-03-09 DIAGNOSIS — E440 Moderate protein-calorie malnutrition: Secondary | ICD-10-CM

## 2016-03-09 DIAGNOSIS — I4891 Unspecified atrial fibrillation: Secondary | ICD-10-CM | POA: Diagnosis not present

## 2016-03-09 DIAGNOSIS — D72829 Elevated white blood cell count, unspecified: Secondary | ICD-10-CM | POA: Diagnosis not present

## 2016-03-09 DIAGNOSIS — S73199A Other sprain of unspecified hip, initial encounter: Secondary | ICD-10-CM

## 2016-03-09 DIAGNOSIS — R634 Abnormal weight loss: Secondary | ICD-10-CM | POA: Diagnosis not present

## 2016-03-09 DIAGNOSIS — M25559 Pain in unspecified hip: Secondary | ICD-10-CM

## 2016-03-09 LAB — CBC
HEMATOCRIT: 25.2 % — AB (ref 36.0–46.0)
HEMOGLOBIN: 7.7 g/dL — AB (ref 12.0–15.0)
MCH: 27.7 pg (ref 26.0–34.0)
MCHC: 30.6 g/dL (ref 30.0–36.0)
MCV: 90.6 fL (ref 78.0–100.0)
Platelets: 325 10*3/uL (ref 150–400)
RBC: 2.78 MIL/uL — AB (ref 3.87–5.11)
RDW: 20.5 % — ABNORMAL HIGH (ref 11.5–15.5)
WBC: 11.2 10*3/uL — AB (ref 4.0–10.5)

## 2016-03-09 LAB — GLUCOSE, CAPILLARY
GLUCOSE-CAPILLARY: 90 mg/dL (ref 65–99)
GLUCOSE-CAPILLARY: 157 mg/dL — AB (ref 65–99)
GLUCOSE-CAPILLARY: 205 mg/dL — AB (ref 65–99)

## 2016-03-09 MED ORDER — BISOPROLOL FUMARATE 5 MG PO TABS: 5.0000 mg | ORAL_TABLET | Freq: Every day | ORAL | 3 refills | Status: AC

## 2016-03-09 MED ORDER — BISOPROLOL FUMARATE 5 MG PO TABS: 5.0000 mg | ORAL_TABLET | Freq: Every day | ORAL | 3 refills | Status: DC

## 2016-03-09 NOTE — Discharge Summary (Signed)
Name: Cynthia Hicks MRN: [id number] DOB: [birth year] 82 y.o. PCP: Raelyn Number, MD  Date of Admission: 03/06/2016  6:48 PM Date of Discharge: 03/09/2016 Attending Physician: Axel Filler, MD  Discharge Diagnosis: 1. Atrial fibrillation with rapid ventricular response 2. Chronic anemia and neutrophilic leukocytosis 3. Left iliopsoas tendon rupture 4. Pressure ulcers  Discharge Medications:   Medication List    STOP taking these medications   alendronate 70 MG tablet Commonly known as:  FOSAMAX   clotrimazole 10 MG troche Commonly known as:  MYCELEX   ferrous gluconate 240 (27 FE) MG tablet Commonly known as:  FERGON   furosemide 40 MG tablet Commonly known as:  LASIX   nitroGLYCERIN 0.4 MG SL tablet Commonly known as:  NITROSTAT   pravastatin 80 MG tablet Commonly known as:  PRAVACHOL   vitamin B-12 1000 MCG tablet Commonly known as:  CYANOCOBALAMIN   Vitamin D3 2000 units Tabs     TAKE these medications   acetaminophen 325 MG tablet Commonly known as:  TYLENOL Take 2 tablets (650 mg total) by mouth every 6 (six) hours as needed for mild pain (or Fever >/= 101).   anti-nausea solution Take 15 mLs by mouth every 15 (fifteen) minutes as needed for nausea or vomiting.   apixaban 2.5 MG Tabs tablet Commonly known as:  ELIQUIS Take 1 tablet (2.5 mg total) by mouth 2 (two) times daily.   bisoprolol 5 MG tablet Commonly known as:  ZEBETA Take 1 tablet (5 mg total) by mouth daily. What changed:  how much to take   collagenase ointment Commonly known as:  SANTYL Apply topically daily.   diclofenac sodium 1 % Gel Commonly known as:  VOLTAREN Apply 2 g topically 4 (four) times daily.   FLONASE 50 MCG/ACT nasal spray Generic drug:  fluticasone Place 1 spray into both nostrils at bedtime.   HUMULIN R 100 units/mL injection Generic drug:  insulin regular Inject 4-16 Units into the skin 3 (three) times daily before meals. Per sliding scale: BGL  120-200 = 4 units; 201-250 = 6 units; 251-300 = 8 units; 301-350 = 10 units; 351-400 = 14 units; 401-450 = 16 units   magnesium hydroxide 400 MG/5ML suspension Commonly known as:  MILK OF MAGNESIA Take 30 mLs by mouth every 6 (six) hours as needed for mild constipation.   morphine 60 MG 12 hr tablet Commonly known as:  MS CONTIN Take 1 tablet (60 mg total) by mouth every 12 (twelve) hours.   multivitamin tablet Take 1 tablet by mouth daily.   omeprazole 20 MG capsule Commonly known as:  PRILOSEC Take 20 mg by mouth daily.   ondansetron 8 MG tablet Commonly known as:  ZOFRAN Take 8 mg by mouth every 8 (eight) hours as needed for nausea or vomiting.   polyethylene glycol packet Commonly known as:  MIRALAX / GLYCOLAX Take 17 g by mouth daily as needed for mild constipation or moderate constipation.   polyvinyl alcohol 1.4 % ophthalmic solution Commonly known as:  LIQUIFILM TEARS Place 1 drop into both eyes daily as needed for dry eyes.   predniSONE 10 MG tablet Commonly known as:  DELTASONE Take 1 tablet (10 mg total) by mouth daily with breakfast.   senna 8.6 MG Tabs tablet Commonly known as:  SENOKOT Take 1 tablet (8.6 mg total) by mouth at bedtime as needed for moderate constipation. What changed:  reasons to take this   traMADol 50 MG tablet Commonly known as:  Veatrice Bourbon  Take 1 tablet (50 mg total) by mouth every 6 (six) hours as needed for severe pain.   vitamin C 1000 MG tablet Take 1,000 mg by mouth 2 (two) times daily.       Disposition and follow-up:   Cynthia Hicks was discharged from [facility] in Stable condition.  At the hospital follow up visit please address:  1.  Please ensure Cynthia Hicks has scheduled a follow-up appointment with orthopedic surgery to discuss her left iliopsoas tendon rupture  2.  Labs / imaging needed at time of follow-up: No labs needed  3.  Pending labs/ test needing follow-up: No pending labs  Follow-up  Appointments: Follow-up Information    DUDA,MARCUS V, MD. Schedule an appointment as soon as possible for a visit today.   Specialty:  Orthopedic Surgery Why:  Please call [phone] to arrange an appointment with Dr. due to an orthopedics concerning hip pain and MRI findings. Contact information: [place] [id number] [place] Hospital Course by problem list: Principal Problem:   PAF (paroxysmal atrial fibrillation) (HCC) Active Problems:   Autoimmune hepatitis ([address])   Long term (current) use of anticoagulants   Chronic diastolic CHF (congestive heart failure) (HCC)   Pressure ulcer stage II   Leukocytosis   Atrial fibrillation (HCC)   Hip pain   1. Atrial fibrillation with rapid ventricular response Patient presented from [facility] for evaluation of atrial fibrillation with rapid ventricular response following a 2 day history of nausea and vomiting. This episode of atrial fibrillation is most likely secondary to dehydration and malnutrition. At the time of admission the patient was hemodynamically stable and remained so during the course of her stay. We increased the patient's bisoprolol to 5 mg once daily and were able to achieve rate control. She will be discharged home on 5 mg of bisoprolol once daily for treatment of her atrial fibrillation. We did not increase the dose further due to concerns for hypotension. At the time of discharge the patient was in normal sinus rhythm, hemodynamically stable and ready for discharge.  2. Chronic anemia and neutrophilic leukocytosis Patient has a chronic anemia and leukocytosis which is neutrophilic. She is chronically immunosuppressed secondary to steroid use for autoimmune liver disease. To evaluate the patient's anemia we ordered an anemia panel and a smear. She is not iron deficient and her anemia panel was consistent with anemia of chronic disease. The smear was not concerning for  underlying malignancy. To evaluate the patient's leukocytosis we did an infectious workup including blood cultures and urine cultures which were both negative for infection. Additionally, to evaluate the patient's left hip pain we ordered an MRI to evaluate for potential osteomyelitis and this imaging modality was also negative for infection. She remained afebrile and hemodynamically stable on service. Currently, there is no clear etiology for the patient's chronic anemia neutrophilic leukocytosis.  3. Left iliopsoas tendon rupture Patient complaining of severe left hip pain. We ordered a left hip diagnostic x-ray which was negative for bony abnormality. To further evaluate this we order a left hip MRI. This imaging study was positive for left iliopsoas tendon rupture, severe compression of the thecal sac at L3-4 and soft disc protrusion at L4-5. I discussed the patient with an orthopedic surgeon who recommended outpatient follow-up with orthopedics. Additionally, orthopedic surgery recommended discharge to skilled nursing facility for inpatient rehabilitation. Additionally, I spoke with neurosurgery regarding thecal sac compression  and disc protrusion. Neurosurgery does not think this is the source of the patient's pain as it would most likely be explained by the iliopsoas tendon rupture. Neurosurgery indicated this was most likely an incidental finding and recommended follow-up with orthopedic surgery. Both surgical services contacted stated the patient would be a poor or non- surgical candidate based on her comorbidities, frailty and malnutrition.  4. Pressure ulcers Patient with pressure ulcer over her lumbar spine. This ulcer was shallow and death and only marginally erythematous. There were no signs of infection. She was seen by wound nurse and treated appropriately. Please continue to follow-up with this at the rehabilitation facility.   Discharge Vitals:   BP 109/68 (BP Location: Left Arm)   Pulse  (!) 57   Temp 97.6 F (36.4 C) (Oral)   Resp 18   Ht 5\' 1"  (1.549 m)   Wt 104 lb 3.2 oz (47.3 kg)   SpO2 91%   BMI 19.69 kg/m   Pertinent Labs, Studies, and Procedures:  See MRI  Discharge Instructions: Discharge Instructions    Diet general    Complete by:  As directed   Diet general    Complete by:  As directed   Increase activity slowly    Complete by:  As directed   Increase activity slowly    Complete by:  As directed      Signed: Ophelia Shoulder, MD 03/09/2016, 2:01 PM   Pager: [phone]

## 2016-03-09 NOTE — Progress Notes (Signed)
Subjective: Patient continues to complain of lower left hip pain and back pain. She says that the pain medication helps some. She says the pain is improved when she is able to walk around. She has no additional acute concerns or complaints today.  Objective:  Vital signs in last 24 hours: Vitals:   03/08/16 1401 03/08/16 2005 03/09/16 0500 03/09/16 0600  BP: (!) 103/45 (!) 97/37  109/68  Pulse: 81 76  (!) 57  Resp:  18  18  Temp: 97.4 F (36.3 C) 98.1 F (36.7 C)  97.6 F (36.4 C)  TempSrc: Oral Oral  Oral  SpO2: 97% 99%  91%  Weight:   104 lb 4.8 oz (47.3 kg) 104 lb 3.2 oz (47.3 kg)  Height:       Physical Exam  Constitutional: She is oriented to person, place, and time.  Malnourished, frail and ill appearing  HENT:  Head: Normocephalic and atraumatic.  Cardiovascular: Regular rhythm.  Exam reveals no gallop and no friction rub.   No murmur heard. Tachycardic but in regular rhythm  Respiratory: Breath sounds normal. No respiratory distress. She has no wheezes. She has no rales.  GI: Soft. She exhibits no distension. There is no tenderness.  No abdominal bruits auscultated  Musculoskeletal:  Patient unable to lift left leg up off the bed secondary to hip pain. Patient also has pain to palpation over the left hip  Neurological: She is alert and oriented to person, place, and time.  Psychiatric: She has a normal mood and affect.     Assessment/Plan:  Principal Problem:   PAF (paroxysmal atrial fibrillation) (HCC) Active Problems:   Autoimmune hepatitis ([place])   Long term (current) use of anticoagulants   Chronic diastolic CHF (congestive heart failure) (HCC)   Pressure ulcer stage II   Leukocytosis   Atrial fibrillation [facility])  Mrs. Kostick is in extremely frail 82 year old woman with a functional decline and malnutrition who presented from a skilled nursing facility for nausea, vomiting and atrial fibrillation.  1. Nausea and vomiting, resolved -- The underlying  etiology of this abdominal pain is not clear but may be due to an overall functional decline in the setting of dehydration and poor by mouth intake. -- On chronic immunosuppression for autoimmune liver disease -- Blood cultures no growth to date --  Urinalysis negative for infection -- LFTs normal  -- Has remained afebrile while on service and hemodynamically stable -- She has not complained of nausea, vomiting or abdominal pain since admission   2. Anemia and leukocytosis with neutrophilia -- Patient with documented chronic anemia, most recent hemoglobin 7.7 -- Chronic leukocytosis, current white count 11.2 -- Anemia panel is consistent with anemia of chronic disease -- No evidence of melena or blood loss, not iron deficient -- Peripheral smear not concerning for malignancy -- Leukocytosis may be secondary to her steroid  use for autoimmune hepatitis  3. Atrial fibrillation with RVR -- Asymptomatic, not complaining of chest pain or shortness of breath -- Hemodynamically stable -- Potential etiologies for recurrent atrial fibrillation with RVR would include infection -- Patient in normal sinus rhythm this morning. She will need to continue bisoprolol 5 mg once daily for rate control. We'll not increase this medication due to potential hypotension.  4. Left hip pain -- MRI demonstrates rupture of the left iliopsoas tendons with fluid surrounding torn tendons as well as severe compression of the thecal sac at L3/4 and soft disc protrusion at L4/5 -- Spoke with orthopedic surgery who  recommended outpatient physical therapy and clinic follow-up. Patient currently not a surgical candidate. Orthopedic surgery does not think fluid is source of infection or leukocytosis. We will schedule outpatient orthopedic follow-up after discharge. -- Spoke with neurosurgery who does not think there is a neurosurgical intervention. Patient would be a poor surgical candidate. Neurosurgery thinks patient's  pain is explained by iliopsoas tendon rupture and is not secondary to thecal sac compression or disc protrusion.  --Morphine sulfate Contin 60 mg every 12 hours for pain  5. DVT/PE prophylaxis -- Currently anticoagulated for atrial fibrillation with a packs a day and 2.5 mg twice a day  6. Pressure ulcers -- Patient with several pressure ulcers noted -- Wound care nurse consulted -- Ulcer of the lumbar spine is very superficial surrounded by only very minimal erythema and does not appear infected.  Dispo: Anticipated discharge today.   LOS: 1 day   Ophelia Shoulder, MD 03/09/2016, 12:33 PM Pager: [phone]

## 2016-03-09 NOTE — Progress Notes (Signed)
PT Cancellation Note  Patient Details Name: Cynthia Hicks MRN: [id number] DOB: 07-30-1933   Cancelled Treatment:    Reason Eval/Treat Not Completed: Medical issues which prohibited therapy.  Significant findings in MRI and will await the disposition of care to mobilize pt.   Ramond Dial 03/09/2016, 9:03 AM    Mee Hives, PT MS Acute Rehab Dept. Number: [place] and [place]

## 2016-03-09 NOTE — Clinical Social Work Note (Signed)
Patient is from [facility] and will return.   Per MD patient is ready to discharge back to [facility] SNF. RN, patient, patient's son [name], and facility notified of discharge. RN given phone number for report and transport packet is on patient's chart. Ambulance transport requested. CSW signing off.   [employer], LCSW [phone]

## 2016-03-09 NOTE — NC FL2 (Signed)
[place] LEVEL OF CARE SCREENING TOOL     IDENTIFICATION  Patient Name: Cynthia Hicks Birthdate: 1933/05/27 Sex: female Admission Date (Current Location): 03/06/2016  [facility] and [place] Number:  Herbalist and Address:  [facility] [place]. [facility], [place] [address] [place], [place] [address]      Provider Number: [id number]  Attending Physician Name and Address:  Axel Filler, MD  Relative Name and Phone Number:       Current Level of Care: Hospital Recommended Level of Care: [place] Prior Approval Number:    Date Approved/Denied:   PASRR Number: [id number]  Discharge Plan: SNF    Current Diagnoses: Patient Active Problem List   Diagnosis Date Noted  . Atrial fibrillation ([place]) 03/07/2016  . Goals of care, counseling/discussion   . Hypotension 02/24/2016  . Leukocytosis 02/24/2016  . Pressure ulcer stage II 02/08/2016  . Bradycardia 02/07/2016  . Absolute anemia   . Anemia 01/26/2016  . Chest pain 01/19/2016  . PAF (paroxysmal atrial fibrillation) ([place])   . Hyperlipidemia   . PAD (peripheral artery disease) ([place])   . Chronic acquired lymphedema   . Septic arthritis of knee ([place]) 01/09/2016  . Malnutrition of moderate degree 01/08/2016  . Pressure ulcer 01/08/2016  . Sepsis due to methicillin resistant Staphylococcus aureus ([place]) 06/08/2015  . Visit for monitoring Tikosyn therapy   . CKD (chronic kidney disease), stage III 06/24/2014  . Acute on chronic diastolic CHF (congestive heart failure) ([place])   . Diabetes mellitus type 2, uncontrolled ([place])   . Polyneuropathy in diabetes(357.2) 03/04/2014  . Localized, primary osteoarthritis of shoulder region 11/02/2013  . Chronic diastolic CHF (congestive heart failure) ([place]) 05/02/2013  . Biliary calculi, common bile duct 02/28/2013  . Fungal infection of nail 02/28/2013  . H/O immunosuppressive therapy 02/28/2013  . Long term (current) use of  anticoagulants 10/31/2012  . Swelling of limb 10/22/2012  . Pain in limb 10/22/2012  . Lymphedema 10/22/2012  . Arteriosclerosis of coronary artery 04/04/2012  . Encounter for long-term (current) use of steroids 04/04/2012  . Diabetes mellitus, type 2 ([place]) 04/04/2012  . HLD (hyperlipidemia) 04/04/2012  . Disease of liver 04/04/2012  . Heart attack ([place]) 04/04/2012  . Long term current use of systemic steroids 04/04/2012  . Abnormal abdominal MRI 03/03/2012  . Abdominal pain 12/04/2011  . Coronary artery disease   . Chronic back pain   . Chronic kidney disease   . Autoimmune liver disease   . Carotid bruit 07/02/2011  . Hypoxemia 11/17/2010  . Palpitations 11/06/2010  . OTHER NONINFECTIOUS DISORDERS LYMPHATIC CHANNELS 07/21/2009  . Diabetes mellitus ([place]) 07/07/2009  . Anemia of chronic disease 07/07/2009  . HTN (hypertension) 07/07/2009  . GERD 07/07/2009  . Autoimmune hepatitis ([place]) 07/07/2009  . DEGENERATIVE DISC DISEASE 07/07/2009  . PSOAS MUSCLE ABSCESS 07/05/2009  . HYPERLIPIDEMIA-MIXED 01/02/2009  . Chronic venous insufficiency 01/02/2009    Orientation RESPIRATION BLADDER Height & Weight     Self, Time, Situation    Continent Weight: 104 lb 3.2 oz (47.3 kg) Height:  5\' 1"  (154.9 cm)  BEHAVIORAL SYMPTOMS/MOOD NEUROLOGICAL BOWEL NUTRITION STATUS   (none)  (None) Continent  (Heart)  AMBULATORY STATUS COMMUNICATION OF NEEDS Skin   Extensive Assist Verbally PU Stage and Appropriate Care  LT Anterior Foot Stage II and Spine Medial Stage II and Scrum Stage ii  Personal Care Assistance Level of Assistance  Bathing, Feeding, Dressing Bathing Assistance: Limited assistance Feeding assistance: Independent Dressing Assistance: Limited assistance     Functional Limitations Info  Sight, Hearing, Speech Sight Info: Adequate Hearing Info: Adequate Speech Info: Adequate    SPECIAL CARE FACTORS FREQUENCY  PT (By licensed PT), OT (By licensed OT)      PT Frequency: 5/ week OT Frequency: 5/ week            Contractures Contractures Info: Not present    Additional Factors Info  Code Status, Allergies Code Status Info: Full Allergies Info: Cyclosporine, Azathioprine, Mycophenolate, Other, Shellfish Allergy           Current Medications (03/09/2016):  This is the current hospital active medication list Current Facility-Administered Medications  Medication Dose Route Frequency Provider Last Rate Last Dose  . acetaminophen (TYLENOL) tablet 650 mg  650 mg Oral Q6H PRN Carly J Rivet, MD      . anti-nausea (EMETROL) solution 15 mL  15 mL Oral Q15 min PRN Juliet Rude, MD      . apixaban (ELIQUIS) tablet 2.5 mg  2.5 mg Oral BID Juliet Rude, MD   2.5 mg at 03/09/16 0857  . bisoprolol (ZEBETA) tablet 5 mg  5 mg Oral Daily Juliet Rude, MD   5 mg at 03/09/16 0857  . clotrimazole (MYCELEX) troche 10 mg  10 mg Oral 5 X Daily Juliet Rude, MD   10 mg at 03/09/16 0856  . insulin aspart (novoLOG) injection 0-5 Units  0-5 Units Subcutaneous QHS Carly J Rivet, MD      . insulin aspart (novoLOG) injection 0-9 Units  0-9 Units Subcutaneous TID WC Juliet Rude, MD   2 Units at 03/08/16 1806  . insulin glargine (LANTUS) injection 5 Units  5 Units Subcutaneous QHS Juliet Rude, MD   5 Units at 03/07/16 2231  . morphine (MS CONTIN) 12 hr tablet 60 mg  60 mg Oral Q12H Carly J Rivet, MD   60 mg at 03/09/16 0857  . mupirocin cream (BACTROBAN) 2 %   Topical Daily Axel Filler, MD      . ondansetron [facility]) tablet 8 mg  8 mg Oral Q8H PRN Carly J Rivet, MD      . pantoprazole (PROTONIX) EC tablet 40 mg  40 mg Oral Daily Juliet Rude, MD   40 mg at 03/09/16 0857  . polyethylene glycol (MIRALAX / GLYCOLAX) packet 17 g  17 g Oral Daily PRN Carly J Rivet, MD      . pravastatin (PRAVACHOL) tablet 80 mg  80 mg Oral QPM Juliet Rude, MD   80 mg at 03/08/16 1806  . predniSONE (DELTASONE) tablet 10 mg  10 mg Oral Q breakfast Juliet Rude, MD    10 mg at 03/09/16 0856  . senna (SENOKOT) tablet 8.6 mg  1 tablet Oral QHS PRN Carly J Rivet, MD      . sodium chloride flush (NS) 0.9 % injection 3 mL  3 mL Intravenous Q12H Juliet Rude, MD   3 mL at 03/08/16 2331  . traMADol (ULTRAM) tablet 50 mg  50 mg Oral Q6H PRN Juliet Rude, MD   50 mg at 03/09/16 [id number]     Discharge Medications: Please see discharge summary for a list of discharge medications.  Relevant Imaging Results:  Relevant Lab Results:   Additional Information SS#: [id number]  Samule Dry, LCSW

## 2016-03-09 NOTE — Progress Notes (Signed)
Report called to [facility]

## 2016-03-12 LAB — CULTURE, BLOOD (ROUTINE X 2)
CULTURE: NO GROWTH
Culture: NO GROWTH

## 2016-03-12 NOTE — Progress Notes (Signed)
Physical Therapy Evaluation Addendum for G-codes    2016-03-19 1408  PT G-Codes **NOT FOR INPATIENT CLASS**  Functional Assessment Tool Used clinical judgement  Functional Limitation Changing and maintaining body position  Changing and Maintaining Body Position Current Status [id number] CL  Changing and Maintaining Body Position Goal Status [id number] CJ    03/12/2016 Barry Brunner, PT Pager: [phone]

## 2016-04-06 DEATH — deceased

## 2016-05-31 ENCOUNTER — Encounter: Payer: Self-pay | Admitting: Vascular Surgery

## 2016-06-06 ENCOUNTER — Ambulatory Visit: Payer: Medicare Other | Admitting: Vascular Surgery

## 2016-06-06 ENCOUNTER — Encounter (HOSPITAL_COMMUNITY): Payer: Medicare Other

## 2017-08-03 IMAGING — CR DG FOOT 2V*L*
2 series · 2 of 2 positions shown · non-contrast
Comparison: No priors.

CLINICAL DATA: 81-year-old female with redness and swelling of the
left foot and left tibia/ fibula. Wound on the lateral aspect of the
left foot.

EXAM:
LEFT FOOT - 2 VIEW

[foot ap]
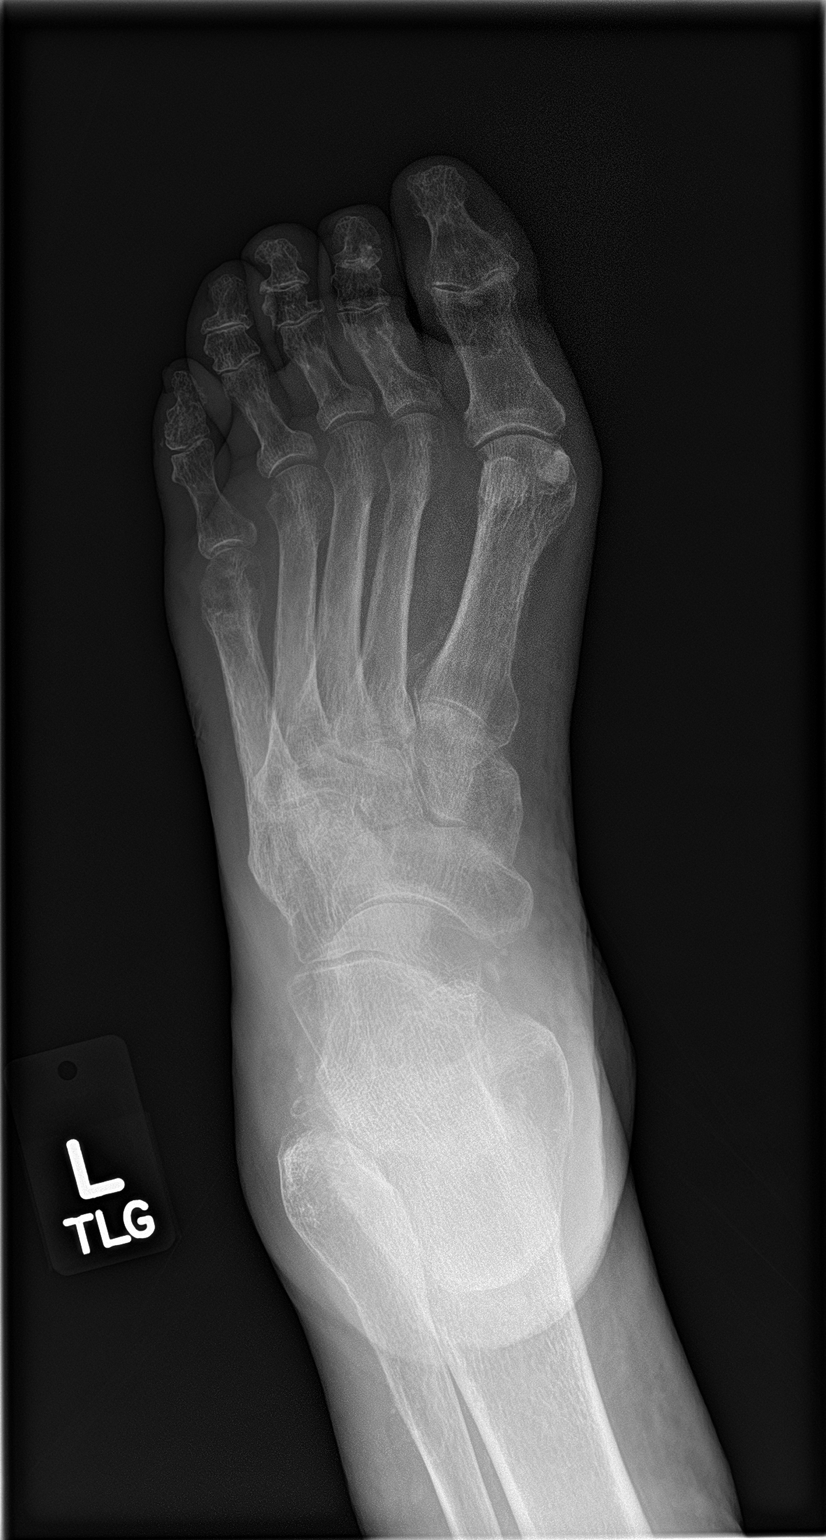

[foot lat]
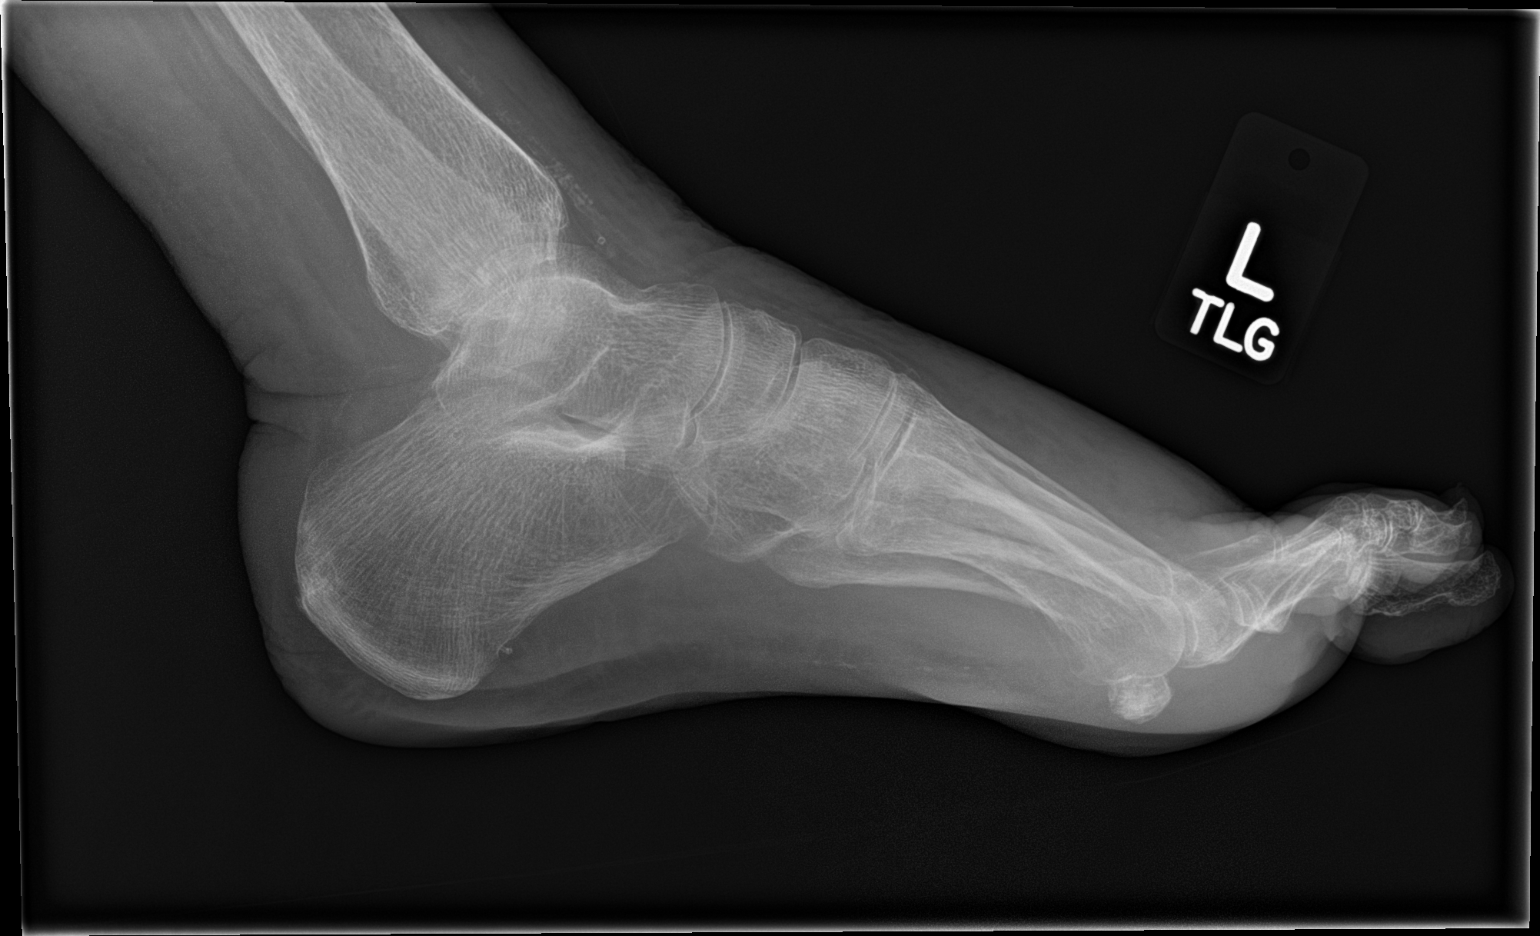

[2 of 2 positions shown; findings below may reference images not displayed]

FINDINGS: Mild osteopenia. No focal aggressive areas of osteolysis identified.
No acute displaced fractures. Multifocal degenerative changes of
osteoarthritis. Numerous vascular calcifications. Soft tissue
swelling overlying the forefoot.
IMPRESSION: 1. No acute osseous abnormality in the left foot.
2. Diffuse soft tissue swelling most pronounced over the forefoot.
3. Atherosclerosis.
4. Mild osteopenia.

## 2017-08-03 IMAGING — CR DG CHEST 1V PORT
1 series · 1 of 1 positions shown · non-contrast
Comparison: Chest x-ray 05/31/2015.

CLINICAL DATA: 81-year-old female status post PICC placement.
History of MR DON LOLITO infection.

EXAM:
PORTABLE CHEST 1 VIEW

[AP]
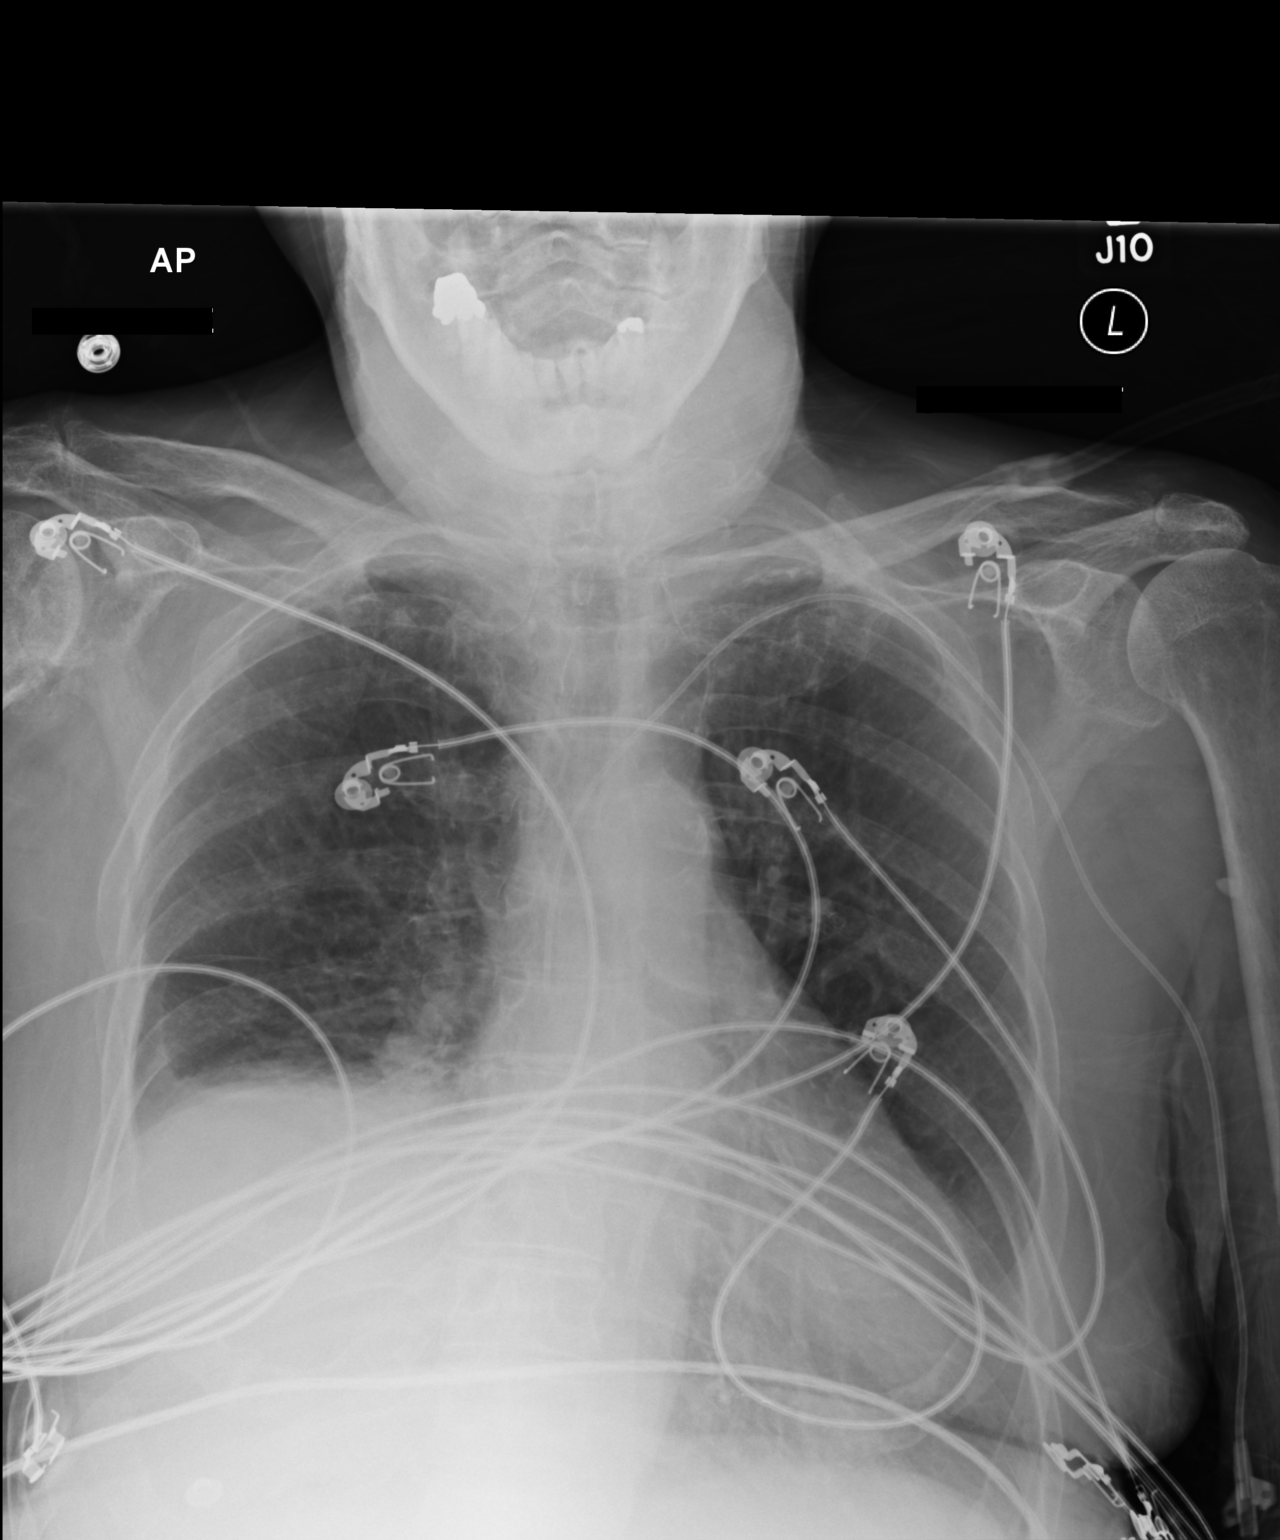

[1 of 1 positions shown; findings below may reference images not displayed]

FINDINGS: New left upper extremity PICC with tip terminating the distal
superior vena cava. Chronically elevated right hemidiaphragm.
Opacities at the right lung base likely reflect atelectasis and some
chronic scarring. Possible small right pleural effusion (likely
chronic). Left lung appears clear. No pleural or church and no
evidence of pulmonary edema. Heart size is mildly enlarged. No
pneumothorax. Upper mediastinal contours are within normal limits.
Mild bilateral apical pleuroparenchymal thickening and
calcification, most compatible with chronic post infectious or
inflammatory scarring. Atherosclerosis in the thoracic aorta.
IMPRESSION: 1. Tip of PICC is in the distal superior vena cava.
2. No pneumothorax.
3. Chronically elevated right hemidiaphragm with probable right
basilar atelectasis and/or scarring, similar to prior studies.
4. Mild cardiomegaly.
5. Atherosclerosis.

## 2018-03-04 IMAGING — DX DG KNEE COMPLETE 4+V*R*
4 series · 4 of 4 positions shown · non-contrast
Comparison: Right tibia/ fibula radiographs 12/16/2015. Right knee
radiographs 09/12/2012.

CLINICAL DATA: Bilateral knee pain and swelling for 2 weeks, right
worse than left. Skin redness.

EXAM:
RIGHT KNEE - COMPLETE 4+ VIEW

[knee ap]
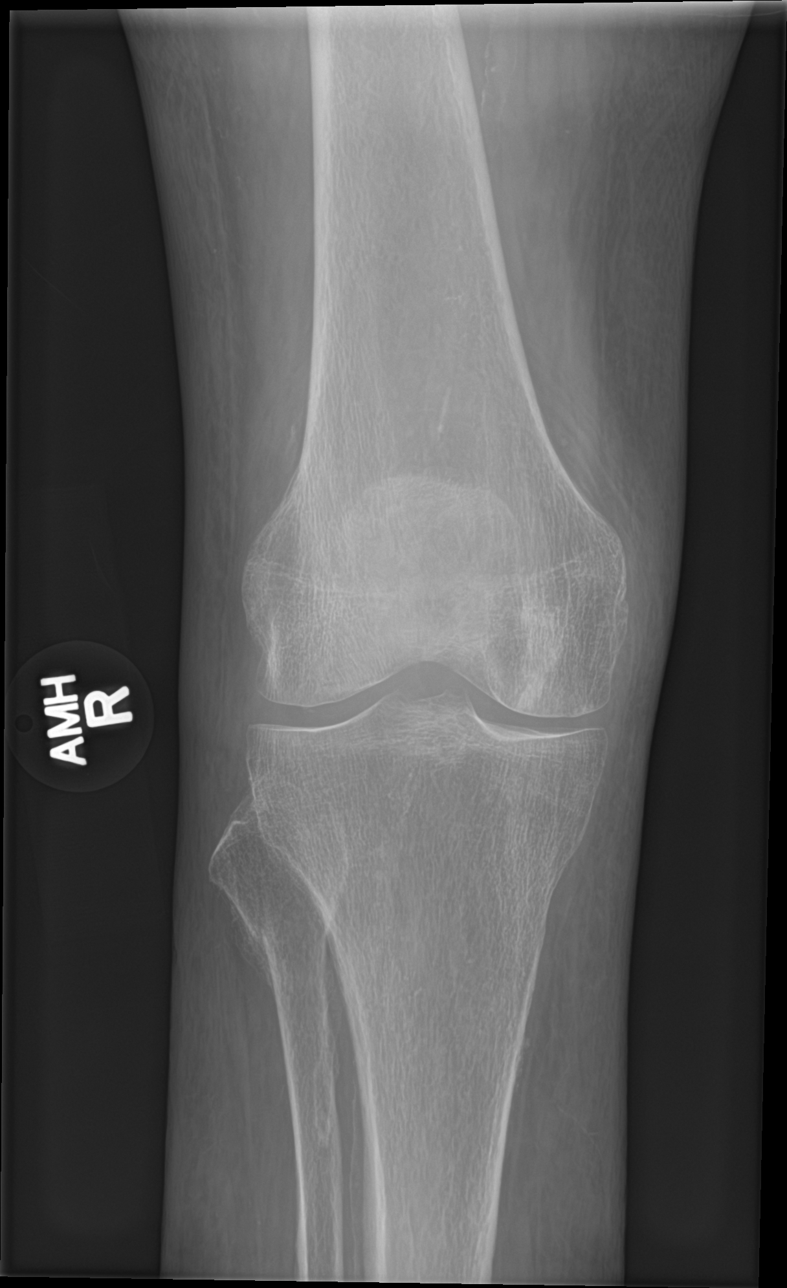

[knee lat]
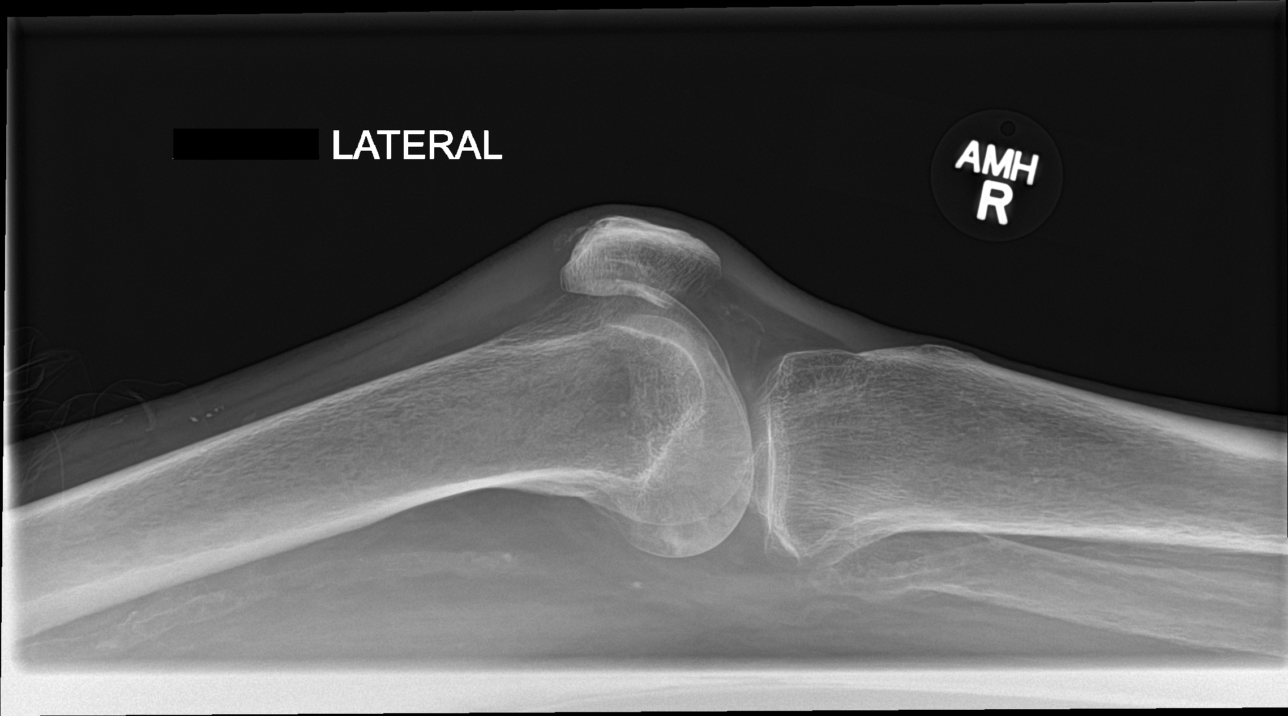

[knee obl (1 of 2)]
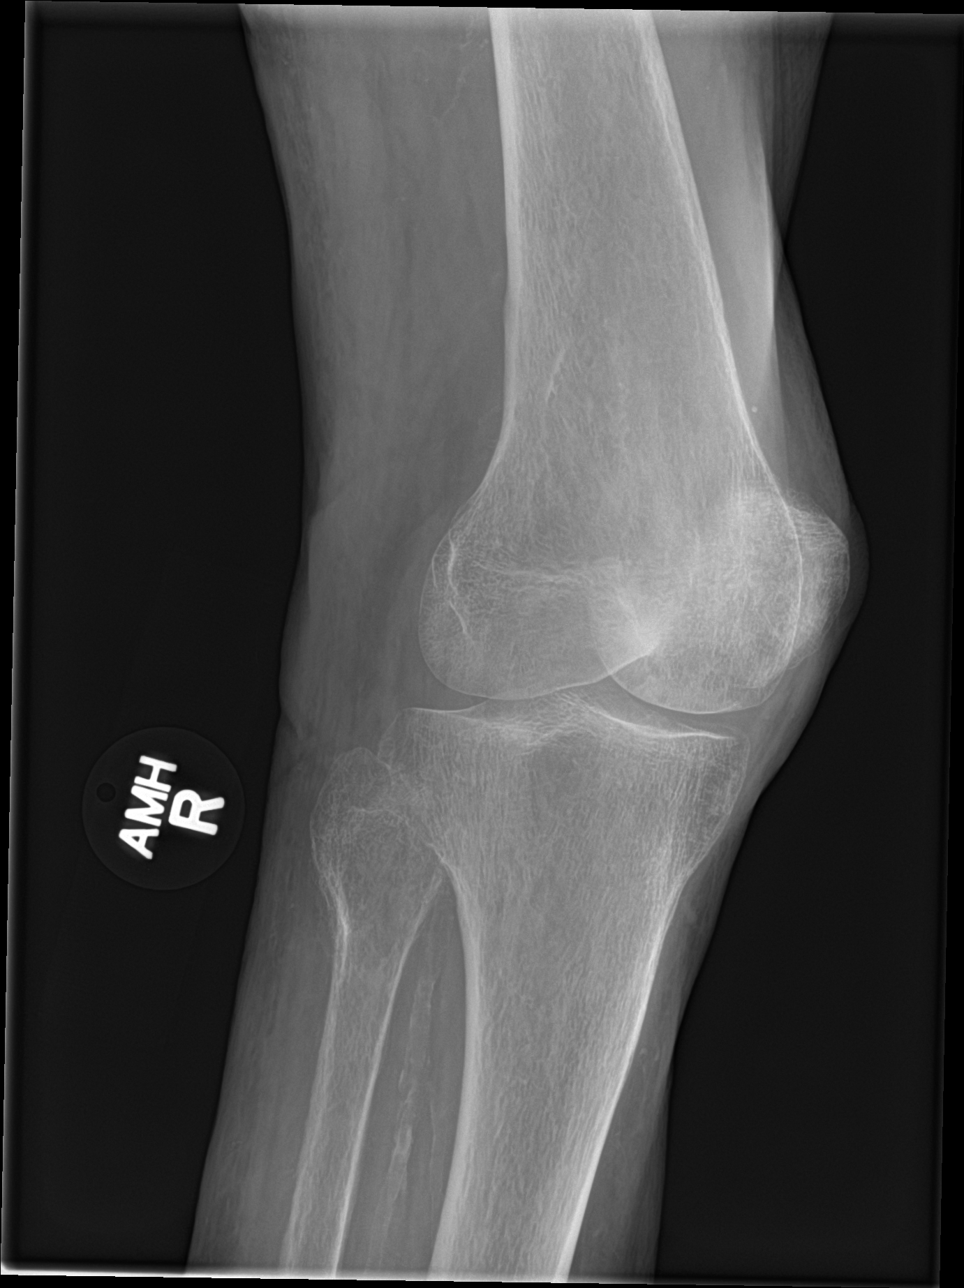

[knee obl (2 of 2)]
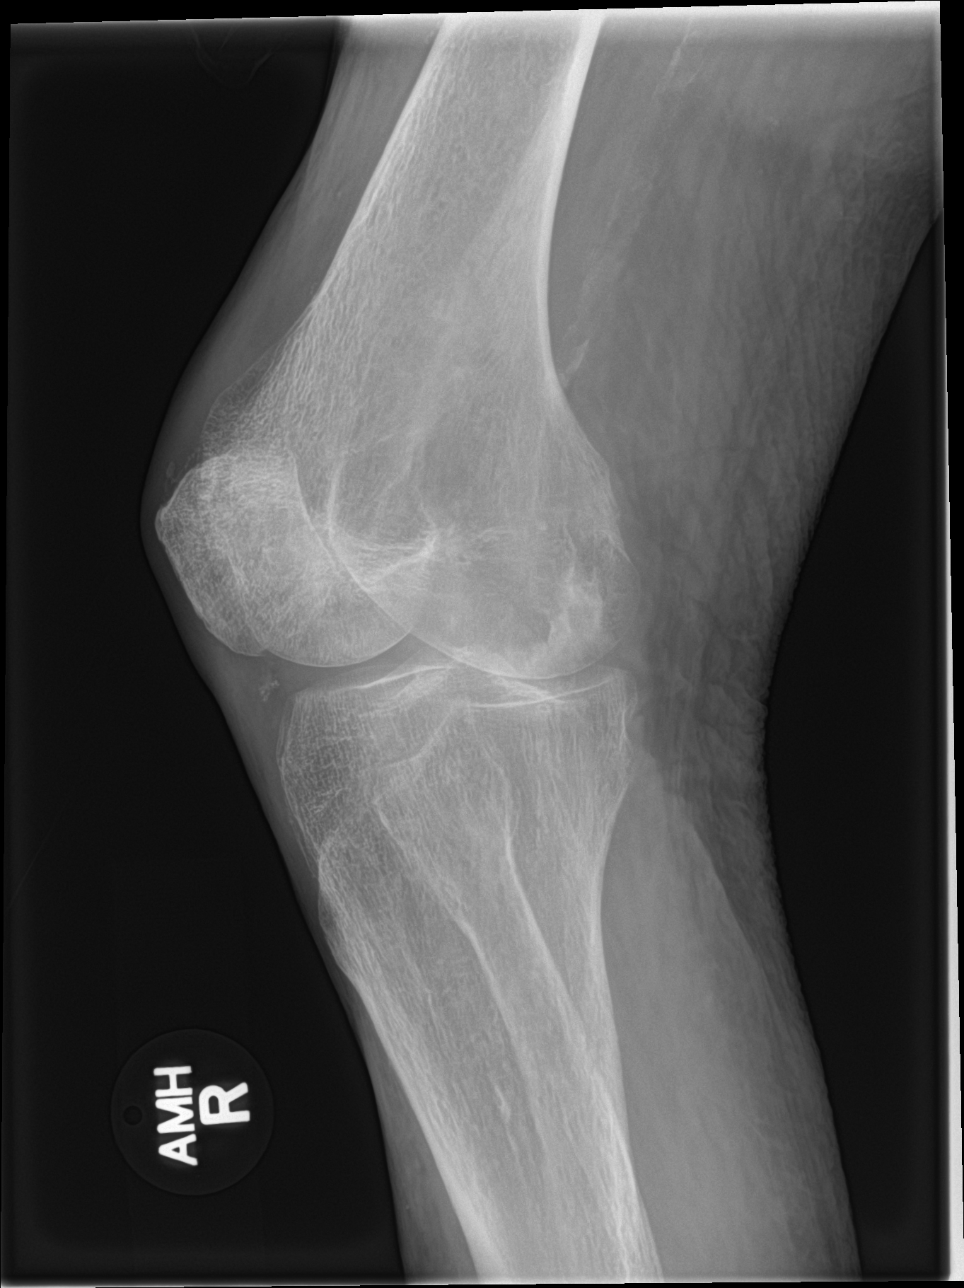

[4 of 4 positions shown; findings below may reference images not displayed]

FINDINGS: No acute fracture, dislocation, or sizable knee joint effusion is
identified. There is mild diffuse soft tissue thickening along the
anterior aspect of the knee. There is at most mild medial
compartment joint space narrowing. The bones are osteopenic.
Vascular calcifications are noted.
IMPRESSION: Anterior knee soft tissue thickening, nonspecific. No acute osseous
abnormality identified.
